# Patient Record
Sex: Female | Born: 1965 | Race: White | Hispanic: No | Marital: Single | Smoking: Never smoker
Health system: Southern US, Community
[De-identification: ages and names within clinical notes are randomized; demographics above are authoritative.]

## PROBLEM LIST (undated history)

## (undated) DIAGNOSIS — R519 Headache, unspecified: Secondary | ICD-10-CM

## (undated) DIAGNOSIS — G709 Myoneural disorder, unspecified: Secondary | ICD-10-CM

## (undated) DIAGNOSIS — G4733 Obstructive sleep apnea (adult) (pediatric): Secondary | ICD-10-CM

## (undated) DIAGNOSIS — R000 Tachycardia, unspecified: Secondary | ICD-10-CM

## (undated) DIAGNOSIS — IMO0001 Reserved for inherently not codable concepts without codable children: Secondary | ICD-10-CM

## (undated) DIAGNOSIS — F419 Anxiety disorder, unspecified: Secondary | ICD-10-CM

## (undated) DIAGNOSIS — Z8739 Personal history of other diseases of the musculoskeletal system and connective tissue: Secondary | ICD-10-CM

## (undated) DIAGNOSIS — T7840XA Allergy, unspecified, initial encounter: Secondary | ICD-10-CM

## (undated) DIAGNOSIS — K219 Gastro-esophageal reflux disease without esophagitis: Secondary | ICD-10-CM

## (undated) DIAGNOSIS — D689 Coagulation defect, unspecified: Secondary | ICD-10-CM

## (undated) DIAGNOSIS — F329 Major depressive disorder, single episode, unspecified: Secondary | ICD-10-CM

## (undated) DIAGNOSIS — M1990 Unspecified osteoarthritis, unspecified site: Secondary | ICD-10-CM

## (undated) DIAGNOSIS — E669 Obesity, unspecified: Secondary | ICD-10-CM

## (undated) DIAGNOSIS — F32A Depression, unspecified: Secondary | ICD-10-CM

## (undated) DIAGNOSIS — G4730 Sleep apnea, unspecified: Secondary | ICD-10-CM

## (undated) DIAGNOSIS — J189 Pneumonia, unspecified organism: Secondary | ICD-10-CM

## (undated) DIAGNOSIS — F319 Bipolar disorder, unspecified: Secondary | ICD-10-CM

## (undated) DIAGNOSIS — R739 Hyperglycemia, unspecified: Secondary | ICD-10-CM

## (undated) DIAGNOSIS — R7303 Prediabetes: Secondary | ICD-10-CM

## (undated) DIAGNOSIS — E119 Type 2 diabetes mellitus without complications: Secondary | ICD-10-CM

## (undated) DIAGNOSIS — R011 Cardiac murmur, unspecified: Secondary | ICD-10-CM

## (undated) DIAGNOSIS — E282 Polycystic ovarian syndrome: Secondary | ICD-10-CM

## (undated) DIAGNOSIS — D649 Anemia, unspecified: Secondary | ICD-10-CM

## (undated) DIAGNOSIS — R0789 Other chest pain: Principal | ICD-10-CM

## (undated) HISTORY — DX: Obesity, unspecified: E66.9

## (undated) HISTORY — PX: FOOT SURGERY: SHX648

## (undated) HISTORY — DX: Polycystic ovarian syndrome: E28.2

## (undated) HISTORY — DX: Reserved for inherently not codable concepts without codable children: IMO0001

## (undated) HISTORY — DX: Bipolar disorder, unspecified: F31.9

## (undated) HISTORY — DX: Personal history of other diseases of the musculoskeletal system and connective tissue: Z87.39

## (undated) HISTORY — DX: Obstructive sleep apnea (adult) (pediatric): G47.33

## (undated) HISTORY — PX: SPINAL FUSION: SHX223

## (undated) HISTORY — DX: Cardiac murmur, unspecified: R01.1

## (undated) HISTORY — DX: Sleep apnea, unspecified: G47.30

## (undated) HISTORY — DX: Depression, unspecified: F32.A

## (undated) HISTORY — DX: Coagulation defect, unspecified: D68.9

## (undated) HISTORY — DX: Essential (primary) hypertension: I10

## (undated) HISTORY — DX: Major depressive disorder, single episode, unspecified: F32.9

## (undated) HISTORY — DX: Unspecified asthma, uncomplicated: J45.909

## (undated) HISTORY — PX: COLONOSCOPY: SHX174

## (undated) HISTORY — DX: Tachycardia, unspecified: R00.0

## (undated) HISTORY — DX: Anemia, unspecified: D64.9

## (undated) HISTORY — DX: Other chest pain: R07.89

## (undated) HISTORY — DX: Hyperglycemia, unspecified: R73.9

## (undated) HISTORY — DX: Gastro-esophageal reflux disease without esophagitis: K21.9

## (undated) HISTORY — DX: Allergy, unspecified, initial encounter: T78.40XA

---

## 1994-07-07 HISTORY — PX: TONSILLECTOMY: SHX5217

## 1996-07-07 HISTORY — PX: OVARIAN CYST REMOVAL: SHX89

## 1999-11-21 ENCOUNTER — Other Ambulatory Visit: Admission: RE | Admit: 1999-11-21 | Discharge: 1999-11-21 | Payer: Self-pay | Admitting: Obstetrics & Gynecology

## 2002-07-07 HISTORY — PX: NASAL SINUS SURGERY: SHX719

## 2004-06-17 ENCOUNTER — Encounter: Admission: RE | Admit: 2004-06-17 | Discharge: 2004-06-17 | Payer: Self-pay | Admitting: Orthopaedic Surgery

## 2004-07-17 ENCOUNTER — Inpatient Hospital Stay (HOSPITAL_COMMUNITY): Admission: RE | Admit: 2004-07-17 | Discharge: 2004-07-23 | Payer: Self-pay | Admitting: Orthopaedic Surgery

## 2004-07-17 ENCOUNTER — Encounter: Payer: Self-pay | Admitting: Orthopaedic Surgery

## 2004-09-05 ENCOUNTER — Inpatient Hospital Stay (HOSPITAL_COMMUNITY): Admission: EM | Admit: 2004-09-05 | Discharge: 2004-09-11 | Payer: Self-pay | Admitting: Emergency Medicine

## 2004-09-26 ENCOUNTER — Ambulatory Visit (HOSPITAL_COMMUNITY): Admission: RE | Admit: 2004-09-26 | Discharge: 2004-09-26 | Payer: Self-pay | Admitting: Orthopaedic Surgery

## 2005-07-07 HISTORY — PX: KNEE SURGERY: SHX244

## 2006-09-17 ENCOUNTER — Ambulatory Visit (HOSPITAL_COMMUNITY): Admission: RE | Admit: 2006-09-17 | Discharge: 2006-09-18 | Payer: Self-pay | Admitting: Orthopaedic Surgery

## 2006-09-24 ENCOUNTER — Ambulatory Visit (HOSPITAL_COMMUNITY): Admission: RE | Admit: 2006-09-24 | Discharge: 2006-09-24 | Payer: Self-pay | Admitting: Orthopaedic Surgery

## 2006-11-20 ENCOUNTER — Encounter: Admission: RE | Admit: 2006-11-20 | Discharge: 2006-11-20 | Payer: Self-pay | Admitting: Family Medicine

## 2008-02-22 ENCOUNTER — Ambulatory Visit (HOSPITAL_COMMUNITY): Admission: RE | Admit: 2008-02-22 | Discharge: 2008-02-22 | Payer: Self-pay | Admitting: Anesthesiology

## 2008-03-07 ENCOUNTER — Encounter: Admission: RE | Admit: 2008-03-07 | Discharge: 2008-03-07 | Payer: Self-pay | Admitting: Family

## 2008-11-21 ENCOUNTER — Encounter: Admission: RE | Admit: 2008-11-21 | Discharge: 2008-11-21 | Payer: Self-pay | Admitting: Anesthesiology

## 2009-04-09 ENCOUNTER — Encounter: Admission: RE | Admit: 2009-04-09 | Discharge: 2009-04-09 | Payer: Self-pay | Admitting: Family Medicine

## 2010-01-09 ENCOUNTER — Encounter: Admission: RE | Admit: 2010-01-09 | Discharge: 2010-01-09 | Payer: Self-pay | Admitting: Anesthesiology

## 2010-05-28 ENCOUNTER — Encounter: Admission: RE | Admit: 2010-05-28 | Discharge: 2010-05-28 | Payer: Self-pay | Admitting: Family Medicine

## 2010-11-22 NOTE — Op Note (Signed)
NAMEHILDAGARDE, [name]              ACCOUNT NO.:  [id number]   MEDICAL RECORD NO.:  [id number]          PATIENT TYPE:  OIB   LOCATION:  5018                         FACILITY:  [facility]   PHYSICIAN:  Sharolyn Douglas, M.D.        DATE OF BIRTH:  02/16/1966   DATE OF PROCEDURE:  09/17/2006  DATE OF DISCHARGE:                               OPERATIVE REPORT   DIAGNOSES:  1. Lumbar degenerative disk disease.  2. Post laminectomy pain syndrome.  3. Back and bilateral lower extremity pain.   PROCEDURES:  1. T9-10 thoracic laminotomy for placement of permanent trial spinal      cord stimulator lead.  2. Programming of spinal cord stimulator lead.  3. Fluoroscopy used for placement of permanent trial spinal cord      stimulator lead.   SURGEON:  Sharolyn Douglas, MD.   ASSISTANTJill Side Mahar, PA   ANESTHESIA:  General endotracheal.   ESTIMATED BLOOD LOSS:  Minimal.   COMPLICATIONS:  None.   SPONGE AND NEEDLE COUNT:  Correct.   INDICATIONS:  The patient is a pleasant 45 year old female with chronic  disabling back and bilateral lower extremity pain.  She has failed all  attempts at other treatment measures and now presents for placement of  permanent trial spinal cord stimulator lead in hopes of improving her  symptoms.  This is stage one of a two-part staged procedure.   PROCEDURE:  After informed consent, she was taken to the operating room,  underwent general endotracheal anesthesia without difficulty, given  prophylactic IV antibiotics.  Carefully turned prone onto the Wilson  frame, all bony prominences padded, face and eyes protected at all  times.  Back prepped and draped, usual sterile fashion.  Fluoroscopy  brought into the field.  The T9-10 interspace was identified.  A 4 cm  incision was made over the interspace.  Dissection was carried through  the deep fascia, subperiosteal exposure carried out.  Deep retractors  placed.  Laminectomy was then completed at T9-10 by removing the  interspinous ligament.  The ligamentum flavum was removed piecemeal  using thin footed Kerrison punches.  The [employer] dual-paddle lead was  then passed into the spinal canal and slid proximally.  Fluoroscopy was  used to confirm good positioning in the midline with the tip at the  midportion of the T8 vertebral body.  We then tested impedance.  We  obtained adequate readings.  The anchor was then attached to the lead  using a 3-0 Prolene suture.  The lead was sutured to the interspinous  ligament of T9.  Extension leads were then placed and externalized out  towards the right shoulder using the tunneling tool.  The deep fascia  was closed with interrupted 0 Vicryl suture.  The remaining lead was  then placed into the incision and the skin was closed tightly using 3-0  nylon suture.  Sterile bandages were placed.  The patient was turned  supine, extubated without difficulty, and transferred to recovery in  stable condition.   We then programmed her stimulator along with the representative from  [employer] in the  recovery room, obtaining coverage in the back and  bilateral legs.  Further programming will be carried out in the hospital  before discharge.   It should be noted my assistant [facility], PA, was present  throughout the procedure including the positioning, the exposure, the  laminotomy, and also placement of the lead and wound closure.      Sharolyn Douglas, M.D.  Electronically Signed     MC/MEDQ  D:  09/17/2006  T:  09/19/2006  Job:  [id number]

## 2010-11-22 NOTE — Discharge Summary (Signed)
NAMESHAKESHA, [name]              ACCOUNT NO.:  [id number]   MEDICAL RECORD NO.:  [id number]          PATIENT TYPE:  INP   LOCATION:  5033                         FACILITY:  [facility]   PHYSICIAN:  Sharolyn Douglas, M.D.        DATE OF BIRTH:  1966/04/24   DATE OF ADMISSION:  07/17/2004  DATE OF DISCHARGE:  07/23/2004                                 DISCHARGE SUMMARY   ADMITTING DIAGNOSES:  1.  L4-L5, L5-S1 degenerative disk disease.  2.  Hypertension.  3.  Depression.  4.  Reflux.   DISCHARGE DIAGNOSES:  1.  Status post L4-S1 posterior spinal fusion, doing well.  2.  Postoperative anemia.  3.  Postoperative hyperglycemia.  4.  Hypertension.  5.  Depression.  6.  Reflux.   PROCEDURE:  On July 17, 2004 patient was taken to operating room for L4-  S1 posterior spinal fusion with pedicle screws TLIF BMP.  The surgeon was  Sharolyn Douglas, M.D.  Assistant was [employer], P.A.-C.  Anesthesia used was  general.   CONSULTS:  None.   LABORATORIES:  On July 15, 2004 CBC with differential was within normal  limits, postoperatively a hemoglobin and hematocrit were monitored daily,  she reached a low of 10.4 and 29.5 on July 19, 2004 however, she did not  require any treatment.  PT, INR and PTT on July 15, 2004 were within  normal limits.  Complete metabolic panel from July 15, 2004 was normal  with the exception of glucose slightly decreased at 69.  UA was negative  with the exception of many epithelial's.  Blood typing was type O, Rh type  negative, antibody screen negative.  Basic metabolic panel from July 18, 2004 was within normal limits with the exception of glucose at 129 and BUN  of 4.   CT of the lumbar spine on July 17, 2004 shows postoperative lumbar  fixation, does not demonstrate any complicating features with the pedicle  screw placement nor is there is any evidence of foraminal compromise or  postoperative complicating features.  Lumbar spine x-rays from July 17, 2004 shows L4-S1 posterior spinal fusion.  EKG from July 15, 2004 shows  normal sinus rhythm, minimal voltage criteria for LVH __________ variant, no  previous tracing, read by Lady Deutscher.   BRIEF HISTORY:  Patient is a 45 year old female who has had previous back  surgery at L5-S1 which was a microdiskectomy unfortunately had persistent  pain and severe low back pain and numbness into the right lower extremity.  She has been unable to work since that previous surgery.  Unfortunately she  has tried numerous types of conservative treatment since that time to  control her pain and this has not worked.  Because of her MRI findings as  well as her physical exam findings about her best course of management at  this point would be an L4-S1 posterior spinal fusion.  The risks and  benefits of this procedure were discussed with the patient by Dr. Noel Gerold as  well as myself and she indicated understanding and opts to proceed.  HOSPITAL COURSE:  On July 17, 2004 patient was taken to the operating  room for the above listed procedure.  She tolerated procedure well without  any intraoperative complications.  There was one Hemovac drained placed  intraoperatively with 600 mL of blood loss and 250 return via cell saver.  She was transferred to the recovery room in stable condition.  Postoperatively she did break with left lower extremity pain which her  preoperative right lower extremity pain was completely resolved.  She felt  she was having some weakness as well.  Tibialis anterior strength on the  left was 4/5, EHL strength was 4/5 while on the right lower extremity she  was 5/5.  She also had decreased sensation in the left lower extremity in  the L5 distribution.  Because of these findings a CT scan of the lumbar  spine was ordered to confirm screw placement.  This came back as previously  dictated without any foraminal compromise and they seem to be well placed.  By the following day  her left bilateral lower extremity pain had resolved  and her strength was slowly improving.  She was having back pain as  expected.   Postoperatively retained orthopedic spine protocol was followed including  holding her diet at n.p.o. until she passed flatus at which time she was  slowly advanced to a regular diet.  Incentive spirometer was used q.1h.  Hemovac drain was removed on postoperative day #2 without any difficulty.  Foley was kept in for 2 days as well and was removed on postoperative day  #2.  TED hose, SCD as well as early mobilization for DVT prophylaxis.  Physical therapy and occupational therapy were consulted to work with  patient on progressive ambulation program.  She was to use her LSO brace  when she is out of bed.  Forty eight hours prophylactic antibiotics,  morphine PCA as well as Vicodin as needed for pain, home medications were  restarted.   By postoperative day #2 she had progressed along and was feeling much  better.  Again, her leg pain continued to be resolved.  She was having  normal strength and function at that point.  Physical therapy and  occupational therapy worked with her on a daily basis.  By July 22, 2004  patient was doing very well and by July 23, 2004 she had met all  orthopedic goals, medically she was doing very well, she was stable and  ready for discharge home with home health physical therapy and occupational  therapy as well as her family's assistance.   DISCHARGE PLAN:  Patient a 45 year old female status post L4-S1 posterior  spinal fusion doing well.  Activity is daily ambulation, brace on when she  is up, no lifting heavier than 5 pounds, back precautions at all times,  daily dressing changes, may shower.  Follow up 2 weeks postoperatively with  Dr. Noel Gerold.  Diet resume home diet.  Medication is Vicodin p.r.n. pain, Robaxin p.r.n. muscle spasm, multivitamin daily, calcium daily, Colace twice  daily, laxative as needed and  resume home meds.  Diet regular home diet.   CONDITION ON DISCHARGE:  Stable, improved.   DISPOSITION:  Patient to be discharged to her home with the family  assistance as well as home health physical therapy and occupational therapy.      CM/MEDQ  D:  10/16/2004  T:  10/16/2004  Job:  [id number]

## 2010-11-22 NOTE — Op Note (Signed)
[name], [name]              ACCOUNT NO.:  [id number]   MEDICAL RECORD NO.:  [id number]          PATIENT TYPE:  INP   LOCATION:  2550                         FACILITY:  [facility]   PHYSICIAN:  Sharolyn Douglas, M.D.        DATE OF BIRTH:  04/30/1966   DATE OF PROCEDURE:  07/17/2004  DATE OF DISCHARGE:                                 OPERATIVE REPORT   DIAGNOSIS:  Lumbar degenerative disk disease L4-5, L5-S1 with concordant  pain and recurrent disk rupture.   PROCEDURE:  1.  Revision lumbar laminectomy, L5-S1 with decompression of the right L5      and S1 nerve roots.  2.  Transforaminal lumbar interbody fusion, L4-5 and L5-S1 with placement of      two PEAK cages packed with local autogenous bone graft and bone      morphogenic protein.  3.  Segmental pedicle screw instrumentation, L4 through S1 using the Spinal      Concepts system.  4.  Posterior spinal arthrodesis, L4 through S1.  5.  Local autogenous bone graft supplemented with bone morphogenic protein      and Grafton allograft.  6.  Neuro monitoring with testing of six pedicle screws, monitoring of free      running EMGs times 3 hours.   SURGEON:  Sharolyn Douglas, M.D.   ASSISTANT:  Verlin Fester, P.A.   ANESTHESIA:  General endotracheal.   COMPLICATIONS:  None.   INDICATIONS:  The patient is a very pleasant 45 year old female with chronic  disabling back and bilateral lower extremity pain and weakness. She had a  previous L5-S1 microdiskectomy. Postoperatively, she has had progressively  worsening back and bilateral leg pain, right greater than left. Diskography  demonstrated concordant pain response at L4-5 and L5-S1. MRI scan shows  recurrent disk rupture at L5-S1. Because of her severe persistent symptoms  unresponsive to all conservative care, she has elected to undergo lumbar  decompression and fusion L4 through S1. She knows the risks and benefits.   PROCEDURE:  The patient was properly identified in the holding area,  taken  to the operating room and under general orotracheal anesthesia without  difficulty, given prophylactic IV antibiotics. Carefully positioned prone on  the Wilson frame. All bony processes were padded. Face protected at all  times. Back prepped, draped usual sterile fashion. The previous midline  incision was utilized and extended several inches proximally. Dissection was  carried sharply to the deep fascia and previous scar. Paraspinal muscles  elevated out over the transverse processes of L4, L5 and sacral ala  bilaterally. The bone anatomy was defined. The previous laminotomy defects  were defined bilaterally at L5-S1. Care was taken not to inadvertently enter  the spinal canal. Deep retractors were placed. We then turned our attention  to performing a revision laminectomy at L5-S1. The thecal sac was  identified. The epidural fibrosis was carefully dissected. We identified the  right L5 and S1 nerve roots which were scarred and being displaced  posteriorly by a disk rupture. The thecal sac was gently mobilized and the  disk space entered. A subligamentous  disk herniation was then removed which  immediately allowed the thecal sac to become more mobile. There was  significant scar noted about the nerve roots bilaterally. We then turned our  attention to placing pedicle screws at L4, L5 and S1 bilaterally using  anatomic probing technique. Each pedicle starting point was identified  initiated with the awl. The pedicle was cannulated and then palpated to rule  out any breeches. The pedicle was tapped and then once again palpated. There  were no breeches.  We utilized 6.5 x 50 mm screws at L4, 6.5 x 45 mm screws  at L5 and 7.5 x 35 mm screws in the sacrum. We had good screw purchase after  placing each pedicle screw. The screws were stimulated using triggered EMGs  and there were no deleterious changes. We then turned our attention to  completing transforaminal lumbar interbody fusions  on the right at L4-5 and  L5-S1. Facetectomies were completed.  The exiting and traversing nerve roots  were identified and protected at all times.  Free running EMGs monitored no  deleterious changes.  Starting at L4-5, the disk space was entered. Radical  diskectomy carried out across the contralateral side, cartilaginous  endplates scraped . The disk was distracted. We packed the disk space with  local autogenous bone graft obtained from the laminectomy and facetectomy.  The BMP sponge from the medium InFuse kit was then pushed anteriorly. We  inserted a 12 mm peak cage packed with a BMP sponge in an oblique fashion  into the L4-5 interspace. We felt we had good distraction. We carried  similar procedure at L5-S1.  At this level the disk space was severely  collapsed. Wide foraminotomy completed. Again it was noted that the L5 and  S1 roots were scarred and neurolysis was completed. An 8 mm peak cage placed  at L5-S1. We then turned our attention to completing the posterior spinal  arthrodesis, L4 through S1 bilaterally.  High-speed bur used to decorticate  the transverse processes and remaining pars interarticularis. Remaining bone  graft was then packed into the lateral gutters and supplemented with 15 cc  of Grafton allograft.  Short segment titanium rods placed into the polyaxial  screw heads.  Gentle compression applied before shearing off the locking  caps. A cross connector was applied. Deep Hemovac drain left in place. The  deep fascia closed with a running #1 Vicryl suture. Subcutaneous layer  closed with 0 Vicryl, 2-0 Vicryl and then a running 3-0 subcuticular Vicryl  suture on the skin edges. Benzoin, Steri-Strips placed. Sterile dressing  applied. The patient was extubated without difficulty and transferred to  recovery in stable condition.      MC/MEDQ  D:  07/17/2004  T:  07/17/2004  Job:  [id number]

## 2010-11-22 NOTE — H&P (Signed)
[name], [name]              ACCOUNT NO.:  [id number]   MEDICAL RECORD NO.:  [id number]          PATIENT TYPE:  INP   LOCATION:  0481                         [facility]AC[facility]   PHYSICIAN:  Georges Lynch. Gioffre, M.D.DATE OF BIRTH:  08/21/1965   DATE OF ADMISSION:  09/04/2004  DATE OF DISCHARGE:                                HISTORY & PHYSICAL   HISTORY OF PRESENT ILLNESS:  I got called late last evening, midnight to see  Ms. [name] in regards to an injury she had when she fell and injured her  right knee.  Apparently, her left leg had been somewhat weak she says and  she recently has had spinal fusion done seven weeks ago and she fell and  injured her right knee.  She has also complained of some pain about the  right ankle.   PAST MEDICAL HISTORY:  All well documented.   ALLERGIES:  She is allergic to tape.   CURRENT MEDICATIONS:  1.  Nexium 40 mg daily.  2.  Trazodone 100 mg hours of sleep.  3.  Wellbutrin XL 300 mg daily.  4.  Ortho-Tri-Cyclen.   PAST SURGICAL HISTORY:  As mentioned above, spinal fusion some weeks ago.   PHYSICAL EXAMINATION:  GENERAL: Alert and oriented, temperature is 97.8,  pulse rate 84, respirations 20, blood pressure is 144/77.   HEENT: The exam of the head and neck were negative. Mouth was clear, no  lesions noted.   EXTREMITIES: Back as I mentioned she had a previous spinal fusion.  Her  upper extremities were negative.  Left lower extremity, she had no evidence  of any fractures or dislocation, circulation was intact.  Right lower  extremity had pain and swelling of the right knee.  Calf was fine, no  phlebitis.  She had a good dorsalis pedis pulse.  She had dorsiflexion and  plantar flexion and flexed her foot well on the right.  Sensation was  intact.   IMAGING STUDIES:  X-rays of her right knee showed a tibial plateau fracture,  nondisplaced, we did a CT scan of her knee that shows multiple small  fragments in regard to the tibial  plateau of the right knee.   LABORATORY DATA:  Her hemoglobin was 12.5, hematocrit was 35.7, white count  12,900, platelets were 349,000 which were normal.  Her sodium was 135,  potassium 3.8, chloride 104, BUN 0.07, creatinine 7, glucose 104.  Alk phos  was 68, total bilirubin was 0.06.   ASSESSMENT AND PLAN:  But I see no reason at this time to do any open  reductions at this time on her. I think we will give her a chance to heal  this conservatively. She is 38.  We are going to treat her in a knee  immobilizer and I am going to put her on Lovenox and she had to be admitted  for pain control.      RAG/MEDQ  D:  09/05/2004  T:  09/05/2004  Job:  [id number]   cc:   Sharolyn Douglas, M.D.  [address]  [place]  [place] [address]  Fax:  [phone] 

## 2010-11-22 NOTE — Op Note (Signed)
NAMEELANORE, [name]              ACCOUNT NO.:  [id number]   MEDICAL RECORD NO.:  [id number]          PATIENT TYPE:  AMB   LOCATION:  SDS                          FACILITY:  [facility]   PHYSICIAN:  Sharolyn Douglas, M.D.        DATE OF BIRTH:  [birth year]   DATE OF PROCEDURE:  09/24/2006  DATE OF DISCHARGE:                               OPERATIVE REPORT   Please note this is stage 2 a staged procedure.   DIAGNOSIS:  Post laminectomy pain syndrome.   PROCEDURE:  1. Placement of internal pulse generator, right hip, for permanent      spinal cord stimulator.  2. Complex programming of spinal cord stimulator.  3. Trigger point injection left shoulder.   SURGEON:  Sharolyn Douglas, M.D.   ASSISTANT:  Colleen Mahar, P.A.-C.   ANESTHESIA:  General endotracheal.   ESTIMATED BLOOD LOSS:  Minimal.   COMPLICATIONS:  None.   INDICATIONS:  The patient is a pleasant 45 year old female with chronic  pain related to post laminectomy syndrome.  She had a permanent trial  spinal cord stimulator lead placed last week with good results.  She has  noted excellent coverage in the back and bilateral lower extremities.  She wishes to move forward with placement of the IPG.  She has had  severe pain in her left posterior shoulder, this is tender to palpation.  It is very muscular in origin.  She has requested a trigger point  injection be done while she is under anesthesia.  The risks, benefits,  and alternatives were reviewed.  She has elected to proceed with the  above procedures.   DESCRIPTION OF PROCEDURE:  The patient was identified in the holding  area, after informed consent, taken to the operating room.  She  underwent general endotracheal anesthesia without difficulty and given  prophylactic IV antibiotics.  She was carefully turned prone onto the  Wilson frame.  All bony prominences were padded.  The face and eyes were  protected at all times.  The back was prepped and draped in the usual  sterile  fashion.  The previous midline thoracic incision was opened.  Care was taken not to damage the underlying stimulator lead.  The  extension leads were disconnected.  The leads were cut and the extension  lead was pulled from under the drapes out of the patient's body.  We  then made a separate incision located over the left hip in a transverse  fashion.  A subcutaneous pocket was developed.  A tunneling tool was  used to connect the two incisions. The stimulator leads were then passed  distally.  The distal end of the stimulator leads were then attached to  the IPG.  The IPG was tested and there was acceptable impedance.  The  leads were tightened using the screwdriver.  The IPG was then placed  into the subcutaneous pocket.  The two incisions were closed using 2-0  Vicryl suture as well as a running 3-0 subcuticular Vicryl suture on the  skin edges.  Dermabond was applied to the wounds.   We then turned our  attention to performing a trigger point injection for  the left shoulder.  In the holding area, we had marked the point of  maximum tenderness.  4 mL of 1% lidocaine and 40 mg of Depo-Medrol were  injected intramuscularly.  The patient tolerated the procedure well.  She was extubated without difficulty and transferred to recovery in  stable condition neurologically intact.  She noted in the recovery room  that her shoulder was feeling better.  We then proceeded to program her  spinal cord stimulator along with the representative from [employer].   It should be noted my assistant, [employer], P.A.-C., was present  throughout the procedure including the positioning, the exposure,  placement of the IPG, and also with wound closure.      Sharolyn Douglas, M.D.  Electronically Signed     MC/MEDQ  D:  09/24/2006  T:  09/24/2006  Job:  [id number]

## 2010-11-22 NOTE — Discharge Summary (Signed)
[name], [name]              ACCOUNT NO.:  [id number]   MEDICAL RECORD NO.:  [id number]          PATIENT TYPE:  INP   LOCATION:  0481                         FACILITY:  [facility]   PHYSICIAN:  Georges Lynch. Gioffre, M.D.DATE OF BIRTH:  [birth year]   DATE OF ADMISSION:  09/04/2004  DATE OF DISCHARGE:  09/11/2004                                 DISCHARGE SUMMARY   ADMISSION DIAGNOSES:  1.  Nondisplaced tibial plateau fracture status post spinal fusion seven      weeks ago.  2.  Gastroesophageal reflux disease.  3.  Chronic back pain.   DISCHARGE DIAGNOSES:  1.  Nondisplaced fracture tibial plateau right knee.  2.  Gastroesophageal reflux disease.  3.  Recent spinal fusion.   PROCEDURE:  None.   BRIEF HISTORY:  This is a 45 year old female who presented to the emergency  department after she sustained a fall injuring her right knee.  Apparently,  her left leg has been somewhat weak since her recent spinal fusion in  January 2006.  The weakness in her leg caused her to fall injuring her right  knee.  She was seen by the emergency room physician and was found to have a  nondisplaced tibial plateau fracture and Dr. Darrelyn [name] was called for  admission.  The patient was in such severe pain it was felt that she needed  to be hospitalized for pain control.  Her nondisplaced fracture did not  require surgery.   LABORATORY DATA:  Admission CBC:  WBC 12.9, RBC 4.33, hemoglobin 12.5,  hematocrit 35.7, platelet count 349.  Admission chemistry:  Sodium 135,  potassium 3.8, chloride 104, CO2 26, glucose 104, BUN 7, creatinine 0.7,  calcium 9.5, albumin 3.1.  Admission x-ray of right knee shows nondisplaced  tibial plateau fracture extending to the intra-articular surface.  X-ray of  her right hip, no acute findings.  X-ray of her back showed L4 to S1 spinal  fusion.  No acute findings.  X-ray of her left ankle negative.  CT scan of  her right knee revealed several fracture planes tibial plateau.   The patient  developed urinary tract symptoms while in the hospital.  Urinalysis was  obtained and culture which grew out greater than 100 colonies of  Enterococcus and E. coli.  She was placed on antibiotics.  The patient was  placed on PC analgesia for pain control.  The patient's pain was controlled  and she was gradually weaned to oral analgesics.  PT was consulted for gait  training ambulation.  [facility].  The  patient stabilized.  The pain was well controlled and it was felt that she  could be discharged home for outpatient followup.   Discharge date September 11, 2004.   DISCHARGE MEDICATIONS:  1.  Cipro 500 mg twice daily for one week.  2.  Lovenox 50 mg subcu daily.  3.  OxyContin 20 mg twice daily as needed for pain.  4.  Valium 5 mg one every eight hours as needed for spasms.  5.  Dilaudid 2 mg one every three hours as needed  for breakthrough pain.   WEIGHTBEARING:  The patient is to nonweightbear of her right lower  extremity.  She should wear the knee immobilizer as well as her back brace.  [facility] will administer her Lovenox.   FOLLOW UP:  The patient will follow up with Dr. Darrelyn [name] in one week in the  office.  She will call to schedule the appointment.      LKP/MEDQ  D:  10/08/2004  T:  10/08/2004  Job:  [id number]

## 2010-11-22 NOTE — H&P (Signed)
[name], [name]              ACCOUNT NO.:  [id number]   MEDICAL RECORD NO.:  [id number]          PATIENT TYPE:  INP   LOCATION:  NA                           FACILITY:  [facility]   PHYSICIAN:  Sharolyn Douglas, M.D.        DATE OF BIRTH:  Dec 11, 1965   DATE OF ADMISSION:  07/17/2004  DATE OF DISCHARGE:                                HISTORY & PHYSICAL   CHIEF COMPLAINT:  Severe back pain.   HISTORY OF PRESENT ILLNESS:  A 45 year old white female had previous surgery  at L5-S1 by another surgeon with a microdiskectomy.  Since then, she has had  persistent and severe low back pain with numbness in the right lower  extremity.  She has been unable to work since that surgery.  She is now  trying to get about by taking Vicodin, Robaxin, and trazodone.  Unfortunately, this has not helped her at all.  She is most uncomfortable  nearly all of the time, and certain positions markedly increase her  discomfort.   Patient moves very carefully during this examination.  She was tender to  palpation over the lumbar spine.  She has giveaway weakness in the right  lower extremity.  Reflexes of the knees are equal, but she has 2+ left ankle  reflex and absent in the right ankle.  Babinski's, clonus, and Hoffman are  negative.   MRI showed disk desiccation, degenerative disk disease at L4-5 and L5-S1.   After much discussion, including the pro's and con's of surgery as well as  the risks and benefits, it was decided to go ahead with surgical  intervention with an L4-S1 posterior spinal fusion with pedicle screws,  transforaminal lumbar interbody fusion, allograft, bone morphogenic protein.   PAST MEDICAL HISTORY:  This patient does have depression.  She also has  asthma with the last problem being about a year and a half ago.  She  currently has hypertension.  With extreme exertion, she has a little  shortness of breath.  She also has polycystic ovarian syndrome.   PAST SURGICAL HISTORY:  1.   Tonsillectomy in 1996.  2.  A cyst on the fallopian tube removed in 1998.  3.  Right knee had bilateral release with scraping in 2002 and the same done      with bilateral release in 2003 of the left knee.  4.  Nasal surgery for sinus in 2004.  5.  L5-S1 microdiskectomy in 2005 by Dr. Dareen Piano.   CURRENT MEDICATIONS:  1.  Wellbutrin XR 300 mg 1 daily.  2.  Hydrochlorothiazide 25 mg 1 daily.  3.  Trazodone 100 mg at bedtime.  4.  Nexium 40 mg 1 daily.  5.  Birth control pill.   She has no medical allergies.   FAMILY PHYSICIAN:  Dr. Arlyce Dice of [facility].   FAMILY HISTORY:  The patient's grandmother and grandfather have heart  disease.  Cancer in the family with father, grandmother, aunts and uncles on  her father's side, grandmother and great-grandmother on her mother's side.   SOCIAL HISTORY:  Patient is divorced.  She  is a Research officer, political party.  There is no intake of alcohol or tobacco products.  She has no children.  Her caregivers after surgery will be Jamesetta So and [employer].   REVIEW OF SYSTEMS:  CNS:  No seizures, paralysis, or double vision.  Patient  does have radiculitis, as mentioned above.  RESPIRATORY:  No productive  cough.  No hemoptysis.  CARDIOVASCULAR:  No chest pain.  No angina.  No  orthopnea.  GASTROINTESTINAL:  No nausea, vomiting, melena, or bloody  stools.  GENITOURINARY:  No discharge, dysuria, or hematuria.  MUSCULOSKELETAL:  Primarily in present illness.   PHYSICAL EXAMINATION:  VITAL SIGNS:  Blood pressure 104/72, pulse 74,  respirations 12.  GENERAL:  An alert, cooperative, friendly 45 year old white female.  HEENT:  Normocephalic.  PERRLA.  EOMs intact.  Oropharynx is clear.  NECK:  LUNGS:  Clear to auscultation with no rales or rhonchi.  HEART:  Regular rate and rhythm.  No murmurs are heard.  ABDOMEN:  Soft and nontender with the spleen not felt.  GENITALIA/RECTAL/PELVIC/BREASTS:  Not done, not pertinent for the present   illness.  EXTREMITIES:  As in present illness above.   ADMISSION DIAGNOSES:  1.  L4-L5, L5-S1 degenerative disk disease.  2.  Hypertension.  3.  Depression.  4.  Reflux.   PLAN:  The patient will undergone L4-S1 posterior spinal fusion with pedicle  screws, transforaminal lumbar interbody fusion, allograft, and bone  morphogenic protein.  Today she is fitted with an EVI lumbosacral orthosis,  which will be used postoperatively to protect her fusion.  This history and  physical was performed in our office on July 10, 2004.      DLU/MEDQ  D:  07/10/2004  T:  07/10/2004  Job:  [id number]   cc:   Teena Irani. Arlyce Dice, M.D.  [id number]  [place]  [place] [address]  Fax: [phone]

## 2010-11-22 NOTE — Op Note (Signed)
NAME:  [name], [name]              ACCOUNT NO.:  [id number]   MEDICAL RECORD NO.:  [id number]          PATIENT TYPE:  INP   LOCATION:  2550                         FACILITY:  [facility]   PHYSICIAN:  Max Cohen, M.D.        DATE OF BIRTH:  12/03/1965   DATE OF PROCEDURE:  07/17/2004  DATE OF DISCHARGE:                                 OPERATIVE REPORT   DIAGNOSIS:  Lumbar degenerative disk disease L4-5, L5-S1 with concordant  pain and recurrent disk rupture.   PROCEDURE:  1.  Revision lumbar laminectomy, L5-S1 with decompression of the right L5      and S1 nerve roots.  2.  Transforaminal lumbar interbody fusion, L4-5 and L5-S1 with placement of      two PEAK cages packed with local autogenous bone graft and bone      morphogenic protein.  3.  Segmental pedicle screw instrumentation, L4 through S1 using the Spinal      Concepts system.  4.  Posterior spinal arthrodesis, L4 through S1.  5.  Local autogenous bone graft supplemented with bone morphogenic protein      and Grafton allograft.  6.  Neuro monitoring with testing of six pedicle screws, monitoring of free      running EMGs times 3 hours.   SURGEON:  Max Cohen, M.D.   ASSISTANT:  Colleen Mahar, P.A.   ANESTHESIA:  General endotracheal.   COMPLICATIONS:  None.   INDICATIONS:  The patient is a very pleasant 45-year-old female with chronic  disabling back and bilateral lower extremity pain and weakness. She had a  previous L5-S1 microdiskectomy. Postoperatively, she has had progressively  worsening back and bilateral leg pain, right greater than left. Diskography  demonstrated concordant pain response at L4-5 and L5-S1. MRI scan shows  recurrent disk rupture at L5-S1. Because of her severe persistent symptoms  unresponsive to all conservative care, she has elected to undergo lumbar  decompression and fusion L4 through S1. She knows the risks and benefits.   PROCEDURE:  The patient was properly identified in the holding area,  taken  to the operating room and under general orotracheal anesthesia without  difficulty, given prophylactic IV antibiotics. Carefully positioned prone on  the Wilson frame. All bony processes were padded. Face protected at all  times. Back prepped, draped usual sterile fashion. The previous midline  incision was utilized and extended several inches proximally. Dissection was  carried sharply to the deep fascia and previous scar. Paraspinal muscles  elevated out over the transverse processes of L4, L5 and sacral ala  bilaterally. The bone anatomy was defined. The previous laminotomy defects  were defined bilaterally at L5-S1. Care was taken not to inadvertently enter  the spinal canal. Deep retractors were placed. We then turned our attention  to performing a revision laminectomy at L5-S1. The thecal sac was  identified. The epidural fibrosis was carefully dissected. We identified the  right L5 and S1 nerve roots which were scarred and being displaced  posteriorly by a disk rupture. The thecal sac was gently mobilized and the  disk space entered. A subligamentous   disk herniation was then removed which  immediately allowed the thecal sac to become more mobile. There was  significant scar noted about the nerve roots bilaterally. We then turned our  attention to placing pedicle screws at L4, L5 and S1 bilaterally using  anatomic probing technique. Each pedicle starting point was identified  initiated with the awl. The pedicle was cannulated and then palpated to rule  out any breeches. The pedicle was tapped and then once again palpated. There  were no breeches.  We utilized 6.5 x 50 mm screws at L4, 6.5 x 45 mm screws  at L5 and 7.5 x 35 mm screws in the sacrum. We had good screw purchase after  placing each pedicle screw. The screws were stimulated using triggered EMGs  and there were no deleterious changes. We then turned our attention to  completing transforaminal lumbar interbody fusions  on the right at L4-5 and  L5-S1. Facetectomies were completed.  The exiting and traversing nerve roots  were identified and protected at all times.  Free running EMGs monitored no  deleterious changes.  Starting at L4-5, the disk space was entered. Radical  diskectomy carried out across the contralateral side, cartilaginous  endplates scraped . The disk was distracted. We packed the disk space with  local autogenous bone graft obtained from the laminectomy and facetectomy.  The BMP sponge from the medium InFuse kit was then pushed anteriorly. We  inserted a 12 mm peak cage packed with a BMP sponge in an oblique fashion  into the L4-5 interspace. We felt we had good distraction. We carried  similar procedure at L5-S1.  At this level the disk space was severely  collapsed. Wide foraminotomy completed. Again it was noted that the L5 and  S1 roots were scarred and neurolysis was completed. An 8 mm peak cage placed  at L5-S1. We then turned our attention to completing the posterior spinal  arthrodesis, L4 through S1 bilaterally.  High-speed bur used to decorticate  the transverse processes and remaining pars interarticularis. Remaining bone  graft was then packed into the lateral gutters and supplemented with 15 cc  of Grafton allograft.  Short segment titanium rods placed into the polyaxial  screw heads.  Gentle compression applied before shearing off the locking  caps. A cross connector was applied. Deep Hemovac drain left in place. The  deep fascia closed with a running #1 Vicryl suture. Subcutaneous layer  closed with 0 Vicryl, 2-0 Vicryl and then a running 3-0 subcuticular Vicryl  suture on the skin edges. Benzoin, Steri-Strips placed. Sterile dressing  applied. The patient was extubated without difficulty and transferred to  recovery in stable condition.      MC/MEDQ  D:  07/17/2004  T:  07/17/2004  Job:  [id number] 

## 2011-04-16 ENCOUNTER — Other Ambulatory Visit: Payer: Self-pay | Admitting: Anesthesiology

## 2011-04-16 ENCOUNTER — Ambulatory Visit
Admission: RE | Admit: 2011-04-16 | Discharge: 2011-04-16 | Disposition: A | Discharge status: Home / Self Care | Payer: BC Managed Care – PPO | Source: Ambulatory Visit | Attending: Anesthesiology | Admitting: Anesthesiology

## 2011-04-16 DIAGNOSIS — M4320 Fusion of spine, site unspecified: Secondary | ICD-10-CM

## 2011-04-17 ENCOUNTER — Other Ambulatory Visit: Payer: Self-pay | Admitting: Physician Assistant

## 2011-04-17 DIAGNOSIS — M549 Dorsalgia, unspecified: Secondary | ICD-10-CM

## 2011-04-22 ENCOUNTER — Other Ambulatory Visit: Payer: Self-pay | Admitting: Family Medicine

## 2011-04-22 ENCOUNTER — Ambulatory Visit
Admission: RE | Admit: 2011-04-22 | Discharge: 2011-04-22 | Disposition: A | Discharge status: Home / Self Care | Payer: Medicare Other | Source: Ambulatory Visit | Attending: Physician Assistant | Admitting: Physician Assistant

## 2011-04-22 ENCOUNTER — Ambulatory Visit
Admission: RE | Admit: 2011-04-22 | Discharge: 2011-04-22 | Disposition: A | Discharge status: Home / Self Care | Payer: BC Managed Care – PPO | Source: Ambulatory Visit | Attending: Physician Assistant | Admitting: Physician Assistant

## 2011-04-22 DIAGNOSIS — M549 Dorsalgia, unspecified: Secondary | ICD-10-CM

## 2011-04-22 DIAGNOSIS — Z1231 Encounter for screening mammogram for malignant neoplasm of breast: Secondary | ICD-10-CM

## 2011-04-22 MED ORDER — IOHEXOL 180 MG/ML  SOLN
15.0000 mL | Freq: Once | INTRAMUSCULAR | Status: AC | PRN
  Administered 2011-04-22: 15 mL via INTRATHECAL

## 2011-04-22 MED ORDER — DIAZEPAM 5 MG PO TABS
10.0000 mg | ORAL_TABLET | Freq: Once | ORAL | Status: AC
  Administered 2011-04-22: 10 mg via ORAL

## 2011-04-22 NOTE — Progress Notes (Signed)
Resting quietly in nursing station after CT.  Denies pain at present.  jkl

## 2011-05-23 DIAGNOSIS — IMO0002 Reserved for concepts with insufficient information to code with codable children: Secondary | ICD-10-CM | POA: Insufficient documentation

## 2011-05-23 DIAGNOSIS — R1310 Dysphagia, unspecified: Secondary | ICD-10-CM | POA: Insufficient documentation

## 2011-05-23 DIAGNOSIS — M549 Dorsalgia, unspecified: Secondary | ICD-10-CM | POA: Insufficient documentation

## 2011-05-23 DIAGNOSIS — F3170 Bipolar disorder, currently in remission, most recent episode unspecified: Secondary | ICD-10-CM | POA: Insufficient documentation

## 2011-05-23 DIAGNOSIS — M1710 Unilateral primary osteoarthritis, unspecified knee: Secondary | ICD-10-CM | POA: Insufficient documentation

## 2011-05-23 DIAGNOSIS — K219 Gastro-esophageal reflux disease without esophagitis: Secondary | ICD-10-CM | POA: Insufficient documentation

## 2011-05-23 DIAGNOSIS — J45909 Unspecified asthma, uncomplicated: Secondary | ICD-10-CM | POA: Insufficient documentation

## 2011-05-23 DIAGNOSIS — J309 Allergic rhinitis, unspecified: Secondary | ICD-10-CM | POA: Insufficient documentation

## 2011-06-04 ENCOUNTER — Ambulatory Visit
Admission: RE | Admit: 2011-06-04 | Discharge: 2011-06-04 | Disposition: A | Discharge status: Home / Self Care | Payer: BC Managed Care – PPO | Source: Ambulatory Visit | Attending: Family Medicine | Admitting: Family Medicine

## 2011-06-04 ENCOUNTER — Other Ambulatory Visit: Payer: Self-pay | Admitting: Family Medicine

## 2011-06-04 DIAGNOSIS — R234 Changes in skin texture: Secondary | ICD-10-CM

## 2011-06-04 DIAGNOSIS — Z1231 Encounter for screening mammogram for malignant neoplasm of breast: Secondary | ICD-10-CM

## 2011-09-16 DIAGNOSIS — G479 Sleep disorder, unspecified: Secondary | ICD-10-CM | POA: Insufficient documentation

## 2011-10-23 ENCOUNTER — Ambulatory Visit
Admission: RE | Admit: 2011-10-23 | Discharge: 2011-10-23 | Disposition: A | Discharge status: Home / Self Care | Payer: BC Managed Care – PPO | Source: Ambulatory Visit | Attending: Physical Medicine and Rehabilitation | Admitting: Physical Medicine and Rehabilitation

## 2011-10-23 ENCOUNTER — Other Ambulatory Visit: Payer: Self-pay | Admitting: Physical Medicine and Rehabilitation

## 2011-10-23 DIAGNOSIS — R609 Edema, unspecified: Secondary | ICD-10-CM

## 2011-10-23 DIAGNOSIS — R52 Pain, unspecified: Secondary | ICD-10-CM

## 2011-11-10 DIAGNOSIS — R232 Flushing: Secondary | ICD-10-CM | POA: Insufficient documentation

## 2011-11-10 DIAGNOSIS — N951 Menopausal and female climacteric states: Secondary | ICD-10-CM | POA: Insufficient documentation

## 2012-02-04 DIAGNOSIS — G4733 Obstructive sleep apnea (adult) (pediatric): Secondary | ICD-10-CM | POA: Insufficient documentation

## 2012-06-15 ENCOUNTER — Institutional Professional Consult (permissible substitution): Payer: BC Managed Care – PPO | Admitting: Cardiology

## 2012-06-22 ENCOUNTER — Institutional Professional Consult (permissible substitution): Payer: BC Managed Care – PPO | Admitting: Cardiology

## 2012-07-14 ENCOUNTER — Ambulatory Visit (INDEPENDENT_AMBULATORY_CARE_PROVIDER_SITE_OTHER): Payer: Medicare Other | Admitting: Cardiology

## 2012-07-14 ENCOUNTER — Encounter: Payer: Self-pay | Admitting: Cardiology

## 2012-07-14 VITALS — BP 124/88 | HR 120 | Ht 67.0 in | Wt 289.0 lb

## 2012-07-14 DIAGNOSIS — I498 Other specified cardiac arrhythmias: Secondary | ICD-10-CM

## 2012-07-14 DIAGNOSIS — E282 Polycystic ovarian syndrome: Secondary | ICD-10-CM

## 2012-07-14 DIAGNOSIS — I10 Essential (primary) hypertension: Secondary | ICD-10-CM | POA: Insufficient documentation

## 2012-07-14 DIAGNOSIS — E669 Obesity, unspecified: Secondary | ICD-10-CM

## 2012-07-14 DIAGNOSIS — R000 Tachycardia, unspecified: Secondary | ICD-10-CM | POA: Insufficient documentation

## 2012-07-14 HISTORY — DX: Tachycardia, unspecified: R00.0

## 2012-07-14 HISTORY — DX: Obesity, unspecified: E66.9

## 2012-07-14 NOTE — Patient Instructions (Signed)
Continue your current medication for now.  We will schedule you for an Echocardiogram.  I will see you in 4 weeks

## 2012-07-15 NOTE — Progress Notes (Signed)
[name] Date of Birth: 21-Jul-1965 Medical Record #[id number]  History of Present Illness: [name] is seen today at the request of Dr. Dorothey Baseman for evaluation of tachycardia. She is a pleasant 47 year old white female who has a history of morbid obesity, bipolar disorder, and persistent tachycardia. She also has polycystic ovarian disease. She states that her heart rate always remained high it has been this way for many years. It ranges anywhere from 112-132 beats per minute. She denies any significant palpitations, dizziness, chest pain, or shortness of breath. She did have an unusual discomfort recently where she states it felt like someone was stabbing her in her ribs and this radiated to her left arm and jaw. She avoids caffeine and decongestants. Her tachycardia has been an issue that has limited her psychiatric medications. She has no history of QT prolongation. There is no family history of sudden death. She reports no formal cardiac evaluation the past. About 17-18 years ago she was on Fen/Phen therapy for one year. She apparently has never had an echocardiogram done.  Current Outpatient Prescriptions on File Prior to Visit  Medication Sig Dispense Refill  . albuterol (PROVENTIL HFA;VENTOLIN HFA) 108 (90 BASE) MCG/ACT inhaler Inhale 2 puffs into the lungs every 6 (six) hours as needed.      . ARIPiprazole (ABILIFY) 5 MG tablet Take 5 mg by mouth 2 (two) times daily.      . beclomethasone (QVAR) 80 MCG/ACT inhaler Inhale 1 puff into the lungs 2 (two) times daily.      . drospirenone-ethinyl estradiol (YAZ,GIANVI,LORYNA) 3-0.02 MG tablet Take 1 tablet by mouth daily.      . fluticasone (FLONASE) 50 MCG/ACT nasal spray Place 1 spray into the nose daily.      Marland Kitchen lisinopril-hydrochlorothiazide (PRINZIDE,ZESTORETIC) 10-12.5 MG per tablet Take 1 tablet by mouth daily.      . metFORMIN (GLUCOPHAGE) 1000 MG tablet Take 1,000 mg by mouth 2 (two) times daily with a meal.      . nortriptyline  (PAMELOR) 75 MG capsule Take 75 mg by mouth at bedtime.      Marland Kitchen omeprazole (PRILOSEC) 20 MG capsule Take 20 mg by mouth daily.      . temazepam (RESTORIL) 15 MG capsule Take 15 mg by mouth at bedtime as needed.      . traZODone (DESYREL) 150 MG tablet Take 150 mg by mouth at bedtime.        Allergies  Allergen Reactions  . Erythromycin Swelling  . Gabapentin Nausea And Vomiting  . Latex Rash  . Pregabalin Swelling  . Neuromuscular Blocking Agents     Past Medical History  Diagnosis Date  . H/O degenerative disc disease     L4-L5, L5-S1  . Hypertension   . Depression   . Reflux   . Postoperative anemia   . Hyperglycemia     Postoperative hyperglycemia  . Asthma     History of Asthma  . Polycystic disease, ovaries   . Bipolar disorder   . OSA (obstructive sleep apnea)     mild  . Sinus tachycardia 07/14/2012  . Obesity 07/14/2012    Past Surgical History  Procedure Date  . Spinal fusion   . Tonsillectomy 1996  . Ovarian cyst removal 1998    A cyst on the fallopian tube removed  . Nasal sinus surgery 2004    History  Smoking status  . Never Smoker   Smokeless tobacco  . Not on file    History  Alcohol  Use No    Family History  Problem Relation Age of Onset  . Cancer Father   . Cancer Maternal Grandmother   . Cancer Paternal Grandmother     Review of Systems: The review of systems is positive for weight gain this past year. She reports she is scheduled for a sleep study in the near future.  All other systems were reviewed and are negative.  Physical Exam: BP 124/88  Pulse 120  Ht 5\' 7"  (1.702 m)  Wt 289 lb (131.09 kg)  BMI 45.26 kg/m2 She is a pleasant young white female in no acute distress. HEENT: Normocephalic, atraumatic. Pupils are equal round and reactive to light accommodation. Extraocular movements are full. Oropharynx is clear with good dentition. Neck is supple without adenopathy, JVD, or thyromegaly. There no bruits. Breasts:  Pendulous. Lungs: Clear Cardiovascular: Regular, tachycardic rhythm without gallop, murmur, or click. Abdomen: Obese, soft, nontender. No masses or bruits. No hepatosplenomegaly. Extremities: No cyanosis, clubbing, or edema. Pedal pulses are 2+ and symmetric. Skin: Warm and dry. Neuro: Cranial nerves II through XII are intact. She is alert. Gait is normal. No focal motor or sensory deficits. LABORATORY DATA: ECG today demonstrates sinus tachycardia with a rate of 120 beats per minute. There is moderate voltage criteria for LVH. There is poor R wave progression across the anterior precordium consistent with her body habitus.  Assessment / Plan: 1. Sinus tachycardia. Patient is asymptomatic. This has been of long-standing duration. Metabolic workup has been negative including chemistries, CBC, thyroid studies, and catecholamine levels. Patient does not use stimulants. While some of her psychotropic medications may be implicated most of these are more concerning for QT prolongation and in fact her QT interval is normal. I have recommended an echocardiogram to rule out structural heart disease. If this is negative then we may want to consider beta blocker therapy to lower her heart rate perhaps in lieu of her current antihypertensive therapy. I think that also be beneficial for her to participate are regular aerobic exercise program to try and improve her conditioning and encouraged her efforts at weight loss.  2. Morbid obesity.  3. Hypertension.   4. Polycystic ovary disease  5. Bipolar disorder.

## 2012-07-22 ENCOUNTER — Ambulatory Visit (HOSPITAL_COMMUNITY): Payer: Medicare Other | Attending: Cardiology

## 2012-07-22 DIAGNOSIS — I10 Essential (primary) hypertension: Secondary | ICD-10-CM

## 2012-07-22 DIAGNOSIS — R000 Tachycardia, unspecified: Secondary | ICD-10-CM

## 2012-07-22 DIAGNOSIS — E282 Polycystic ovarian syndrome: Secondary | ICD-10-CM

## 2012-07-22 DIAGNOSIS — E669 Obesity, unspecified: Secondary | ICD-10-CM

## 2012-07-22 DIAGNOSIS — I517 Cardiomegaly: Secondary | ICD-10-CM

## 2012-07-22 DIAGNOSIS — I369 Nonrheumatic tricuspid valve disorder, unspecified: Secondary | ICD-10-CM | POA: Insufficient documentation

## 2012-07-22 NOTE — Progress Notes (Signed)
Echocardiogram performed.  

## 2012-07-26 ENCOUNTER — Other Ambulatory Visit: Payer: Self-pay

## 2012-07-26 ENCOUNTER — Telehealth: Payer: Self-pay | Admitting: Cardiology

## 2012-07-26 MED ORDER — BISOPROLOL FUMARATE 5 MG PO TABS: ORAL_TABLET | ORAL | Status: DC

## 2012-07-26 NOTE — Telephone Encounter (Signed)
Pt rtn call from friday re echo results

## 2012-07-26 NOTE — Telephone Encounter (Signed)
Patient called echo results given. 

## 2012-07-30 ENCOUNTER — Telehealth: Payer: Self-pay | Admitting: Cardiology

## 2012-07-30 MED ORDER — BISOPROLOL FUMARATE 5 MG PO TABS: 5.0000 mg | ORAL_TABLET | Freq: Every day | ORAL | Status: DC

## 2012-07-30 NOTE — Telephone Encounter (Signed)
New problem:   C/O taken beta blocker x 4 days .164 /97 . Day before 155/89.

## 2012-07-30 NOTE — Telephone Encounter (Signed)
Patient called stated her B/P has been elevated.States B/P today 164/97.B/P this week 149/76,155/89.States she is taking zebeta 5 mg 1/2 daily,lisinopril/hctz 10/25 mg daily.Spoke to Dr.Jordan he advised to increase zebeta 5 mg daily.Advised to monitor B/P and keep appointment with Dr.Jordan 08/18/12.

## 2012-08-06 ENCOUNTER — Telehealth: Payer: Self-pay | Admitting: Cardiology

## 2012-08-06 MED ORDER — BISOPROLOL FUMARATE 5 MG PO TABS: 5.0000 mg | ORAL_TABLET | Freq: Every day | ORAL | Status: DC

## 2012-08-06 NOTE — Telephone Encounter (Signed)
New problem:   [facility] [address] [address] in [place]   zebeta  5 mg.

## 2012-08-18 ENCOUNTER — Ambulatory Visit: Payer: 59 | Admitting: Cardiology

## 2012-08-18 ENCOUNTER — Telehealth: Payer: Self-pay | Admitting: Cardiology

## 2012-08-18 NOTE — Telephone Encounter (Signed)
Per pt call she has to cxancel due to weather but she needs to be seen because of new medication and she needs a sooner appt than next avail per b/p was 137/101 pulse 94

## 2012-08-18 NOTE — Telephone Encounter (Signed)
Unable to schedule pt this week due to the weather.  She is unable to come in next week.  She is scheduled to see Norma Fredrickson on 08/30/12.  She agrees with this appt.

## 2012-08-30 ENCOUNTER — Encounter: Payer: Self-pay | Admitting: Nurse Practitioner

## 2012-08-30 ENCOUNTER — Ambulatory Visit (INDEPENDENT_AMBULATORY_CARE_PROVIDER_SITE_OTHER): Payer: Medicare Other | Admitting: Nurse Practitioner

## 2012-08-30 VITALS — BP 106/64 | HR 92 | Resp 12 | Ht 67.0 in | Wt 285.8 lb

## 2012-08-30 DIAGNOSIS — I498 Other specified cardiac arrhythmias: Secondary | ICD-10-CM

## 2012-08-30 DIAGNOSIS — R000 Tachycardia, unspecified: Secondary | ICD-10-CM

## 2012-08-30 DIAGNOSIS — I10 Essential (primary) hypertension: Secondary | ICD-10-CM

## 2012-08-30 NOTE — Patient Instructions (Addendum)
Stay on your current medicines - but try the Bisoprolol at night time  Get your BP cuff checked or replaced (Omron)  See Dr. [place] in 3 months  Call the [facility] office at [phone] if you have any questions, problems or concerns.

## 2012-08-30 NOTE — Progress Notes (Signed)
[name] Date of Birth: [birth year] Medical Record #[id number]  History of Present Illness: [name] is seen back today for a follow up visit. She is seen for Dr. [place]. She has multiple medical issues which include morbid obesity, bipolar disorder and persistent tachycardia. She also has PCOS. Her tachycardia has been an issue to the point where it has limited her psychiatric medications. No history of prolonged QT syndrome. No sudden death in her family history. Has been on Fen/Phen in the remote past. She does not use stimulants.   She was here in January. Echo was ordered with plans to try and start beta blocker therapy for her tachycardia and BP. Her EF was normal. Low dose Bisoprolol was started. She called at the latter part of January with increased BP readings. Bisoprolol was increased to 5 mg at that time.   She comes back today. She is here alone. She is doing ok. Still notes some heart pounding in the mornings after she gets up. Taking the Zebeta 1/2 in the am and 1/2 in the pm. BP at home is high. Her cuff is old. Cuff size is regular. Never checked. No chest pain. Heart rate has improved. She has had her sleep medicines changed around.    Current Outpatient Prescriptions on File Prior to Visit  Medication Sig Dispense Refill  . albuterol (PROVENTIL HFA;VENTOLIN HFA) 108 (90 BASE) MCG/ACT inhaler Inhale 2 puffs into the lungs every 6 (six) hours as needed.      . ALPRAZolam (XANAX) 1 MG tablet Take 1 mg by mouth 3 (three) times daily.      Marland Kitchen BACLOFEN PO Take by mouth 3 (three) times daily.      . beclomethasone (QVAR) 80 MCG/ACT inhaler Inhale 1 puff into the lungs 2 (two) times daily.      . bisoprolol (ZEBETA) 5 MG tablet Take 1 tablet (5 mg total) by mouth daily.  30 tablet  6  . camphor-menthol (SARNA) lotion Apply topically as needed.      . drospirenone-ethinyl estradiol (YAZ,GIANVI,LORYNA) 3-0.02 MG tablet Take 1 tablet by mouth daily.      . fluticasone (FLONASE) 50  MCG/ACT nasal spray Place 1 spray into the nose daily.      Marland Kitchen lisinopril-hydrochlorothiazide (PRINZIDE,ZESTORETIC) 10-12.5 MG per tablet Take 1 tablet by mouth daily.      . Menthol, Topical Analgesic, (BIOFREEZE EX) Apply topically as needed.      . metFORMIN (GLUCOPHAGE) 1000 MG tablet Take 1,000 mg by mouth 2 (two) times daily with a meal.      . methadone (DOLOPHINE) 10 MG tablet Take 10 mg by mouth every 6 (six) hours as needed.      . nortriptyline (PAMELOR) 75 MG capsule Take 75 mg by mouth at bedtime.      Marland Kitchen omeprazole (PRILOSEC) 20 MG capsule Take 20 mg by mouth daily.      . traZODone (DESYREL) 150 MG tablet Take 300 mg by mouth at bedtime.       . ARIPiprazole (ABILIFY) 5 MG tablet Take 5 mg by mouth 2 (two) times daily.      . temazepam (RESTORIL) 15 MG capsule Take 15 mg by mouth at bedtime as needed.       No current facility-administered medications on file prior to visit.    Allergies  Allergen Reactions  . Erythromycin Swelling  . Gabapentin Nausea And Vomiting  . Latex Rash  . Pregabalin Swelling  . Neuromuscular Blocking Agents  Past Medical History  Diagnosis Date  . H/O degenerative disc disease     L4-L5, L5-S1  . Hypertension   . Depression   . Reflux   . Postoperative anemia   . Hyperglycemia     Postoperative hyperglycemia  . Asthma     History of Asthma  . Polycystic disease, ovaries   . Bipolar disorder   . OSA (obstructive sleep apnea)     mild  . Sinus tachycardia 07/14/2012  . Obesity 07/14/2012    Past Surgical History  Procedure Laterality Date  . Spinal fusion    . Tonsillectomy  1996  . Ovarian cyst removal  1998    A cyst on the fallopian tube removed  . Nasal sinus surgery  2004    History  Smoking status  . Never Smoker   Smokeless tobacco  . Not on file    History  Alcohol Use No    Family History  Problem Relation Age of Onset  . Cancer Father   . Cancer Maternal Grandmother   . Cancer Paternal Grandmother      Review of Systems: The review of systems is per the HPI.  All other systems were reviewed and are negative.  Physical Exam: BP 106/64  Pulse 92  Resp 12  Ht 5\' 7"  (1.702 m)  Wt 285 lb 12 oz (129.615 kg)  BMI 44.74 kg/m2  SpO2 97% BP by me with the large cuff is 110/70.  Patient is very pleasant and in no acute distress. She is morbidly obese. Skin is warm and dry. Color is normal.  HEENT is unremarkable. Normocephalic/atraumatic. PERRL. Sclera are nonicteric. Neck is supple. No masses. No JVD. Lungs are clear. Cardiac exam shows a regular rate and rhythm. Abdomen is soft. Extremities are without edema. Gait and ROM are intact. No gross neurologic deficits noted.  LABORATORY DATA: No results found for this basename: WBC,  HGB,  HCT,  PLT,  GLUCOSE,  CHOL,  TRIG,  HDL,  LDLDIRECT,  LDLCALC,  ALT,  AST,  NA,  K,  CL,  CREATININE,  BUN,  CO2,  TSH,  PSA,  INR,  GLUF,  HGBA1C,  MICROALBUR   Echo Study Conclusions from January 2014  - Left ventricle: The cavity size was normal. Wall thickness was normal. Systolic function was normal. The estimated ejection fraction was in the range of 50% to 55%. Wall motion was normal; there were no regional wall motion abnormalities. - Left atrium: The atrium was mildly dilated.  Assessment / Plan: 1. HTN - BP looks fine by [place] here today. I do not think she needs any more medicine at this time. Does need her BP cuff checked and needs the correct cuff size.   2. Tachycardia - HR is down to the 90's. She still has some "pounding" in the early morning. She is going to take whole Zebeta at bedtime.   3. Morbid obesity   We will see her back in 3 months. For now, no change in her medicines.   Patient is agreeable to this plan and will call if any problems develop in the interim.

## 2012-08-31 ENCOUNTER — Ambulatory Visit: Payer: 59 | Admitting: Cardiology

## 2012-11-30 ENCOUNTER — Encounter: Payer: Self-pay | Admitting: Cardiology

## 2012-11-30 ENCOUNTER — Ambulatory Visit (INDEPENDENT_AMBULATORY_CARE_PROVIDER_SITE_OTHER): Payer: Medicare Other | Admitting: Cardiology

## 2012-11-30 VITALS — BP 112/70 | HR 85 | Ht 67.0 in | Wt 279.6 lb

## 2012-11-30 DIAGNOSIS — R000 Tachycardia, unspecified: Secondary | ICD-10-CM

## 2012-11-30 DIAGNOSIS — R0789 Other chest pain: Secondary | ICD-10-CM

## 2012-11-30 DIAGNOSIS — I10 Essential (primary) hypertension: Secondary | ICD-10-CM

## 2012-11-30 DIAGNOSIS — E282 Polycystic ovarian syndrome: Secondary | ICD-10-CM

## 2012-11-30 DIAGNOSIS — I498 Other specified cardiac arrhythmias: Secondary | ICD-10-CM

## 2012-11-30 HISTORY — DX: Other chest pain: R07.89

## 2012-11-30 MED ORDER — BISOPROLOL FUMARATE 5 MG PO TABS: 5.0000 mg | ORAL_TABLET | Freq: Every day | ORAL | Status: DC

## 2012-11-30 NOTE — Patient Instructions (Addendum)
We will schedule you for a nuclear stress test.  Continue your current medication   

## 2012-11-30 NOTE — Progress Notes (Signed)
[name] Date of Birth: 11/17/1965 Medical Record #[id number]  History of Present Illness: [name] is seen back today for a follow up visit.  She has multiple medical issues which include morbid obesity, bipolar disorder and persistent tachycardia. She also has PCOS and hypertension. Her tachycardia has been an issue to the point where it has limited her psychiatric medications.  She does not use stimulants.  Her tachycardia has been managed with beta blocker therapy and she is currently taking bisoprolol 5 mg daily. Over the past month she has complained of left upper sternal chest pain sometimes radiating beneath the right breast. She also complains of left jaw pain. She has had for 5 episodes of this typically lasted about 45 minutes. She has to live her left side for to stop. It typically occurs in the late afternoon and is not exertional. Recently she has had problems with plantar fasciitis in her left foot has had some type of procedure by her podiatrist. This has limited her walking ability. She does have active asthma symptoms and uses inhalers daily.   Current Outpatient Prescriptions on File Prior to Visit  Medication Sig Dispense Refill  . albuterol (PROVENTIL HFA;VENTOLIN HFA) 108 (90 BASE) MCG/ACT inhaler Inhale 2 puffs into the lungs every 6 (six) hours as needed.      . ALPRAZolam (XANAX) 1 MG tablet Take 1 mg by mouth 3 (three) times daily.      Marland Kitchen BACLOFEN PO Take by mouth 3 (three) times daily.      . beclomethasone (QVAR) 80 MCG/ACT inhaler Inhale 3 puffs into the lungs 2 (two) times daily.       . camphor-menthol (SARNA) lotion Apply topically as needed.      . drospirenone-ethinyl estradiol (YAZ,GIANVI,LORYNA) 3-0.02 MG tablet Take 1 tablet by mouth daily.      . fluticasone (FLONASE) 50 MCG/ACT nasal spray Place 1 spray into the nose daily.      Marland Kitchen lisinopril-hydrochlorothiazide (PRINZIDE,ZESTORETIC) 10-12.5 MG per tablet Take 1 tablet by mouth daily.      . Menthol,  Topical Analgesic, (BIOFREEZE EX) Apply topically as needed.      . metFORMIN (GLUCOPHAGE) 1000 MG tablet Take 1,000 mg by mouth 2 (two) times daily with a meal.      . methadone (DOLOPHINE) 10 MG tablet Take 10 mg by mouth every 6 (six) hours as needed.      . nortriptyline (PAMELOR) 75 MG capsule Take 50 mg by mouth at bedtime.       Marland Kitchen omeprazole (PRILOSEC) 20 MG capsule Take 20 mg by mouth daily.      . QUEtiapine (SEROQUEL) 50 MG tablet Take 50 mg by mouth at bedtime.      . temazepam (RESTORIL) 15 MG capsule Take 15 mg by mouth at bedtime as needed.      . traZODone (DESYREL) 150 MG tablet Take 100 mg by mouth at bedtime.        No current facility-administered medications on file prior to visit.    Allergies  Allergen Reactions  . Erythromycin Swelling  . Gabapentin Nausea And Vomiting  . Latex Rash  . Pregabalin Swelling  . Neuromuscular Blocking Agents     Past Medical History  Diagnosis Date  . H/O degenerative disc disease     L4-L5, L5-S1  . Hypertension   . Depression   . Reflux   . Postoperative anemia   . Hyperglycemia     Postoperative hyperglycemia  . Asthma  History of Asthma  . Polycystic disease, ovaries   . Bipolar disorder   . OSA (obstructive sleep apnea)     mild  . Sinus tachycardia 07/14/2012  . Obesity 07/14/2012  . Chest pain, atypical 11/30/2012    Past Surgical History  Procedure Laterality Date  . Spinal fusion    . Tonsillectomy  1996  . Ovarian cyst removal  1998    A cyst on the fallopian tube removed  . Nasal sinus surgery  2004    History  Smoking status  . Never Smoker   Smokeless tobacco  . Not on file    History  Alcohol Use No    Family History  Problem Relation Age of Onset  . Cancer Father   . Cancer Maternal Grandmother   . Cancer Paternal Grandmother     Review of Systems: The review of systems is per the HPI.  All other systems were reviewed and are negative.  Physical Exam: BP 112/70  Pulse 85  Ht 5'  7" (1.702 m)  Wt 279 lb 9.6 oz (126.826 kg)  BMI 43.78 kg/m2  SpO2 97% Patient is very pleasant and in no acute distress. She is morbidly obese. Skin is warm and dry. Color is normal.  HEENT is unremarkable. Normocephalic/atraumatic. PERRL. Sclera are nonicteric. Neck is supple. No masses. No JVD. Lungs are clear. Cardiac exam shows a regular rate and rhythm. Abdomen is soft. Extremities are without edema. Gait and ROM are intact. No gross neurologic deficits noted.  LABORATORY DATA: ECG today demonstrates normal sinus rhythm with a normal ECG. Rate is 79 beats per minute.   Assessment / Plan: 1. HTN - BP is well controlled today.  2. Tachycardia - HR is well controlled on bisoprolol.  3. Morbid obesity   4. Chest pain with atypical features. She does have cardiac risk factors of hypertension, obesity, polycystic ovarian disease, and family history of coronary disease. I recommended stress testing and given her physical limitations will need to do a pharmacologic nuclear study.

## 2012-12-08 ENCOUNTER — Ambulatory Visit (HOSPITAL_COMMUNITY): Payer: Medicare Other | Attending: Cardiology | Admitting: Radiology

## 2012-12-08 VITALS — BP 112/69 | Ht 67.0 in | Wt 272.0 lb

## 2012-12-08 DIAGNOSIS — R0989 Other specified symptoms and signs involving the circulatory and respiratory systems: Secondary | ICD-10-CM | POA: Insufficient documentation

## 2012-12-08 DIAGNOSIS — R5383 Other fatigue: Secondary | ICD-10-CM | POA: Insufficient documentation

## 2012-12-08 DIAGNOSIS — R079 Chest pain, unspecified: Secondary | ICD-10-CM

## 2012-12-08 DIAGNOSIS — R0609 Other forms of dyspnea: Secondary | ICD-10-CM | POA: Insufficient documentation

## 2012-12-08 DIAGNOSIS — R61 Generalized hyperhidrosis: Secondary | ICD-10-CM | POA: Insufficient documentation

## 2012-12-08 DIAGNOSIS — E282 Polycystic ovarian syndrome: Secondary | ICD-10-CM

## 2012-12-08 DIAGNOSIS — R002 Palpitations: Secondary | ICD-10-CM | POA: Insufficient documentation

## 2012-12-08 DIAGNOSIS — R55 Syncope and collapse: Secondary | ICD-10-CM | POA: Insufficient documentation

## 2012-12-08 DIAGNOSIS — R0602 Shortness of breath: Secondary | ICD-10-CM

## 2012-12-08 DIAGNOSIS — R42 Dizziness and giddiness: Secondary | ICD-10-CM | POA: Insufficient documentation

## 2012-12-08 DIAGNOSIS — Z8249 Family history of ischemic heart disease and other diseases of the circulatory system: Secondary | ICD-10-CM | POA: Insufficient documentation

## 2012-12-08 DIAGNOSIS — R5381 Other malaise: Secondary | ICD-10-CM | POA: Insufficient documentation

## 2012-12-08 DIAGNOSIS — R0789 Other chest pain: Secondary | ICD-10-CM

## 2012-12-08 DIAGNOSIS — R000 Tachycardia, unspecified: Secondary | ICD-10-CM | POA: Insufficient documentation

## 2012-12-08 DIAGNOSIS — I10 Essential (primary) hypertension: Secondary | ICD-10-CM | POA: Insufficient documentation

## 2012-12-08 DIAGNOSIS — E669 Obesity, unspecified: Secondary | ICD-10-CM | POA: Insufficient documentation

## 2012-12-08 MED ORDER — TECHNETIUM TC 99M SESTAMIBI GENERIC - CARDIOLITE
33.0000 | Freq: Once | INTRAVENOUS | Status: AC | PRN
  Administered 2012-12-08: 33 via INTRAVENOUS

## 2012-12-08 NOTE — Progress Notes (Signed)
[facility] SITE 3 NUCLEAR MED [address] [place], [place] [address] [phone]    Cardiology Nuclear Med Study  [name] is a 47 y.o. female     MRN : [id number]     DOB: 18-Jun-1966  Procedure Date: 12/09/2012  Nuclear Med Background Indication for Stress Test:  Evaluation for Ischemia History:  No prior known history of CAD, Asthma, 07-22-12 Echo: EF=50-55% Cardiac Risk Factors: Family History - CAD, Hypertension and Obesity  Symptoms: Chest pain with/without exertion (last occurrence 3 weeks ago),  Diaphoresis, Dizziness, DOE, Fatigue, Fatigue with Exertion, Near Syncope, Palpitations, Rapid HR and SOB   Nuclear Pre-Procedure Caffeine/Decaff Intake:  None > 12 hrs NPO After: 5:30pm  Lungs:  clear O2 Sat: 97-98% on room air. IV 0.9% NS with Angio Cath:  22g  IV Site: R Antecubital x 1, tolerated well IV Started by:  Irean Hong, RN  Chest Size (in):  46 Cup Size: C  Height: 5\' 7"  (1.702 m)  Weight:  272 lb (123.378 kg)  BMI:  Body mass index is 42.59 kg/(m^2). Tech Comments:  Bisoprolol taken last night. Patient's was hypotensive on day of stress myoview and after 400 cc normal saline continued to be hypotensive, cancel test per Dr. Shirlee Latch and talk with Dr. [place].  Dr. Sw[place]and on vacation this week, so discussed with Norma Fredrickson, NP and she said to do Resting images today and have patient hold all BP med's and methadone in am and do Stress image tomorrow.  12-09-12 IV 22G (R) AC by Stanton Kidney, EMT-P.Last dose Bisoprolol, Methadone 11-07-12 Patsy edwards, RN.     Nuclear Med Study 1 or 2 day study: 2 day  Stress Test Type:  Lexiscan  Reading MD: Dietrich Pates, MD  Order Authorizing Provider:  Peter [place], MD  Resting Radionuclide: Technetium 40m Sestamibi  Resting Radionuclide Dose: 33.0 mCi on 12/08/12   Stress Radionuclide:  Technetium 20m Sestamibi  Stress Radionuclide Dose: 33.0 mCi on 12/09/12           Stress Protocol Rest HR: 97 Stress HR: 122    Rest BP: 112/69 Stress BP: 111/60  Exercise Time (min): n/a METS: n/a   Predicted Max HR: 175 bpm % Max HR: 69.71 bpm Rate Pressure Product: 21308   Dose of Adenosine (mg):  n/a Dose of Lexiscan: 0.4 mg  Dose of Atropine (mg): n/a Dose of Dobutamine: n/a mcg/kg/min (at max HR)  Stress Test Technologist: Irean Hong, RN  Nuclear Technologist:  Domenic Polite, CNMT     Rest Procedure:  Myocardial perfusion imaging was performed at rest 45 minutes following the intravenous administration of Technetium 61m Sestamibi. Rest ECG: NSR - Normal EKG  Stress Procedure: Lexiscan cancelled yesterday due to hypotension. Patient held Bisoprolol and methadone last night and this am. The patient received IV Lexiscan 0.4 mg over 15-seconds.  Technetium 84m Sestamibi injected at 30-seconds.  Quantitative spect images were obtained after a 45 minute delay. Stress ECG: No significant change from baseline ECG  QPS Raw Data Images:  Images were motion corrected.  Soft tissue (diaphragm, bowel activity, breast) surround heart. Stress Images:  Small apical defect  Otherwise normal perfusion. Rest Images:  Comparison with the stress images reveals no significant change. Subtraction (SDS):  No evidence of ischemia. Transient Ischemic Dilatation (Normal <1.22):  0.92 Lung/Heart Ratio (Normal <0.45):  0.26  Quantitative Gated Spect Images QGS EDV:  97 ml QGS ESV:  26 ml  Impression Exercise Capacity:  Lexiscan with no  exercise. BP Response:  Normal blood pressure response. Clinical Symptoms:  No chest pain. ECG Impression:  No significant ST segment change suggestive of ischemia. Comparison with Prior Nuclear Study: No previous nuclear study performed  Overall Impression:  Probable normal perfusion and soft tissue attenuation (breast)    LV Ejection Fraction: 74%.  LV Wall Motion:  NL LV Function; NL Wall Motion  Dietrich Pates

## 2012-12-09 ENCOUNTER — Ambulatory Visit (HOSPITAL_COMMUNITY): Payer: Medicare Other | Attending: Internal Medicine

## 2012-12-09 DIAGNOSIS — R0989 Other specified symptoms and signs involving the circulatory and respiratory systems: Secondary | ICD-10-CM

## 2012-12-09 DIAGNOSIS — E282 Polycystic ovarian syndrome: Secondary | ICD-10-CM | POA: Insufficient documentation

## 2012-12-09 DIAGNOSIS — R079 Chest pain, unspecified: Secondary | ICD-10-CM | POA: Insufficient documentation

## 2012-12-09 DIAGNOSIS — I498 Other specified cardiac arrhythmias: Secondary | ICD-10-CM | POA: Insufficient documentation

## 2012-12-09 DIAGNOSIS — I10 Essential (primary) hypertension: Secondary | ICD-10-CM | POA: Insufficient documentation

## 2012-12-09 MED ORDER — TECHNETIUM TC 99M SESTAMIBI GENERIC - CARDIOLITE
33.0000 | Freq: Once | INTRAVENOUS | Status: AC | PRN
  Administered 2012-12-09: 33 via INTRAVENOUS

## 2012-12-09 MED ORDER — REGADENOSON 0.4 MG/5ML IV SOLN
0.4000 mg | Freq: Once | INTRAVENOUS | Status: AC
  Administered 2012-12-09: 0.4 mg via INTRAVENOUS

## 2013-05-04 ENCOUNTER — Encounter: Payer: Self-pay | Admitting: Cardiology

## 2013-05-18 ENCOUNTER — Other Ambulatory Visit: Payer: Self-pay | Admitting: *Deleted

## 2013-05-18 ENCOUNTER — Ambulatory Visit (INDEPENDENT_AMBULATORY_CARE_PROVIDER_SITE_OTHER): Payer: Medicare Other | Admitting: Cardiology

## 2013-05-18 ENCOUNTER — Encounter: Payer: Self-pay | Admitting: Cardiology

## 2013-05-18 ENCOUNTER — Encounter (INDEPENDENT_AMBULATORY_CARE_PROVIDER_SITE_OTHER): Payer: Self-pay

## 2013-05-18 VITALS — BP 120/84 | HR 104 | Ht 67.0 in | Wt 278.0 lb

## 2013-05-18 DIAGNOSIS — I498 Other specified cardiac arrhythmias: Secondary | ICD-10-CM

## 2013-05-18 DIAGNOSIS — R0789 Other chest pain: Secondary | ICD-10-CM

## 2013-05-18 DIAGNOSIS — R000 Tachycardia, unspecified: Secondary | ICD-10-CM

## 2013-05-18 DIAGNOSIS — E282 Polycystic ovarian syndrome: Secondary | ICD-10-CM

## 2013-05-18 NOTE — Patient Instructions (Signed)
Continue your current therapy  I will see you in one year   

## 2013-05-18 NOTE — Progress Notes (Signed)
Fanny Skates Date of Birth: 15-Aug-1965 Medical Record #[id number]  History of Present Illness: [name] is seen back today for a follow up visit.  She has multiple medical issues which include morbid obesity, bipolar disorder and persistent tachycardia. She also has PCOS and hypertension. Her tachycardia has been an issue to the point where it has limited her psychiatric medications.  She does not use stimulants.  Her tachycardia has been managed with beta blocker therapy and she is currently taking bisoprolol 5 mg daily. Earlier this year she complained of symptoms of chest pain. She had a Myoview study which was normal. Blood pressure has been well-controlled. She has had a change in her pain management and her methadone has been discontinued. She states that her current regimen is working very well for her.   Current Outpatient Prescriptions on File Prior to Visit  Medication Sig Dispense Refill  . albuterol (PROVENTIL HFA;VENTOLIN HFA) 108 (90 BASE) MCG/ACT inhaler Inhale 2 puffs into the lungs every 6 (six) hours as needed.      . ALPRAZolam (XANAX) 1 MG tablet Take 1 mg by mouth 3 (three) times daily.      Marland Kitchen BACLOFEN PO Take by mouth 3 (three) times daily.      . beclomethasone (QVAR) 80 MCG/ACT inhaler Inhale 3 puffs into the lungs 2 (two) times daily.       . bisoprolol (ZEBETA) 5 MG tablet Take 1 tablet (5 mg total) by mouth daily.  90 tablet  3  . buPROPion (WELLBUTRIN XL) 150 MG 24 hr tablet       . camphor-menthol (SARNA) lotion Apply topically as needed.      . fluticasone (FLONASE) 50 MCG/ACT nasal spray Place 1 spray into the nose daily.      Marland Kitchen lisinopril-hydrochlorothiazide (PRINZIDE,ZESTORETIC) 10-12.5 MG per tablet Take 1 tablet by mouth daily.      . metFORMIN (GLUCOPHAGE) 1000 MG tablet Take 1,000 mg by mouth 2 (two) times daily with a meal.      . nortriptyline (PAMELOR) 75 MG capsule Take 50 mg by mouth at bedtime.       Marland Kitchen omeprazole (PRILOSEC) 20 MG capsule Take 20 mg  by mouth daily.      . temazepam (RESTORIL) 15 MG capsule Take 30 mg by mouth at bedtime as needed.       . traZODone (DESYREL) 150 MG tablet Take 300 mg by mouth at bedtime.        No current facility-administered medications on file prior to visit.    Allergies  Allergen Reactions  . Shellfish Allergy Anaphylaxis  . Erythromycin Swelling  . Gabapentin Nausea And Vomiting  . Latex Rash  . Pregabalin Swelling  . Neuromuscular Blocking Agents     Past Medical History  Diagnosis Date  . H/O degenerative disc disease     L4-L5, L5-S1  . Hypertension   . Depression   . Reflux   . Postoperative anemia   . Hyperglycemia     Postoperative hyperglycemia  . Asthma     History of Asthma  . Polycystic disease, ovaries   . Bipolar disorder   . OSA (obstructive sleep apnea)     mild  . Sinus tachycardia 07/14/2012  . Obesity 07/14/2012  . Chest pain, atypical 11/30/2012    Past Surgical History  Procedure Laterality Date  . Spinal fusion    . Tonsillectomy  1996  . Ovarian cyst removal  1998    A cyst on the fallopian  tube removed  . Nasal sinus surgery  2004    History  Smoking status  . Never Smoker   Smokeless tobacco  . Not on file    History  Alcohol Use No    Family History  Problem Relation Age of Onset  . Cancer Father   . Cancer Maternal Grandmother   . Cancer Paternal Grandmother     Review of Systems: The review of systems is per the HPI.  All other systems were reviewed and are negative.  Physical Exam: BP 120/84  Pulse 104  Ht 5\' 7"  (1.702 m)  Wt 278 lb (126.1 kg)  BMI 43.53 kg/m2 Patient is very pleasant and in no acute distress. She is morbidly obese. Skin is warm and dry. Color is normal.  HEENT is unremarkable. Normocephalic/atraumatic. PERRL. Sclera are nonicteric. Neck is supple. No masses. No JVD. Lungs are clear. Cardiac exam shows a regular rate and rhythm. Abdomen is soft. Extremities are without edema. Gait and ROM are intact. No gross  neurologic deficits noted.  LABORATORY DATA:   Assessment / Plan: 1. HTN - BP is well controlled today.  2. Tachycardia - HR is well controlled on bisoprolol. I'll followup again in one year.  3. Morbid obesity   4. Chest pain with atypical features. Normal Myoview study.

## 2013-05-23 DIAGNOSIS — K529 Noninfective gastroenteritis and colitis, unspecified: Secondary | ICD-10-CM | POA: Insufficient documentation

## 2013-05-24 DIAGNOSIS — Z6833 Body mass index (BMI) 33.0-33.9, adult: Secondary | ICD-10-CM | POA: Insufficient documentation

## 2013-12-13 ENCOUNTER — Other Ambulatory Visit: Payer: Self-pay | Admitting: Cardiology

## 2014-03-28 ENCOUNTER — Other Ambulatory Visit: Payer: Self-pay | Admitting: Orthopaedic Surgery

## 2014-03-28 DIAGNOSIS — M542 Cervicalgia: Secondary | ICD-10-CM

## 2014-03-28 DIAGNOSIS — M545 Low back pain, unspecified: Secondary | ICD-10-CM

## 2014-04-11 ENCOUNTER — Ambulatory Visit
Admission: RE | Admit: 2014-04-11 | Discharge: 2014-04-11 | Disposition: A | Discharge status: Home / Self Care | Payer: Medicare Other | Source: Ambulatory Visit | Attending: Orthopaedic Surgery | Admitting: Orthopaedic Surgery

## 2014-04-11 DIAGNOSIS — M545 Low back pain, unspecified: Secondary | ICD-10-CM

## 2014-04-11 DIAGNOSIS — M542 Cervicalgia: Secondary | ICD-10-CM

## 2014-06-09 ENCOUNTER — Other Ambulatory Visit: Payer: Self-pay | Admitting: *Deleted

## 2014-06-09 MED ORDER — BISOPROLOL FUMARATE 5 MG PO TABS: ORAL_TABLET | ORAL | Status: DC

## 2014-07-05 DIAGNOSIS — F5102 Adjustment insomnia: Secondary | ICD-10-CM | POA: Insufficient documentation

## 2014-09-14 ENCOUNTER — Other Ambulatory Visit: Payer: Self-pay | Admitting: Podiatry

## 2014-09-14 DIAGNOSIS — M7672 Peroneal tendinitis, left leg: Secondary | ICD-10-CM

## 2014-09-24 ENCOUNTER — Ambulatory Visit
Admission: RE | Admit: 2014-09-24 | Discharge: 2014-09-24 | Disposition: A | Discharge status: Home / Self Care | Payer: Medicare Other | Source: Ambulatory Visit | Attending: Podiatry | Admitting: Podiatry

## 2014-09-24 DIAGNOSIS — M7672 Peroneal tendinitis, left leg: Secondary | ICD-10-CM

## 2014-12-07 ENCOUNTER — Other Ambulatory Visit: Payer: Self-pay | Admitting: Physical Medicine and Rehabilitation

## 2014-12-07 ENCOUNTER — Ambulatory Visit
Admission: RE | Admit: 2014-12-07 | Discharge: 2014-12-07 | Disposition: A | Discharge status: Home / Self Care | Payer: Medicare Other | Source: Ambulatory Visit | Attending: Physical Medicine and Rehabilitation | Admitting: Physical Medicine and Rehabilitation

## 2014-12-07 DIAGNOSIS — M25561 Pain in right knee: Secondary | ICD-10-CM

## 2015-05-15 ENCOUNTER — Telehealth: Payer: Self-pay | Admitting: Allergy

## 2015-05-15 ENCOUNTER — Other Ambulatory Visit: Payer: Self-pay | Admitting: Allergy

## 2015-05-15 MED ORDER — BUDESONIDE-FORMOTEROL FUMARATE 160-4.5 MCG/ACT IN AERO: 2.0000 | INHALATION_SPRAY | Freq: Two times a day (BID) | RESPIRATORY_TRACT | Status: DC

## 2015-05-15 NOTE — Telephone Encounter (Signed)
Faxed in rx for symbicort

## 2015-05-28 ENCOUNTER — Encounter: Payer: Self-pay | Admitting: Allergy and Immunology

## 2015-05-28 ENCOUNTER — Ambulatory Visit (INDEPENDENT_AMBULATORY_CARE_PROVIDER_SITE_OTHER): Payer: Medicare Other | Admitting: Allergy and Immunology

## 2015-05-28 VITALS — BP 116/80 | HR 100 | Temp 98.1°F | Resp 20 | Ht 66.54 in | Wt 250.4 lb

## 2015-05-28 DIAGNOSIS — J3089 Other allergic rhinitis: Secondary | ICD-10-CM | POA: Diagnosis not present

## 2015-05-28 DIAGNOSIS — J019 Acute sinusitis, unspecified: Secondary | ICD-10-CM | POA: Insufficient documentation

## 2015-05-28 DIAGNOSIS — J0100 Acute maxillary sinusitis, unspecified: Secondary | ICD-10-CM | POA: Diagnosis not present

## 2015-05-28 DIAGNOSIS — J45901 Unspecified asthma with (acute) exacerbation: Secondary | ICD-10-CM

## 2015-05-28 MED ORDER — METHYLPREDNISOLONE ACETATE 80 MG/ML IJ SUSP: 80.0000 mg | Freq: Once | INTRAMUSCULAR | Status: DC

## 2015-05-28 MED ORDER — ALBUTEROL SULFATE (2.5 MG/3ML) 0.083% IN NEBU: 2.5000 mg | INHALATION_SOLUTION | RESPIRATORY_TRACT | Status: DC | PRN

## 2015-05-28 MED ORDER — METHYLPREDNISOLONE ACETATE 80 MG/ML IJ SUSP
80.0000 mg | Freq: Once | INTRAMUSCULAR | Status: AC
  Administered 2015-05-28: 80 mg via INTRAMUSCULAR

## 2015-05-28 MED ORDER — PREDNISONE 1 MG PO TABS: 10.0000 mg | ORAL_TABLET | ORAL | Status: DC

## 2015-05-28 MED ORDER — AZELASTINE HCL 0.15 % NA SOLN: 2.0000 | Freq: Two times a day (BID) | NASAL | Status: DC

## 2015-05-28 MED ORDER — MOMETASONE FURO-FORMOTEROL FUM 200-5 MCG/ACT IN AERO: 2.0000 | INHALATION_SPRAY | Freq: Two times a day (BID) | RESPIRATORY_TRACT | Status: DC

## 2015-05-28 NOTE — Assessment & Plan Note (Signed)
   Depo-Medrol 80 mg was administered in the office.  Prednisone has been provided and is to be started tomorrow as follows: 20 mg daily x 4 days, 10 mg x1 day, then stop.  A prescription has been provided for [facility] (mometasone/formoterol) 200/5 g, 2 inhalations via spacer device twice a day.  Subjective and objective measures of pulmonary function will be followed and the treatment plan will be adjusted accordingly.  The patient has been asked to contact me if her symptoms persist or progress. Otherwise, she may return for follow up in 6 weeks.

## 2015-05-28 NOTE — Progress Notes (Signed)
History of present illness: HPI Comments: [name] is a 49 y.o. female with asthma and chronic rhinitis who presents today for a sick visit.  She was last seen in this office in June 2015.  She reports that she has been experiencing coughing, dyspnea, and wheezing since approximately mid-August despite compliance with Symbicort 160/4.5 g, 2 inhalations via spacer device twice a day.  She believes that these symptoms are related to ragweed exposure.  Over the past 2 weeks she has been experiencing nasal congestion, sinus pressure, thick postnasal drainage, and sore throat.  She denies fevers or chills.   Assessment and plan: Asthma with acute exacerbation  Depo-Medrol 80 mg was administered in the office.  Prednisone has been provided and is to be started tomorrow as follows: 20 mg daily x 4 days, 10 mg x1 day, then stop.  A prescription has been provided for [facility] (mometasone/formoterol) 200/5 g, 2 inhalations via spacer device twice a day.  Subjective and objective measures of pulmonary function will be followed and the treatment plan will be adjusted accordingly.  The patient has been asked to contact me if her symptoms persist or progress. Otherwise, she may return for follow up in 6 weeks.  Acute sinusitis  Depo medrol injection and prednisone taper (as above).  Nasal saline lavage as needed has been recommended along with instructions for proper administration.  A prescription has been provided for azelastine nasal spray, 1-2 sprays per nostril 2 times daily as needed. Proper nasal spray technique has been discussed and demonstrated.   May add fluticasone nasal spray as needed.  Allergic rhinitis  Aeroallergen avoidance measures have been discussed and provided in written form.  Nasal saline lavage, azelastine nasal spray, plus/minus fluticasone nasal spray as needed.  Medications ordered this encounter: Meds ordered this encounter  Medications  .  methylPREDNISolone acetate (DEPO-MEDROL) injection 80 mg    Sig:   . predniSONE (DELTASONE) tablet 10 mg    Sig:   . methylPREDNISolone acetate (DEPO-MEDROL) injection 80 mg    Sig:   . albuterol (PROVENTIL) (2.5 MG/3ML) 0.083% nebulizer solution    Sig: Take 3 mLs (2.5 mg total) by nebulization every 4 (four) hours as needed for wheezing or shortness of breath.    Dispense:  75 mL    Refill:  1  . Azelastine HCl 0.15 % SOLN    Sig: Place 2 sprays into the nose 2 (two) times daily. 1 to 2 sprays each nostril twice daily for runny nose    Dispense:  30 mL    Refill:  5  . mometasone-formoterol (DULERA) 200-5 MCG/ACT AERO    Sig: Inhale 2 puffs into the lungs 2 (two) times daily.    Dispense:  1 Inhaler    Refill:  5    Diagnositics: Spirometry: FVC was 3.79 L and FEV1 was 2.86 L with significant post bronchodilator improvement.     Physical examination: Blood pressure 116/80, pulse 100, temperature 98.1 F (36.7 C), temperature source Oral, resp. rate 20, height 5' 6.54" (1.69 m), weight 250 lb 7.1 oz (113.6 kg).  General: Alert, interactive, in no acute distress. HEENT: TMs pearly gray, turbinates moderately edematous without discharge, post-pharynx moderately erythematous. Neck: Supple without lymphadenopathy. Lungs: Clear to auscultation without wheezing, rhonchi or rales. CV: Normal S1, S2 without murmurs. Skin: Warm and dry, without lesions or rashes.  The following portions of the patient's history were reviewed and updated as appropriate: allergies, current medications, past family history, past medical history, past social  history, past surgical history and problem list.  Outpatient medications:   Medication List       This list is accurate as of: 05/28/15  7:13 PM.  Always use your most recent med list.               ALPRAZolam 1 MG tablet  Commonly known as:  XANAX  Take 1 mg by mouth.     Azelastine HCl 0.15 % Soln  Place 2 sprays into the nose 2 (two)  times daily. 1 to 2 sprays each nostril twice daily for runny nose     BACLOFEN PO  Take by mouth 3 (three) times daily.     beclomethasone 80 MCG/ACT inhaler  Commonly known as:  QVAR  Inhale 3 puffs into the lungs 2 (two) times daily.     bisoprolol 5 MG tablet  Commonly known as:  ZEBETA  TAKE 1 TABLET (5 MG TOTAL) BY MOUTH DAILY.     budesonide-formoterol 160-4.5 MCG/ACT inhaler  Commonly known as:  SYMBICORT  Inhale 2 puffs into the lungs 2 (two) times daily.     buPROPion 150 MG 24 hr tablet  Commonly known as:  WELLBUTRIN XL     camphor-menthol lotion  Commonly known as:  SARNA  Apply topically as needed.     clonazePAM 1 MG tablet  Commonly known as:  KLONOPIN  Take 2 mg by mouth at bedtime.     fentaNYL 50 MCG/HR  Commonly known as:  DURAGESIC - dosed mcg/hr     fluticasone 50 MCG/ACT nasal spray  Commonly known as:  FLONASE  Place 1 spray into the nose daily.     hydrochlorothiazide 12.5 MG capsule  Commonly known as:  MICROZIDE  TAKE 1 TABLET (12.5 MG TOTAL) BY MOUTH DAILY.     levonorgestrel-ethinyl estradiol 0.15-0.03 MG tablet  Commonly known as:  SEASONALE,INTROVALE,JOLESSA  TAKE 1 TABLET BY MOUTH DAILY.     metFORMIN 1000 MG tablet  Commonly known as:  GLUCOPHAGE  Take 1,000 mg by mouth 2 (two) times daily with a meal.     mometasone-formoterol 200-5 MCG/ACT Aero  Commonly known as:  DULERA  Inhale 2 puffs into the lungs 2 (two) times daily.     nortriptyline 75 MG capsule  Commonly known as:  PAMELOR  Take 75 mg by mouth.     omeprazole 20 MG capsule  Commonly known as:  PRILOSEC  Take 20 mg by mouth.     oxymorphone 5 MG tablet  Commonly known as:  OPANA  Take 5 mg by mouth.     perphenazine 4 MG tablet  Commonly known as:  TRILAFON     PROAIR HFA 108 (90 BASE) MCG/ACT inhaler  Generic drug:  albuterol  Inhale 2 puffs into the lungs every 4 (four) hours as needed for wheezing or shortness of breath.     albuterol (2.5 MG/3ML)  0.083% nebulizer solution  Commonly known as:  PROVENTIL  Take 3 mLs (2.5 mg total) by nebulization every 4 (four) hours as needed for wheezing or shortness of breath.     traZODone 100 MG tablet  Commonly known as:  DESYREL  Take 300 mg by mouth.        Known medication allergies: Allergies  Allergen Reactions  . Shellfish Allergy Anaphylaxis  . Erythromycin Swelling  . Gabapentin Nausea And Vomiting  . Latex Rash  . Pregabalin Swelling  . Duloxetine Itching    Unknown reaction  . Neuromuscular Blocking Agents  Review of systems: Constitutional: Negative for fever, chills and weight loss.  HENT: Negative for nosebleeds.  Positive for sinus pain and sore throat. Eyes: Negative for blurred vision.  Respiratory: Negative for hemoptysis.   Positive for wheezing and dyspnea. Cardiovascular: Negative for chest pain.  Gastrointestinal: Negative for diarrhea and constipation.  Genitourinary: Negative for dysuria.  Musculoskeletal: Negative for myalgias and joint pain.  Neurological: Negative for dizziness.  Endo/Heme/Allergies: Does not bruise/bleed easily.   Past Medical History  Diagnosis Date  . H/O degenerative disc disease     L4-L5, L5-S1  . Hypertension   . Depression   . Reflux   . Postoperative anemia   . Hyperglycemia     Postoperative hyperglycemia  . Asthma     History of Asthma  . Polycystic disease, ovaries   . Bipolar disorder ([place])   . OSA (obstructive sleep apnea)     mild  . Sinus tachycardia ([place]) 07/14/2012  . Obesity 07/14/2012  . Chest pain, atypical 11/30/2012    Family History  Problem Relation Age of Onset  . Cancer Father   . Cancer Maternal Grandmother   . Cancer Paternal Grandmother     Social History   Social History  . Marital Status: Divorced    Spouse Name: N/A  . Number of Children: 0  . Years of Education: N/A   Occupational History  . disabled    Social History Main Topics  . Smoking status: Never Smoker   .  Smokeless tobacco: Not on file  . Alcohol Use: No  . Drug Use: No  . Sexual Activity: No   Other Topics Concern  . Not on file   Social History Narrative    I appreciate the opportunity to take part in this [facility] care. Please do not hesitate to contact me with questions.  Sincerely,   R. Edgar Frisk, MD

## 2015-05-28 NOTE — Assessment & Plan Note (Signed)
   Aeroallergen avoidance measures have been discussed and provided in written form.  Nasal saline lavage, azelastine nasal spray, plus/minus fluticasone nasal spray as needed.

## 2015-05-28 NOTE — Patient Instructions (Signed)
Asthma with acute exacerbation  Depo-Medrol 80 mg was administered in the office.  Prednisone has been provided and is to be started tomorrow as follows: 20 mg daily x 4 days, 10 mg x1 day, then stop.  A prescription has been provided for [facility] (mometasone/formoterol) 200/5 g, 2 inhalations via spacer device twice a day.  Subjective and objective measures of pulmonary function will be followed and the treatment plan will be adjusted accordingly.  The patient has been asked to contact me if her symptoms persist or progress. Otherwise, she may return for follow up in 6 weeks.  Acute sinusitis  Depo medrol injection and prednisone taper (as above).  Nasal saline lavage as needed has been recommended along with instructions for proper administration.  A prescription has been provided for azelastine nasal spray, 1-2 sprays per nostril 2 times daily as needed. Proper nasal spray technique has been discussed and demonstrated.   May add fluticasone nasal spray as needed.  Allergic rhinitis  Aeroallergen avoidance measures have been discussed and provided in written form.  Nasal saline lavage, azelastine nasal spray, plus/minus fluticasone nasal spray as needed.  Reducing Pollen Exposure  The American Academy of Allergy, Asthma and Immunology suggests the following steps to reduce your exposure to pollen during allergy seasons.    1. Do not hang sheets or clothing out to dry; pollen may collect on these items. 2. Do not mow lawns or spend time around freshly cut grass; mowing stirs up pollen. 3. Keep windows closed at night.  Keep car windows closed while driving. 4. Minimize morning activities outdoors, a time when pollen counts are usually at their highest. 5. Stay indoors as much as possible when pollen counts or humidity is high and on windy days when pollen tends to remain in the air longer. 6. Use air conditioning when possible.  Many air conditioners have filters that trap the  pollen spores. 7. Use a HEPA room air filter to remove pollen form the indoor air you breathe.

## 2015-05-28 NOTE — Assessment & Plan Note (Signed)
   Depo medrol injection and prednisone taper (as above).  Nasal saline lavage as needed has been recommended along with instructions for proper administration.  A prescription has been provided for azelastine nasal spray, 1-2 sprays per nostril 2 times daily as needed. Proper nasal spray technique has been discussed and demonstrated.   May add fluticasone nasal spray as needed.

## 2015-05-29 ENCOUNTER — Encounter: Payer: Self-pay | Admitting: *Deleted

## 2015-08-21 ENCOUNTER — Ambulatory Visit: Payer: Medicare Other | Admitting: Cardiology

## 2015-09-13 ENCOUNTER — Ambulatory Visit: Payer: Medicare Other | Admitting: Cardiology

## 2015-11-01 ENCOUNTER — Encounter: Payer: Self-pay | Admitting: Cardiology

## 2015-11-01 ENCOUNTER — Ambulatory Visit (INDEPENDENT_AMBULATORY_CARE_PROVIDER_SITE_OTHER): Payer: Medicare Other | Admitting: Cardiology

## 2015-11-01 VITALS — BP 163/90 | HR 123 | Ht 66.0 in | Wt 262.6 lb

## 2015-11-01 DIAGNOSIS — I10 Essential (primary) hypertension: Secondary | ICD-10-CM | POA: Diagnosis not present

## 2015-11-01 DIAGNOSIS — R000 Tachycardia, unspecified: Secondary | ICD-10-CM | POA: Diagnosis not present

## 2015-11-01 DIAGNOSIS — R7303 Prediabetes: Secondary | ICD-10-CM

## 2015-11-01 DIAGNOSIS — R0789 Other chest pain: Secondary | ICD-10-CM

## 2015-11-01 NOTE — Patient Instructions (Signed)
Schedule Lexiscan Myoview. 

## 2015-11-02 ENCOUNTER — Telehealth: Payer: Self-pay | Admitting: Cardiology

## 2015-11-02 ENCOUNTER — Telehealth (HOSPITAL_COMMUNITY): Payer: Self-pay | Admitting: *Deleted

## 2015-11-02 NOTE — Telephone Encounter (Signed)
Called the patient and left a voicemail with my name and phone number to call me back and schedule the stress test.

## 2015-11-02 NOTE — Telephone Encounter (Signed)
Left message for patient to call and schedule myoview ordered by Dr. Martin[place]

## 2015-11-02 NOTE — Progress Notes (Signed)
[name] Date of Birth: 09-08-1965 Medical Record [id number]  History of Present Illness: [name] is seen back today for a evaluation of chest pain. She was last seen 3 years ago.  She has multiple medical issues which include morbid obesity, bipolar disorder and persistent tachycardia. She also has PCOS and hypertension. Her tachycardia has been an issue to the point where it has limited her psychiatric medications.  She does not use stimulants.  Her tachycardia was managed with beta blocker therapy but she ran out of it and it was not resumed.  Recently she has complained of hard chest pain associated with SOB and tingling in her arms. These episodes occur at rest while watching TV. She rates it at 7/10. It lasts up to 5 minutes.  She is fairly sedentary. BP has been high recently. She notes occasional sensation of fluttering in chest when she is lying down. She also notes hot flashes and night sweats.    Current Outpatient Prescriptions on File Prior to Visit  Medication Sig Dispense Refill  . albuterol (PROAIR HFA) 108 (90 BASE) MCG/ACT inhaler Inhale 2 puffs into the lungs every 4 (four) hours as needed for wheezing or shortness of breath.    Marland Kitchen albuterol (PROVENTIL) (2.5 MG/3ML) 0.083% nebulizer solution Take 3 mLs (2.5 mg total) by nebulization every 4 (four) hours as needed for wheezing or shortness of breath. 75 mL 1  . ALPRAZolam (XANAX) 1 MG tablet Take 1 mg by mouth.    . Azelastine HCl 0.15 % SOLN Place 2 sprays into the nose 2 (two) times daily. 1 to 2 sprays each nostril twice daily for runny nose 30 mL 5  . bisoprolol (ZEBETA) 5 MG tablet TAKE 1 TABLET (5 MG TOTAL) BY MOUTH DAILY. 90 tablet 0  . buPROPion (WELLBUTRIN XL) 150 MG 24 hr tablet     . clonazePAM (KLONOPIN) 1 MG tablet Take 2 mg by mouth at bedtime.  5  . fentaNYL (DURAGESIC - DOSED MCG/HR) 50 MCG/HR     . fluticasone (FLONASE) 50 MCG/ACT nasal spray Place 1 spray into the nose daily.    . hydrochlorothiazide  (MICROZIDE) 12.5 MG capsule TAKE 1 TABLET (12.5 MG TOTAL) BY MOUTH DAILY.  3  . levonorgestrel-ethinyl estradiol (SEASONALE,INTROVALE,JOLESSA) 0.15-0.03 MG tablet TAKE 1 TABLET BY MOUTH DAILY.    . metFORMIN (GLUCOPHAGE) 1000 MG tablet Take 1,000 mg by mouth 2 (two) times daily with a meal.    . mometasone-formoterol (DULERA) 200-5 MCG/ACT AERO Inhale 2 puffs into the lungs 2 (two) times daily. 1 Inhaler 5  . nortriptyline (PAMELOR) 75 MG capsule Take 75 mg by mouth.    Marland Kitchen omeprazole (PRILOSEC) 20 MG capsule Take 20 mg by mouth.    Marland Kitchen oxymorphone (OPANA) 5 MG tablet Take 5 mg by mouth.    . perphenazine (TRILAFON) 4 MG tablet     . traZODone (DESYREL) 100 MG tablet Take 300 mg by mouth.     Current Facility-Administered Medications on File Prior to Visit  Medication Dose Route Frequency Provider Last Rate Last Dose  . methylPREDNISolone acetate (DEPO-MEDROL) injection 80 mg  80 mg Intramuscular Once Adelina Mings, MD      . predniSONE (DELTASONE) tablet 10 mg  10 mg Oral UD Adelina Mings, MD        Allergies  Allergen Reactions  . Shellfish Allergy Anaphylaxis  . Erythromycin Swelling  . Gabapentin Nausea And Vomiting  . Latex Rash  . Pregabalin Swelling  . Duloxetine  Itching    Unknown reaction  . Neuromuscular Blocking Agents     Past Medical History  Diagnosis Date  . H/O degenerative disc disease     L4-L5, L5-S1  . Hypertension   . Depression   . Reflux   . Postoperative anemia   . Hyperglycemia     Postoperative hyperglycemia  . Asthma     History of Asthma  . Polycystic disease, ovaries   . Bipolar disorder ([place])   . OSA (obstructive sleep apnea)     mild  . Sinus tachycardia ([place]) 07/14/2012  . Obesity 07/14/2012  . Chest pain, atypical 11/30/2012    Past Surgical History  Procedure Laterality Date  . Spinal fusion    . Tonsillectomy  1996  . Ovarian cyst removal  1998    A cyst on the fallopian tube removed  . Nasal sinus surgery  2004     History  Smoking status  . Never Smoker   Smokeless tobacco  . Not on file    History  Alcohol Use No    Family History  Problem Relation Age of Onset  . Cancer Father   . Cancer Maternal Grandmother   . Cancer Paternal Grandmother     Review of Systems: The review of systems is per the HPI.  All other systems were reviewed and are negative.  Physical Exam: BP 163/90 mmHg  Pulse 123  Ht 5\' 6"  (1.676 m)  Wt 119.115 kg (262 lb 9.6 oz)  BMI 42.41 kg/m2 Patient is very pleasant and in no acute distress. She is morbidly obese. Skin is warm and dry. Color is normal.  HEENT is unremarkable. Normocephalic/atraumatic. PERRL. Sclera are nonicteric. Neck is supple. No masses. No JVD. Lungs are clear. Cardiac exam shows a regular rate and rhythm- tachycardic. No gallop, murmur or click. Abdomen is soft. Extremities are without edema. Gait and ROM are intact. No gross neurologic deficits noted.  LABORATORY DATA: ECG today demonstrates sinus tachycardia with a rate 123. RAE, LAD. Poor R wave progression. I have personally reviewed and interpreted this study.   Assessment / Plan: 1. HTN - BP is elevated today. Recommend monitoring BP closely. If it remains elevated would resume beta blocker given her resting tachycardia. She was previously on Zbeta 5 mg daily.  2. Tachycardia - HR is elevated. Extensive work up in the past was negative. If symptomatic would resume beta blocker.  3. Morbid obesity   4. Chest pain with atypical features. She does have cardiac risk factors of hypertension, obesity, polycystic ovarian disease, and family history of coronary disease. I recommended a Lexiscan myoview to assess cardiac risk.

## 2015-11-15 ENCOUNTER — Telehealth (HOSPITAL_COMMUNITY): Payer: Self-pay | Admitting: *Deleted

## 2015-11-15 NOTE — Telephone Encounter (Signed)
Left message on voicemail per DPR in reference to upcoming appointment scheduled on 11/21/15 at 1230 with detailed instructions given per Myocardial Perfusion Study Information Sheet for the test. LM to arrive 15 minutes early, and that it is imperative to arrive on time for appointment to keep from having the test rescheduled. If you need to cancel or reschedule your appointment, please call the office within 24 hours of your appointment. Failure to do so may result in a cancellation of your appointment, and a $50 no show fee. Phone number given for call back for any questions. Theta Leaf, Ranae Palms

## 2015-11-21 ENCOUNTER — Ambulatory Visit (HOSPITAL_COMMUNITY): Payer: Medicare Other | Attending: Cardiology

## 2015-11-21 DIAGNOSIS — R7303 Prediabetes: Secondary | ICD-10-CM

## 2015-11-21 DIAGNOSIS — E119 Type 2 diabetes mellitus without complications: Secondary | ICD-10-CM | POA: Diagnosis not present

## 2015-11-21 DIAGNOSIS — R000 Tachycardia, unspecified: Secondary | ICD-10-CM | POA: Diagnosis not present

## 2015-11-21 DIAGNOSIS — R0789 Other chest pain: Secondary | ICD-10-CM

## 2015-11-21 DIAGNOSIS — R0609 Other forms of dyspnea: Secondary | ICD-10-CM | POA: Diagnosis not present

## 2015-11-21 DIAGNOSIS — I10 Essential (primary) hypertension: Secondary | ICD-10-CM | POA: Diagnosis not present

## 2015-11-21 DIAGNOSIS — J45909 Unspecified asthma, uncomplicated: Secondary | ICD-10-CM | POA: Diagnosis not present

## 2015-11-21 MED ORDER — REGADENOSON 0.4 MG/5ML IV SOLN
0.4000 mg | Freq: Once | INTRAVENOUS | Status: AC
  Administered 2015-11-21: 0.4 mg via INTRAVENOUS

## 2015-11-21 MED ORDER — TECHNETIUM TC 99M TETROFOSMIN IV KIT
32.4000 | PACK | Freq: Once | INTRAVENOUS | Status: AC | PRN
  Administered 2015-11-21: 32 via INTRAVENOUS
  Filled 2015-11-21: qty 32

## 2015-11-22 ENCOUNTER — Ambulatory Visit (HOSPITAL_COMMUNITY): Payer: Medicare Other | Attending: Cardiology

## 2015-11-22 LAB — MYOCARDIAL PERFUSION IMAGING
CHL CUP NUCLEAR SRS: 9
LV sys vol: 35 mL
LVDIAVOL: 104 mL (ref 46–106)
NUC STRESS TID: 0.81
Peak HR: 114 {beats}/min
RATE: 0.28
Rest HR: 101 {beats}/min
SDS: 5
SSS: 14

## 2015-11-22 MED ORDER — TECHNETIUM TC 99M TETROFOSMIN IV KIT
32.5000 | PACK | Freq: Once | INTRAVENOUS | Status: AC | PRN
  Administered 2015-11-22: 33 via INTRAVENOUS
  Filled 2015-11-22: qty 33

## 2016-02-19 ENCOUNTER — Institutional Professional Consult (permissible substitution): Payer: Medicare Other | Admitting: Pulmonary Disease

## 2016-02-26 LAB — TSH: TSH: 2.88 u[IU]/mL (ref 0.41–5.90)

## 2016-02-26 LAB — HEMOGLOBIN A1C: Hemoglobin A1C: 5.8

## 2016-02-26 LAB — BASIC METABOLIC PANEL
CREATININE: 0.9 mg/dL (ref 0.5–1.1)
GLUCOSE: 91 mg/dL
POTASSIUM: 4.2 mmol/L (ref 3.4–5.3)
Sodium: 138 mmol/L (ref 137–147)

## 2016-02-26 LAB — T3: T3 TOTAL: 208

## 2016-03-03 ENCOUNTER — Ambulatory Visit (INDEPENDENT_AMBULATORY_CARE_PROVIDER_SITE_OTHER): Payer: Medicare Other

## 2016-03-03 ENCOUNTER — Ambulatory Visit (INDEPENDENT_AMBULATORY_CARE_PROVIDER_SITE_OTHER): Payer: Medicare Other | Admitting: Family Medicine

## 2016-03-03 ENCOUNTER — Encounter: Payer: Self-pay | Admitting: Family Medicine

## 2016-03-03 VITALS — BP 127/83 | HR 108 | Temp 98.1°F | Resp 18 | Wt 259.0 lb

## 2016-03-03 DIAGNOSIS — M25562 Pain in left knee: Secondary | ICD-10-CM

## 2016-03-03 DIAGNOSIS — M7502 Adhesive capsulitis of left shoulder: Secondary | ICD-10-CM

## 2016-03-03 DIAGNOSIS — E282 Polycystic ovarian syndrome: Secondary | ICD-10-CM

## 2016-03-03 DIAGNOSIS — M25561 Pain in right knee: Secondary | ICD-10-CM

## 2016-03-03 DIAGNOSIS — M25512 Pain in left shoulder: Secondary | ICD-10-CM

## 2016-03-03 DIAGNOSIS — Z6841 Body Mass Index (BMI) 40.0 and over, adult: Secondary | ICD-10-CM

## 2016-03-03 DIAGNOSIS — R7303 Prediabetes: Secondary | ICD-10-CM

## 2016-03-03 DIAGNOSIS — R718 Other abnormality of red blood cells: Secondary | ICD-10-CM

## 2016-03-03 DIAGNOSIS — M7500 Adhesive capsulitis of unspecified shoulder: Secondary | ICD-10-CM | POA: Insufficient documentation

## 2016-03-03 NOTE — Progress Notes (Signed)
Pt here to establish care.

## 2016-03-03 NOTE — Progress Notes (Signed)
[name] is a 50 y.o. female who presents to [facility] today for establish care and discuss right knee pain, and left shoulder pain. Patient has multiple medical problems including bipolar disorder obesity prediabetes as well as several chronic plan complaints. She has both a psychiatrist and a pain management doctor for prescribing and managing chronic pain as well as bipolar and anxiety.  She would like to focus today on her right knee and left shoulder.  Right knee pain: Ongoing for several years. Patient suffered a tibial plateau fracture about 7 years ago that was managed conservatively. Since then she's had continued knee pain. This is been managed with steroids and Hyaluronic acid injections as well as chronic opiates. The pain is worse with prolonged sitting in a flexed knee position as well as walking and with stairs. Symptoms are moderate and do interfere with her ability to exercise.  Left shoulder pain: Patient notes a one-month history of pain in the left lateral upper arm and shoulder. She notes the pain is worsened recently without injury. She describes difficulty abducting and externally rotating her arm. She denies any radiating pain weakness or numbness. Symptoms again are moderate and do interfere with activity.   Additionally patient notes vaginal bleeding. Her previous primary care doctor was treating this with birth control pills. The birth control pills recently stopped to see if patient was in menopause or perimenopausal. She currently is menstruating.  Past Medical History:  Diagnosis Date  . Allergy   . Asthma    History of Asthma  . Bipolar disorder ([place])   . Chest pain, atypical 11/30/2012  . Depression   . H/O degenerative disc disease    L4-L5, L5-S1  . Hyperglycemia    Postoperative hyperglycemia  . Hypertension   . Obesity  07/14/2012  . OSA (obstructive sleep apnea)    mild  . Polycystic disease, ovaries   . Postoperative anemia   . Reflux   . Sinus tachycardia ([place]) 07/14/2012   Past Surgical History:  Procedure Laterality Date  . NASAL SINUS SURGERY  2004  . OVARIAN CYST REMOVAL  1998   A cyst on the fallopian tube removed  . SPINAL FUSION    . TONSILLECTOMY  1996   Social History  Substance Use Topics  . Smoking status: Never Smoker  . Smokeless tobacco: Not on file  . Alcohol use No   family history includes Cancer in her father, maternal grandmother, and paternal grandmother.  ROS as above: No new headache, visual changes, nausea, vomiting, diarrhea, constipation, dizziness, abdominal pain, skin rash, fevers, chills, night sweats, weight loss, swollen lymph nodes, body aches, joint swelling, muscle aches, chest pain, shortness of breath, mood changes, visual or auditory hallucinations.    Medications: Current Outpatient Prescriptions  Medication Sig Dispense Refill  . HYDROmorphone (DILAUDID) 2 MG tablet Take 2 mg by mouth every 6 (six) hours as needed for severe pain.    Marland Kitchen temazepam (RESTORIL) 30 MG capsule Take 30 mg by mouth at bedtime as needed for sleep.    Marland Kitchen albuterol (PROAIR HFA) 108 (90 BASE) MCG/ACT inhaler Inhale 2 puffs into the lungs every 4 (four) hours as needed for wheezing or shortness of breath.    Marland Kitchen albuterol (PROVENTIL) (2.5 MG/3ML) 0.083% nebulizer solution Take 3 mLs (2.5 mg total) by nebulization every 4 (four) hours as needed for wheezing or shortness of breath. 75 mL 1  . ALPRAZolam Duanne Moron) 1  MG tablet Take 1 mg by mouth.    . Azelastine HCl 0.15 % SOLN Place 2 sprays into the nose 2 (two) times daily. 1 to 2 sprays each nostril twice daily for runny nose (Patient not taking: Reported on 03/03/2016) 30 mL 5  . bisoprolol (ZEBETA) 5 MG tablet TAKE 1 TABLET (5 MG TOTAL) BY MOUTH DAILY. (Patient not taking: Reported on 03/03/2016) 90 tablet 0  . buPROPion (WELLBUTRIN XL) 150 MG  24 hr tablet     . clonazePAM (KLONOPIN) 1 MG tablet Take 2 mg by mouth at bedtime.  5  . fentaNYL (DURAGESIC - DOSED MCG/HR) 50 MCG/HR     . fluticasone (FLONASE) 50 MCG/ACT nasal spray Place 1 spray into the nose daily.    . hydrochlorothiazide (MICROZIDE) 12.5 MG capsule TAKE 1 TABLET (12.5 MG TOTAL) BY MOUTH DAILY.  3  . levonorgestrel-ethinyl estradiol (SEASONALE,INTROVALE,JOLESSA) 0.15-0.03 MG tablet TAKE 1 TABLET BY MOUTH DAILY.    . metFORMIN (GLUCOPHAGE) 1000 MG tablet Take 1,000 mg by mouth 2 (two) times daily with a meal.    . mometasone-formoterol (DULERA) 200-5 MCG/ACT AERO Inhale 2 puffs into the lungs 2 (two) times daily. 1 Inhaler 5  . nortriptyline (PAMELOR) 75 MG capsule Take 75 mg by mouth.    Marland Kitchen omeprazole (PRILOSEC) 20 MG capsule Take 20 mg by mouth.    Marland Kitchen oxymorphone (OPANA) 5 MG tablet Take 5 mg by mouth.    . perphenazine (TRILAFON) 4 MG tablet     . traZODone (DESYREL) 100 MG tablet Take 300 mg by mouth.     Current Facility-Administered Medications  Medication Dose Route Frequency Provider Last Rate Last Dose  . methylPREDNISolone acetate (DEPO-MEDROL) injection 80 mg  80 mg Intramuscular Once Adelina Mings, MD      . predniSONE (DELTASONE) tablet 10 mg  10 mg Oral UD Adelina Mings, MD       Allergies  Allergen Reactions  . Shellfish Allergy Anaphylaxis  . Erythromycin Swelling  . Gabapentin Nausea And Vomiting  . Latex Rash  . Pregabalin Swelling  . Duloxetine Itching    Unknown reaction  . Neuromuscular Blocking Agents      Exam:  BP 127/83 (BP Location: Right Arm, Patient Position: Sitting, Cuff Size: Large)   Pulse (!) 108   Temp 98.1 F (36.7 C) (Oral)   Resp 18   Wt 259 lb (117.5 kg)   SpO2 95%   BMI 41.80 kg/m  Gen: Well NAD HEENT: EOMI,  MMM Lungs: Normal work of breathing. CTABL Heart: RRR no MRG Abd: NABS, Soft. Nondistended, Nontender Exts: Brisk capillary refill, warm and well perfused.  Left shoulder: Normal-appearing  nontender. Range of motion: Internal rotation to the lumbar spine External rotation 30 beyond neutral position but active and passive. Abduction limited to 90 degrees active and passive with pain. Hawkins and Neer's test difficult to perform due to guarding and pain and difficulty with shoulder motion  Right knee: No significant effusion Diffusely tender. Normal motion 1+ patellar crepitations on extension Stable ligamentous exam.   Procedure: Real-time Ultrasound Guided Injection of left GH joint  Device: GE Logiq E  Images permanently stored and available for review in the ultrasound unit. Verbal informed consent obtained. Discussed risks and benefits of procedure. Warned about infection bleeding damage to structures skin hypopigmentation and fat atrophy among others. Patient expresses understanding and agreement Time-out conducted.  Noted no overlying erythema, induration, or other signs of local infection.  Skin prepped in a sterile  fashion.  Local anesthesia: Topical Ethyl chloride.  With sterile technique and under real time ultrasound guidance: 80mg  Kenalog and 8 mL of Marcaine injected easily.  Completed without difficulty  Pain immediately resolved suggesting accurate placement of the medication.  Advised to call if fevers/chills, erythema, induration, drainage, or persistent bleeding.  Images permanently stored and available for review in the ultrasound unit.  Impression: Technically successful ultrasound guided injection. Ultrasound guidance needed due to body habitus  Lot number Kenalog  [id number] Lot number Marcaine [id number]   Following injection patient had resolution of pain but continued to have fixed lack of motion both active and passive in abduction and external rotation planes   X-ray left shoulder is unremarkable. X-ray right knee shows DJD and evidence of old tibial plateau fracture with no acute changes. Awaiting formal radiology review   No  results found for this or any previous visit (from the past 24 hour(s)). No results found.    Assessment and Plan: 50 y.o. female with   1) left shoulder pain: Concerning for adhesive capsulitis. Injection today. Plan for physical therapy. Recheck in a month.  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 I think at this point interventions are probably exhausted. Trial of physical therapy. Continue pain management.  3) vaginal bleeding: Check FSH LH estrogen progesterone as well as testosterone levels to evaluate for PCOS or vaginal bleeding issues or perimenopausal status. Consider restarting birth control pills  4) patient has microcytosis noted on labs from previous primary care provider. Will check iron stores.  Orders Placed This Encounter  Procedures  . DG Knee 1-2 Views Left    Standing Status:   Future    Number of Occurrences:   1    Standing Expiration Date:   05/04/2017    Order Specific Question:   Reason for Exam (SYMPTOM  OR DIAGNOSIS REQUIRED)    Answer:   For use with right knee x-ray, bilateral AP and Rosenberg standing.    Order Specific Question:   Is the patient pregnant?    Answer:   No    Order Specific Question:   Preferred imaging location?    Answer:   [facility]  . DG Knee Complete 4 Views Right    Please include patellar sunrise, lateral, and weightbearing bilateral AP and bilateral rosenberg views    Standing Status:   Future    Number of Occurrences:   1    Standing Expiration Date:   05/03/2017    Order Specific Question:   Reason for exam:    Answer:   Please include patellar sunrise, lateral, and weightbearing bilateral AP and bilateral rosenberg views    Comments:   Please include patellar sunrise, lateral, and weightbearing bilateral AP and bilateral rosenberg views    Order Specific Question:   Preferred imaging location?    Answer:   [facility]  . DG Shoulder Left    Standing Status:   Future    Number of  Occurrences:   1    Standing Expiration Date:   05/03/2017    Order Specific Question:   Reason for Exam (SYMPTOM  OR DIAGNOSIS REQUIRED)    Answer:   eval shoulder pain    Order Specific Question:   Is patient pregnant?    Answer:   No    Order Specific Question:   Preferred imaging location?    Answer:   [facility]    Discussed warning signs or symptoms. Please see discharge instructions.  Patient expresses understanding.

## 2016-03-03 NOTE — Patient Instructions (Addendum)
Thank you for coming in today. Attend PT.  Return in 1 month for recheck.  We will get reccords.  Get labs soon.     Adhesive Capsulitis Adhesive capsulitis is inflammation of the tendons and ligaments that surround the shoulder joint (shoulder capsule). This condition causes the shoulder to become stiff and painful to move. Adhesive capsulitis is also called frozen shoulder. CAUSES This condition may be caused by:  An injury to the shoulder joint.  Straining the shoulder.  Not moving the shoulder for a period of time. This can happen if your arm was injured or in a sling.  Long-standing health problems, such as:  Diabetes.  Thyroid problems.  Heart disease.  Stroke.  Rheumatoid arthritis.  Lung disease. In some cases, the cause may not be known. RISK FACTORS This condition is more likely to develop in:  Women.  People who are older than 49 years of age. SYMPTOMS Symptoms of this condition include:  Pain in the shoulder when moving the arm. There may also be pain when parts of the shoulder are touched. The pain is worse at night or when at rest.  Soreness or aching in the shoulder.  Inability to move the shoulder normally.  Muscle spasms. DIAGNOSIS This condition is diagnosed with a physical exam and imaging tests, such as an X-ray or MRI. TREATMENT This condition may be treated with:  Treatment of the underlying cause or condition.  Physical therapy. This involves performing exercises to get the shoulder moving again.  Medicine. Medicine may be given to relieve pain, inflammation, or muscle spasms.  Steroid injections into the shoulder joint.  Shoulder manipulation. This is a procedure to move the shoulder into another position. It is done after you are given a medicine to make you fall asleep (general anesthetic). The joint may also be injected with salt water at high pressure to break down scarring.  Surgery. This may be done in severe cases when  other treatments have failed. Although most people recover completely from adhesive capsulitis, some may not regain the full movement of the shoulder. HOME CARE INSTRUCTIONS  Take over-the-counter and prescription medicines only as told by your health care provider.  If you are being treated with physical therapy, follow instructions from your physical therapist.  Avoid exercises that put a lot of demand on your shoulder, such as throwing. These exercises can make pain worse.  If directed, apply ice to the injured area:  Put ice in a plastic bag.  Place a towel between your skin and the bag.  Leave the ice on for 20 minutes, 2-3 times per day. SEEK MEDICAL CARE IF:  You develop new symptoms.  Your symptoms get worse.   This information is not intended to replace advice given to you by your health care provider. Make sure you discuss any questions you have with your health care provider.   Document Released: 04/20/2009 Document Revised: 03/14/2015 Document Reviewed: 10/16/2014 Elsevier Interactive Patient Education [employer].

## 2016-03-04 ENCOUNTER — Encounter: Payer: Self-pay | Admitting: Family Medicine

## 2016-03-04 DIAGNOSIS — D509 Iron deficiency anemia, unspecified: Secondary | ICD-10-CM | POA: Insufficient documentation

## 2016-03-04 DIAGNOSIS — E785 Hyperlipidemia, unspecified: Secondary | ICD-10-CM | POA: Insufficient documentation

## 2016-03-04 LAB — LIPID PANEL
CHOL/HDL RATIO: 4.2 ratio (ref <=5.0)
CHOLESTEROL: 208 mg/dL — AB (ref 125–200)
HDL: 50 mg/dL (ref >=46)
LDL Cholesterol: 137 mg/dL — ABNORMAL HIGH (ref <130)
Triglycerides: 103 mg/dL (ref <150)
VLDL: 21 mg/dL (ref <30)

## 2016-03-04 LAB — TESTOSTERONE: Testosterone: 33 ng/dL

## 2016-03-04 LAB — IRON AND TIBC
%SAT: 11 % (ref 11–50)
Iron: 54 ug/dL (ref 40–190)
TIBC: 470 ug/dL — ABNORMAL HIGH (ref 250–450)
UIBC: 416 ug/dL — ABNORMAL HIGH (ref 125–400)

## 2016-03-04 LAB — FOLLICLE STIMULATING HORMONE: FSH: 2.9 m[IU]/mL

## 2016-03-04 LAB — FERRITIN: FERRITIN: 25 ng/mL (ref 10–232)

## 2016-03-04 LAB — LUTEINIZING HORMONE: LH: <0.2 m[IU]/mL

## 2016-03-04 LAB — PROGESTERONE

## 2016-03-05 ENCOUNTER — Institutional Professional Consult (permissible substitution): Payer: Medicare Other | Admitting: Internal Medicine

## 2016-03-05 LAB — ESTROGENS, TOTAL: ESTROGEN: 148.1 pg/mL

## 2016-03-06 ENCOUNTER — Encounter: Payer: Self-pay | Admitting: Family Medicine

## 2016-03-06 ENCOUNTER — Ambulatory Visit: Payer: Medicare Other | Admitting: Rehabilitative and Restorative Service Providers"

## 2016-03-11 ENCOUNTER — Encounter: Payer: Self-pay | Admitting: Rehabilitative and Restorative Service Providers"

## 2016-03-11 ENCOUNTER — Ambulatory Visit (INDEPENDENT_AMBULATORY_CARE_PROVIDER_SITE_OTHER): Payer: Medicare Other | Admitting: Rehabilitative and Restorative Service Providers"

## 2016-03-11 DIAGNOSIS — R29898 Other symptoms and signs involving the musculoskeletal system: Secondary | ICD-10-CM

## 2016-03-11 DIAGNOSIS — M25561 Pain in right knee: Secondary | ICD-10-CM | POA: Diagnosis not present

## 2016-03-11 DIAGNOSIS — M25512 Pain in left shoulder: Secondary | ICD-10-CM

## 2016-03-11 NOTE — Patient Instructions (Addendum)
HIP: Hamstrings - Supine   Place strap around foot. Raise leg up, keeping knee straight.  Bend opposite knee to protect back if indicated. Hold 30 seconds. 3 reps per set, 2-3 sets per day     Outer Hip Stretch: Reclined IT Band Stretch (Strap)   Strap around one foot, pull leg across body until you feel a pull or stretch, with shoulders on mat. Hold for 30 seconds. Repeat 3 times each leg. 2-3 times/day.  Piriformis Stretch   Lying on back, pull right knee toward opposite shoulder. Hold 30 seconds. Repeat 3 times. Do 2-3 sessions per day.   Quads / HF, Prone   Lie face down. Grasp one ankle with same-side hand. Use towel if needed to reach. Gently pull foot toward buttock.  Hold 30 seconds. Repeat 3 times per session. Do 2-3 sessions per day.   Shoulder Blade Squeeze    Rotate shoulders back, then squeeze shoulder blades down and back Hold 10 sec Repeat _10___ times. Do __several__ sessions per day.   Axial Extension (Chin Tuck)    Pull chin in and lengthen back of neck. Hold __10__ seconds while counting out loud. Repeat __10__ times. Do _several ___ sessions per day.      ROM: Flexion (Alternate)    Slide right arm up wall, with palm out, by leaning toward wall. Hold __20-30__ seconds. Repeat _3___ times per set.  Do _2-3___ sessions per day.

## 2016-03-11 NOTE — Therapy (Signed)
[facility], [place], [address] Phone: [phone]   Fax:  [phone]  Physical Therapy Evaluation  Patient Details  Name: [name] MRN: [id number] Date of Birth: 1966-06-30 Referring Provider: Dr. [name]   Encounter Date: 03/11/2016      PT End of Session - 03/11/16 0951    Visit Number 1   Number of Visits 12   Date for PT Re-Evaluation 04/22/16   PT Start Time 1009   PT Stop Time 1105   PT Time Calculation (min) 56 min   Activity Tolerance Patient tolerated treatment well      Past Medical History:  Diagnosis Date  . Allergy   . Asthma    History of Asthma  . Bipolar disorder ([place])   . Chest pain, atypical 11/30/2012  . Depression   . H/O degenerative disc disease    L4-L5, L5-S1  . Hyperglycemia    Postoperative hyperglycemia  . Hypertension   . Obesity 07/14/2012  . OSA (obstructive sleep apnea)    mild  . Polycystic disease, ovaries   . Postoperative anemia   . Reflux   . Sinus tachycardia ([place]) 07/14/2012    Past Surgical History:  Procedure Laterality Date  . NASAL SINUS SURGERY  2004  . OVARIAN CYST REMOVAL  1998   A cyst on the fallopian tube removed  . SPINAL FUSION    . TONSILLECTOMY  1996    There were no vitals filed for this visit.       Subjective Assessment - 03/11/16 1009    Subjective [name] reports that she noticed she had difficulty lifting her Lt arm to rest arm on the window of her truck. Symptoms have been present for the past 3 months. She received a cortisone injection which has helped. Rt knee pain has been flared up from walking over the past 4-5 months. She has difficulty climbing stairs. She has a history of Rt knee pain for the past 10 years. She also has pain in the LB and is scheduled for SI injection 03/24/16    Pertinent History 2006 fell sustaining fx of Rt tibial plateu treated with immobilization and NWB for 8 weeks. She has had  multiple injections in the Rt knee and has had 2 symvisc injections; spinal fusion L4-S1 2005; bilat releases knees 2001; Rt knee I & D patella; 2 foot surgeries plantar fasciitis 2015 Rt x 2    How long can you sit comfortably? 30 min    How long can you stand comfortably? 15-20 min    How long can you walk comfortably? level surface 15 min uneven ground 5 min    Diagnostic tests xrays; MRI's   Patient Stated Goals get dome relief in the Lt shoulder pain and get it loosened up; strengthening muscles in the knee and get rid of some of the pain    Currently in Pain? Yes   Pain Score 2    Pain Location Shoulder   Pain Orientation Left   Pain Descriptors / Indicators Sharp;Aching   Pain Type Acute pain   Pain Onset More than a month ago   Pain Frequency Intermittent   Aggravating Factors  lifting arm; carrying; lying on Lt side   Pain Relieving Factors injection; ice; lying flat   Multiple Pain Sites Yes   Pain Score 5   Pain Location Knee   Pain Orientation Right   Pain Descriptors / Indicators Aching;Burning;Sharp   Pain Type  Chronic pain   Pain Radiating Towards up toward lateral hip/thigh    Pain Onset More than a month ago   Pain Frequency Intermittent   Aggravating Factors  sitting; standing; waling; bending; straightening; stairs   Pain Relieving Factors prop LE and use ice             [facility] PT Assessment - 03/11/16 0001      Assessment   Medical Diagnosis Lt shoulder pain; frozen shoulder; Rt knee pain    Referring Provider Dr. [name]    Onset Date/Surgical Date 12/06/15   Hand Dominance Right   Next MD Visit 03/31/16   Prior Therapy none for shoudler - has had PT for knee and back      Precautions   Precautions None     Balance Screen   Has the patient fallen in the past 6 months No   Has the patient had a decrease in activity level because of a fear of falling?  No   Is the patient reluctant to leave their home because of a fear of falling?  No     Home  Environment   Additional Comments single level mobil home 8 steps to enter home - rails both side      Prior Function   Level of Independence Independent   Vocation On disability  2005 - due to back problems    Leisure light household chores; cooking; walking ~5 min 2x/day      Observation/Other Assessments   Focus on Therapeutic Outcomes (FOTO)  68% limitaion      Sensation   Additional Comments WFL's per pt report Lt shd/Rt LE - does have nerve damage Lt foot from lumbar surgery 2005      Posture/Postural Control   Posture Comments Head forward; shoulders rounded and elevated; increaed thoracic kyphosis; scapulae abducted and rotated along the thoracic wall weight shifted to Lt in standing      AROM   Right Shoulder Extension 58 Degrees   Right Shoulder Flexion 148 Degrees   Right Shoulder ABduction 155 Degrees   Right Shoulder Internal Rotation 34 Degrees   Right Shoulder External Rotation 92 Degrees   Left Shoulder Extension 60 Degrees   Left Shoulder Flexion 142 Degrees   Left Shoulder ABduction 144 Degrees   Left Shoulder Internal Rotation 32 Degrees   Left Shoulder External Rotation 97 Degrees   Right/Left Hip --  limited end ranges bilat hips    Right Knee Extension -4   Right Knee Flexion 113   Left Knee Extension 3   Left Knee Flexion 117   Cervical Flexion 58   Cervical Extension 42   Cervical - Right Side Bend 38   Cervical - Left Side Bend 34  pain Lt cervical area   Cervical - Right Rotation 68   Cervical - Left Rotation 75  pain Lt cervical area      Strength   Right/Left Shoulder --  WFL's bilat UE's    Right/Left Hip --  Rt hip flex/abd 4+/5; Lt 5-/5 prone hip ext Rt 4/5; Lt 4+/5    Right/Left Knee --  5/5 bilat knees     Flexibility   Hamstrings tight Rt 55 deg; Lt 60 deg    Quadriceps Rt 90 deg; Lt 95 deg    ITB tight bilat      Palpation   Patella mobility hypomobil Rt > Lt Rt patella superior and lateral in orientation    Palpation  comment tender and  tight upper trap; pecs; leveator; deltoid Lt > Rt shoulder; lateral quads/lateral patella Rt > Lt                    [facility] Adult PT Treatment/Exercise - 03/11/16 0001      Knee/Hip Exercises: Stretches   Passive Hamstring Stretch 30 seconds;3 reps   Quad Stretch 3 reps;30 seconds   ITB Stretch 2 reps;30 seconds   Piriformis Stretch 2 reps;30 seconds     Shoulder Exercises: Standing   Flexion AAROM;Left;5 reps  doorway slide 20 sec hold    Other Standing Exercises chin tuck 10 sec x 10; scap squeeze with noodle 10 sec x 10     Cryotherapy   Number Minutes Cryotherapy 15 Minutes   Cryotherapy Location Shoulder;Knee  Lt shoulder; Rt knee    Type of Cryotherapy Ice pack     Electrical Stimulation   Electrical Stimulation Location Lt shoulder; Rt knee    Electrical Stimulation Action premod   Electrical Stimulation Parameters to tolerance    Electrical Stimulation Goals Pain;Tone     Manual Therapy   Kinesiotex --  taping for lateral patellar shift Rt                 PT Education - 03/11/16 1102    Education provided Yes   Education Details HEP   Person(s) Educated Patient   Methods Explanation;Demonstration;Tactile cues;Verbal cues;Handout   Comprehension Verbalized understanding;Returned demonstration;Verbal cues required;Tactile cues required             PT Long Term Goals - 03/11/16 0953      PT LONG TERM GOAL #1   Title Improve posture and alignment with patient to demonstrate more upright posture and alignment 04/22/16   Time 6   Period Weeks   Status New     PT LONG TERM GOAL #2   Title Increased AROM Lt shoudler to equal or greater than Rt 04/22/16   Time 6   Period Weeks   Status New     PT LONG TERM GOAL #3   Title Improve functional level Lt shoudler allowing patient to use Lt UE for ADL's without pain or limitation 04/22/16   Time 6   Period Weeks   Status New     PT LONG TERM GOAL #4   Title Independent  in HEP 04/22/16   Time 6   Period Weeks   Status New     PT LONG TERM GOAL #5   Title Improve FOTO to </= 52% limitaion 04/22/16   Time 6   Period Weeks   Status New     Additional Long Term Goals   Additional Long Term Goals Yes     PT LONG TERM GOAL #6   Title Improve patellar alignment on Rt allowing patient to progress with strengthening exercises for Rt LE 04/22/16   Time 6   Period Weeks   Status New             Patient will benefit from skilled therapeutic intervention in order to improve the following deficits and impairments:     Visit Diagnosis: Pain in left shoulder - Plan: PT plan of care cert/re-cert  Pain in right knee - Plan: PT plan of care cert/re-cert  Other symptoms and signs involving the musculoskeletal system - Plan: PT plan of care cert/re-cert     Problem List Patient Active Problem List   Diagnosis Date Noted  . Iron deficiency anemia 03/04/2016  . Dyslipidemia  03/04/2016  . Left shoulder pain 03/03/2016  . Right knee pain 03/03/2016  . Frozen shoulder 03/03/2016  . Pre-diabetes 11/01/2015  . Asthma with acute exacerbation 05/28/2015  . Acute sinusitis 05/28/2015  . Allergic rhinitis 05/28/2015  . Morbid obesity with BMI of 40.0-44.9, adult ([place]) 05/24/2013  . Ileitis 05/23/2013  . Chest pain, atypical 11/30/2012  . Sinus tachycardia ([place]) 07/14/2012  . HTN (hypertension) 07/14/2012  . Polycystic ovary disease 07/14/2012  . Obstructive sleep apnea (adult) (pediatric) 02/04/2012  . Hot flashes 11/10/2011  . Disturbance in sleep behavior 09/16/2011  . Asthma 05/23/2011  . Backache 05/23/2011  . Bipolar disorder in partial remission ([place]) 05/23/2011  . Dysphagia 05/23/2011  . Esophageal reflux 05/23/2011  . Osteoarthrosis, unspecified whether generalized or localized, lower leg 05/23/2011    Celyn Nilda Simmer PT, MPH  03/11/2016, 1:20 PM  [facility] [place], [place], [address] Phone: [phone]   Fax:  [phone]  Name: [name] MRN: [id number] Date of Birth: 12/08/1965

## 2016-03-13 ENCOUNTER — Ambulatory Visit (INDEPENDENT_AMBULATORY_CARE_PROVIDER_SITE_OTHER): Payer: Medicare Other | Admitting: Family Medicine

## 2016-03-13 VITALS — BP 159/79 | HR 121 | Wt 259.0 lb

## 2016-03-13 DIAGNOSIS — R7989 Other specified abnormal findings of blood chemistry: Secondary | ICD-10-CM

## 2016-03-13 DIAGNOSIS — M25512 Pain in left shoulder: Secondary | ICD-10-CM

## 2016-03-13 DIAGNOSIS — M7502 Adhesive capsulitis of left shoulder: Secondary | ICD-10-CM

## 2016-03-13 DIAGNOSIS — R232 Flushing: Secondary | ICD-10-CM

## 2016-03-13 DIAGNOSIS — N951 Menopausal and female climacteric states: Secondary | ICD-10-CM | POA: Diagnosis not present

## 2016-03-13 DIAGNOSIS — Z23 Encounter for immunization: Secondary | ICD-10-CM

## 2016-03-13 DIAGNOSIS — R946 Abnormal results of thyroid function studies: Secondary | ICD-10-CM | POA: Diagnosis not present

## 2016-03-13 NOTE — Patient Instructions (Signed)
Thank you for coming in today. Get labs today.  You should hear from OBGYN.  Return in a few months.

## 2016-03-13 NOTE — Progress Notes (Signed)
[name] is a 50 y.o. female who presents to [facility] [place] today for hot flashes. Patient notes hot flashes fatigue and brittle hair. This is been ongoing for several months. Prior to her first visit a few weeks ago here she had a partial workup with PCP. She had an appropriate withdrawal bleed after stopping birth control pills as per her previous PCP.  She had labs additionally that showed anemia, microcytosis, normal TSH but elevated T3.  His part of her initial workup with her first visit with me on August 28 she had labs that showed low to normal LH and FSH low progesterone and low to normal estrogens. She is concerned that she has perimenopausal symptoms. She denies significant fevers or chills.  Additionally she had a workup that showed decreased TIBC saturation  Additionally at the visit on August 28 she was diagnosed with adhesive capsulitis with her left shoulder and had a glenohumeral steroid injection as well as physical therapy. She's feeling much better from that standpoint.   Past Medical History:  Diagnosis Date  . Allergy   . Asthma    History of Asthma  . Bipolar disorder ([place])   . Chest pain, atypical 11/30/2012  . Depression   . H/O degenerative disc disease    L4-L5, L5-S1  . Hyperglycemia    Postoperative hyperglycemia  . Hypertension   . Obesity 07/14/2012  . OSA (obstructive sleep apnea)    mild  . Polycystic disease, ovaries   . Postoperative anemia   . Reflux   . Sinus tachycardia ([place]) 07/14/2012   Past Surgical History:  Procedure Laterality Date  . NASAL SINUS SURGERY  2004  . OVARIAN CYST REMOVAL  1998   A cyst on the fallopian tube removed  . SPINAL FUSION    . TONSILLECTOMY  1996   Social History  Substance Use Topics  . Smoking status: Never Smoker  . Smokeless tobacco: Not on file  . Alcohol use No   family history  includes Cancer in her father, maternal grandmother, and paternal grandmother.  ROS as above:  Medications: Current Outpatient Prescriptions  Medication Sig Dispense Refill  . albuterol (PROAIR HFA) 108 (90 BASE) MCG/ACT inhaler Inhale 2 puffs into the lungs every 4 (four) hours as needed for wheezing or shortness of breath.    Marland Kitchen albuterol (PROVENTIL) (2.5 MG/3ML) 0.083% nebulizer solution Take 3 mLs (2.5 mg total) by nebulization every 4 (four) hours as needed for wheezing or shortness of breath. 75 mL 1  . ALPRAZolam (XANAX) 1 MG tablet Take 1 mg by mouth.    . bisoprolol (ZEBETA) 5 MG tablet TAKE 1 TABLET (5 MG TOTAL) BY MOUTH DAILY. 90 tablet 0  . buPROPion (WELLBUTRIN XL) 150 MG 24 hr tablet     . clonazePAM (KLONOPIN) 1 MG tablet Take 2 mg by mouth at bedtime.  5  . fentaNYL (DURAGESIC - DOSED MCG/HR) 50 MCG/HR     . fluticasone (FLONASE) 50 MCG/ACT nasal spray Place 1 spray into the nose daily.    . hydrochlorothiazide (MICROZIDE) 12.5 MG capsule TAKE 1 TABLET (12.5 MG TOTAL) BY MOUTH DAILY.  3  . HYDROmorphone (DILAUDID) 2 MG tablet Take 2 mg by mouth every 6 (six) hours as needed for severe pain.    Marland Kitchen levonorgestrel-ethinyl estradiol (SEASONALE,INTROVALE,JOLESSA) 0.15-0.03 MG tablet TAKE 1 TABLET BY MOUTH DAILY.    . metFORMIN (GLUCOPHAGE) 1000 MG tablet Take 1,000 mg by mouth  2 (two) times daily with a meal.    . mometasone-formoterol (DULERA) 200-5 MCG/ACT AERO Inhale 2 puffs into the lungs 2 (two) times daily. 1 Inhaler 5  . nortriptyline (PAMELOR) 75 MG capsule Take 75 mg by mouth.    Marland Kitchen omeprazole (PRILOSEC) 20 MG capsule Take 20 mg by mouth.    Marland Kitchen oxymorphone (OPANA) 5 MG tablet Take 5 mg by mouth.    . perphenazine (TRILAFON) 4 MG tablet     . temazepam (RESTORIL) 30 MG capsule Take 30 mg by mouth at bedtime as needed for sleep.    . traZODone (DESYREL) 100 MG tablet Take 300 mg by mouth.     Current Facility-Administered Medications  Medication Dose Route Frequency Provider  Last Rate Last Dose  . methylPREDNISolone acetate (DEPO-MEDROL) injection 80 mg  80 mg Intramuscular Once Adelina Mings, MD      . predniSONE (DELTASONE) tablet 10 mg  10 mg Oral UD Adelina Mings, MD       Allergies  Allergen Reactions  . Shellfish Allergy Anaphylaxis  . Erythromycin Swelling  . Gabapentin Nausea And Vomiting  . Latex Rash  . Pregabalin Swelling  . Duloxetine Itching    Unknown reaction  . Neuromuscular Blocking Agents      Exam:  BP (!) 159/79   Pulse (!) 121   Wt 259 lb (117.5 kg)   BMI 41.80 kg/m  Gen: Well NAD HEENT: EOMI,  MMM No goiter Lungs: Normal work of breathing. CTABL Heart: RRR no MRG Abd: NABS, Soft. Nondistended, Nontender Exts: Brisk capillary refill, warm and well perfused. Left shoulder: Normal-appearing abduction to 120 external rotation to 60 beyond the neutral Position.     No results found for this or any previous visit (from the past 24 hour(s)). No results found.    Assessment and Plan: 50 y.o. female with  Hot flash brittle hair and fatigue. Concerning for a variety of etiologies including having menopausal symptoms, hyperthyroidism, anemia.  Plan to refer to OB/GYN for possible perimenopausal symptoms and possible hormone replacement therapy.  Will extend thyroid workup by repeating TSH free T3 and free T4 for concern for hyperthyroidism. Additionally recommend continuing oral iron replacement.  Call patient with results. Continue physical therapy for shoulder.   Flu vaccine given today prior to discharge.  Orders Placed This Encounter  Procedures  . T3, free  . T4, free  . TSH  . Ambulatory referral to Obstetrics / Gynecology    Referral Priority:   Routine    Referral Type:   Consultation    Referral Reason:   Specialty Services Required    Requested Specialty:   Obstetrics and Gynecology    Number of Visits Requested:   1    Discussed warning signs or symptoms. Please see discharge  instructions. Patient expresses understanding.

## 2016-03-14 LAB — T4, FREE: Free T4: 1.3 ng/dL (ref 0.8–1.8)

## 2016-03-14 LAB — T3, FREE: T3, Free: 2.7 pg/mL (ref 2.3–4.2)

## 2016-03-14 LAB — TSH: TSH: 1.32 mIU/L

## 2016-03-18 ENCOUNTER — Ambulatory Visit (INDEPENDENT_AMBULATORY_CARE_PROVIDER_SITE_OTHER): Payer: Medicare Other | Admitting: Rehabilitative and Restorative Service Providers"

## 2016-03-18 ENCOUNTER — Encounter: Payer: Self-pay | Admitting: Rehabilitative and Restorative Service Providers"

## 2016-03-18 DIAGNOSIS — M25561 Pain in right knee: Secondary | ICD-10-CM | POA: Diagnosis not present

## 2016-03-18 DIAGNOSIS — R29898 Other symptoms and signs involving the musculoskeletal system: Secondary | ICD-10-CM

## 2016-03-18 DIAGNOSIS — M25512 Pain in left shoulder: Secondary | ICD-10-CM

## 2016-03-18 NOTE — Patient Instructions (Addendum)
Side Bend, Sitting    Sit, head in comfortable, centered position, chin slightly tucked. Gently tilt head, bringing ear toward same-side shoulder. Hold _10__ seconds.  Repeat 3-5___ times per session. Do _4-5__ sessions per day.   Scapula Adduction With Pectoralis Stretch: Low - Standing   Shoulders at 45 hands even with shoulders, keeping weight through legs, shift weight forward until you feel pull or stretch through the front of your chest. Hold _30__ seconds. Do _3__ times, _2-4__ times per day.   Scapula Adduction With Pectoralis Stretch: Mid-Range - Standing   Shoulders at 90 elbows even with shoulders, keeping weight through legs, shift weight forward until you feel pull or strength through the front of your chest. Hold __30_ seconds. Do _3__ times, __2-4_ times per day.   Scapula Adduction With Pectoralis Stretch: High - Standing   Shoulders at 120 hands up high on the doorway, keeping weight on feet, shift weight forward until you feel pull or stretch through the front of your chest. Hold _30__ seconds. Do _3__ times, _2-3__ times per day.   Resisted External Rotation: in Neutral - Bilateral   PALMS UP Sit or stand, tubing in both hands, elbows at sides, bent to 90, forearms forward. Pinch shoulder blades together and rotate forearms out. Keep elbows at sides. Repeat __10__ times per set. Do _2-3___ sets per session. Do _2-3___ sessions per day.    Straight Leg Raise: With External Leg Rotation    Lie on back with right leg straight, opposite leg bent. Rotate straight leg out and lift __8-10_ inches. Repeat __10__ times per set. Do __1-3__ sets per session. Do _1___ sessions per day.    Strengthening: Hip Adduction - Isometric   Lying on back With ball or folded pillow between knees, squeeze knees together. Lift hips up. Hold __10__ seconds. Repeat _10___ times per set. Do __1-3__ sets per session. Do __1__ sessions per day.

## 2016-03-18 NOTE — Therapy (Signed)
[facility] [place] [address] [place], [place], [address] Phone: [phone]   Fax:  [phone]  Physical Therapy Treatment  Patient Details  Name: [name] MRN: [id number] Date of Birth: 01-Jun-1966 Referring Provider: Dr. Clementeen Graham   Encounter Date: 03/18/2016      PT End of Session - 03/18/16 1019    Visit Number 2   Number of Visits 12   Date for PT Re-Evaluation 04/22/16   PT Start Time 1018   PT Stop Time 1115   PT Time Calculation (min) 57 min   Activity Tolerance Patient tolerated treatment well      Past Medical History:  Diagnosis Date  . Allergy   . Asthma    History of Asthma  . Bipolar disorder ([facility])   . Chest pain, atypical 11/30/2012  . Depression   . H/O degenerative disc disease    L4-L5, L5-S1  . Hyperglycemia    Postoperative hyperglycemia  . Hypertension   . Obesity 07/14/2012  . OSA (obstructive sleep apnea)    mild  . Polycystic disease, ovaries   . Postoperative anemia   . Reflux   . Sinus tachycardia ([facility]) 07/14/2012    Past Surgical History:  Procedure Laterality Date  . NASAL SINUS SURGERY  2004  . OVARIAN CYST REMOVAL  1998   A cyst on the fallopian tube removed  . SPINAL FUSION    . TONSILLECTOMY  1996    There were no vitals filed for this visit.      Subjective Assessment - 03/18/16 1024    Subjective Shoulder is feelin g some better with less pain and more ROM. Notices tightness and discomfort in the Lt neck area. She has been doing her exercises. Knee is still hurting. Tape helps - lasted about 4 days. Can tell a difference when the tape is not in place.    Currently in Pain? Yes   Pain Score 3    Pain Location Shoulder   Pain Orientation Left   Pain Descriptors / Indicators Nagging   Pain Type Acute pain   Pain Onset More than a month ago   Pain Frequency Intermittent   Multiple Pain Sites Yes   Pain Score 7   Pain Location Knee   Pain Orientation Right    Pain Descriptors / Indicators Throbbing   Pain Type Chronic pain   Pain Onset More than a month ago   Pain Frequency Intermittent                         [facility] Adult PT Treatment/Exercise - 03/18/16 0001      Knee/Hip Exercises: Supine   Bridges with Newman Pies Squeeze Strengthening;Both;10 reps   Straight Leg Raise with External Rotation Strengthening;Right;10 reps     Shoulder Exercises: Standing   Flexion AAROM;Left;5 reps  doorway slide 20 sec hold    Retraction Strengthening;Both;20 reps;Theraband   Theraband Level (Shoulder Retraction) Level 1 (Yellow)     Shoulder Exercises: Pulleys   Flexion --  10 x 10 sec hold    ABduction --  10 x 10 sec hold      Shoulder Exercises: Stretch   Other Shoulder Stretches chin tuck with lateral cervical flexion 10 sec x 3    Other Shoulder Stretches 3 way doorway stretch 30 sec x 2 each      Moist Heat Therapy   Number Minutes Moist Heat 15 Minutes   Moist Heat Location  Shoulder  Lt     Cryotherapy   Number Minutes Cryotherapy 15 Minutes   Cryotherapy Location Lumbar Spine;Hip;Knee  Rt   Type of Cryotherapy Ice pack     Manual Therapy   Kinesiotex --  taping for lateral patellar shift Rt                 PT Education - 03/18/16 1111    Education provided Yes   Education Details HEP   Person(s) Educated Patient   Methods Explanation;Tactile cues;Verbal cues;Demonstration;Handout   Comprehension Verbalized understanding;Returned demonstration;Verbal cues required;Tactile cues required             PT Long Term Goals - 03/18/16 1159      PT LONG TERM GOAL #1   Title Improve posture and alignment with patient to demonstrate more upright posture and alignment 04/22/16   Period Weeks   Status On-going     PT LONG TERM GOAL #2   Title Increased AROM Lt shoudler to equal or greater than Rt 04/22/16   Time 6   Period Weeks   Status On-going     PT LONG TERM GOAL #3   Title Improve functional  level Lt shoudler allowing patient to use Lt UE for ADL's without pain or limitation 04/22/16   Time 6   Period Weeks   Status On-going     PT LONG TERM GOAL #4   Title Independent in HEP 04/22/16   Time 6   Period Weeks   Status On-going     PT LONG TERM GOAL #5   Title Improve FOTO to </= 52% limitaion 04/22/16   Time 6   Period Weeks   Status On-going     PT LONG TERM GOAL #6   Title Improve patellar alignment on Rt allowing patient to progress with strengthening exercises for Rt LE 04/22/16   Time 6   Period Weeks   Status On-going               Plan - 03/18/16 1157    Clinical Impression Statement Good improvement in Lt shoulder pain and increase in mobility reported and observed. Rt knee pain continues to be increased. Tape was helpful with patient reporting that the tape stayed in place for ~4 days. She is consistent with HEP per report. Has a TENS unit for home use so deferred today. Added exercises for shoulder and knee without difficulty.    Rehab Potential Good   PT Frequency 1x / week   PT Duration 6 weeks   PT Treatment/Interventions Patient/family education;ADLs/Self Care Home Management;Neuromuscular re-education;Manual techniques;Dry needling;Cryotherapy;Electrical Stimulation;Iontophoresis 4mg /ml Dexamethasone;Moist Heat;Ultrasound;Therapeutic activities;Therapeutic exercise   PT Next Visit Plan progress with exercises for Lt shoulder mobilty and strength; patellar alignment and LE stregthening. Continue taping for patellar alignment.    Consulted and Agree with Plan of Care Patient      Patient will benefit from skilled therapeutic intervention in order to improve the following deficits and impairments:  Postural dysfunction, Improper body mechanics, Pain, Decreased range of motion, Decreased mobility, Decreased strength, Increased fascial restricitons, Increased muscle spasms, Decreased activity tolerance  Visit Diagnosis: Pain in left shoulder  Pain  in right knee  Other symptoms and signs involving the musculoskeletal system     Problem List Patient Active Problem List   Diagnosis Date Noted  . Iron deficiency anemia 03/04/2016  . Dyslipidemia 03/04/2016  . Left shoulder pain 03/03/2016  . Right knee pain 03/03/2016  . Frozen shoulder 03/03/2016  . Pre-diabetes  11/01/2015  . Asthma with acute exacerbation 05/28/2015  . Acute sinusitis 05/28/2015  . Allergic rhinitis 05/28/2015  . Morbid obesity with BMI of 40.0-44.9, adult ([place]) 05/24/2013  . Ileitis 05/23/2013  . Chest pain, atypical 11/30/2012  . Sinus tachycardia ([employer]) 07/14/2012  . HTN (hypertension) 07/14/2012  . Polycystic ovary disease 07/14/2012  . Obstructive sleep apnea (adult) (pediatric) 02/04/2012  . Hot flashes 11/10/2011  . Disturbance in sleep behavior 09/16/2011  . Asthma 05/23/2011  . Backache 05/23/2011  . Bipolar disorder in partial remission ([place]) 05/23/2011  . Dysphagia 05/23/2011  . Esophageal reflux 05/23/2011  . Osteoarthrosis, unspecified whether generalized or localized, lower leg 05/23/2011    Celyn Nilda Simmer PT, MPH  03/18/2016, 12:07 PM  [facility] [place] [address] [place], [place], [address] Phone: [phone]   Fax:  [phone]  Name: CARY VANDERVORT MRN: [id number] Date of Birth: 1966/05/17

## 2016-03-20 ENCOUNTER — Ambulatory Visit (INDEPENDENT_AMBULATORY_CARE_PROVIDER_SITE_OTHER): Payer: Medicare Other | Admitting: Physical Therapy

## 2016-03-20 DIAGNOSIS — R29898 Other symptoms and signs involving the musculoskeletal system: Secondary | ICD-10-CM

## 2016-03-20 DIAGNOSIS — M25561 Pain in right knee: Secondary | ICD-10-CM | POA: Diagnosis not present

## 2016-03-20 DIAGNOSIS — M25512 Pain in left shoulder: Secondary | ICD-10-CM | POA: Diagnosis not present

## 2016-03-20 NOTE — Therapy (Signed)
[place], [place], [address] Phone: [phone]   Fax:  [phone]  Physical Therapy Treatment  Patient Details  Name: [name] MRN: [id number] Date of Birth: Oct 18, 1965 Referring Provider: Dr. [name]   Encounter Date: 03/20/2016      PT End of Session - 03/20/16 1017    Visit Number 3   Number of Visits 12   Date for PT Re-Evaluation 04/22/16   PT Start Time 1018   PT Stop Time 1120   PT Time Calculation (min) 62 min      Past Medical History:  Diagnosis Date  . Allergy   . Asthma    History of Asthma  . Bipolar disorder ([place])   . Chest pain, atypical 11/30/2012  . Depression   . H/O degenerative disc disease    L4-L5, L5-S1  . Hyperglycemia    Postoperative hyperglycemia  . Hypertension   . Obesity 07/14/2012  . OSA (obstructive sleep apnea)    mild  . Polycystic disease, ovaries   . Postoperative anemia   . Reflux   . Sinus tachycardia ([place]) 07/14/2012    Past Surgical History:  Procedure Laterality Date  . NASAL SINUS SURGERY  2004  . OVARIAN CYST REMOVAL  1998   A cyst on the fallopian tube removed  . SPINAL FUSION    . TONSILLECTOMY  1996    There were no vitals filed for this visit.      Subjective Assessment - 03/20/16 1020    Subjective Pt reports that her low back and Rt knee are very sore today. the Lt side of her neck is sore too.    Patient Stated Goals get dome relief in the Lt shoulder pain and get it loosened up; strengthening muscles in the knee and get rid of some of the pain    Currently in Pain? Yes   Pain Score 8    Pain Location Back   Pain Orientation Lower   Pain Descriptors / Indicators Aching   Pain Type Acute pain   Pain Frequency Constant   Multiple Pain Sites Yes   Pain Score 7   Pain Location Knee   Pain Orientation Right   Pain Descriptors / Indicators Throbbing   Pain Type Chronic pain   Pain Frequency Intermittent   Pain Score 6   Pain Location Neck   Pain Orientation Left   Pain Descriptors / Indicators Nagging   Pain Type Acute pain   Pain Onset In the past 7 days   Pain Frequency Constant                         [facility] Adult PT Treatment/Exercise - 03/20/16 0001      Knee/Hip Exercises: Aerobic   Nustep L3 x5' arms and legs     Knee/Hip Exercises: Supine   Straight Leg Raise with External Rotation Strengthening;Both  2x8 with TA bracing     Shoulder Exercises: Supine   Horizontal ABduction Strengthening;Both;Theraband  2x8   Theraband Level (Shoulder Horizontal ABduction) Level 1 (Yellow)   External Rotation Strengthening;Both;Theraband  2x8     Modalities   Modalities Electrical Stimulation;Moist Heat     Moist Heat Therapy   Number Minutes Moist Heat 15 Minutes   Moist Heat Location Shoulder;Lumbar Spine;Knee  Lt shoulder/neck, Rt knee     Electrical Stimulation   Electrical Stimulation Location Lt shoulder, Rt knee, low back   Electrical  Stimulation Action premod   Electrical Stimulation Parameters to tolerance   Electrical Stimulation Goals Pain;Tone                     PT Long Term Goals - 03/18/16 1159      PT LONG TERM GOAL #1   Title Improve posture and alignment with patient to demonstrate more upright posture and alignment 04/22/16   Period Weeks   Status On-going     PT LONG TERM GOAL #2   Title Increased AROM Lt shoudler to equal or greater than Rt 04/22/16   Time 6   Period Weeks   Status On-going     PT LONG TERM GOAL #3   Title Improve functional level Lt shoudler allowing patient to use Lt UE for ADL's without pain or limitation 04/22/16   Time 6   Period Weeks   Status On-going     PT LONG TERM GOAL #4   Title Independent in HEP 04/22/16   Time 6   Period Weeks   Status On-going     PT LONG TERM GOAL #5   Title Improve FOTO to </= 52% limitaion 04/22/16   Time 6   Period Weeks   Status On-going     PT LONG  TERM GOAL #6   Title Improve patellar alignment on Rt allowing patient to progress with strengthening exercises for Rt LE 04/22/16   Time 6   Period Weeks   Status On-going               Plan - 03/20/16 1128    Clinical Impression Statement [name] came in more sore today in her low back from the bridging exercises and in her neck, Lt side more so than her shoulder. Tolerated todays exercise well and felt better after heat/stim.  Rt knee does well with taping.    Rehab Potential Good   PT Treatment/Interventions Patient/family education;ADLs/Self Care Home Management;Neuromuscular re-education;Manual techniques;Dry needling;Cryotherapy;Electrical Stimulation;Iontophoresis 4mg /ml Dexamethasone;Moist Heat;Ultrasound;Therapeutic activities;Therapeutic exercise   PT Next Visit Plan progress with exercises for Lt shoulder mobilty and strength; patellar alignment and LE stregthening. Continue taping for patellar alignment.    Consulted and Agree with Plan of Care Patient      Patient will benefit from skilled therapeutic intervention in order to improve the following deficits and impairments:  Postural dysfunction, Improper body mechanics, Pain, Decreased range of motion, Decreased mobility, Decreased strength, Increased fascial restricitons, Increased muscle spasms, Decreased activity tolerance  Visit Diagnosis: Pain in left shoulder  Pain in right knee  Other symptoms and signs involving the musculoskeletal system     Problem List Patient Active Problem List   Diagnosis Date Noted  . Iron deficiency anemia 03/04/2016  . Dyslipidemia 03/04/2016  . Left shoulder pain 03/03/2016  . Right knee pain 03/03/2016  . Frozen shoulder 03/03/2016  . Pre-diabetes 11/01/2015  . Asthma with acute exacerbation 05/28/2015  . Acute sinusitis 05/28/2015  . Allergic rhinitis 05/28/2015  . Morbid obesity with BMI of 40.0-44.9, adult ([place]) 05/24/2013  . Ileitis 05/23/2013  . Chest pain,  atypical 11/30/2012  . Sinus tachycardia ([place]) 07/14/2012  . HTN (hypertension) 07/14/2012  . Polycystic ovary disease 07/14/2012  . Obstructive sleep apnea (adult) (pediatric) 02/04/2012  . Hot flashes 11/10/2011  . Disturbance in sleep behavior 09/16/2011  . Asthma 05/23/2011  . Backache 05/23/2011  . Bipolar disorder in partial remission ([place]) 05/23/2011  . Dysphagia 05/23/2011  . Esophageal reflux 05/23/2011  . Osteoarthrosis, unspecified whether generalized  or localized, lower leg 05/23/2011    Jeral Pinch PT 03/20/2016, 11:31 AM  [facility] [place], [place], [address] Phone: [phone]   Fax:  [phone]  Name: [name] MRN: [id number] Date of Birth: 03-15-1966

## 2016-03-25 ENCOUNTER — Ambulatory Visit (INDEPENDENT_AMBULATORY_CARE_PROVIDER_SITE_OTHER): Payer: Medicare Other | Admitting: Physical Therapy

## 2016-03-25 DIAGNOSIS — R29898 Other symptoms and signs involving the musculoskeletal system: Secondary | ICD-10-CM | POA: Diagnosis not present

## 2016-03-25 DIAGNOSIS — M25561 Pain in right knee: Secondary | ICD-10-CM

## 2016-03-25 DIAGNOSIS — M25512 Pain in left shoulder: Secondary | ICD-10-CM | POA: Diagnosis not present

## 2016-03-25 NOTE — Therapy (Signed)
[name] [name], [place], [address] Phone: [phone]   Fax:  [phone]  Physical Therapy Treatment  Patient Details  Name: [name] MRN: [id number] Date of Birth: 1966/05/28 Referring Provider: Dr Steva Colder  Encounter Date: 03/25/2016      PT End of Session - 03/25/16 1026    Visit Number 4   Number of Visits 12   Date for PT Re-Evaluation 04/22/16   PT Start Time 1024   PT Stop Time 1119   PT Time Calculation (min) 55 min   Activity Tolerance Patient tolerated treatment well      Past Medical History:  Diagnosis Date  . Allergy   . Asthma    History of Asthma  . Bipolar disorder ([place])   . Chest pain, atypical 11/30/2012  . Depression   . H/O degenerative disc disease    L4-L5, L5-S1  . Hyperglycemia    Postoperative hyperglycemia  . Hypertension   . Obesity 07/14/2012  . OSA (obstructive sleep apnea)    mild  . Polycystic disease, ovaries   . Postoperative anemia   . Reflux   . Sinus tachycardia ([place]) 07/14/2012    Past Surgical History:  Procedure Laterality Date  . NASAL SINUS SURGERY  2004  . OVARIAN CYST REMOVAL  1998   A cyst on the fallopian tube removed  . SPINAL FUSION    . TONSILLECTOMY  1996    There were no vitals filed for this visit.      Subjective Assessment - 03/25/16 1056    Subjective SI joints hurt having injections end of Oct , feels a little better, the tape on her knee really helps.  She has been very sore in the Lt upper trap   Currently in Pain? Yes   Pain Score 7    Pain Location Neck   Pain Orientation Left   Pain Descriptors / Indicators Dull;Throbbing   Pain Type Acute pain   Pain Onset In the past 7 days   Pain Frequency Constant   Aggravating Factors  lifting, sleep   Pain Relieving Factors heat some            [facility] PT Assessment - 03/25/16 0001      Assessment   Medical Diagnosis Lt shoulder pain; frozen shoulder; Rt knee  pain    Referring Provider Dr Steva Colder   Onset Date/Surgical Date 12/06/15   Hand Dominance Right   Next MD Visit 03/31/16     AROM   Left Shoulder Flexion 161 Degrees   Left Shoulder ABduction 150 Degrees   Left Shoulder Internal Rotation 72 Degrees  seated arm abducted to 90   Left Shoulder External Rotation 75 Degrees  seated arm abducted to 90                     [facility] Adult PT Treatment/Exercise - 03/25/16 0001      Knee/Hip Exercises: Stretches   Passive Hamstring Stretch Both;2 reps;30 seconds  with straps     Knee/Hip Exercises: Standing   Step Down Right;2 sets;10 reps;Step Height: 8"  Rt foot on step   Other Standing Knee Exercises sti to stand without UE assist x 10     Shoulder Exercises: Stretch   Other Shoulder Stretches cervical side bend  stretch, door way low and mid     Modalities   Modalities Electrical Stimulation;Moist Heat     Moist Heat Therapy   Number  Minutes Moist Heat 15 Minutes   Moist Heat Location Cervical;Lumbar Spine;Knee     Electrical Stimulation   Electrical Stimulation Location cervical   Electrical Stimulation Action IFC   Electrical Stimulation Parameters to tolerance   Electrical Stimulation Goals Pain;Tone     Manual Therapy   Manual Therapy Joint mobilization;Soft tissue mobilization   Manual therapy comments grade III CPA 6-7,then Rt UPA mobs UPA C4-7   Soft tissue mobilization Lt upper trap                     PT Long Term Goals - 03/25/16 1049      PT LONG TERM GOAL #1   Title Improve posture and alignment with patient to demonstrate more upright posture and alignment 04/22/16   Status On-going     PT LONG TERM GOAL #2   Title Increased AROM Lt shoudler to equal or greater than Rt 04/22/16     PT LONG TERM GOAL #3   Title Improve functional level Lt shoudler allowing patient to use Lt UE for ADL's without pain or limitation 04/22/16   Time 6   Period Weeks   Status On-going     PT LONG  TERM GOAL #4   Title Independent in HEP 04/22/16   Time 6   Period Weeks   Status On-going     PT LONG TERM GOAL #5   Title Improve FOTO to </= 52% limitaion 04/22/16   Time 6   Period Weeks   Status On-going     PT LONG TERM GOAL #6   Title Improve patellar alignment on Rt allowing patient to progress with strengthening exercises for Rt LE 04/22/16   Time 6   Period Weeks   Status On-going               Plan - 03/25/16 1247    Clinical Impression Statement [name] reports that her low back is better than last week, her Lt upper trap is her biggest concern today.  She did have some trigger points in the trap and stiffness in her cervical spine. Her knee continues to do well with tape on     Rehab Potential Good   PT Frequency 1x / week   PT Duration 6 weeks   PT Treatment/Interventions Patient/family education;ADLs/Self Care Home Management;Neuromuscular re-education;Manual techniques;Dry needling;Cryotherapy;Electrical Stimulation;Iontophoresis 4mg /ml Dexamethasone;Moist Heat;Ultrasound;Therapeutic activities;Therapeutic exercise   PT Next Visit Plan progress with exercises for Lt shoulder mobilty and strength; patellar alignment and LE stregthening. Continue taping for patellar alignment.    Consulted and Agree with Plan of Care Patient      Patient will benefit from skilled therapeutic intervention in order to improve the following deficits and impairments:  Postural dysfunction, Improper body mechanics, Pain, Decreased range of motion, Decreased mobility, Decreased strength, Increased fascial restricitons, Increased muscle spasms, Decreased activity tolerance  Visit Diagnosis: Pain in left shoulder  Pain in right knee  Other symptoms and signs involving the musculoskeletal system     Problem List Patient Active Problem List   Diagnosis Date Noted  . Iron deficiency anemia 03/04/2016  . Dyslipidemia 03/04/2016  . Left shoulder pain 03/03/2016  . Right knee  pain 03/03/2016  . Frozen shoulder 03/03/2016  . Pre-diabetes 11/01/2015  . Asthma with acute exacerbation 05/28/2015  . Acute sinusitis 05/28/2015  . Allergic rhinitis 05/28/2015  . Morbid obesity with BMI of 40.0-44.9, adult ([place]) 05/24/2013  . Ileitis 05/23/2013  . Chest pain, atypical 11/30/2012  . Sinus tachycardia (  [place]) 07/14/2012  . HTN (hypertension) 07/14/2012  . Polycystic ovary disease 07/14/2012  . Obstructive sleep apnea (adult) (pediatric) 02/04/2012  . Hot flashes 11/10/2011  . Disturbance in sleep behavior 09/16/2011  . Asthma 05/23/2011  . Backache 05/23/2011  . Bipolar disorder in partial remission ([place]) 05/23/2011  . Dysphagia 05/23/2011  . Esophageal reflux 05/23/2011  . Osteoarthrosis, unspecified whether generalized or localized, lower leg 05/23/2011    Jeral Pinch PT  03/25/2016, 12:51 PM  [facility] [place], [place], [address] Phone: [phone]   Fax:  [phone]  Name: [name] MRN: [id number] Date of Birth: 12-May-1966

## 2016-03-26 ENCOUNTER — Ambulatory Visit (INDEPENDENT_AMBULATORY_CARE_PROVIDER_SITE_OTHER): Payer: Medicare Other | Admitting: Obstetrics & Gynecology

## 2016-03-26 ENCOUNTER — Encounter: Payer: Self-pay | Admitting: Obstetrics & Gynecology

## 2016-03-26 ENCOUNTER — Other Ambulatory Visit (HOSPITAL_COMMUNITY)
Admission: RE | Admit: 2016-03-26 | Discharge: 2016-03-26 | Disposition: A | Discharge status: Home / Self Care | Payer: Medicare Other | Source: Ambulatory Visit | Attending: Obstetrics & Gynecology | Admitting: Obstetrics & Gynecology

## 2016-03-26 VITALS — BP 120/76 | HR 104 | Ht 67.0 in | Wt 255.0 lb

## 2016-03-26 DIAGNOSIS — Z124 Encounter for screening for malignant neoplasm of cervix: Secondary | ICD-10-CM | POA: Diagnosis not present

## 2016-03-26 DIAGNOSIS — Z1151 Encounter for screening for human papillomavirus (HPV): Secondary | ICD-10-CM

## 2016-03-26 DIAGNOSIS — Z01419 Encounter for gynecological examination (general) (routine) without abnormal findings: Secondary | ICD-10-CM | POA: Diagnosis not present

## 2016-03-26 DIAGNOSIS — R300 Dysuria: Secondary | ICD-10-CM

## 2016-03-26 MED ORDER — VALACYCLOVIR HCL 1 G PO TABS: 2000.0000 mg | ORAL_TABLET | Freq: Two times a day (BID) | ORAL | 6 refills | Status: AC

## 2016-03-26 NOTE — Progress Notes (Signed)
Subjective:    [name] is a 50 y.o. SW P0 female who presents for an annual exam. The patient has no complaints today.She needs a pap and thinks that she has a UTI. She would like a prescription for Valtrex for fever blisters. The patient is sexually active. GYN screening history: last pap: was normal. The patient wears seatbelts: yes. The patient participates in regular exercise: yes. Has the patient ever been transfused or tattooed?: yes. The patient reports that there is not domestic violence in her life.   Menstrual History: OB History    Gravida Para Term Preterm AB Living   1       1     SAB TAB Ectopic Multiple Live Births                  Menarche age: 84 Patient's last menstrual period was 02/27/2016 (exact date).    The following portions of the patient's history were reviewed and updated as appropriate: allergies, current medications, past family history, past medical history, past social history, past surgical history and problem list.  Review of Systems Pertinent items are noted in HPI. She lives with her parents in [place] and cares for them, monogamous for 7 years. He has had a vasectomy. On disability for her back injuries. Had flu vaccine recently. Mammogram due. Has some hot flashes.   Objective:    BP 120/76   Pulse (!) 104   Ht 5\' 7"  (1.702 m)   Wt 255 lb (115.7 kg)   LMP 02/27/2016 (Exact Date)   BMI 39.94 kg/m   General Appearance:    Alert, cooperative, no distress, appears stated age  Head:    Normocephalic, without obvious abnormality, atraumatic  Eyes:    PERRL, conjunctiva/corneas clear, EOM's intact, fundi    benign, both eyes  Ears:    Normal TM's and external ear canals, both ears  Nose:   Nares normal, septum midline, mucosa normal, no drainage    or sinus tenderness  Throat:   Lips, mucosa, and tongue normal; teeth and gums normal  Neck:   Supple, symmetrical, trachea midline, no adenopathy;    thyroid:  no enlargement/tenderness/nodules;  no carotid   bruit or JVD  Back:     Symmetric, no curvature, ROM normal, no CVA tenderness  Lungs:     Clear to auscultation bilaterally, respirations unlabored  Chest Wall:    No tenderness or deformity   Heart:    Regular rate and rhythm, S1 and S2 normal, no murmur, rub   or gallop  Breast Exam:    No tenderness, masses, or nipple abnormality  Abdomen:     Soft, non-tender, bowel sounds active all four quadrants,    no masses, no organomegaly  Genitalia:    Normal female without lesion, discharge or tenderness, NSSA, NT, no palpable adnexal masses, mild vulvovaginal atrophy     Extremities:   Extremities normal, atraumatic, no cyanosis or edema  Pulses:   2+ and symmetric all extremities  Skin:   Skin color, texture, turgor normal, no rashes or lesions  Lymph nodes:   Cervical, supraclavicular, and axillary nodes normal  Neurologic:   CNII-XII intact, normal strength, sensation and reflexes    throughout   .    Assessment:    Healthy female exam.    Plan:     Thin prep Pap smear. with cotesting Mammogram Reassurance about perimenopause

## 2016-03-26 NOTE — Progress Notes (Signed)
Pt has already had her flu shot

## 2016-03-27 ENCOUNTER — Ambulatory Visit (INDEPENDENT_AMBULATORY_CARE_PROVIDER_SITE_OTHER): Payer: Medicare Other | Admitting: Physical Therapy

## 2016-03-27 DIAGNOSIS — R29898 Other symptoms and signs involving the musculoskeletal system: Secondary | ICD-10-CM

## 2016-03-27 DIAGNOSIS — M25512 Pain in left shoulder: Secondary | ICD-10-CM

## 2016-03-27 DIAGNOSIS — M25561 Pain in right knee: Secondary | ICD-10-CM

## 2016-03-27 LAB — CYTOLOGY - PAP

## 2016-03-27 NOTE — Therapy (Signed)
[place], [place], [address] Phone: [phone]   Fax:  [phone]  Physical Therapy Treatment  Patient Details  Name: [name] MRN: [id number] Date of Birth: February 20, 1966 Referring Provider: Dr. Georgina Snell  Encounter Date: 03/27/2016      PT End of Session - 03/27/16 1025    Visit Number 5   Number of Visits 12   Date for PT Re-Evaluation 04/22/16   PT Start Time 1018   PT Stop Time 1116   PT Time Calculation (min) 58 min   Activity Tolerance Patient tolerated treatment well;No increased pain      Past Medical History:  Diagnosis Date  . Allergy   . Asthma    History of Asthma  . Bipolar disorder ([place])   . Chest pain, atypical 11/30/2012  . Depression   . H/O degenerative disc disease    L4-L5, L5-S1  . Hyperglycemia    Postoperative hyperglycemia  . Hypertension   . Obesity 07/14/2012  . OSA (obstructive sleep apnea)    mild  . Polycystic disease, ovaries   . Postoperative anemia   . Reflux   . Sinus tachycardia ([place]) 07/14/2012    Past Surgical History:  Procedure Laterality Date  . NASAL SINUS SURGERY  2004  . OVARIAN CYST REMOVAL  1998   A cyst on the fallopian tube removed  . SPINAL FUSION    . TONSILLECTOMY  1996    There were no vitals filed for this visit.      Subjective Assessment - 03/27/16 1023    Subjective Pt reports she is sore and stiff this morning.  She states her Lt shoulder is much better since injection and some exercise.     Pertinent History 2006 fell sustaining fx of Rt tibial plateu treated with immobilization and NWB for 8 weeks. She has had multiple injections in the Rt knee and has had 2 symvisc injections; spinal fusion L4-S1 2005; bilat releases knees 2001; Rt knee I & D patella; 2 foot surgeries plantar fasciitis 2015 Rt x 2    Currently in Pain? Yes   Pain Score 8    Pain Location Back   Pain Orientation Right   Pain Descriptors /  Indicators Dull;Throbbing   Aggravating Factors  going up stairs    Pain Relieving Factors lay down, heat    Pain Score 7   Pain Location Knee   Pain Orientation Right   Aggravating Factors  stairs   Pain Relieving Factors elevation, rest            [facility] PT Assessment - 03/27/16 0001      Assessment   Medical Diagnosis Lt shoulder pain; frozen shoulder; Rt knee pain    Referring Provider Dr. Georgina Snell   Onset Date/Surgical Date 12/06/15   Hand Dominance Right   Next MD Visit 11/17          [facility] Adult PT Treatment/Exercise - 03/27/16 0001      Knee/Hip Exercises: Stretches   Passive Hamstring Stretch Both;2 reps;30 seconds  with straps   Quad Stretch 3 reps;30 seconds   Gastroc Stretch Right;Left;2 reps;30 seconds     Knee/Hip Exercises: Aerobic   Nustep L4: 5 min   arms/legs, slow speed     Shoulder Exercises: Supine   Horizontal ABduction Strengthening;Both;15 reps;Theraband   Theraband Level (Shoulder Horizontal ABduction) Level 2 (Red)   External Rotation Strengthening;Both;10 reps;Theraband  2 sets   Theraband Level (Shoulder External Rotation)  Level 2 (Red)   Flexion Strengthening;Both;15 reps;Theraband  overhead pull.    Theraband Level (Shoulder Flexion) Level 2 (Red)   Other Supine Exercises Sash with red band x 15 reps; VC for improved form.      Shoulder Exercises: Stretch   Other Shoulder Stretches Doorway stretch:  mid level, high level x 30 sec x 2 reps each - switched to unilateral for improved comfort.      Modalities   Modalities Cryotherapy;Electrical Stimulation;Moist Heat     Moist Heat Therapy   Number Minutes Moist Heat 15 Minutes   Moist Heat Location Cervical     Cryotherapy   Number Minutes Cryotherapy 15 Minutes   Cryotherapy Location Lumbar Spine   Type of Cryotherapy Ice pack     Electrical Stimulation   Electrical Stimulation Location upper trap bilat, thoracic paraspinals.    Electrical Stimulation Action IFC   Electrical  Stimulation Parameters to tolerance    Electrical Stimulation Goals Pain     Manual Therapy   Soft tissue mobilization Edge tool assistance to Rt lateral distal quad and prox lower leg to decrease fascial tightness, decrease pain.     Kinesiotex Inhibit Muscle     Kinesiotix   Inhibit Muscle  Dynamic tape:  X strip applied to Rt lateral quad and forming c shape around patella.  I strip pulled medially to assist with patellar tracking.                       PT Long Term Goals - 03/25/16 1049      PT LONG TERM GOAL #1   Title Improve posture and alignment with patient to demonstrate more upright posture and alignment 04/22/16   Status On-going     PT LONG TERM GOAL #2   Title Increased AROM Lt shoudler to equal or greater than Rt 04/22/16     PT LONG TERM GOAL #3   Title Improve functional level Lt shoudler allowing patient to use Lt UE for ADL's without pain or limitation 04/22/16   Time 6   Period Weeks   Status On-going     PT LONG TERM GOAL #4   Title Independent in HEP 04/22/16   Time 6   Period Weeks   Status On-going     PT LONG TERM GOAL #5   Title Improve FOTO to </= 52% limitaion 04/22/16   Time 6   Period Weeks   Status On-going     PT LONG TERM GOAL #6   Title Improve patellar alignment on Rt allowing patient to progress with strengthening exercises for Rt LE 04/22/16   Time 6   Period Weeks   Status On-going               Plan - 03/27/16 1102    Clinical Impression Statement Pt tolerated modified stretches today, noting decreased back pain afterward.  She tolerated increased resistance for shoulder exercise without increase in symptoms.  Rt knee pain reduced by 3 points by end of session.  Pt is progressing towards goals.    Rehab Potential Good   PT Frequency 2x / week  spoke to supervising PT regarding freq.    PT Duration 6 weeks   PT Treatment/Interventions Patient/family education;ADLs/Self Care Home Management;Neuromuscular  re-education;Manual techniques;Dry needling;Cryotherapy;Electrical Stimulation;Iontophoresis 4mg /ml Dexamethasone;Moist Heat;Ultrasound;Therapeutic activities;Therapeutic exercise   PT Next Visit Plan progress with exercises for Lt shoulder mobilty and strength; patellar alignment and LE stregthening. Continue taping for patellar alignment.  Consulted and Agree with Plan of Care Patient      Patient will benefit from skilled therapeutic intervention in order to improve the following deficits and impairments:  Postural dysfunction, Improper body mechanics, Pain, Decreased range of motion, Decreased mobility, Decreased strength, Increased fascial restricitons, Increased muscle spasms, Decreased activity tolerance  Visit Diagnosis: No diagnosis found.     Problem List Patient Active Problem List   Diagnosis Date Noted  . Iron deficiency anemia 03/04/2016  . Dyslipidemia 03/04/2016  . Left shoulder pain 03/03/2016  . Right knee pain 03/03/2016  . Frozen shoulder 03/03/2016  . Pre-diabetes 11/01/2015  . Asthma with acute exacerbation 05/28/2015  . Acute sinusitis 05/28/2015  . Allergic rhinitis 05/28/2015  . Morbid obesity with BMI of 40.0-44.9, adult ([place]) 05/24/2013  . Ileitis 05/23/2013  . Chest pain, atypical 11/30/2012  . Sinus tachycardia ([place]) 07/14/2012  . HTN (hypertension) 07/14/2012  . Polycystic ovary disease 07/14/2012  . Obstructive sleep apnea (adult) (pediatric) 02/04/2012  . Hot flashes 11/10/2011  . Disturbance in sleep behavior 09/16/2011  . Asthma 05/23/2011  . Backache 05/23/2011  . Bipolar disorder in partial remission ([place]) 05/23/2011  . Dysphagia 05/23/2011  . Esophageal reflux 05/23/2011  . Osteoarthrosis, unspecified whether generalized or localized, lower leg 05/23/2011   Kerin Perna, PTA 03/27/16 12:40 PM  [place], [place], [address] Phone: [phone]    Fax:  [phone]  Name: [name] MRN: [id number] Date of Birth: 26-Jun-1966

## 2016-03-28 LAB — URINE CULTURE

## 2016-03-31 ENCOUNTER — Ambulatory Visit: Payer: Medicare Other | Admitting: Family Medicine

## 2016-04-01 ENCOUNTER — Ambulatory Visit (INDEPENDENT_AMBULATORY_CARE_PROVIDER_SITE_OTHER): Payer: Medicare Other | Admitting: Rehabilitative and Restorative Service Providers"

## 2016-04-01 ENCOUNTER — Encounter: Payer: Self-pay | Admitting: Rehabilitative and Restorative Service Providers"

## 2016-04-01 DIAGNOSIS — M25561 Pain in right knee: Secondary | ICD-10-CM

## 2016-04-01 DIAGNOSIS — R29898 Other symptoms and signs involving the musculoskeletal system: Secondary | ICD-10-CM | POA: Diagnosis not present

## 2016-04-01 DIAGNOSIS — M25512 Pain in left shoulder: Secondary | ICD-10-CM

## 2016-04-01 NOTE — Therapy (Signed)
[place], [place], [address] Phone: [phone]   Fax:  [phone]  Physical Therapy Treatment  Patient Details  Name: [name] MRN: [id number] Date of Birth: 11/15/1965 Referring Provider: Dr. Georgina Snell   Encounter Date: 04/01/2016      PT End of Session - 04/01/16 1021    Visit Number 6   Number of Visits 12   Date for PT Re-Evaluation 04/22/16   PT Start Time 1016   PT Stop Time 1113   PT Time Calculation (min) 57 min   Activity Tolerance Patient tolerated treatment well      Past Medical History:  Diagnosis Date  . Allergy   . Asthma    History of Asthma  . Bipolar disorder ([place])   . Chest pain, atypical 11/30/2012  . Depression   . H/O degenerative disc disease    L4-L5, L5-S1  . Hyperglycemia    Postoperative hyperglycemia  . Hypertension   . Obesity 07/14/2012  . OSA (obstructive sleep apnea)    mild  . Polycystic disease, ovaries   . Postoperative anemia   . Reflux   . Sinus tachycardia ([place]) 07/14/2012    Past Surgical History:  Procedure Laterality Date  . NASAL SINUS SURGERY  2004  . OVARIAN CYST REMOVAL  1998   A cyst on the fallopian tube removed  . SPINAL FUSION    . TONSILLECTOMY  1996    There were no vitals filed for this visit.      Subjective Assessment - 04/01/16 1021    Subjective Increased neck and back pain. Can't even turn her head to look over shoulder to check traffic. Seen by pain management last week. MD says she should not do anything that hurts back. Increased LBP - notices pain with bridge with ball squeeze but does not notice other exercises that itrritate the back.    Currently in Pain? Yes   Pain Score 6    Pain Location Back   Pain Orientation Right   Pain Descriptors / Indicators Dull;Throbbing   Pain Score 5   Pain Location Knee   Pain Orientation Right   Pain Type Chronic pain   Pain Onset More than a month ago   Pain  Frequency Intermittent   Pain Score 9   Pain Location Neck   Pain Orientation Left   Pain Descriptors / Indicators Nagging   Pain Type Acute pain   Pain Onset 1 to 4 weeks ago   Pain Frequency Constant            [facility] PT Assessment - 04/01/16 0001      Assessment   Medical Diagnosis Lt shoulder pain; frozen shoulder; Rt knee pain    Referring Provider Dr. Georgina Snell    Onset Date/Surgical Date 12/06/15   Hand Dominance Right   Next MD Visit 11/17     Flexibility   Hamstrings tight Rt 65 deg; Lt 70 deg      Palpation   Patella mobility hypomobile Rt > Lt Rt patella superior and lateral in orientation    Palpation comment tender and tight upper trap; pecs; leveator; deltoid Lt > Rt shoulder; lateral quads/lateral patella Rt > Lt                      [facility] Adult PT Treatment/Exercise - 04/01/16 0001      Knee/Hip Exercises: Stretches   Passive Hamstring Stretch Both;2 reps;30 seconds  with  straps     Knee/Hip Exercises: Aerobic   Nustep L5: 5 min   arms/legs, slow speed     Knee/Hip Exercises: Standing   Other Standing Knee Exercises sti to stand without UE assist x 10     Knee/Hip Exercises: Seated   Ball Squeeze small orange ball 10 x 2 sets 2 sec hold - verbal cues for foot placement     Shoulder Exercises: Seated   Row Strengthening;Both;Theraband   Theraband Level (Shoulder Row) Level 2 (Red)   External Rotation Strengthening;Both;10 reps;Theraband   Theraband Level (Shoulder External Rotation) Level 1 (Yellow)   Other Seated Exercises lateral cervical flexion 20 sec x 3 each side      Modalities   Modalities Cryotherapy;Electrical Stimulation;Moist Heat     Moist Heat Therapy   Number Minutes Moist Heat 15 Minutes   Moist Heat Location Cervical     Cryotherapy   Number Minutes Cryotherapy 15 Minutes   Cryotherapy Location Lumbar Spine   Type of Cryotherapy Ice pack     Electrical Stimulation   Electrical Stimulation Location Rt lateral  cervical/upper trap    Electrical Stimulation Action IFC   Electrical Stimulation Parameters to tolerance   Electrical Stimulation Goals Pain;Tone     Manual Therapy   Manual therapy comments grade III CPA; Rt UPA mobs    Soft tissue mobilization pt supine - working through the ant/lat/post cervical musculature; upper traps primarily Rt    Kinesiotex Inhibit Muscle     Kinesiotix   Inhibit Muscle  X strip applied to Rt lateral quad and forming c shape around patella.  I strip pulled medially to assist with patellar tracking.                       PT Long Term Goals - 04/01/16 1300      PT LONG TERM GOAL #1   Title Improve posture and alignment with patient to demonstrate more upright posture and alignment 04/22/16   Time 6   Period Weeks   Status On-going     PT LONG TERM GOAL #2   Title Increased AROM Lt shoudler to equal or greater than Rt 04/22/16   Time 6   Period Weeks   Status On-going     PT LONG TERM GOAL #3   Title Improve functional level Lt shoudler allowing patient to use Lt UE for ADL's without pain or limitation 04/22/16   Time 6   Period Weeks   Status On-going     PT LONG TERM GOAL #4   Title Independent in HEP 04/22/16   Time 6   Period Weeks   Status On-going     PT LONG TERM GOAL #5   Title Improve FOTO to </= 52% limitaion 04/22/16   Time 6   Period Weeks   Status On-going     PT LONG TERM GOAL #6   Title Improve patellar alignment on Rt allowing patient to progress with strengthening exercises for Rt LE 04/22/16   Time 6   Period Weeks   Status On-going               Plan - 04/01/16 1257    Clinical Impression Statement Patient continues to report pain through the Rt cervical and shoulder girdle area as well as LB and Rt knee. She is modifying exercises for LB to prevent pain. Not sure patient is progressing with rehab. Consider holding therpay for now with patient to continue with  I HEP and contact us wihtin 30 days if  she wants to cointinue with PT at this time. She may want to safe visits in case she has surgery on Rt knee.    Rehab Potential Good   PT Frequency 2x / week   PT Duration 6 weeks   PT Treatment/Interventions Patient/family education;ADLs/Self Care Home Management;Neuromuscular re-education;Manual techniques;Dry needling;Cryotherapy;Electrical Stimulation;Iontophoresis 4mg /ml Dexamethasone;Moist Heat;Ultrasound;Therapeutic activities;Therapeutic exercise   PT Next Visit Plan progress with exercises for Lt shoulder mobilty and strength; patellar alignment and LE stregthening. Continue taping for patellar alignment. Consider holding PT for now for trial of HEP and walking program independently.   Consulted and Agree with Plan of Care Patient      Patient will benefit from skilled therapeutic intervention in order to improve the following deficits and impairments:  Postural dysfunction, Improper body mechanics, Pain, Decreased range of motion, Decreased mobility, Decreased strength, Increased fascial restricitons, Increased muscle spasms, Decreased activity tolerance  Visit Diagnosis: Pain in left shoulder  Pain in right knee  Other symptoms and signs involving the musculoskeletal system     Problem List Patient Active Problem List   Diagnosis Date Noted  . Iron deficiency anemia 03/04/2016  . Dyslipidemia 03/04/2016  . Left shoulder pain 03/03/2016  . Right knee pain 03/03/2016  . Frozen shoulder 03/03/2016  . Pre-diabetes 11/01/2015  . Asthma with acute exacerbation 05/28/2015  . Acute sinusitis 05/28/2015  . Allergic rhinitis 05/28/2015  . Morbid obesity with BMI of 40.0-44.9, adult ([place]) 05/24/2013  . Ileitis 05/23/2013  . Chest pain, atypical 11/30/2012  . Sinus tachycardia ([place]) 07/14/2012  . HTN (hypertension) 07/14/2012  . Polycystic ovary disease 07/14/2012  . Obstructive sleep apnea (adult) (pediatric) 02/04/2012  . Hot flashes 11/10/2011  . Disturbance in sleep  behavior 09/16/2011  . Asthma 05/23/2011  . Backache 05/23/2011  . Bipolar disorder in partial remission ([place]) 05/23/2011  . Dysphagia 05/23/2011  . Esophageal reflux 05/23/2011  . Osteoarthrosis, unspecified whether generalized or localized, lower leg 05/23/2011    Oleda Borski Nilda Simmer PT, MPH  04/01/2016, 1:02 PM  [facility] [place], [place], [address] Phone: [phone]   Fax:  [phone]  Name: [name] MRN: [id number] Date of Birth: 12-25-1965

## 2016-04-03 ENCOUNTER — Encounter: Payer: Medicare Other | Admitting: Physical Therapy

## 2016-04-07 ENCOUNTER — Encounter: Payer: Self-pay | Admitting: Family Medicine

## 2016-04-08 ENCOUNTER — Ambulatory Visit (INDEPENDENT_AMBULATORY_CARE_PROVIDER_SITE_OTHER): Payer: Medicare Other | Admitting: Physical Therapy

## 2016-04-08 DIAGNOSIS — M25561 Pain in right knee: Secondary | ICD-10-CM | POA: Diagnosis not present

## 2016-04-08 DIAGNOSIS — R29898 Other symptoms and signs involving the musculoskeletal system: Secondary | ICD-10-CM | POA: Diagnosis not present

## 2016-04-08 DIAGNOSIS — M25512 Pain in left shoulder: Secondary | ICD-10-CM

## 2016-04-08 NOTE — Therapy (Signed)
[place], [place], [address] Phone: [phone]   Fax:  [phone]  Physical Therapy Treatment  Patient Details  Name: [name] MRN: [id number] Date of Birth: 1965/12/23 Referring Provider: Dr.Corey   Encounter Date: 04/08/2016      PT End of Session - 04/08/16 0958    Visit Number 7   Number of Visits 12   Date for PT Re-Evaluation 04/22/16   PT Start Time 4128   PT Stop Time 1033   PT Time Calculation (min) 59 min      Past Medical History:  Diagnosis Date  . Allergy   . Asthma    History of Asthma  . Bipolar disorder ([place])   . Chest pain, atypical 11/30/2012  . Depression   . H/O degenerative disc disease    L4-L5, L5-S1  . Hyperglycemia    Postoperative hyperglycemia  . Hypertension   . Obesity 07/14/2012  . OSA (obstructive sleep apnea)    mild  . Polycystic disease, ovaries   . Postoperative anemia   . Reflux   . Sinus tachycardia 07/14/2012    Past Surgical History:  Procedure Laterality Date  . NASAL SINUS SURGERY  2004  . OVARIAN CYST REMOVAL  1998   A cyst on the fallopian tube removed  . SPINAL FUSION    . TONSILLECTOMY  1996    There were no vitals filed for this visit.      Subjective Assessment - 04/08/16 0940    Subjective Pt reports things are feeling a little better.  She had stomach bug last wk.  She can now turn her head without much difficulty.     Pertinent History 2006 fell sustaining fx of Rt tibial plateu treated with immobilization and NWB for 8 weeks. She has had multiple injections in the Rt knee and has had 2 symvisc injections; spinal fusion L4-S1 2005; bilat releases knees 2001; Rt knee I & D patella; 2 foot surgeries plantar fasciitis 2015 Rt x 2    Patient Stated Goals get some relief in the Lt shoulder pain and get it loosened up; strengthening muscles in the knee and get rid of some of the pain    Currently in Pain? Yes   Pain Score 6     Pain Location Back   Pain Orientation Right   Pain Descriptors / Indicators Dull   Aggravating Factors  going up stairs    Pain Relieving Factors heat    Pain Score 3   Pain Location Shoulder   Pain Orientation Left   Pain Descriptors / Indicators Aching   Aggravating Factors  picking up heavy objects.    Pain Relieving Factors heat            [facility] PT Assessment - 04/08/16 0001      Assessment   Medical Diagnosis Lt shoulder pain; frozen shoulder; Rt knee pain    Referring Provider Dr.Corey    Onset Date/Surgical Date 12/06/15   Hand Dominance Right   Next MD Visit 11/17     AROM   Right Shoulder Flexion 152 Degrees  in sitting   Right Shoulder ABduction --  151   Right Shoulder Internal Rotation 60 Degrees   Right Shoulder External Rotation 90 Degrees  supine   Left Shoulder Flexion 151 Degrees  in sitting    Left Shoulder ABduction --  151   Left Shoulder Internal Rotation 50 Degrees  supine   Left Shoulder External  Rotation 80 Degrees  supine            [facility] Adult PT Treatment/Exercise - 04/08/16 0001      Exercises   Exercises Neck     Knee/Hip Exercises: Stretches   Passive Hamstring Stretch Right;Left;3 reps;30 seconds   ITB Stretch Right;Left;2 reps;30 seconds     Knee/Hip Exercises: Aerobic   Nustep L5: 5 min   arms/legs     Shoulder Exercises: Supine   Horizontal ABduction Strengthening;Both;15 reps   Theraband Level (Shoulder Horizontal ABduction) Level 3 (Green)   External Rotation Strengthening;Both;15 reps;Theraband   Theraband Level (Shoulder External Rotation) Level 3 (Green)   Flexion Strengthening;Both;15 reps;Theraband  overhead pull.    Theraband Level (Shoulder Flexion) Level 3 (Green)   Other Supine Exercises Sash with green band (each arm) x 15 reps.     Shoulder Exercises: Stretch   Cross Chest Stretch 2 reps;20 seconds   Other Shoulder Stretches Doorway stretch:  mid level, high level x 30 sec x 2 reps each -  switched to unilateral for improved comfort.      Modalities   Modalities Moist Heat;Electrical Stimulation     Moist Heat Therapy   Number Minutes Moist Heat 15 Minutes   Moist Heat Location Cervical     Electrical Stimulation   Electrical Stimulation Location Lt lateral cervical/upper trap    Electrical Stimulation Action IFC   Electrical Stimulation Parameters to tolerance    Electrical Stimulation Goals Tone;Pain     Kinesiotix   Inhibit Muscle  X strip applied to Rt lateral quad and forming c shape around patella.  I strip pulled medially to assist with patellar tracking.       Neck Exercises: Stretches   Upper Trapezius Stretch 3 reps;20 seconds                PT Education - 04/08/16 1309    Education provided Yes   Education Details Pt issued green band to use for existing HEP    Person(s) Educated Patient   Methods Explanation   Comprehension Verbalized understanding;Returned demonstration             PT Long Term Goals - 04/08/16 1004      PT LONG TERM GOAL #1   Title Improve posture and alignment with patient to demonstrate more upright posture and alignment 04/22/16   Time 6   Period Weeks   Status On-going     PT LONG TERM GOAL #2   Title Increased AROM Lt shoudler to equal or greater than Rt 04/22/16   Time 6   Period Weeks   Status Partially Met     PT LONG TERM GOAL #3   Title Improve functional level Lt shoulder allowing patient to use Lt UE for ADL's without pain or limitation 04/22/16   Time 6   Period Weeks   Status On-going     PT LONG TERM GOAL #4   Title Independent in HEP 04/22/16   Time 6   Period Weeks   Status On-going     PT LONG TERM GOAL #5   Title Improve FOTO to </= 52% limitaion 04/22/16   Time 6   Period Weeks   Status On-going     PT LONG TERM GOAL #6   Title Improve patellar alignment on Rt allowing patient to progress with strengthening exercises for Rt LE 04/22/16   Time 6   Period Weeks   Status  On-going  Plan - 04/08/16 0958    Clinical Impression Statement Pt's Lt shoulder motion has improved; continued tightness in Lt ER and IR.  She has partially met LTG #2. She tolerated shoulder exercises with increased resistance without increase in pain.  Pt continues to have positive effect with tape; may teach pt on technique next visit.  Progressing towards unmet goals.    Rehab Potential Good   PT Frequency 2x / week   PT Duration 6 weeks   PT Treatment/Interventions Patient/family education;ADLs/Self Care Home Management;Neuromuscular re-education;Manual techniques;Dry needling;Cryotherapy;Electrical Stimulation;Iontophoresis 78m/ml Dexamethasone;Moist Heat;Ultrasound;Therapeutic activities;Therapeutic exercise   PT Next Visit Plan progress with exercises for Lt shoulder mobilty and strength; patellar alignment and LE stregthening. Continue taping for patellar alignment.    Consulted and Agree with Plan of Care Patient      Patient will benefit from skilled therapeutic intervention in order to improve the following deficits and impairments:  Postural dysfunction, Improper body mechanics, Pain, Decreased range of motion, Decreased mobility, Decreased strength, Increased fascial restricitons, Increased muscle spasms, Decreased activity tolerance  Visit Diagnosis: Left shoulder pain, unspecified chronicity  Right knee pain, unspecified chronicity  Other symptoms and signs involving the musculoskeletal system     Problem List Patient Active Problem List   Diagnosis Date Noted  . Iron deficiency anemia 03/04/2016  . Dyslipidemia 03/04/2016  . Left shoulder pain 03/03/2016  . Right knee pain 03/03/2016  . Frozen shoulder 03/03/2016  . Pre-diabetes 11/01/2015  . Asthma with acute exacerbation 05/28/2015  . Acute sinusitis 05/28/2015  . Allergic rhinitis 05/28/2015  . Morbid obesity with BMI of 40.0-44.9, adult ([place] 05/24/2013  . Ileitis 05/23/2013  . Chest  pain, atypical 11/30/2012  . Sinus tachycardia 07/14/2012  . HTN (hypertension) 07/14/2012  . Polycystic ovary disease 07/14/2012  . Obstructive sleep apnea (adult) (pediatric) 02/04/2012  . Hot flashes 11/10/2011  . Disturbance in sleep behavior 09/16/2011  . Asthma 05/23/2011  . Backache 05/23/2011  . Bipolar disorder in partial remission ([place] 05/23/2011  . Dysphagia 05/23/2011  . Esophageal reflux 05/23/2011  . Osteoarthrosis, unspecified whether generalized or localized, lower leg 05/23/2011   JKerin Perna PTA 04/08/16 1:16 PM  [facility] [address]Phone: [phone]  Fax:  [phone] Name: [name] ALERSMRN: [id number] of Birth: 909-05-1966

## 2016-04-11 ENCOUNTER — Encounter: Payer: Self-pay | Admitting: Rehabilitative and Restorative Service Providers"

## 2016-04-11 ENCOUNTER — Ambulatory Visit (INDEPENDENT_AMBULATORY_CARE_PROVIDER_SITE_OTHER): Payer: Medicare Other | Admitting: Rehabilitative and Restorative Service Providers"

## 2016-04-11 DIAGNOSIS — M25561 Pain in right knee: Secondary | ICD-10-CM | POA: Diagnosis not present

## 2016-04-11 DIAGNOSIS — R29898 Other symptoms and signs involving the musculoskeletal system: Secondary | ICD-10-CM

## 2016-04-11 DIAGNOSIS — G8929 Other chronic pain: Secondary | ICD-10-CM

## 2016-04-11 DIAGNOSIS — M25512 Pain in left shoulder: Secondary | ICD-10-CM

## 2016-04-11 NOTE — Patient Instructions (Signed)
Back Wall Slide    With feet __10-12__ inches from wall, lean as much of back against the wall as possible. Gently squat down _10__ inches, keeping back against wall. Hold __2-5__ seconds while counting out loud. Repeat _10___ times. Do __1-2__ sessions per day.   Resisted External Rotation: in Neutral - Bilateral   PALMS UP Sit or stand, tubing in both hands, elbows at sides, bent to 90, forearms forward. Pinch shoulder blades together and rotate forearms out. Keep elbows at sides. Repeat __10__ times per set. Do _2-3___ sets per session. Do _2-3___ sessions per day.   Low Row: Standing   Face anchor, feet shoulder width apart. Palms up, pull arms back, squeezing shoulder blades together. Repeat 10__ times per set. Do 2-3__ sets per session. Do 2-3__ sessions per week. Anchor Height: Waist  Strengthening: Resisted Extension   Hold tubing in right hand, arm forward. Pull arm back, elbow straight. Repeat _10___ times per set. Do 2-3____ sets per session. Do 2-3____ sessions per day.

## 2016-04-11 NOTE — Therapy (Signed)
[place], [place], [address] Phone: [phone]   Fax:  [phone]  Physical Therapy Treatment  Patient Details  Name: [name] MRN: [id number] Date of Birth: 1966-03-13 Referring Provider: Dr.Corey   Encounter Date: 04/11/2016      PT End of Session - 04/11/16 0927    Visit Number 8   Number of Visits 12   Date for PT Re-Evaluation 04/22/16   PT Start Time 0927   PT Stop Time 1024   PT Time Calculation (min) 57 min   Activity Tolerance Patient tolerated treatment well      Past Medical History:  Diagnosis Date  . Allergy   . Asthma    History of Asthma  . Bipolar disorder ([place])   . Chest pain, atypical 11/30/2012  . Depression   . H/O degenerative disc disease    L4-L5, L5-S1  . Hyperglycemia    Postoperative hyperglycemia  . Hypertension   . Obesity 07/14/2012  . OSA (obstructive sleep apnea)    mild  . Polycystic disease, ovaries   . Postoperative anemia   . Reflux   . Sinus tachycardia 07/14/2012    Past Surgical History:  Procedure Laterality Date  . NASAL SINUS SURGERY  2004  . OVARIAN CYST REMOVAL  1998   A cyst on the fallopian tube removed  . SPINAL FUSION    . TONSILLECTOMY  1996    There were no vitals filed for this visit.      Subjective Assessment - 04/11/16 0928    Subjective Improving - shoulder and neck are feeling better. Knee still feels "weak". Tape really helps for the knee. Feels therapy is helpful and would like to continue until she returns to MD the first of November.    Currently in Pain? No/denies                         [facility] Adult PT Treatment/Exercise - 04/11/16 0001      Knee/Hip Exercises: Aerobic   Nustep L5: 6 min   arms(9)/legs     Knee/Hip Exercises: Standing   Wall Squat 2 sets;5 reps  pause      Knee/Hip Exercises: Supine   Short Arc Quad Sets Strengthening;Right;2 sets;5 reps   Hip Adduction  Isometric Strengthening;Both;2 sets;5 reps   Straight Leg Raise with External Rotation Strengthening;Right;2 sets;5 reps  5 sec hold      Shoulder Exercises: Standing   Extension Strengthening;Both;Theraband   Theraband Level (Shoulder Extension) Level 2 (Red)   Row Strengthening;Both;20 reps;Theraband   Theraband Level (Shoulder Row) Level 2 (Red)   Retraction Strengthening;Both;20 reps   Theraband Level (Shoulder Retraction) Level 2 (Red)     Shoulder Exercises: Stretch   Other Shoulder Stretches Doorway stretch:  mid level, high level x 30 sec x 2 reps each - switched to unilateral for improved comfort.      Modalities   Modalities Moist Heat;Electrical Stimulation     Neck Exercises: Stretches   Upper Trapezius Stretch 3 reps;20 seconds                PT Education - 04/11/16 1004    Education provided Yes   Education Details HEP    Person(s) Educated Patient   Methods Explanation;Demonstration;Tactile cues;Verbal cues;Handout   Comprehension Verbalized understanding;Returned demonstration;Verbal cues required;Tactile cues required             PT Long Term Goals - 04/08/16  1004      PT LONG TERM GOAL #1   Title Improve posture and alignment with patient to demonstrate more upright posture and alignment 04/22/16   Time 6   Period Weeks   Status On-going     PT LONG TERM GOAL #2   Title Increased AROM Lt shoudler to equal or greater than Rt 04/22/16   Time 6   Period Weeks   Status Partially Met     PT LONG TERM GOAL #3   Title Improve functional level Lt shoulder allowing patient to use Lt UE for ADL's without pain or limitation 04/22/16   Time 6   Period Weeks   Status On-going     PT LONG TERM GOAL #4   Title Independent in HEP 04/22/16   Time 6   Period Weeks   Status On-going     PT LONG TERM GOAL #5   Title Improve FOTO to </= 52% limitaion 04/22/16   Time 6   Period Weeks   Status On-going     PT LONG TERM GOAL #6   Title Improve  patellar alignment on Rt allowing patient to progress with strengthening exercises for Rt LE 04/22/16   Time 6   Period Weeks   Status On-going               Plan - 04/11/16 0933    Clinical Impression Statement No pain today. Patient reports and demonstrates improved mobility and function. Worked to learn how to tape knee to provide stability for strengthening and function.    Rehab Potential Good   PT Frequency 2x / week   PT Duration 6 weeks   PT Treatment/Interventions Patient/family education;ADLs/Self Care Home Management;Neuromuscular re-education;Manual techniques;Dry needling;Cryotherapy;Electrical Stimulation;Iontophoresis 38m/ml Dexamethasone;Moist Heat;Ultrasound;Therapeutic activities;Therapeutic exercise   PT Next Visit Plan progress with exercises for Lt shoulder mobilty and strength; patellar alignment and LE stregthening. Continue taping for patellar alignment. Instruct pt in patellar taping    Consulted and Agree with Plan of Care Patient      Patient will benefit from skilled therapeutic intervention in order to improve the following deficits and impairments:  Postural dysfunction, Improper body mechanics, Pain, Decreased range of motion, Decreased mobility, Decreased strength, Increased fascial restricitons, Increased muscle spasms, Decreased activity tolerance  Visit Diagnosis: Left shoulder pain, unspecified chronicity  Right knee pain, unspecified chronicity  Other symptoms and signs involving the musculoskeletal system  Acute pain of left shoulder  Chronic pain of right knee     Problem List Patient Active Problem List   Diagnosis Date Noted  . Iron deficiency anemia 03/04/2016  . Dyslipidemia 03/04/2016  . Left shoulder pain 03/03/2016  . Right knee pain 03/03/2016  . Frozen shoulder 03/03/2016  . Pre-diabetes 11/01/2015  . Asthma with acute exacerbation 05/28/2015  . Acute sinusitis 05/28/2015  . Allergic rhinitis 05/28/2015  . Morbid  obesity with BMI of 40.0-44.9, adult ([place] 05/24/2013  . Ileitis 05/23/2013  . Chest pain, atypical 11/30/2012  . Sinus tachycardia 07/14/2012  . HTN (hypertension) 07/14/2012  . Polycystic ovary disease 07/14/2012  . Obstructive sleep apnea (adult) (pediatric) 02/04/2012  . Hot flashes 11/10/2011  . Disturbance in sleep behavior 09/16/2011  . Asthma 05/23/2011  . Backache 05/23/2011  . Bipolar disorder in partial remission ([place] 05/23/2011  . Dysphagia 05/23/2011  . Esophageal reflux 05/23/2011  . Osteoarthrosis, unspecified whether generalized or localized, lower leg 05/23/2011    Erilyn Pearman PNilda SimmerPT, MPH  04/11/2016, 10:11 AM  [place], [place], [address] Phone: [phone]   Fax:  [phone]  Name: [name] MRN: [id number] Date of Birth: 1966/06/11

## 2016-04-15 ENCOUNTER — Ambulatory Visit (INDEPENDENT_AMBULATORY_CARE_PROVIDER_SITE_OTHER): Payer: Medicare Other | Admitting: Physical Therapy

## 2016-04-15 DIAGNOSIS — M25512 Pain in left shoulder: Secondary | ICD-10-CM | POA: Diagnosis present

## 2016-04-15 DIAGNOSIS — R29898 Other symptoms and signs involving the musculoskeletal system: Secondary | ICD-10-CM | POA: Diagnosis not present

## 2016-04-15 DIAGNOSIS — M25561 Pain in right knee: Secondary | ICD-10-CM | POA: Diagnosis not present

## 2016-04-15 NOTE — Therapy (Signed)
[place], [place], [address] Phone: [phone]   Fax:  [phone]  Physical Therapy Treatment  Patient Details  Name: [name] MRN: [id number] Date of Birth: 09-08-1965 Referring Provider: Dr. Georgina Snell   Encounter Date: 04/15/2016      PT End of Session - 04/15/16 1150    Visit Number 9   Number of Visits 12   Date for PT Re-Evaluation 04/22/16   PT Start Time 1105   PT Stop Time 1203   PT Time Calculation (min) 58 min      Past Medical History:  Diagnosis Date  . Allergy   . Asthma    History of Asthma  . Bipolar disorder ([place])   . Chest pain, atypical 11/30/2012  . Depression   . H/O degenerative disc disease    L4-L5, L5-S1  . Hyperglycemia    Postoperative hyperglycemia  . Hypertension   . Obesity 07/14/2012  . OSA (obstructive sleep apnea)    mild  . Polycystic disease, ovaries   . Postoperative anemia   . Reflux   . Sinus tachycardia 07/14/2012    Past Surgical History:  Procedure Laterality Date  . NASAL SINUS SURGERY  2004  . OVARIAN CYST REMOVAL  1998   A cyst on the fallopian tube removed  . SPINAL FUSION    . TONSILLECTOMY  1996    There were no vitals filed for this visit.      Subjective Assessment - 04/15/16 1118    Subjective Pt reports she is stiff every where.  She feels the change in weather has affected all of her joints.     Currently in Pain? No/denies            [facility] PT Assessment - 04/15/16 0001      Assessment   Medical Diagnosis Lt shoulder pain; frozen shoulder; Rt knee pain    Referring Provider Dr. Georgina Snell    Onset Date/Surgical Date 12/06/15   Hand Dominance Right   Next MD Visit 11/17     Flexibility   Hamstrings Rt 68 deg, Lt 45 deg           [facility] Adult PT Treatment/Exercise - 04/15/16 0001      Knee/Hip Exercises: Stretches   Passive Hamstring Stretch Right;Left;3 reps;30 seconds   Passive Hamstring Stretch  Limitations Increased pain in LLE    Quad Stretch Left;Right;2 reps;30 seconds   ITB Stretch Left;1 rep;20 seconds     Knee/Hip Exercises: Aerobic   Nustep L4: 6 min   arms(9)/legs     Knee/Hip Exercises: Standing   Wall Squat 2 sets;5 reps  pause      Knee/Hip Exercises: Sidelying   Clams 10 reps each side, 2 sets      Shoulder Exercises: Standing   Extension Strengthening;Both;Theraband   Theraband Level (Shoulder Extension) Level 2 (Red)   Row Strengthening;Both;20 reps;Theraband   Theraband Level (Shoulder Row) Level 2 (Red)     Shoulder Exercises: Stretch   Other Shoulder Stretches Doorway stretch:  mid level, high level x 30 sec x 3 reps each -  unilateral for improved comfort.      Modalities   Modalities Electrical Stimulation;Moist Heat     Moist Heat Therapy   Number Minutes Moist Heat 15 Minutes   Moist Heat Location Lumbar Spine     Electrical Stimulation   Electrical Stimulation Location lumbar paraspinals    Electrical Stimulation Action IFC   Electrical Stimulation  Parameters  to tolerance    Electrical Stimulation Goals Pain;Tone     Manual Therapy   Kinesiotex Inhibit Muscle     Kinesiotix   Inhibit Muscle  X strip applied to Rt lateral quad and forming c shape around patella.  I strip pulled medially to assist with patellar tracking.             PT Long Term Goals - 04/08/16 1004      PT LONG TERM GOAL #1   Title Improve posture and alignment with patient to demonstrate more upright posture and alignment 04/22/16   Time 6   Period Weeks   Status On-going     PT LONG TERM GOAL #2   Title Increased AROM Lt shoudler to equal or greater than Rt 04/22/16   Time 6   Period Weeks   Status Partially Met     PT LONG TERM GOAL #3   Title Improve functional level Lt shoulder allowing patient to use Lt UE for ADL's without pain or limitation 04/22/16   Time 6   Period Weeks   Status On-going     PT LONG TERM GOAL #4   Title Independent in HEP  04/22/16   Time 6   Period Weeks   Status On-going     PT LONG TERM GOAL #5   Title Improve FOTO to </= 52% limitaion 04/22/16   Time 6   Period Weeks   Status On-going     PT LONG TERM GOAL #6   Title Improve patellar alignment on Rt allowing patient to progress with strengthening exercises for Rt LE 04/22/16   Time 6   Period Weeks   Status On-going               Plan - 04/15/16 1302    Clinical Impression Statement Pt had some difficulty tolerating exercises, especially LE stretches due to increased pain during.  Pt re-taped for her Rt knee; continues to have positive response (decreased pain, increased proprioception).    No new goals met due to weather related flare.    Rehab Potential Good   PT Frequency 2x / week   PT Duration 6 weeks   PT Treatment/Interventions Patient/family education;ADLs/Self Care Home Management;Neuromuscular re-education;Manual techniques;Dry needling;Cryotherapy;Electrical Stimulation;Iontophoresis 44m/ml Dexamethasone;Moist Heat;Ultrasound;Therapeutic activities;Therapeutic exercise   PT Next Visit Plan progress with exercises for Lt shoulder mobilty and strength; patellar alignment and LE stregthening. Continue taping for patellar alignment.    Consulted and Agree with Plan of Care Patient      Patient will benefit from skilled therapeutic intervention in order to improve the following deficits and impairments:  Postural dysfunction, Improper body mechanics, Pain, Decreased range of motion, Decreased mobility, Decreased strength, Increased fascial restricitons, Increased muscle spasms, Decreased activity tolerance  Visit Diagnosis: Left shoulder pain, unspecified chronicity  Right knee pain, unspecified chronicity  Other symptoms and signs involving the musculoskeletal system     Problem List Patient Active Problem List   Diagnosis Date Noted  . Iron deficiency anemia 03/04/2016  . Dyslipidemia 03/04/2016  . Left shoulder pain  03/03/2016  . Right knee pain 03/03/2016  . Frozen shoulder 03/03/2016  . Pre-diabetes 11/01/2015  . Asthma with acute exacerbation 05/28/2015  . Acute sinusitis 05/28/2015  . Allergic rhinitis 05/28/2015  . Morbid obesity with BMI of 40.0-44.9, adult ([place] 05/24/2013  . Ileitis 05/23/2013  . Chest pain, atypical 11/30/2012  . Sinus tachycardia 07/14/2012  . HTN (hypertension) 07/14/2012  . Polycystic ovary disease 07/14/2012  .  Obstructive sleep apnea (adult) (pediatric) 02/04/2012  . Hot flashes 11/10/2011  . Disturbance in sleep behavior 09/16/2011  . Asthma 05/23/2011  . Backache 05/23/2011  . Bipolar disorder in partial remission ([place]) 05/23/2011  . Dysphagia 05/23/2011  . Esophageal reflux 05/23/2011  . Osteoarthrosis, unspecified whether generalized or localized, lower leg 05/23/2011   Kerin Perna, PTA 04/15/16 1:07 PM  [place], [place], [address] Phone: [phone]   Fax:  [phone]  Name: [name] MRN: [id number] Date of Birth: 1965-09-05

## 2016-04-17 ENCOUNTER — Ambulatory Visit (INDEPENDENT_AMBULATORY_CARE_PROVIDER_SITE_OTHER): Payer: Medicare Other | Admitting: Physical Therapy

## 2016-04-17 DIAGNOSIS — R29898 Other symptoms and signs involving the musculoskeletal system: Secondary | ICD-10-CM

## 2016-04-17 DIAGNOSIS — M25561 Pain in right knee: Secondary | ICD-10-CM | POA: Diagnosis present

## 2016-04-17 NOTE — Therapy (Addendum)
[place], [place], [address] Phone: [phone]   Fax:  [phone]  Physical Therapy Treatment  Patient Details  Name: [name] MRN: [id number] Date of Birth: [birth year] Referring Provider: Dr. Georgina Snell   Encounter Date: 04/17/2016      PT End of Session - 04/17/16 1440    Visit Number 10   Number of Visits 12   Date for PT Re-Evaluation 04/22/16   PT Start Time 1505   PT Stop Time 1518   PT Time Calculation (min) 74 min      Past Medical History:  Diagnosis Date  . Allergy   . Asthma    History of Asthma  . Bipolar disorder ([place])   . Chest pain, atypical 11/30/2012  . Depression   . H/O degenerative disc disease    L4-L5, L5-S1  . Hyperglycemia    Postoperative hyperglycemia  . Hypertension   . Obesity 07/14/2012  . OSA (obstructive sleep apnea)    mild  . Polycystic disease, ovaries   . Postoperative anemia   . Reflux   . Sinus tachycardia 07/14/2012    Past Surgical History:  Procedure Laterality Date  . NASAL SINUS SURGERY  2004  . OVARIAN CYST REMOVAL  1998   A cyst on the fallopian tube removed  . SPINAL FUSION    . TONSILLECTOMY  1996    There were no vitals filed for this visit.      Subjective Assessment - 04/17/16 1416    Subjective Pt reports her Lt shoulder is no longer painful or bothersome.  Low back has been more painful with rain pattern coming in.   Pt is tearful as her has been hospitalized.    Currently in Pain? Yes   Pain Score 5   up to 7/10 with bending    Pain Location Back   Pain Orientation Right;Left;Lower   Aggravating Factors  bending    Pain Relieving Factors ice, TENS             [facility] PT Assessment - 04/17/16 0001      Assessment   Medical Diagnosis Lt shoulder pain; frozen shoulder; Rt knee pain    Referring Provider Dr. Georgina Snell    Onset Date/Surgical Date 12/06/15   Hand Dominance Right   Next MD Visit 11/17     Observation/Other Assessments   Focus on Therapeutic Outcomes (FOTO)  55% limited          [facility] Adult PT Treatment/Exercise - 04/17/16 0001      Knee/Hip Exercises: Aerobic   Nustep L4: 7 min   arms(9)/legs     Knee/Hip Exercises: Supine   Straight Leg Raises Limitations 1 set 5 reps with LLE, caused LBP to increase - stopped    Straight Leg Raise with External Rotation Strengthening;Right;3 sets;5 reps   Other Supine Knee/Hip Exercises Leg lengthener with arm reach x 5 sec hold x 5 reps, then leg press x 5 sec hold x 5 reps      Knee/Hip Exercises: Sidelying   Clams 10 reps each side, 2 sets      Shoulder Exercises: Supine   Flexion AROM;Both  stretch overhead x 3 reps      Shoulder Exercises: Stretch   Other Shoulder Stretches Doorway stretch:  mid level, high level x 30 sec x 3 reps each -  unilateral for improved comfort.      Modalities   Modalities Electrical Stimulation;Moist Heat;Cryotherapy  Moist Heat Therapy   Number Minutes Moist Heat 20 Minutes   Moist Heat Location Knee     Cryotherapy   Number Minutes Cryotherapy 20 Minutes   Cryotherapy Location Lumbar Spine   Type of Cryotherapy Ice pack     Electrical Stimulation   Electrical Stimulation Location lumbar paraspinals    Electrical Stimulation Action IFC   Electrical Stimulation Parameters to tolerance    Electrical Stimulation Goals Pain     Neck Exercises: Stretches   Upper Trapezius Stretch 3 reps;20 seconds                     PT Long Term Goals - 04/17/16 1422      PT LONG TERM GOAL #1   Title Improve posture and alignment with patient to demonstrate more upright posture and alignment 04/22/16   Time 6   Period Weeks   Status On-going     PT LONG TERM GOAL #2   Title Increased AROM Lt shoudler to equal or greater than Rt 04/22/16   Time 6   Period Weeks   Status Achieved     PT LONG TERM GOAL #3   Title Improve functional level Lt shoulder allowing patient to use Lt  UE for ADL's without pain or limitation 04/22/16   Time 6   Period Weeks   Status Achieved     PT LONG TERM GOAL #4   Title Independent in HEP 04/22/16   Time 6   Period Weeks   Status On-going     PT LONG TERM GOAL #5   Title Improve FOTO to </= 52% limitaion 04/22/16   Time 6   Period Weeks   Status On-going  scored 55% limited     PT LONG TERM GOAL #6   Title Improve patellar alignment on Rt allowing patient to progress with strengthening exercises for Rt LE 04/22/16   Time 6   Period Weeks   Status On-going               Plan - 04/17/16 1718    Clinical Impression Statement Pt tolerated gentle supine exercises for postural strengthening, with only minor irritation in knees with flexion between reps.  Pt reported decreased knee pain with MHP application at end and decreased LBP with ice/ estim. Pt's FOTO score improved from intake.  Pt has met LTG 2, 3 and making gradual gains towards unmet goals.    Rehab Potential Good   PT Frequency 2x / week   PT Treatment/Interventions Patient/family education;ADLs/Self Care Home Management;Neuromuscular re-education;Manual techniques;Dry needling;Cryotherapy;Electrical Stimulation;Iontophoresis 40m/ml Dexamethasone;Moist Heat;Ultrasound;Therapeutic activities;Therapeutic exercise   PT Next Visit Plan progress with exercises for Lt shoulder mobilty and strength; patellar alignment and LE stregthening. Continue taping for patellar alignment.    Consulted and Agree with Plan of Care Patient      Patient will benefit from skilled therapeutic intervention in order to improve the following deficits and impairments:  Postural dysfunction, Improper body mechanics, Pain, Decreased range of motion, Decreased mobility, Decreased strength, Increased fascial restricitons, Increased muscle spasms, Decreased activity tolerance  Visit Diagnosis: Right knee pain, unspecified chronicity  Other symptoms and signs involving the musculoskeletal  system     Problem List Patient Active Problem List   Diagnosis Date Noted  . Iron deficiency anemia 03/04/2016  . Dyslipidemia 03/04/2016  . Left shoulder pain 03/03/2016  . Right knee pain 03/03/2016  . Frozen shoulder 03/03/2016  . Pre-diabetes 11/01/2015  . Asthma with acute exacerbation 05/28/2015  .  Acute sinusitis 05/28/2015  . Allergic rhinitis 05/28/2015  . Morbid obesity with BMI of 40.0-44.9, adult ([place]) 05/24/2013  . Ileitis 05/23/2013  . Chest pain, atypical 11/30/2012  . Sinus tachycardia 07/14/2012  . HTN (hypertension) 07/14/2012  . Polycystic ovary disease 07/14/2012  . Obstructive sleep apnea (adult) (pediatric) 02/04/2012  . Hot flashes 11/10/2011  . Disturbance in sleep behavior 09/16/2011  . Asthma 05/23/2011  . Backache 05/23/2011  . Bipolar disorder in partial remission ([place]) 05/23/2011  . Dysphagia 05/23/2011  . Esophageal reflux 05/23/2011  . Osteoarthrosis, unspecified whether generalized or localized, lower leg 05/23/2011   Kerin Perna, PTA 04/17/16 5:24 PM  [place], [place], [address] Phone: [phone]   Fax:  [phone]  Name: HAYDON KALMAR MRN: [id number] Date of Birth: Aug 27, 1965   PHYSICAL THERAPY DISCHARGE SUMMARY  Visits from Start of Care: 10  Current functional level related to goals / functional outcomes: See last PT progress note for discharge status   Remaining deficits: Continues to have chronic pain and functional limitations.   Education / Equipment: HEP Plan: Patient agrees to discharge.  Patient goals were partially met. Patient is being discharged due to not returning since the last visit.  ?????    Celyn P. Helene Kelp PT, MPH 06/03/16 12:18 PM

## 2016-04-17 NOTE — Patient Instructions (Addendum)
Leg Lengthener: Full    Straighten one leg. Pull toes AND forefoot toward knee, extend heel. Lengthen leg by pulling pelvis away from ribs. Hold __5_ seconds. Relax. Repeat 1 time. Re-bend knee. Do other leg. Each leg _5-10__ times.  Surface: floor Arm Lengthener: Single    Arms at sides, palms down. Lift one arm over head to alongside ear, keeping elbow straight. Lengthen arm by pulling ribs away from pelvis. Hold __5_ seconds. Relax. Repeat 5 time. Return arm to side. Repeat with other arm. (can do both arms at the same time) Leg Lengthener / Leg Press Combo: Single Leg    Straighten one leg down to floor. Pull toes AND forefoot toward knee; extend heel. Lengthen leg by pulling pelvis away from ribs. Press leg down. DO NOT BEND KNEE. Hold __5_ seconds. Relax leg. Repeat exercise 5-10 time. Relax leg. Re-bend knee. Repeat with other leg.  Thoracic Lift    Press shoulders down. Then lift mid-thoracic spine (area between the shoulder blades). Lift the breastbone slightly. Hold __5_ seconds. Relax. Repeat __10_ times.   [facility] at [facility] [place], [place] [address]  [phone] (office) [phone] (fax)

## 2016-04-22 ENCOUNTER — Encounter: Payer: Medicare Other | Admitting: Physical Therapy

## 2016-04-24 ENCOUNTER — Encounter: Payer: Medicare Other | Admitting: Physical Therapy

## 2016-05-13 ENCOUNTER — Ambulatory Visit (INDEPENDENT_AMBULATORY_CARE_PROVIDER_SITE_OTHER): Payer: Medicare Other | Admitting: Family Medicine

## 2016-05-13 ENCOUNTER — Encounter: Payer: Self-pay | Admitting: Family Medicine

## 2016-05-13 VITALS — BP 113/78 | HR 111 | Wt 251.0 lb

## 2016-05-13 DIAGNOSIS — N951 Menopausal and female climacteric states: Secondary | ICD-10-CM

## 2016-05-13 DIAGNOSIS — M1711 Unilateral primary osteoarthritis, right knee: Secondary | ICD-10-CM | POA: Insufficient documentation

## 2016-05-13 DIAGNOSIS — G8929 Other chronic pain: Secondary | ICD-10-CM | POA: Diagnosis not present

## 2016-05-13 DIAGNOSIS — M7502 Adhesive capsulitis of left shoulder: Secondary | ICD-10-CM

## 2016-05-13 DIAGNOSIS — M25561 Pain in right knee: Secondary | ICD-10-CM | POA: Diagnosis not present

## 2016-05-13 NOTE — Patient Instructions (Signed)
Thank you for coming in today. We will contact you about the gel shots.  I will talk with your pain doctors via a letter.  Return in 1-2 months if we are not seeing you sooner.

## 2016-05-13 NOTE — Progress Notes (Signed)
[name] is a 50 y.o. female who presents to [facility]: Primary Care Sports Medicine today for   Follow-up left shoulder adhesive capsulitis, hot flashes and discuss right knee pain.  Left shoulder adhesive capsulitis: Much improved following injection and physical therapy. Patient is essentially symptom-free.  Hot flashes: In the interim patient has been seen by OB/GYN who recommends watchful waiting as the diagnosis is perimenopausal symptoms.  Right knee pain: Patient has right knee pain following injuries and arthritis. In the past she's been plagued by patella subluxation at some point in the past had what sounds like a lateral release. She notes additionally she suffered a knee fracture several years ago. 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 She's had several steroid injections which have not worked at all. In the past she has had trials of Hyaluronic acid which worked well. She is currently continuing pain management by Dr. Greta Doom. The patient notes that she currently is in the process of getting a repeat series of Hyaluronic acid injections for her right knee arranged. The patient notes that she would prefer to have these injections performed at this office as is more convenient for her. Patient has bothersome right knee pain that interferes with sleep and activities. She takes pain medications listed below for the right knee pain as well as her other chronic pain generators. Symptoms are moderately controlled.   Past Medical History:  Diagnosis Date  . Allergy   . Asthma    History of Asthma  . Bipolar disorder ([place])   . Chest pain, atypical 11/30/2012  . Depression   . H/O degenerative disc disease    L4-L5, L5-S1  . Hyperglycemia    Postoperative hyperglycemia  . Hypertension   . Obesity 07/14/2012  . OSA (obstructive sleep apnea)    mild  . Polycystic disease, ovaries     . Postoperative anemia   . Reflux   . Sinus tachycardia 07/14/2012   Past Surgical History:  Procedure Laterality Date  . NASAL SINUS SURGERY  2004  . OVARIAN CYST REMOVAL  1998   A cyst on the fallopian tube removed  . SPINAL FUSION    . TONSILLECTOMY  1996   Social History  Substance Use Topics  . Smoking status: Never Smoker  . Smokeless tobacco: Never Used  . Alcohol use No   family history includes Cancer in her father, maternal grandmother, and paternal grandmother.  ROS as above:  Medications: Current Outpatient Prescriptions  Medication Sig Dispense Refill  . albuterol (PROAIR HFA) 108 (90 BASE) MCG/ACT inhaler Inhale 2 puffs into the lungs every 4 (four) hours as needed for wheezing or shortness of breath.    Marland Kitchen albuterol (PROVENTIL) (2.5 MG/3ML) 0.083% nebulizer solution Take 3 mLs (2.5 mg total) by nebulization every 4 (four) hours as needed for wheezing or shortness of breath. 75 mL 1  . ALPRAZolam (XANAX) 1 MG tablet Take 1 mg by mouth.    . AMITIZA 24 MCG capsule     . bisoprolol (ZEBETA) 5 MG tablet TAKE 1 TABLET (5 MG TOTAL) BY MOUTH DAILY. 90 tablet 0  . buPROPion (WELLBUTRIN XL) 150 MG 24 hr tablet     . buPROPion (WELLBUTRIN XL) 300 MG 24 hr tablet     . clonazePAM (KLONOPIN) 1 MG tablet Take 2 mg by mouth at bedtime.  5  . cyclobenzaprine (FLEXERIL) 10 MG tablet     . escitalopram (LEXAPRO) 10 MG tablet     .  fentaNYL (DURAGESIC - DOSED MCG/HR) 50 MCG/HR     . fluticasone (FLONASE) 50 MCG/ACT nasal spray Place 1 spray into the nose daily.    . hydrochlorothiazide (MICROZIDE) 12.5 MG capsule TAKE 1 TABLET (12.5 MG TOTAL) BY MOUTH DAILY.  3  . HYDROmorphone (DILAUDID) 2 MG tablet Take 2 mg by mouth every 6 (six) hours as needed for severe pain.    Marland Kitchen levonorgestrel-ethinyl estradiol (SEASONALE,INTROVALE,JOLESSA) 0.15-0.03 MG tablet TAKE 1 TABLET BY MOUTH DAILY.    . metFORMIN (GLUCOPHAGE) 1000 MG tablet Take 1,000 mg by mouth 2 (two) times daily with a meal.     . mometasone-formoterol (DULERA) 200-5 MCG/ACT AERO Inhale 2 puffs into the lungs 2 (two) times daily. 1 Inhaler 5  . nortriptyline (PAMELOR) 75 MG capsule Take 75 mg by mouth.    Marland Kitchen omeprazole (PRILOSEC) 20 MG capsule Take 20 mg by mouth.    . perphenazine (TRILAFON) 4 MG tablet     . temazepam (RESTORIL) 30 MG capsule Take 30 mg by mouth at bedtime as needed for sleep.    . traZODone (DESYREL) 100 MG tablet Take 300 mg by mouth.     Current Facility-Administered Medications  Medication Dose Route Frequency Provider Last Rate Last Dose  . methylPREDNISolone acetate (DEPO-MEDROL) injection 80 mg  80 mg Intramuscular Once Adelina Mings, MD      . predniSONE (DELTASONE) tablet 10 mg  10 mg Oral UD Adelina Mings, MD       Allergies  Allergen Reactions  . Gabapentin Nausea And Vomiting and Anaphylaxis  . Pineapple Shortness Of Breath  . Shellfish Allergy Anaphylaxis  . Shellfish-Derived Products Anaphylaxis  . Erythromycin Swelling  . Latex Rash    Hives  . Pregabalin Swelling  . Pregabalin Swelling  . Duloxetine Itching and Other (See Comments)    Unknown reaction Unknown reaction  . Erythromycin Base Swelling  . Neuromuscular Blocking Agents   . Pollen Extract Other (See Comments)    Stuffy  Nose    Health Maintenance Health Maintenance  Topic Date Due  . HIV Screening  03/21/1982  . PAP SMEAR  03/27/2019  . TETANUS/TDAP  06/19/2019  . INFLUENZA VACCINE  Completed     Exam:  BP 113/78   Pulse (!) 111   Wt 251 lb (113.9 kg)   BMI 39.31 kg/m  Gen: Well NAD HEENT: EOMI,  MMM Lungs: Normal work of breathing. CTABL Heart: RRR no MRG heart rate normal per my check Abd: NABS, Soft. Nondistended, Nontender Exts: Brisk capillary refill, warm and well perfused.  Right knee: Normal-appearing. Range of motion 0-100 with significant retropatellar crepitations. Stable ligamentous exam. Intact extension strength. Left shoulder normal-appearing nontender normal  motion  No results found for this or any previous visit (from the past 72 hour(s)). No results found.    Assessment and Plan: 50 y.o. female with  Right knee pain: Primary issue today. This is DJD. Per patient request will prior authorize hyaluronic acid injections to the right knee. We'll communicate this with her pain management provider Dr. Greta Doom. This plan conflicts with current management with pain management we will defer. 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 We discussed that is based approach including weight loss and quad strengthening. Otherwise she likely will benefit from a total knee replacement at some point in the future.  Left shoulder adhesive capsulitis improved. Continue current management.  Hot flashes: Perimenopause. Continue current management.  Recheck in about 2 months or sooner if needed. Will  return sooner for hyaluronic acid injections.  No orders of the defined types were placed in this encounter.   Discussed warning signs or symptoms. Please see discharge instructions. Patient expresses understanding.  CC: Dr Greta Doom:  Phone: [phone]; Fax: [phone]

## 2016-05-13 NOTE — Progress Notes (Signed)
Submitted for approval on [employer]. Awaiting confirmation.  

## 2016-05-14 NOTE — Progress Notes (Signed)
Note sent to Dr. Greta Doom via epic.

## 2016-05-15 NOTE — Progress Notes (Signed)
Notes sent to Dr. Greta Doom via epic.

## 2016-06-17 ENCOUNTER — Ambulatory Visit: Payer: Medicare Other | Admitting: Family Medicine

## 2016-07-08 ENCOUNTER — Encounter: Payer: Self-pay | Admitting: Family Medicine

## 2016-07-08 ENCOUNTER — Ambulatory Visit (INDEPENDENT_AMBULATORY_CARE_PROVIDER_SITE_OTHER): Payer: Medicare Other

## 2016-07-08 ENCOUNTER — Other Ambulatory Visit: Payer: Self-pay | Admitting: Allergy

## 2016-07-08 ENCOUNTER — Ambulatory Visit (INDEPENDENT_AMBULATORY_CARE_PROVIDER_SITE_OTHER): Payer: Medicare Other | Admitting: Family Medicine

## 2016-07-08 VITALS — BP 128/84 | HR 100 | Wt 250.0 lb

## 2016-07-08 DIAGNOSIS — Z6841 Body Mass Index (BMI) 40.0 and over, adult: Secondary | ICD-10-CM | POA: Diagnosis not present

## 2016-07-08 DIAGNOSIS — K219 Gastro-esophageal reflux disease without esophagitis: Secondary | ICD-10-CM | POA: Diagnosis not present

## 2016-07-08 DIAGNOSIS — M79675 Pain in left toe(s): Secondary | ICD-10-CM

## 2016-07-08 DIAGNOSIS — I10 Essential (primary) hypertension: Secondary | ICD-10-CM

## 2016-07-08 DIAGNOSIS — M79676 Pain in unspecified toe(s): Secondary | ICD-10-CM | POA: Insufficient documentation

## 2016-07-08 MED ORDER — HYDROCHLOROTHIAZIDE 12.5 MG PO CAPS: 12.5000 mg | ORAL_CAPSULE | Freq: Every day | ORAL | 3 refills | Status: DC

## 2016-07-08 MED ORDER — OMEPRAZOLE 20 MG PO CPDR: 20.0000 mg | DELAYED_RELEASE_CAPSULE | Freq: Every day | ORAL | 3 refills | Status: DC

## 2016-07-08 NOTE — Patient Instructions (Addendum)
Thank you for coming in today. Get an xray on the way out today.  Buy an over the counter or online Turf toe plate or metatarsal plate.  It should be carbon fiber and full length.  You should hear from Bariatric Surgery folks soon.  Please let me know if you do not hear anything.    Hallux Rigidus Introduction Hallux rigidus is a type of joint pain or joint disease (arthritis) that affects your big toe (hallux). This condition involves the joint that connects the base of your big toe to the main part of your foot (metatarsophalangeal joint). This condition can cause your big toe to become stiff, painful, and difficult to move. Symptoms may get worse with movement or in cold or damp weather. The condition also gets worse over time. What are the causes? This condition may be caused by having a foot that does not function the way that it should or has an abnormal shape (structural deformity). These foot problems can run in families (be hereditary). This condition can also be caused by:  Injury.  Overuse.  Certain inflammatory diseases, including gout and rheumatoid arthritis. What increases the risk? This condition is more likely to develop in people who:  Have a foot bone (metatarsal) that is longer or higher than normal.  Have a family history of hallux rigidus.  Have previously injured their big toe.  Have feet that do not have a curve (arch) on the inner side of the foot. This may be called flat feet or fallen arches.  Turn their ankles in when they walk (pronation).  Have rheumatoid arthritis or gout.  Have to stoop down often at work. What are the signs or symptoms? Symptoms of this condition include:  Big toe pain.  Stiffness and difficulty moving the big toe.  Swelling of the toe and surrounding area.  Bone spurs. These are bony growths that can form on the joint of the big toe.  A limp. How is this diagnosed? This condition is diagnosed based on a medical  history and physical exam. This may include X-rays. How is this treated? Treatment for this condition includes:  Wearing roomy, comfortable shoes that have a large toe box.  Putting orthotic devices in your shoes.  Pain medicines.  Physical therapy.  Icing the injured area.  Alternate between putting your foot in cold water then warm water. If your condition is severe, treatment may include:  Corticosteroid injections to relieve pain.  Surgery to remove bone spurs, fuse damaged bones together, or replace the entire joint. Follow these instructions at home:  Take over-the-counter and prescription medicines only as told by your health care provider.  Do not wear high heels or other restrictive footwear. Wear comfortable, supportive shoes that have a large toe box.  Wear orthotics as told by your health care provider, if this applies.  Put your feet in cold water for 30 seconds, then in warm water for 30 seconds. Alternate between the cold and warm water for 5 minutes. Do this several times a day or as told by your health care provider.  If directed, apply ice to the injured area.  Put ice in a plastic bag.  Place a towel between your skin and the bag.  Leave the ice on for 20 minutes, 2-3 times per day.  Do foot exercises as instructed by your health care provider or a physical therapist.  Keep all follow-up visits as told by your health care provider. This is important. Contact a  health care provider if:  You notice bone spurs or growths on or around your big toe.  Your pain does not get better or it gets worse.  You have pain while resting.  You have pain in other parts of your body, such as your back, hip, or knee.  You start to limp. This information is not intended to replace advice given to you by your health care provider. Make sure you discuss any questions you have with your health care provider. Document Released: 06/23/2005 Document Revised: 11/29/2015  Document Reviewed: 02/28/2015  2017 Elsevier

## 2016-07-08 NOTE — Progress Notes (Signed)
[name] is a 51 y.o. female who presents to [facility]: [facility] today for left toe pain  and GERD.  Left toe pain: Patient notes a several month history of left toe pain. She was told several years ago that she may have broken it on a unrelated x-ray. At that time however the toe is not painful at all. Several months ago the pain started in her left great toe MTP joint. She has pain with walking and push off. She denies any weakness or numbness or recent injury.  GERD: Patient needs a refill of omeprazole. She notes this helps control her acid reflux symptoms. She feels well with no chest pain palpitations shortness of breath.  Hypertension: Doing well with hydrochlorothiazide. Patient with a refill today if possible.  Morbid obesity: Patient has had great difficulty losing weight over the years. She's interested in gastric bypass. Her complicating factors include chronic back and knee pain as well as acid reflux and hypertension.  Past Medical History:  Diagnosis Date  . Allergy   . Asthma    History of Asthma  . Bipolar disorder ([place])   . Chest pain, atypical 11/30/2012  . Depression   . H/O degenerative disc disease    L4-L5, L5-S1  . Hyperglycemia    Postoperative hyperglycemia  . Hypertension   . Obesity 07/14/2012  . OSA (obstructive sleep apnea)    mild  . Polycystic disease, ovaries   . Postoperative anemia   . Reflux   . Sinus tachycardia 07/14/2012   Past Surgical History:  Procedure Laterality Date  . NASAL SINUS SURGERY  2004  . OVARIAN CYST REMOVAL  1998   A cyst on the fallopian tube removed  . SPINAL FUSION    . TONSILLECTOMY  1996   Social History  Substance Use Topics  . Smoking status: Never Smoker  . Smokeless tobacco: Never Used  . Alcohol use No   family history includes Cancer in her father, maternal grandmother, and paternal  grandmother.  ROS as above:  Medications: Current Outpatient Prescriptions  Medication Sig Dispense Refill  . albuterol (PROAIR HFA) 108 (90 BASE) MCG/ACT inhaler Inhale 2 puffs into the lungs every 4 (four) hours as needed for wheezing or shortness of breath.    Marland Kitchen albuterol (PROVENTIL) (2.5 MG/3ML) 0.083% nebulizer solution Take 3 mLs (2.5 mg total) by nebulization every 4 (four) hours as needed for wheezing or shortness of breath. 75 mL 1  . ALPRAZolam (XANAX) 1 MG tablet Take 1 mg by mouth.    . AMITIZA 24 MCG capsule     . bisoprolol (ZEBETA) 5 MG tablet TAKE 1 TABLET (5 MG TOTAL) BY MOUTH DAILY. 90 tablet 0  . buPROPion (WELLBUTRIN XL) 150 MG 24 hr tablet     . buPROPion (WELLBUTRIN XL) 300 MG 24 hr tablet     . clonazePAM (KLONOPIN) 1 MG tablet Take 2 mg by mouth at bedtime.  5  . cyclobenzaprine (FLEXERIL) 10 MG tablet     . escitalopram (LEXAPRO) 10 MG tablet     . fentaNYL (DURAGESIC - DOSED MCG/HR) 50 MCG/HR     . fluticasone (FLONASE) 50 MCG/ACT nasal spray Place 1 spray into the nose daily.    . hydrochlorothiazide (MICROZIDE) 12.5 MG capsule Take 1 capsule (12.5 mg total) by mouth daily. 90 capsule 3  . HYDROmorphone (DILAUDID) 2 MG tablet Take 2 mg by mouth every 6 (six) hours as needed  for severe pain.    Marland Kitchen levonorgestrel-ethinyl estradiol (SEASONALE,INTROVALE,JOLESSA) 0.15-0.03 MG tablet TAKE 1 TABLET BY MOUTH DAILY.    . metFORMIN (GLUCOPHAGE) 1000 MG tablet Take 1,000 mg by mouth 2 (two) times daily with a meal.    . mometasone-formoterol (DULERA) 200-5 MCG/ACT AERO Inhale 2 puffs into the lungs 2 (two) times daily. 1 Inhaler 5  . nortriptyline (PAMELOR) 75 MG capsule Take 75 mg by mouth.    Marland Kitchen omeprazole (PRILOSEC) 20 MG capsule Take 1 capsule (20 mg total) by mouth daily. 90 capsule 3  . perphenazine (TRILAFON) 4 MG tablet     . temazepam (RESTORIL) 30 MG capsule Take 30 mg by mouth at bedtime as needed for sleep.    . traZODone (DESYREL) 100 MG tablet Take 300 mg by  mouth.     Current Facility-Administered Medications  Medication Dose Route Frequency Provider Last Rate Last Dose  . methylPREDNISolone acetate (DEPO-MEDROL) injection 80 mg  80 mg Intramuscular Once Adelina Mings, MD       Allergies  Allergen Reactions  . Gabapentin Nausea And Vomiting and Anaphylaxis  . Pineapple Shortness Of Breath  . Shellfish Allergy Anaphylaxis  . Shellfish-Derived Products Anaphylaxis  . Erythromycin Swelling  . Latex Rash    Hives  . Pregabalin Swelling  . Pregabalin Swelling  . Duloxetine Itching and Other (See Comments)    Unknown reaction Unknown reaction  . Erythromycin Base Swelling  . Neuromuscular Blocking Agents   . Pollen Extract Other (See Comments)    Stuffy  Nose    Health Maintenance Health Maintenance  Topic Date Due  . HIV Screening  03/21/1982  . PAP SMEAR  03/27/2019  . TETANUS/TDAP  06/19/2019  . INFLUENZA VACCINE  Completed     Exam:  BP 128/84   Pulse 100   Wt 250 lb (113.4 kg)   LMP  (LMP Unknown)   BMI 39.16 kg/m  Gen: Well NAD HEENT: EOMI,  MMM Lungs: Normal work of breathing. CTABL Heart: RRR no MRG Abd: NABS, Soft. Nondistended, Nontender Exts: Brisk capillary refill, warm and well perfused.  Left foot: Normal-appearing. Decreased motion of left first MTP joint. Nontender.   No results found for this or any previous visit (from the past 72 hour(s)). Dg Foot Complete Left  Result Date: 07/08/2016 CLINICAL DATA:  Left great toe pain EXAM: LEFT FOOT - COMPLETE 3+ VIEW COMPARISON:  10/23/2011 FINDINGS: Plantar calcaneal spur. No acute bony abnormality. Specifically, no fracture, subluxation, or dislocation. Soft tissues are intact. Joint spaces maintained. IMPRESSION: No acute bony abnormality. Electronically Signed   By: Rolm Baptise M.D.   On: 07/08/2016 10:42      Assessment and Plan: 50 y.o. female with  Left great toe pain: Likely hallux rigidus. No obvious fracture on x-ray today. Plan for  metatarsal toe plate over-the-counter.  GERD: Refilled omeprazole. Doing Well.    Hypertension: Refill hydrochlorothiazide. Last creatinine Lab Results  Component Value Date   CREATININE 0.9 02/26/2016  Recheck in 1 month or so.   Morbid obesity: Refer to general surgery for gastric bypass consultation.   Orders Placed This Encounter  Procedures  . DG Foot Complete Left    Standing Status:   Future    Number of Occurrences:   1    Standing Expiration Date:   09/05/2017    Order Specific Question:   Reason for Exam (SYMPTOM  OR DIAGNOSIS REQUIRED)    Answer:   eval pain 1st MTP    Order  Specific Question:   Is patient pregnant?    Answer:   No    Order Specific Question:   Preferred imaging location?    Answer:   [facility]  . Ambulatory referral to General Surgery    Referral Priority:   Routine    Referral Type:   Surgical    Referral Reason:   Specialty Services Required    Requested Specialty:   General Surgery    Number of Visits Requested:   1    Discussed warning signs or symptoms. Please see discharge instructions. Patient expresses understanding.

## 2016-07-26 ENCOUNTER — Encounter: Payer: Self-pay | Admitting: Family Medicine

## 2016-07-26 DIAGNOSIS — M1711 Unilateral primary osteoarthritis, right knee: Secondary | ICD-10-CM

## 2016-07-29 MED ORDER — MOMETASONE FURO-FORMOTEROL FUM 200-5 MCG/ACT IN AERO: 2.0000 | INHALATION_SPRAY | Freq: Two times a day (BID) | RESPIRATORY_TRACT | 5 refills | Status: DC

## 2016-07-29 MED ORDER — ALBUTEROL SULFATE (2.5 MG/3ML) 0.083% IN NEBU: 2.5000 mg | INHALATION_SOLUTION | RESPIRATORY_TRACT | 1 refills | Status: DC | PRN

## 2016-08-01 ENCOUNTER — Other Ambulatory Visit: Payer: Self-pay

## 2016-08-01 DIAGNOSIS — J45901 Unspecified asthma with (acute) exacerbation: Secondary | ICD-10-CM

## 2016-08-01 MED ORDER — ALBUTEROL SULFATE (2.5 MG/3ML) 0.083% IN NEBU: 2.5000 mg | INHALATION_SOLUTION | RESPIRATORY_TRACT | 1 refills | Status: DC | PRN

## 2016-08-08 ENCOUNTER — Encounter: Payer: Self-pay | Admitting: Family Medicine

## 2016-09-01 ENCOUNTER — Telehealth: Payer: Self-pay | Admitting: Family Medicine

## 2016-09-01 MED ORDER — BUDESONIDE-FORMOTEROL FUMARATE 160-4.5 MCG/ACT IN AERO: 2.0000 | INHALATION_SPRAY | Freq: Two times a day (BID) | RESPIRATORY_TRACT | 4 refills | Status: DC

## 2016-09-01 NOTE — Telephone Encounter (Signed)
Fax sent from pharmacy requesting switch from [facility] to Symbicort. Symbicort sent to pharmacy.

## 2016-09-01 NOTE — Telephone Encounter (Signed)
Information discussed with pt. Pt verbalized understanding. 

## 2016-09-03 ENCOUNTER — Other Ambulatory Visit (HOSPITAL_COMMUNITY): Payer: Self-pay | Admitting: General Surgery

## 2016-09-04 ENCOUNTER — Encounter: Payer: Self-pay | Admitting: Family Medicine

## 2016-09-05 ENCOUNTER — Encounter: Payer: Self-pay | Admitting: Family Medicine

## 2016-09-11 ENCOUNTER — Encounter: Payer: Self-pay | Admitting: Family Medicine

## 2016-09-19 ENCOUNTER — Ambulatory Visit (HOSPITAL_COMMUNITY)
Admission: RE | Admit: 2016-09-19 | Discharge: 2016-09-19 | Disposition: A | Discharge status: Home / Self Care | Payer: Medicare Other | Source: Ambulatory Visit | Attending: General Surgery | Admitting: General Surgery

## 2016-09-19 ENCOUNTER — Other Ambulatory Visit: Payer: Self-pay

## 2016-09-19 DIAGNOSIS — Z01810 Encounter for preprocedural cardiovascular examination: Secondary | ICD-10-CM | POA: Insufficient documentation

## 2016-09-22 ENCOUNTER — Ambulatory Visit (INDEPENDENT_AMBULATORY_CARE_PROVIDER_SITE_OTHER): Payer: Medicare Other | Admitting: Family Medicine

## 2016-09-22 ENCOUNTER — Encounter: Payer: Self-pay | Admitting: Family Medicine

## 2016-09-22 VITALS — BP 134/84 | HR 109 | Ht 67.0 in | Wt 260.0 lb

## 2016-09-22 DIAGNOSIS — M25561 Pain in right knee: Secondary | ICD-10-CM

## 2016-09-22 DIAGNOSIS — M1711 Unilateral primary osteoarthritis, right knee: Secondary | ICD-10-CM | POA: Diagnosis not present

## 2016-09-22 DIAGNOSIS — R05 Cough: Secondary | ICD-10-CM

## 2016-09-22 DIAGNOSIS — G8929 Other chronic pain: Secondary | ICD-10-CM | POA: Diagnosis not present

## 2016-09-22 DIAGNOSIS — R059 Cough, unspecified: Secondary | ICD-10-CM

## 2016-09-22 DIAGNOSIS — Z6841 Body Mass Index (BMI) 40.0 and over, adult: Secondary | ICD-10-CM | POA: Diagnosis not present

## 2016-09-22 MED ORDER — DICLOFENAC SODIUM 1 % TD GEL: 4.0000 g | Freq: Four times a day (QID) | TRANSDERMAL | 11 refills | Status: DC

## 2016-09-22 MED ORDER — BENZONATATE 200 MG PO CAPS: 200.0000 mg | ORAL_CAPSULE | Freq: Three times a day (TID) | ORAL | 0 refills | Status: DC | PRN

## 2016-09-22 NOTE — Patient Instructions (Addendum)
Thank you for coming in today. Return in 1 month.  Keep a 1 week food log before you return.   Use voltaren gel for pain.   Use tessalon for cough.

## 2016-09-22 NOTE — Progress Notes (Signed)
[name] is a 51 y.o. female who presents to [place]: [place] today for follow up morbid obesity.   [name] notes continued worsening morbid obesity complicated by HTN, HLD and Joint Pain. She has had a consultation with a general surgeon about potential barrier his surgery. She was notified that she needs to complete a six-month medical history advised weight loss program.  Over the last 2 months she's been trying to lose weight by drinking one protein shake a day. She estimates this is less than 200 cal per day. She notes that she's gained weight since January.  She also notes that she walks 2 miles per day at least. She has a follow-up appointment with a registered dietitian in late April.  Additionally she notes persistent cough. This is been ongoing for last month. She denies wheezing or shortness of breath. Cough is nonproductive.  Lastly she notes right knee pain. 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 She has pain is interfering with her activities  Past Medical History:  Diagnosis Date  . Allergy   . Asthma    History of Asthma  . Bipolar disorder ([place])   . Chest pain, atypical 11/30/2012  . Depression   . H/O degenerative disc disease    L4-L5, L5-S1  . Hyperglycemia    Postoperative hyperglycemia  . Hypertension   . Obesity 07/14/2012  . OSA (obstructive sleep apnea)    mild  . Polycystic disease, ovaries   . Postoperative anemia   . Reflux   . Sinus tachycardia 07/14/2012   Past Surgical History:  Procedure Laterality Date  . NASAL SINUS SURGERY  2004  . OVARIAN CYST REMOVAL  1998   A cyst on the fallopian tube removed  . SPINAL FUSION    . TONSILLECTOMY  1996   Social History  Substance Use Topics  . Smoking status: Never Smoker  . Smokeless tobacco: Never Used  . Alcohol use No   family history includes Cancer in her father, maternal  grandmother, and paternal grandmother.  ROS as above:  Medications: Current Outpatient Prescriptions  Medication Sig Dispense Refill  . albuterol (PROAIR HFA) 108 (90 BASE) MCG/ACT inhaler Inhale 2 puffs into the lungs every 4 (four) hours as needed for wheezing or shortness of breath.    Marland Kitchen albuterol (PROVENTIL) (2.5 MG/3ML) 0.083% nebulizer solution Take 3 mLs (2.5 mg total) by nebulization every 4 (four) hours as needed for wheezing or shortness of breath. 75 mL 1  . ALPRAZolam (XANAX) 1 MG tablet Take 1 mg by mouth.    . AMITIZA 24 MCG capsule     . bisoprolol (ZEBETA) 5 MG tablet TAKE 1 TABLET (5 MG TOTAL) BY MOUTH DAILY. 90 tablet 0  . budesonide-formoterol (SYMBICORT) 160-4.5 MCG/ACT inhaler Inhale 2 puffs into the lungs 2 (two) times daily. 3 Inhaler 4  . buPROPion (WELLBUTRIN XL) 150 MG 24 hr tablet     . buPROPion (WELLBUTRIN XL) 300 MG 24 hr tablet     . clonazePAM (KLONOPIN) 1 MG tablet Take 2 mg by mouth at bedtime.  5  . cyclobenzaprine (FLEXERIL) 10 MG tablet     . escitalopram (LEXAPRO) 10 MG tablet     . fentaNYL (DURAGESIC - DOSED MCG/HR) 50 MCG/HR     . fluticasone (FLONASE) 50 MCG/ACT nasal spray Place 1 spray into the nose daily.    . hydrochlorothiazide (MICROZIDE) 12.5 MG capsule Take 1 capsule (12.5 mg  total) by mouth daily. 90 capsule 3  . HYDROmorphone (DILAUDID) 2 MG tablet Take 2 mg by mouth every 6 (six) hours as needed for severe pain.    Marland Kitchen levonorgestrel-ethinyl estradiol (SEASONALE,INTROVALE,JOLESSA) 0.15-0.03 MG tablet TAKE 1 TABLET BY MOUTH DAILY.    . metFORMIN (GLUCOPHAGE) 1000 MG tablet Take 1,000 mg by mouth 2 (two) times daily with a meal.    . nortriptyline (PAMELOR) 75 MG capsule Take 75 mg by mouth.    Marland Kitchen omeprazole (PRILOSEC) 20 MG capsule Take 1 capsule (20 mg total) by mouth daily. 90 capsule 3  . perphenazine (TRILAFON) 4 MG tablet     . traZODone (DESYREL) 100 MG tablet Take 300 mg by mouth.    . valACYclovir (VALTREX) 1000 MG tablet TAKE 2  TABLETS (2,000 MG TOTAL) BY MOUTH 2 (TWO) TIMES DAILY.  6   Current Facility-Administered Medications  Medication Dose Route Frequency Provider Last Rate Last Dose  . methylPREDNISolone acetate (DEPO-MEDROL) injection 80 mg  80 mg Intramuscular Once Adelina Mings, MD       Allergies  Allergen Reactions  . Gabapentin Nausea And Vomiting and Anaphylaxis  . Pineapple Shortness Of Breath  . Shellfish Allergy Anaphylaxis  . Shellfish-Derived Products Anaphylaxis  . Erythromycin Swelling  . Latex Rash    Hives  . Pregabalin Swelling  . Pregabalin Swelling  . Duloxetine Itching and Other (See Comments)    Unknown reaction Unknown reaction  . Erythromycin Base Swelling  . Neuromuscular Blocking Agents   . Pollen Extract Other (See Comments)    Stuffy  Nose    Health Maintenance Health Maintenance  Topic Date Due  . HIV Screening  03/21/1982  . PAP SMEAR  03/27/2019  . TETANUS/TDAP  06/19/2019  . INFLUENZA VACCINE  Completed     Exam:  BP 134/84   Pulse (!) 109   Ht 5\' 7"  (1.702 m)   Wt 260 lb (117.9 kg)   BMI 40.72 kg/m  Gen: Well NAD HEENT: EOMI,  MMM Lungs: Normal work of breathing. CTABL Heart: Mild tachycardia no MRG Abd: NABS, Soft. Nondistended, Nontender Exts: Brisk capillary refill, warm and well perfused.  Right knee: No effusion. Crepitations with extension stable ligamentous exam.   No results found for this or any previous visit (from the past 72 hour(s)). Dg Chest 2 View  Result Date: 09/19/2016 CLINICAL DATA:  Preoperative exam.  Morbid obesity. EXAM: CHEST  2 VIEW COMPARISON:  19 2009 FINDINGS: The heart size and mediastinal contours are within normal limits. Both lungs are clear. The visualized skeletal structures are unremarkable. IMPRESSION: Normal exam. Electronically Signed   By: Lorriane Shire M.D.   On: 09/19/2016 10:59   Dg Duanne Limerick  W/kub  Result Date: 09/19/2016 CLINICAL DATA:  Morbid obesity.  Preoperative examination. EXAM: UPPER GI  SERIES WITH KUB TECHNIQUE: After obtaining a scout radiograph a routine upper GI series was performed using thin barium. FLUOROSCOPY TIME:  Fluoroscopy Time:  1 minutes 36 seconds Radiation Exposure Index (if provided by the fluoroscopic device): 56.7 mGy COMPARISON:  None. FINDINGS: The KUB demonstrates no abnormality of significance. Previous lower lumbar fusion. Bowel gas pattern is normal. The mucosa and motility of the esophagus are normal. No evidence of hiatal hernia or gastroesophageal reflux. The fundus, body, and antrum of the stomach are normal. The duodenal bulb and duodenal C-loop are normal. IMPRESSION: Normal upper GI. Electronically Signed   By: Lorriane Shire M.D.   On: 09/19/2016 10:58      Assessment  and Plan: 51 y.o. female with Morbid obesity. Plan for medically supervised weight loss. Recommended patient complete a seven-day food log and bring it to her next visit. She really should be losing weight as she is taking 200 cal a day.  As for cough will treat with Tessalon [place]. Recent x-ray was unremarkable.  As for knee pain this is DJD. Would like to avoid steroids due to weight gain. Plan for voltaren gel.  Recheck in 1 month.     No orders of the defined types were placed in this encounter.  Meds ordered this encounter  Medications  . valACYclovir (VALTREX) 1000 MG tablet    Sig: TAKE 2 TABLETS (2,000 MG TOTAL) BY MOUTH 2 (TWO) TIMES DAILY.    Refill:  6     Discussed warning signs or symptoms. Please see discharge instructions. Patient expresses understanding.

## 2016-10-20 ENCOUNTER — Encounter: Payer: Self-pay | Admitting: Family Medicine

## 2016-10-20 ENCOUNTER — Ambulatory Visit (INDEPENDENT_AMBULATORY_CARE_PROVIDER_SITE_OTHER): Payer: Medicare Other | Admitting: Family Medicine

## 2016-10-20 VITALS — BP 145/83 | HR 113 | Wt 255.0 lb

## 2016-10-20 DIAGNOSIS — I10 Essential (primary) hypertension: Secondary | ICD-10-CM

## 2016-10-20 DIAGNOSIS — Z6841 Body Mass Index (BMI) 40.0 and over, adult: Secondary | ICD-10-CM

## 2016-10-20 NOTE — Progress Notes (Signed)
[name] is a 51 y.o. female who presents to [place]: [place] today for follow-up medically supervised weight loss. When he has been doing well in the interim. She's managed to lose 5 pounds. She is adhering to a low calorie and low carbohydrate diet estimated less than 1200 cal per day. She has a follow-up appoint with a nutritionist later this month.  She's had significant stressors in her life. A close friend is moving to [place] and her father is more sick than usual. Her sister is also moving away and will be less able to help her father's medical care.   Past Medical History:  Diagnosis Date  . Allergy   . Asthma    History of Asthma  . Bipolar disorder ([place])   . Chest pain, atypical 11/30/2012  . Depression   . H/O degenerative disc disease    L4-L5, L5-S1  . Hyperglycemia    Postoperative hyperglycemia  . Hypertension   . Obesity 07/14/2012  . OSA (obstructive sleep apnea)    mild  . Polycystic disease, ovaries   . Postoperative anemia   . Reflux   . Sinus tachycardia 07/14/2012   Past Surgical History:  Procedure Laterality Date  . NASAL SINUS SURGERY  2004  . OVARIAN CYST REMOVAL  1998   A cyst on the fallopian tube removed  . SPINAL FUSION    . TONSILLECTOMY  1996   Social History  Substance Use Topics  . Smoking status: Never Smoker  . Smokeless tobacco: Never Used  . Alcohol use No   family history includes Cancer in her father, maternal grandmother, and paternal grandmother.  ROS as above:  Medications: Current Outpatient Prescriptions  Medication Sig Dispense Refill  . albuterol (PROAIR HFA) 108 (90 BASE) MCG/ACT inhaler Inhale 2 puffs into the lungs every 4 (four) hours as needed for wheezing or shortness of breath.    Marland Kitchen albuterol (PROVENTIL) (2.5 MG/3ML) 0.083% nebulizer solution Take 3 mLs (2.5 mg total) by nebulization every 4  (four) hours as needed for wheezing or shortness of breath. 75 mL 1  . ALPRAZolam (XANAX) 1 MG tablet Take 1 mg by mouth.    . AMITIZA 24 MCG capsule     . benzonatate (TESSALON) 200 MG capsule Take 1 capsule (200 mg total) by mouth 3 (three) times daily as needed for cough. 45 capsule 0  . bisoprolol (ZEBETA) 5 MG tablet TAKE 1 TABLET (5 MG TOTAL) BY MOUTH DAILY. 90 tablet 0  . budesonide-formoterol (SYMBICORT) 160-4.5 MCG/ACT inhaler Inhale 2 puffs into the lungs 2 (two) times daily. 3 Inhaler 4  . buPROPion (WELLBUTRIN XL) 150 MG 24 hr tablet     . buPROPion (WELLBUTRIN XL) 300 MG 24 hr tablet     . clonazePAM (KLONOPIN) 1 MG tablet Take 2 mg by mouth at bedtime.  5  . cyclobenzaprine (FLEXERIL) 10 MG tablet     . diclofenac sodium (VOLTAREN) 1 % GEL Apply 4 g topically 4 (four) times daily. To affected joint. 100 g 11  . escitalopram (LEXAPRO) 10 MG tablet     . fentaNYL (DURAGESIC - DOSED MCG/HR) 50 MCG/HR     . fluticasone (FLONASE) 50 MCG/ACT nasal spray Place 1 spray into the nose daily.    . hydrochlorothiazide (MICROZIDE) 12.5 MG capsule Take 1 capsule (12.5 mg total) by mouth daily. 90 capsule 3  . HYDROmorphone (DILAUDID) 2 MG tablet Take 2 mg by mouth  every 6 (six) hours as needed for severe pain.    Marland Kitchen levonorgestrel-ethinyl estradiol (SEASONALE,INTROVALE,JOLESSA) 0.15-0.03 MG tablet TAKE 1 TABLET BY MOUTH DAILY.    . metFORMIN (GLUCOPHAGE) 1000 MG tablet Take 1,000 mg by mouth 2 (two) times daily with a meal.    . nortriptyline (PAMELOR) 75 MG capsule Take 75 mg by mouth.    Marland Kitchen omeprazole (PRILOSEC) 20 MG capsule Take 1 capsule (20 mg total) by mouth daily. 90 capsule 3  . perphenazine (TRILAFON) 4 MG tablet     . traZODone (DESYREL) 100 MG tablet Take 300 mg by mouth.    . valACYclovir (VALTREX) 1000 MG tablet TAKE 2 TABLETS (2,000 MG TOTAL) BY MOUTH 2 (TWO) TIMES DAILY.  6   Current Facility-Administered Medications  Medication Dose Route Frequency Provider Last Rate Last Dose   . methylPREDNISolone acetate (DEPO-MEDROL) injection 80 mg  80 mg Intramuscular Once Adelina Mings, MD       Allergies  Allergen Reactions  . Gabapentin Nausea And Vomiting and Anaphylaxis  . Pineapple Shortness Of Breath  . Shellfish Allergy Anaphylaxis  . Shellfish-Derived Products Anaphylaxis  . Erythromycin Swelling  . Latex Rash    Hives  . Pregabalin Swelling  . Pregabalin Swelling  . Duloxetine Itching and Other (See Comments)    Unknown reaction Unknown reaction  . Erythromycin Base Swelling  . Neuromuscular Blocking Agents   . Pollen Extract Other (See Comments)    Stuffy  Nose    Health Maintenance Health Maintenance  Topic Date Due  . HIV Screening  03/21/1982  . INFLUENZA VACCINE  02/04/2017  . PAP SMEAR  03/27/2019  . TETANUS/TDAP  06/19/2019     Exam:  BP (!) 145/83   Pulse (!) 113   Wt 255 lb (115.7 kg)   BMI 39.94 kg/m   Wt Readings from Last 10 Encounters:  10/20/16 255 lb (115.7 kg)  09/22/16 260 lb (117.9 kg)  07/08/16 250 lb (113.4 kg)  05/13/16 251 lb (113.9 kg)  03/26/16 255 lb (115.7 kg)  03/13/16 259 lb (117.5 kg)  03/03/16 259 lb (117.5 kg)  11/01/15 262 lb 9.6 oz (119.1 kg)  05/28/15 250 lb 7.1 oz (113.6 kg)  05/18/13 278 lb (126.1 kg)    Gen: Well NAD HEENT: EOMI,  MMM Lungs: Normal work of breathing. CTABL Heart: RRR no MRG Abd: NABS, Soft. Nondistended, Nontender Exts: Brisk capillary refill, warm and well perfused.    No results found for this or any previous visit (from the past 72 hour(s)). No results found.    Assessment and Plan: 51 y.o. female with continued medical supervised weight loss. Continue current regimen. Recheck in one month. We'll check blood pressure in the near future if still elevated will continue to adjust.   No orders of the defined types were placed in this encounter.  No orders of the defined types were placed in this encounter.    Discussed warning signs or symptoms. Please see  discharge instructions. Patient expresses understanding.

## 2016-10-20 NOTE — Patient Instructions (Addendum)
Thank you for coming in today. You are doing well.  Continue current regemin.  Recheck in 1 month.   You are doing a great job.

## 2016-10-21 ENCOUNTER — Telehealth: Payer: Self-pay | Admitting: *Deleted

## 2016-10-21 NOTE — Telephone Encounter (Signed)
Pt in with her father this morning.  Recheck BP is 121/55.

## 2016-10-27 ENCOUNTER — Encounter: Payer: Medicare Other | Attending: General Surgery | Admitting: Skilled Nursing Facility1

## 2016-10-27 DIAGNOSIS — M5441 Lumbago with sciatica, right side: Secondary | ICD-10-CM | POA: Insufficient documentation

## 2016-10-27 DIAGNOSIS — K219 Gastro-esophageal reflux disease without esophagitis: Secondary | ICD-10-CM | POA: Insufficient documentation

## 2016-10-27 DIAGNOSIS — Z6841 Body Mass Index (BMI) 40.0 and over, adult: Secondary | ICD-10-CM | POA: Insufficient documentation

## 2016-10-27 DIAGNOSIS — F329 Major depressive disorder, single episode, unspecified: Secondary | ICD-10-CM | POA: Insufficient documentation

## 2016-10-27 DIAGNOSIS — I10 Essential (primary) hypertension: Secondary | ICD-10-CM | POA: Diagnosis not present

## 2016-10-27 DIAGNOSIS — Z713 Dietary counseling and surveillance: Secondary | ICD-10-CM | POA: Insufficient documentation

## 2016-10-27 DIAGNOSIS — E669 Obesity, unspecified: Secondary | ICD-10-CM

## 2016-10-27 DIAGNOSIS — G4733 Obstructive sleep apnea (adult) (pediatric): Secondary | ICD-10-CM | POA: Diagnosis not present

## 2016-10-27 NOTE — Progress Notes (Signed)
Pre-Op Assessment Visit:  Pre-Operative Sleeve Gastrectomy Surgery  Medical Nutrition Therapy:  Appt start time: 2:00  End time:  3:00  Patient was seen on 10/27/2016 for Pre-Operative Nutrition Assessment. Assessment and letter of approval faxed to [facility] Bariatric [facility] Program coordinator on 10/27/2016.   298 pounds to 252 pounds: lost 46 pounds; states she feels fine. Pt states she takes amateeza for constipation   Pt expectation of surgery: "To help me eat healthier"  Pt expectation of Dietitian: "To help me know what foods I should/not be eating"  Start weight at [facility]: 252.8.2 BMI: 41.00  24 hr Dietary Recall: Been eating like this for 2-3 months  First Meal: protein shake Snack:  Second Meal: 2 string cheese  Snack:  Third Meal: protein shake Snack:  Beverages: propel water, zero gatorade   Encouraged to engage in 150 minutes of moderate physical activity including cardiovascular and weight baring weekly  Handouts given during visit include:  . Pre-Op Goals . Bariatric Surgery Protein Shakes During the appointment today the following Pre-Op Goals were reviewed with the patient: . Maintain or lose weight as instructed by your surgeon . Make healthy food choices . Begin to limit portion sizes . Limited concentrated sugars and fried foods . Keep fat/sugar in the single digits per serving on          food labels . Practice CHEWING your food  (aim for 30 chews per bite or until applesauce consistency) . Practice not drinking 15 minutes before, during, and 30 minutes after each meal/snack . Avoid all carbonated beverages  . Avoid/limit caffeinated beverages  . Avoid all sugar-sweetened beverages . Consume 3 meals per day; eat every 3-5 hours . Make a list of non-food related activities . Aim for 64-100 ounces of FLUID daily  . Aim for at least 60-80 grams of PROTEIN daily . Look for a liquid protein source that contain ?15 g protein and ?5 g  carbohydrate  (ex: shakes, drinks, shots)  -Follow diet recommendations listed below   Energy and Macronutrient Recomendations: Calories: 1500 Carbohydrate: 170 Protein: 112 Fat: 42  Demonstrated degree of understanding via:  Teach Back  Teaching Method Utilized:  Visual Auditory Hands on  Barriers to learning/adherence to lifestyle change: none identified   Patient to call the Nutrition and [facility] Education Services to enroll in Pre-Op and Post-Op Nutrition Education when surgery date is scheduled.

## 2016-10-27 NOTE — Patient Instructions (Addendum)
-  Add vegetables back into your diet   -Add in fruits   -Use splenda in your tea  Follow Pre-Op Goals Try Protein Shakes Call [facility] at [phone] when surgery is scheduled to enroll in Pre-Op Class  Things to remember:  Please always be honest with [place]. We want to support you!  If you have any questions or concerns in between appointments, please call or email   The diet after surgery will be high protein and low in carbohydrate.  Vitamins and calcium need to be taken for the rest of your life.  Feel free to include support people in any classes or appointments.   Supplement recommendations:  Before Surgery   1 Complete Multivitamin with Iron  3000 IU Vitamin D3  After Surgery   2 Chewable Multivitamins  **Best Choice - Opurity: Bypass and Sleeve Optimized Chewable    Bariatric Advantage Advanced Multi EA      3 Chewable Calcium (500 mg each, total 1200-1500 mg per day)  **Best Choice - Celebrate, Bariatric Advantage, or Wellesse  Other Options:  2 Celebrate MultiComplete with 18 mg Iron (this provides 6000 IU of  Vitamin D3)  4 Celebrate Essential Multi 2 in 1 (has calcium) + 18-60 mg separate  iron  Vitamins and Calcium are available at:   [facility]   [place], [place], [place] [address]   [email or web address]  [email or web address]  www.[email or web address]

## 2016-11-10 ENCOUNTER — Encounter: Payer: Self-pay | Admitting: Family Medicine

## 2016-11-17 ENCOUNTER — Ambulatory Visit (INDEPENDENT_AMBULATORY_CARE_PROVIDER_SITE_OTHER): Payer: Medicare Other | Admitting: Family Medicine

## 2016-11-17 ENCOUNTER — Encounter: Payer: Self-pay | Admitting: Family Medicine

## 2016-11-17 VITALS — BP 121/70 | HR 105 | Ht 66.0 in | Wt 253.7 lb

## 2016-11-17 DIAGNOSIS — I10 Essential (primary) hypertension: Secondary | ICD-10-CM

## 2016-11-17 DIAGNOSIS — Z6841 Body Mass Index (BMI) 40.0 and over, adult: Secondary | ICD-10-CM | POA: Diagnosis not present

## 2016-11-17 DIAGNOSIS — K219 Gastro-esophageal reflux disease without esophagitis: Secondary | ICD-10-CM | POA: Diagnosis not present

## 2016-11-17 DIAGNOSIS — G4733 Obstructive sleep apnea (adult) (pediatric): Secondary | ICD-10-CM

## 2016-11-17 NOTE — Patient Instructions (Addendum)
Thank you for coming in today. You are doing great.  Continue to try to add occasional exercise if able.  Recheck in 1 month.

## 2016-11-17 NOTE — Progress Notes (Signed)
[name] is a 51 y.o. female who presents to [place]: [place] today for medically supervised weight loss. [name] has been doing well in the last month. She is eating a calorie restricted diet neck she is sizing at least 30 minutes a day every day. She continues to lose weight and feels pretty good. She's been seen by nutritionist. She's been adding fruits and vegetables to her diet.   Past Medical History:  Diagnosis Date  . Allergy   . Asthma    History of Asthma  . Bipolar disorder ([place])   . Chest pain, atypical 11/30/2012  . Depression   . H/O degenerative disc disease    L4-L5, L5-S1  . Hyperglycemia    Postoperative hyperglycemia  . Hypertension   . Obesity 07/14/2012  . OSA (obstructive sleep apnea)    mild  . Polycystic disease, ovaries   . Postoperative anemia   . Reflux   . Sinus tachycardia 07/14/2012   Past Surgical History:  Procedure Laterality Date  . NASAL SINUS SURGERY  2004  . OVARIAN CYST REMOVAL  1998   A cyst on the fallopian tube removed  . SPINAL FUSION    . TONSILLECTOMY  1996   Social History  Substance Use Topics  . Smoking status: Never Smoker  . Smokeless tobacco: Never Used  . Alcohol use No   family history includes Cancer in her father, maternal grandmother, and paternal grandmother.  ROS as above:  Medications: Current Outpatient Prescriptions  Medication Sig Dispense Refill  . albuterol (PROAIR HFA) 108 (90 BASE) MCG/ACT inhaler Inhale 2 puffs into the lungs every 4 (four) hours as needed for wheezing or shortness of breath.    Marland Kitchen albuterol (PROVENTIL) (2.5 MG/3ML) 0.083% nebulizer solution Take 3 mLs (2.5 mg total) by nebulization every 4 (four) hours as needed for wheezing or shortness of breath. 75 mL 1  . ALPRAZolam (XANAX) 1 MG tablet Take 1 mg by mouth.    . AMITIZA 24 MCG capsule     . budesonide-formoterol  (SYMBICORT) 160-4.5 MCG/ACT inhaler Inhale 2 puffs into the lungs 2 (two) times daily. 3 Inhaler 4  . buPROPion (WELLBUTRIN XL) 150 MG 24 hr tablet     . buPROPion (WELLBUTRIN XL) 300 MG 24 hr tablet     . clonazePAM (KLONOPIN) 1 MG tablet Take 2 mg by mouth at bedtime.  5  . cyclobenzaprine (FLEXERIL) 10 MG tablet     . diclofenac sodium (VOLTAREN) 1 % GEL Apply 4 g topically 4 (four) times daily. To affected joint. 100 g 11  . escitalopram (LEXAPRO) 10 MG tablet     . fentaNYL (DURAGESIC - DOSED MCG/HR) 50 MCG/HR     . fluticasone (FLONASE) 50 MCG/ACT nasal spray Place 1 spray into the nose daily.    . hydrochlorothiazide (MICROZIDE) 12.5 MG capsule Take 1 capsule (12.5 mg total) by mouth daily. 90 capsule 3  . HYDROmorphone (DILAUDID) 2 MG tablet Take 2 mg by mouth every 6 (six) hours as needed for severe pain.    Marland Kitchen levonorgestrel-ethinyl estradiol (SEASONALE,INTROVALE,JOLESSA) 0.15-0.03 MG tablet TAKE 1 TABLET BY MOUTH DAILY.    . metFORMIN (GLUCOPHAGE) 1000 MG tablet Take 1,000 mg by mouth 2 (two) times daily with a meal.    . nortriptyline (PAMELOR) 75 MG capsule Take 75 mg by mouth.    Marland Kitchen omeprazole (PRILOSEC) 20 MG capsule Take 1 capsule (20 mg total) by mouth daily. [place]  capsule 3  . perphenazine (TRILAFON) 4 MG tablet     . traZODone (DESYREL) 100 MG tablet Take 300 mg by mouth.    . valACYclovir (VALTREX) 1000 MG tablet TAKE 2 TABLETS (2,000 MG TOTAL) BY MOUTH 2 (TWO) TIMES DAILY.  6   Current Facility-Administered Medications  Medication Dose Route Frequency Provider Last Rate Last Dose  . methylPREDNISolone acetate (DEPO-MEDROL) injection 80 mg  80 mg Intramuscular Once Bobbitt, [place] Muta, MD       Allergies  Allergen Reactions  . Gabapentin Nausea And Vomiting and Anaphylaxis  . Pineapple Shortness Of Breath  . Shellfish Allergy Anaphylaxis  . Shellfish-Derived Products Anaphylaxis  . Erythromycin Swelling  . Latex Rash    Hives  . Pregabalin Swelling  . Pregabalin  Swelling  . Duloxetine Itching and Other (See Comments)    Unknown reaction Unknown reaction  . Erythromycin Base Swelling  . Neuromuscular Blocking Agents   . Pollen Extract Other (See Comments)    Stuffy  Nose    Health Maintenance Health Maintenance  Topic Date Due  . HIV Screening  12/03/2017 (Originally 03/21/1981)  . INFLUENZA VACCINE  02/04/2017  . PAP SMEAR  03/27/2019  . TETANUS/TDAP  06/19/2019     Exam:  BP 121/70   Pulse (!) 105   Ht 5\' 6"  (1.676 m)   Wt 253 lb 11.2 oz (115.1 kg)   SpO2 97%   BMI 40.95 kg/m   Wt Readings from Last 10 Encounters:  11/17/16 253 lb 11.2 oz (115.1 kg)  10/27/16 252 lb 8.2 oz (114.5 kg)  10/20/16 255 lb (115.7 kg)  09/22/16 260 lb (117.9 kg)  07/08/16 250 lb (113.4 kg)  05/13/16 251 lb (113.9 kg)  03/26/16 255 lb (115.7 kg)  03/13/16 259 lb (117.5 kg)  03/03/16 259 lb (117.5 kg)  11/01/15 262 lb 9.6 oz (119.1 kg)    Gen: Well NAD HEENT: EOMI,  MMM Lungs: Normal work of breathing. CTABL Heart: RRR no MRG Abd: NABS, Soft. Nondistended, Nontender Exts: Brisk capillary refill, warm and well perfused.    No results found for this or any previous visit (from the past 72 hour(s)). No results found.    Assessment and Plan: 51 y.o. female with  Medically supervised weight loss doing well. Continue current regimen. Recheck in one month.   No orders of the defined types were placed in this encounter.  No orders of the defined types were placed in this encounter.    Discussed warning signs or symptoms. Please see discharge instructions. Patient expresses understanding.

## 2016-11-20 ENCOUNTER — Other Ambulatory Visit: Payer: Self-pay | Admitting: Physical Medicine and Rehabilitation

## 2016-11-20 DIAGNOSIS — M545 Low back pain, unspecified: Secondary | ICD-10-CM

## 2016-12-14 ENCOUNTER — Ambulatory Visit
Admission: RE | Admit: 2016-12-14 | Discharge: 2016-12-14 | Disposition: A | Discharge status: Home / Self Care | Payer: Medicare Other | Source: Ambulatory Visit | Attending: Physical Medicine and Rehabilitation | Admitting: Physical Medicine and Rehabilitation

## 2016-12-14 DIAGNOSIS — M545 Low back pain, unspecified: Secondary | ICD-10-CM

## 2016-12-14 MED ORDER — GADOBENATE DIMEGLUMINE 529 MG/ML IV SOLN
20.0000 mL | Freq: Once | INTRAVENOUS | Status: AC | PRN
  Administered 2016-12-14: 20 mL via INTRAVENOUS

## 2016-12-15 ENCOUNTER — Encounter: Payer: Self-pay | Admitting: Family Medicine

## 2016-12-15 ENCOUNTER — Ambulatory Visit (INDEPENDENT_AMBULATORY_CARE_PROVIDER_SITE_OTHER): Payer: Medicare Other | Admitting: Family Medicine

## 2016-12-15 VITALS — BP 124/68 | HR 93 | Wt 250.0 lb

## 2016-12-15 DIAGNOSIS — Z6841 Body Mass Index (BMI) 40.0 and over, adult: Secondary | ICD-10-CM

## 2016-12-15 DIAGNOSIS — I10 Essential (primary) hypertension: Secondary | ICD-10-CM

## 2016-12-15 NOTE — Progress Notes (Signed)
[name] is a 51 y.o. female who presents to [place]: [place] today for follow-up weight. Patient has done well with her medically supervised weight loss. She continues to eat a calorie restricted diet. She locks her food every day and feels like she's doing pretty well. She had a bit of a setback with exercise. She's had a repeat exacerbation of her low back pain and had to decrease her exercise a bit. She feels as though things are going well. She notes that she will be going on a cruise soon and is worried about weight gain   Past Medical History:  Diagnosis Date  . Allergy   . Asthma    History of Asthma  . Bipolar disorder ([place])   . Chest pain, atypical 11/30/2012  . Depression   . H/O degenerative disc disease    L4-L5, L5-S1  . Hyperglycemia    Postoperative hyperglycemia  . Hypertension   . Obesity 07/14/2012  . OSA (obstructive sleep apnea)    mild  . Polycystic disease, ovaries   . Postoperative anemia   . Reflux   . Sinus tachycardia 07/14/2012   Past Surgical History:  Procedure Laterality Date  . NASAL SINUS SURGERY  2004  . OVARIAN CYST REMOVAL  1998   A cyst on the fallopian tube removed  . SPINAL FUSION    . TONSILLECTOMY  1996   Social History  Substance Use Topics  . Smoking status: Never Smoker  . Smokeless tobacco: Never Used  . Alcohol use No   family history includes Cancer in her father, maternal grandmother, and paternal grandmother.  ROS as above:  Medications: Current Outpatient Prescriptions  Medication Sig Dispense Refill  . albuterol (PROAIR HFA) 108 (90 BASE) MCG/ACT inhaler Inhale 2 puffs into the lungs every 4 (four) hours as needed for wheezing or shortness of breath.    Marland Kitchen albuterol (PROVENTIL) (2.5 MG/3ML) 0.083% nebulizer solution Take 3 mLs (2.5 mg total) by nebulization every 4 (four) hours as needed for wheezing  or shortness of breath. 75 mL 1  . ALPRAZolam (XANAX) 1 MG tablet Take 1 mg by mouth.    . AMITIZA 24 MCG capsule     . budesonide-formoterol (SYMBICORT) 160-4.5 MCG/ACT inhaler Inhale 2 puffs into the lungs 2 (two) times daily. 3 Inhaler 4  . buPROPion (WELLBUTRIN XL) 150 MG 24 hr tablet     . buPROPion (WELLBUTRIN XL) 300 MG 24 hr tablet     . clonazePAM (KLONOPIN) 1 MG tablet Take 2 mg by mouth at bedtime.  5  . cyclobenzaprine (FLEXERIL) 10 MG tablet     . diclofenac sodium (VOLTAREN) 1 % GEL Apply 4 g topically 4 (four) times daily. To affected joint. 100 g 11  . escitalopram (LEXAPRO) 10 MG tablet     . fentaNYL (DURAGESIC - DOSED MCG/HR) 50 MCG/HR     . fluticasone (FLONASE) 50 MCG/ACT nasal spray Place 1 spray into the nose daily.    . hydrochlorothiazide (MICROZIDE) 12.5 MG capsule Take 1 capsule (12.5 mg total) by mouth daily. 90 capsule 3  . HYDROmorphone (DILAUDID) 2 MG tablet Take 2 mg by mouth every 6 (six) hours as needed for severe pain.    Marland Kitchen levonorgestrel-ethinyl estradiol (SEASONALE,INTROVALE,JOLESSA) 0.15-0.03 MG tablet TAKE 1 TABLET BY MOUTH DAILY.    . metFORMIN (GLUCOPHAGE) 1000 MG tablet Take 1,000 mg by mouth 2 (two) times daily with a meal.    .  nortriptyline (PAMELOR) 75 MG capsule Take 75 mg by mouth.    Marland Kitchen omeprazole (PRILOSEC) 20 MG capsule Take 1 capsule (20 mg total) by mouth daily. 90 capsule 3  . perphenazine (TRILAFON) 4 MG tablet     . traZODone (DESYREL) 100 MG tablet Take 300 mg by mouth.    . valACYclovir (VALTREX) 1000 MG tablet TAKE 2 TABLETS (2,000 MG TOTAL) BY MOUTH 2 (TWO) TIMES DAILY.  6   Current Facility-Administered Medications  Medication Dose Route Frequency Provider Last Rate Last Dose  . methylPREDNISolone acetate (DEPO-MEDROL) injection 80 mg  80 mg Intramuscular Once Bobbitt, [place] Muta, MD       Allergies  Allergen Reactions  . Gabapentin Nausea And Vomiting and Anaphylaxis  . Pineapple Shortness Of Breath  . Shellfish Allergy  Anaphylaxis  . Shellfish-Derived Products Anaphylaxis  . Erythromycin Swelling  . Latex Rash    Hives  . Pregabalin Swelling  . Pregabalin Swelling  . Duloxetine Itching and Other (See Comments)    Unknown reaction Unknown reaction  . Erythromycin Base Swelling  . Neuromuscular Blocking Agents   . Pollen Extract Other (See Comments)    Stuffy  Nose    Health Maintenance Health Maintenance  Topic Date Due  . HIV Screening  12/03/2017 (Originally 03/21/1981)  . INFLUENZA VACCINE  02/04/2017  . PAP SMEAR  03/27/2019  . TETANUS/TDAP  06/19/2019     Exam:  BP 124/68   Pulse 93   Wt 250 lb (113.4 kg)   BMI 40.35 kg/m   Wt Readings from Last 10 Encounters:  12/15/16 250 lb (113.4 kg)  11/17/16 253 lb 11.2 oz (115.1 kg)  10/27/16 252 lb 8.2 oz (114.5 kg)  10/20/16 255 lb (115.7 kg)  09/22/16 260 lb (117.9 kg)  07/08/16 250 lb (113.4 kg)  05/13/16 251 lb (113.9 kg)  03/26/16 255 lb (115.7 kg)  03/13/16 259 lb (117.5 kg)  03/03/16 259 lb (117.5 kg)    Gen: Well NAD HEENT: EOMI,  MMM Lungs: Normal work of breathing. CTABL Heart: RRR no MRG Abd: NABS, Soft. Nondistended, Nontender Exts: Brisk capillary refill, warm and well perfused.        Assessment and Plan: 51 y.o. female with morbid obesity: Doing well. Continue medically supervised weight loss with calories traction. Opportunity exercise as needed. Recheck in one month.   No orders of the defined types were placed in this encounter.  No orders of the defined types were placed in this encounter.    Discussed warning signs or symptoms. Please see discharge instructions. Patient expresses understanding.

## 2016-12-15 NOTE — Patient Instructions (Signed)
Thank you for coming in today. You are doing great Recheck in 1 month

## 2017-01-14 ENCOUNTER — Ambulatory Visit (INDEPENDENT_AMBULATORY_CARE_PROVIDER_SITE_OTHER): Payer: Medicare Other | Admitting: Family Medicine

## 2017-01-14 ENCOUNTER — Encounter: Payer: Self-pay | Admitting: Family Medicine

## 2017-01-14 VITALS — BP 132/49 | HR 88 | Wt 246.0 lb

## 2017-01-14 DIAGNOSIS — M545 Low back pain, unspecified: Secondary | ICD-10-CM

## 2017-01-14 DIAGNOSIS — Z6841 Body Mass Index (BMI) 40.0 and over, adult: Secondary | ICD-10-CM | POA: Diagnosis not present

## 2017-01-14 DIAGNOSIS — G8929 Other chronic pain: Secondary | ICD-10-CM

## 2017-01-14 NOTE — Patient Instructions (Addendum)
Thank you for coming in today. You are doing well. Recheck in 1 month.   We will get records from Dr Do[facility] [place] office for labs.   I will fill out the back brace part the next time they request.

## 2017-01-14 NOTE — Progress Notes (Signed)
[name] is a 51 y.o. female who presents to [place] today for follow-up weight loss. Patient has done well in the last month with medical supervised weight loss. She eats a calorie restricted diet and keeps a dedicated food log. She has increased her exercise by walking in the last month which was one of her interventions. She feels quite well and is looking forward to her gastric bypass in the near future.    additionally patient notes chronic back pain previously discussed. She is interested in a back brace for this issue of possible. She thinks it will be helpful to help support her activity.   Past Medical History:  Diagnosis Date  . Allergy   . Asthma    History of Asthma  . Bipolar disorder ([place])   . Chest pain, atypical 11/30/2012  . Depression   . H/O degenerative disc disease    L4-L5, L5-S1  . Hyperglycemia    Postoperative hyperglycemia  . Hypertension   . Obesity 07/14/2012  . OSA (obstructive sleep apnea)    mild  . Polycystic disease, ovaries   . Postoperative anemia   . Reflux   . Sinus tachycardia 07/14/2012   Past Surgical History:  Procedure Laterality Date  . NASAL SINUS SURGERY  2004  . OVARIAN CYST REMOVAL  1998   A cyst on the fallopian tube removed  . SPINAL FUSION    . TONSILLECTOMY  1996   Social History  Substance Use Topics  . Smoking status: Never Smoker  . Smokeless tobacco: Never Used  . Alcohol use No   family history includes Cancer in her father, maternal grandmother, and paternal grandmother.  ROS as above:  Medications: Current Outpatient Prescriptions  Medication Sig Dispense Refill  . albuterol (PROAIR HFA) 108 (90 BASE) MCG/ACT inhaler Inhale 2 puffs into the lungs every 4 (four) hours as needed for wheezing or shortness of breath.    Marland Kitchen albuterol (PROVENTIL) (2.5 MG/3ML) 0.083% nebulizer solution  Take 3 mLs (2.5 mg total) by nebulization every 4 (four) hours as needed for wheezing or shortness of breath. 75 mL 1  . ALPRAZolam (XANAX) 1 MG tablet Take 1 mg by mouth.    . AMITIZA 24 MCG capsule     . budesonide-formoterol (SYMBICORT) 160-4.5 MCG/ACT inhaler Inhale 2 puffs into the lungs 2 (two) times daily. 3 Inhaler 4  . buPROPion (WELLBUTRIN XL) 150 MG 24 hr tablet     . buPROPion (WELLBUTRIN XL) 300 MG 24 hr tablet     . clonazePAM (KLONOPIN) 1 MG tablet Take 2 mg by mouth at bedtime.  5  . cyclobenzaprine (FLEXERIL) 10 MG tablet     . diclofenac sodium (VOLTAREN) 1 % GEL Apply 4 g topically 4 (four) times daily. To affected joint. 100 g 11  . escitalopram (LEXAPRO) 10 MG tablet     . fentaNYL (DURAGESIC - DOSED MCG/HR) 50 MCG/HR     . fluticasone (FLONASE) 50 MCG/ACT nasal spray Place 1 spray into the nose daily.    . hydrochlorothiazide (MICROZIDE) 12.5 MG capsule Take 1 capsule (12.5 mg total) by mouth daily. 90 capsule 3  . HYDROmorphone (DILAUDID) 2 MG tablet Take 2 mg by mouth every 6 (six) hours as needed for severe pain.    Marland Kitchen levonorgestrel-ethinyl estradiol (SEASONALE,INTROVALE,JOLESSA) 0.15-0.03 MG tablet TAKE 1 TABLET BY MOUTH DAILY.    . metFORMIN (GLUCOPHAGE) 1000 MG tablet Take 1,000 mg by mouth  2 (two) times daily with a meal.    . nortriptyline (PAMELOR) 75 MG capsule Take 75 mg by mouth.    Marland Kitchen omeprazole (PRILOSEC) 20 MG capsule Take 1 capsule (20 mg total) by mouth daily. 90 capsule 3  . perphenazine (TRILAFON) 4 MG tablet     . traZODone (DESYREL) 100 MG tablet Take 300 mg by mouth.    . valACYclovir (VALTREX) 1000 MG tablet TAKE 2 TABLETS (2,000 MG TOTAL) BY MOUTH 2 (TWO) TIMES DAILY.  6   Current Facility-Administered Medications  Medication Dose Route Frequency Provider Last Rate Last Dose  . methylPREDNISolone acetate (DEPO-MEDROL) injection 80 mg  80 mg Intramuscular Once Bobbitt, [place] Muta, MD       Allergies  Allergen Reactions  . Gabapentin Nausea And  Vomiting and Anaphylaxis  . Pineapple Shortness Of Breath  . Shellfish Allergy Anaphylaxis  . Shellfish-Derived Products Anaphylaxis  . Erythromycin Swelling  . Latex Rash    Hives  . Pregabalin Swelling  . Pregabalin Swelling  . Duloxetine Itching and Other (See Comments)    Unknown reaction Unknown reaction  . Erythromycin Base Swelling  . Neuromuscular Blocking Agents   . Pollen Extract Other (See Comments)    Stuffy  Nose    Health Maintenance Health Maintenance  Topic Date Due  . HIV Screening  12/03/2017 (Originally 03/21/1981)  . INFLUENZA VACCINE  02/04/2017  . PAP SMEAR  03/27/2019  . TETANUS/TDAP  06/19/2019     Exam:  BP (!) 132/49   Pulse 88   Wt 246 lb (111.6 kg)   SpO2 96%   BMI 39.71 kg/m   Wt Readings from Last 10 Encounters:  01/14/17 246 lb (111.6 kg)  12/15/16 250 lb (113.4 kg)  11/17/16 253 lb 11.2 oz (115.1 kg)  10/27/16 252 lb 8.2 oz (114.5 kg)  10/20/16 255 lb (115.7 kg)  09/22/16 260 lb (117.9 kg)  07/08/16 250 lb (113.4 kg)  05/13/16 251 lb (113.9 kg)  03/26/16 255 lb (115.7 kg)  03/13/16 259 lb (117.5 kg)    Gen: Well NAD HEENT: EOMI,  MMM Lungs: Normal work of breathing. CTABL Heart: RRR no MRG Abd: NABS, Soft. Nondistended, Nontender Exts: Brisk capillary refill, warm and well perfused.  L spine: Nontender to spinal midline. Decreased back motion due to pain.   No results found for this or any previous visit (from the past 72 hour(s)). No results found.    Assessment and Plan: 51 y.o. female with  Obesity: Doing well. Continue weight loss. Patient has lost 14 pounds in the last 5 months. She has kept a dedicated food log and seems to be doing quite well. Plan to recheck in one month.  Back pain: Agree for brace. Will fill out form when it's faxed.   No orders of the defined types were placed in this encounter.  Meds ordered this encounter  Medications  . DISCONTD: nortriptyline (PAMELOR) 25 MG capsule    Sig: Take 50  mg by mouth at bedtime.    Refill:  2     Discussed warning signs or symptoms. Please see discharge instructions. Patient expresses understanding.

## 2017-02-11 ENCOUNTER — Ambulatory Visit (INDEPENDENT_AMBULATORY_CARE_PROVIDER_SITE_OTHER): Payer: Medicare Other | Admitting: Family Medicine

## 2017-02-11 ENCOUNTER — Encounter: Payer: Self-pay | Admitting: Family Medicine

## 2017-02-11 MED ORDER — VALACYCLOVIR HCL 1 G PO TABS: 1000.0000 mg | ORAL_TABLET | Freq: Two times a day (BID) | ORAL | 12 refills | Status: DC

## 2017-02-11 NOTE — Patient Instructions (Addendum)
Thank you for coming in today. You are doing great! The skills that you have learned over the last 6 months will help a lot.  I will fax all the notes and forms from the last 6 visits to [facility].  Please follow up with them in 1 week if you do not hear anything.   It is OK to exercise.   Doing cycling especially on a recumbent bike is recommended for chronic knee pain.   Change resistance setting frequently.  Let me know how you are doing.   Lets check back a few months after bypass or sooner if needed.

## 2017-02-11 NOTE — Progress Notes (Signed)
[name] is a 51 y.o. female who presents to [facility] today for follow-up weight management. [name] has been doing well over the last several months for medically supervised weight loss. Over the last month she's lost 11 pounds by using a rigorous food logging technique with a relatively low carbohydrate diet. She is interested in increasing her activity level by going to the gym if possible. She would like to do recumbent cycling.   Past Medical History:  Diagnosis Date  . Allergy   . Asthma    History of Asthma  . Bipolar disorder ([place])   . Chest pain, atypical 11/30/2012  . Depression   . H/O degenerative disc disease    L4-L5, L5-S1  . Hyperglycemia    Postoperative hyperglycemia  . Hypertension   . Obesity 07/14/2012  . OSA (obstructive sleep apnea)    mild  . Polycystic disease, ovaries   . Postoperative anemia   . Reflux   . Sinus tachycardia 07/14/2012   Past Surgical History:  Procedure Laterality Date  . NASAL SINUS SURGERY  2004  . OVARIAN CYST REMOVAL  1998   A cyst on the fallopian tube removed  . SPINAL FUSION    . TONSILLECTOMY  1996   Social History  Substance Use Topics  . Smoking status: Never Smoker  . Smokeless tobacco: Never Used  . Alcohol use No   family history includes Cancer in her father, maternal grandmother, and paternal grandmother.  ROS as above:  Medications: Current Outpatient Prescriptions  Medication Sig Dispense Refill  . albuterol (PROAIR HFA) 108 (90 BASE) MCG/ACT inhaler Inhale 2 puffs into the lungs every 4 (four) hours as needed for wheezing or shortness of breath.    Marland Kitchen albuterol (PROVENTIL) (2.5 MG/3ML) 0.083% nebulizer solution Take 3 mLs (2.5 mg total) by nebulization every 4 (four) hours as needed for wheezing or shortness of breath. 75 mL 1  . ALPRAZolam (XANAX) 1 MG tablet Take 1 mg by mouth.      . AMITIZA 24 MCG capsule     . budesonide-formoterol (SYMBICORT) 160-4.5 MCG/ACT inhaler Inhale 2 puffs into the lungs 2 (two) times daily. 3 Inhaler 4  . buPROPion (WELLBUTRIN XL) 150 MG 24 hr tablet     . buPROPion (WELLBUTRIN XL) 300 MG 24 hr tablet     . clonazePAM (KLONOPIN) 1 MG tablet Take 2 mg by mouth at bedtime.  5  . cyclobenzaprine (FLEXERIL) 10 MG tablet     . diclofenac sodium (VOLTAREN) 1 % GEL Apply 4 g topically 4 (four) times daily. To affected joint. 100 g 11  . escitalopram (LEXAPRO) 10 MG tablet     . fentaNYL (DURAGESIC - DOSED MCG/HR) 50 MCG/HR     . fluticasone (FLONASE) 50 MCG/ACT nasal spray Place 1 spray into the nose daily.    . hydrochlorothiazide (MICROZIDE) 12.5 MG capsule Take 1 capsule (12.5 mg total) by mouth daily. 90 capsule 3  . HYDROmorphone (DILAUDID) 2 MG tablet Take 2 mg by mouth every 6 (six) hours as needed for severe pain.    Marland Kitchen levonorgestrel-ethinyl estradiol (SEASONALE,INTROVALE,JOLESSA) 0.15-0.03 MG tablet TAKE 1 TABLET BY MOUTH DAILY.    . metFORMIN (GLUCOPHAGE) 1000 MG tablet Take 1,000 mg by mouth 2 (two) times daily with a meal.    . nortriptyline (PAMELOR) 75 MG capsule Take 75 mg by mouth.    Marland Kitchen omeprazole (PRILOSEC) 20 MG capsule Take 1 capsule (  20 mg total) by mouth daily. 90 capsule 3  . perphenazine (TRILAFON) 4 MG tablet     . traZODone (DESYREL) 100 MG tablet Take 300 mg by mouth.    . valACYclovir (VALTREX) 1000 MG tablet TAKE 2 TABLETS (2,000 MG TOTAL) BY MOUTH 2 (TWO) TIMES DAILY.  6   Current Facility-Administered Medications  Medication Dose Route Frequency Provider Last Rate Last Dose  . methylPREDNISolone acetate (DEPO-MEDROL) injection 80 mg  80 mg Intramuscular Once Bobbitt, [place] Muta, MD       Allergies  Allergen Reactions  . Gabapentin Nausea And Vomiting and Anaphylaxis  . Pineapple Shortness Of Breath  . Shellfish Allergy Anaphylaxis  . Shellfish-Derived Products Anaphylaxis  . Erythromycin Swelling  . Latex  Rash    Hives  . Pregabalin Swelling  . Pregabalin Swelling  . Duloxetine Itching and Other (See Comments)    Unknown reaction Unknown reaction  . Erythromycin Base Swelling  . Neuromuscular Blocking Agents   . Pollen Extract Other (See Comments)    Stuffy  Nose    Health Maintenance Health Maintenance  Topic Date Due  . INFLUENZA VACCINE  02/04/2017  . HIV Screening  12/03/2017 (Originally 03/21/1981)  . PAP SMEAR  03/27/2019  . TETANUS/TDAP  06/19/2019     Exam:  BP 125/84   Pulse (!) 101   Ht 5\' 6"  (1.676 m)   Wt 235 lb (106.6 kg)   BMI 37.93 kg/m   Wt Readings from Last 10 Encounters:  02/11/17 235 lb (106.6 kg)  01/14/17 246 lb (111.6 kg)  12/15/16 250 lb (113.4 kg)  11/17/16 253 lb 11.2 oz (115.1 kg)  10/27/16 252 lb 8.2 oz (114.5 kg)  10/20/16 255 lb (115.7 kg)  09/22/16 260 lb (117.9 kg)  07/08/16 250 lb (113.4 kg)  05/13/16 251 lb (113.9 kg)  03/26/16 255 lb (115.7 kg)    Gen: Well NAD HEENT: EOMI,  MMM Lungs: Normal work of breathing. CTABL Heart: RRR no MRG Abd: NABS, Soft. Nondistended, Nontender Exts: Brisk capillary refill, warm and well perfused.    No results found for this or any previous visit (from the past 72 hour(s)). No results found.    Assessment and Plan: 51 y.o. female with Medical supervised weight loss. Doing quite well. Patient has now completed 6 months of supervised weight loss. Plan to refer back to bariatric surgery for potential gastric bypass. Recheck with me in a few months after gastric bypass or sooner if needed.   No orders of the defined types were placed in this encounter.  No orders of the defined types were placed in this encounter.    Discussed warning signs or symptoms. Please see discharge instructions. Patient expresses understanding.

## 2017-02-16 ENCOUNTER — Encounter: Payer: Medicare Other | Attending: General Surgery | Admitting: Skilled Nursing Facility1

## 2017-02-16 DIAGNOSIS — Z713 Dietary counseling and surveillance: Secondary | ICD-10-CM | POA: Insufficient documentation

## 2017-02-16 DIAGNOSIS — G4733 Obstructive sleep apnea (adult) (pediatric): Secondary | ICD-10-CM | POA: Insufficient documentation

## 2017-02-16 DIAGNOSIS — Z6841 Body Mass Index (BMI) 40.0 and over, adult: Secondary | ICD-10-CM | POA: Diagnosis not present

## 2017-02-16 DIAGNOSIS — E669 Obesity, unspecified: Secondary | ICD-10-CM

## 2017-02-17 ENCOUNTER — Encounter: Payer: Self-pay | Admitting: Skilled Nursing Facility1

## 2017-02-17 NOTE — Progress Notes (Signed)
Pre-Operative Nutrition Class:  Appt start time: 1730   End time:  1830.  Patient was seen on 02/17/2017 for Pre-Operative Bariatric Surgery Education at the [facility].   Surgery date:  Surgery type: Sleeve Gastrectomy Start weight at [facility]: 252.8.2 BMI: 38.74 Current Weight: 240 pounds  Samples given per MNT protocol. Patient educated on appropriate usage: Bariatric Advantage Multivitamin Bariatric Advantage Calcium  Lot # [id number] Exp: sep-26-2018  Celebrate Vitamins Calcium  Lot # [id number] Exp: 06/2018  Unjury powder Lot # [id number] Exp: 09/2017  The following the learning objectives were met by the patient during this course:  Identify Pre-Op Dietary Goals and will begin 2 weeks pre-operatively  Identify appropriate sources of fluids and proteins   State protein recommendations and appropriate sources pre and post-operatively  Identify Post-Operative Dietary Goals and will follow for 2 weeks post-operatively  Identify appropriate multivitamin and calcium sources  Describe the need for physical activity post-operatively and will follow MD recommendations  State when to call healthcare provider regarding medication questions or post-operative complications  Handouts given during class include:  Pre-Op Bariatric Surgery Diet Handout  Protein Shake Handout  Post-Op Bariatric Surgery Nutrition Handout  [facility] Program Information Flyer  Support Group Information Flyer  [facility] Bariatric Supplements Price List  Follow-Up Plan: Patient will follow-up at [facility] 2 weeks post operatively for diet advancement per MD.   

## 2017-02-18 ENCOUNTER — Encounter: Payer: Self-pay | Admitting: Skilled Nursing Facility1

## 2017-02-18 ENCOUNTER — Encounter: Payer: Medicare Other | Admitting: Skilled Nursing Facility1

## 2017-02-18 DIAGNOSIS — Z713 Dietary counseling and surveillance: Secondary | ICD-10-CM | POA: Diagnosis not present

## 2017-02-18 DIAGNOSIS — E669 Obesity, unspecified: Secondary | ICD-10-CM

## 2017-02-18 NOTE — Progress Notes (Signed)
   Assessment:   Last [facility] Appointment.   Pt has lost 13 pounds from her first assessment. Pt has back injuries. Pt has been doing a wonderful job.   Start Wt at [facility]: 252.3 Wt: 239.14 BMI: 38.72   MEDICATIONS: See List   DIETARY INTAKE:  24-hr recall:  B ( AM): protein shake premier smoothie with fruit and veggies Snk ( AM): nuts with cranberries and cheese L ( PM): banana sandwich Snk ( PM):  D ( PM): grilled chicken salad with crutons  Snk ( PM):  Beverages: water, monster drink  Usual physical activity: walking 7 days a week for 30 mintues  Diet to Follow: 1600 calories 180 g carbohydrates 120 g protein 44 g fat   Nutritional Diagnosis:  [place]-3.3 Overweight/obesity related to past poor dietary habits and physical inactivity as evidenced by patient w/ planned sleeve gastrectomy surgery following dietary guidelines for continued weight loss.    Intervention:  Nutrition counseling for upcoming Bariatric Surgery. Goals: -Encouraged to engage in 150 minutes of moderate physical activity including cardiovascular and weight baring weekly  Teaching Method Utilized:  Visual Auditory Hands on  Barriers to learning/adherence to lifestyle change: none identified   Demonstrated degree of understanding via:  Teach Back   Monitoring/Evaluation:  Dietary intake, exercise, and body weight prn.

## 2017-03-03 ENCOUNTER — Ambulatory Visit: Payer: Medicare Other | Admitting: Psychiatry

## 2017-03-04 ENCOUNTER — Ambulatory Visit (INDEPENDENT_AMBULATORY_CARE_PROVIDER_SITE_OTHER): Payer: Medicare Other | Admitting: Physician Assistant

## 2017-03-04 ENCOUNTER — Encounter: Payer: Self-pay | Admitting: Physician Assistant

## 2017-03-04 VITALS — BP 134/84 | HR 109 | Ht 66.0 in | Wt 237.0 lb

## 2017-03-04 DIAGNOSIS — E785 Hyperlipidemia, unspecified: Secondary | ICD-10-CM | POA: Diagnosis not present

## 2017-03-04 DIAGNOSIS — I10 Essential (primary) hypertension: Secondary | ICD-10-CM

## 2017-03-04 DIAGNOSIS — Z01818 Encounter for other preprocedural examination: Secondary | ICD-10-CM

## 2017-03-04 DIAGNOSIS — R000 Tachycardia, unspecified: Secondary | ICD-10-CM

## 2017-03-04 NOTE — Patient Instructions (Signed)
Medication Instructions:   No changes to current medications.  Labwork:   none  Testing/Procedures:  none  Follow-Up:  1 year with Dr. [place]ique  If you need a refill on your cardiac medications before your next appointment, please call your pharmacy.

## 2017-03-04 NOTE — Progress Notes (Signed)
Cardiology Office Note    Date:  03/05/2017   ID:  [name], Rice 12-07-1965, MRN [id number]  PCP:  Gregor Hams, MD  Cardiologist:  Dr. Martinique  Chief Complaint  Patient presents with  . Follow-up    seen for Dr. Martinique  . Pre-op Exam    gastric sleeve surgery by Dr. Greer Pickerel    History of Present Illness:  [name] is a 51 y.o. female with PMH of morbid obesity, bipolar disorder, PCOS, HTN, HLD, and persistent tachycardia. She does not use stimulants. Her tachycardia was previously managed via beta blocker therapy. Beta blocker therapy was discontinued 2 years ago as she is not having any symptom associated with tachycardia. Last echocardiogram obtained on 07/22/2012 showed EF 50-55%, mildly dilated LA. She was last seen by Dr. Martinique on 11/01/2015, she was complaining of some atypical chest pain. Myoview obtained on 11/22/2015 showed EF 66%, medium defect present in the basal anteroseptal, basal inferoseptal, mid anteroseptal, mid inferoseptal and apex location consistent with breast artifact and apical thinning, overall it was a low risk study. Last lipid panel obtained on 02/24/2016 showed total cholesterol 208, LDL 137, HDL 50, triglyceride 103.  Patient presents today for preop clearance at the request of Dr. Greer Pickerel prior to gastric sleeve surgery. She is eager for weight loss. Otherwise, she has not noticed any significant chest discomfort, shortness of breath, lower extremity edema, orthopnea or PND. She is able to walk at least 4 blocks away from her house and back without any significant shortness of breath. She is able to climb at least 2 flights of stairs without any exertional chest discomfort either. At this time, I do not recommend any further testing. I have discussed the case with DOD Dr. Ellyn Hack who also agrees no further testing is needed. She would be a low risk patient for a relatively high risk intra-abdominal surgery. Hopefully with additional weight  loss, her heart rate will also improve as well.   Past Medical History:  Diagnosis Date  . Allergy   . Asthma    History of Asthma  . Bipolar disorder ([place])   . Chest pain, atypical 11/30/2012  . Depression   . H/O degenerative disc disease    L4-L5, L5-S1  . Hyperglycemia    Postoperative hyperglycemia  . Hypertension   . Obesity 07/14/2012  . OSA (obstructive sleep apnea)    mild  . Polycystic disease, ovaries   . Postoperative anemia   . Reflux   . Sinus tachycardia 07/14/2012    Past Surgical History:  Procedure Laterality Date  . NASAL SINUS SURGERY  2004  . OVARIAN CYST REMOVAL  1998   A cyst on the fallopian tube removed  . SPINAL FUSION    . TONSILLECTOMY  1996    Current Medications: Outpatient Medications Prior to Visit  Medication Sig Dispense Refill  . albuterol (PROAIR HFA) 108 (90 BASE) MCG/ACT inhaler Inhale 2 puffs into the lungs every 4 (four) hours as needed for wheezing or shortness of breath.    Marland Kitchen albuterol (PROVENTIL) (2.5 MG/3ML) 0.083% nebulizer solution Take 3 mLs (2.5 mg total) by nebulization every 4 (four) hours as needed for wheezing or shortness of breath. 75 mL 1  . ALPRAZolam (XANAX) 1 MG tablet Take 1 mg by mouth.    . AMITIZA 24 MCG capsule     . budesonide-formoterol (SYMBICORT) 160-4.5 MCG/ACT inhaler Inhale 2 puffs into the lungs 2 (two) times daily. 3 Inhaler 4  .  buPROPion (WELLBUTRIN XL) 150 MG 24 hr tablet     . buPROPion (WELLBUTRIN XL) 300 MG 24 hr tablet     . clonazePAM (KLONOPIN) 1 MG tablet Take 2 mg by mouth at bedtime.  5  . cyclobenzaprine (FLEXERIL) 10 MG tablet     . diclofenac sodium (VOLTAREN) 1 % GEL Apply 4 g topically 4 (four) times daily. To affected joint. 100 g 11  . escitalopram (LEXAPRO) 10 MG tablet     . fentaNYL (DURAGESIC - DOSED MCG/HR) 50 MCG/HR     . fluticasone (FLONASE) 50 MCG/ACT nasal spray Place 1 spray into the nose daily.    . hydrochlorothiazide (MICROZIDE) 12.5 MG capsule Take 1 capsule (12.5 mg  total) by mouth daily. 90 capsule 3  . HYDROmorphone (DILAUDID) 2 MG tablet Take 2 mg by mouth every 6 (six) hours as needed for severe pain.    Marland Kitchen levonorgestrel-ethinyl estradiol (SEASONALE,INTROVALE,JOLESSA) 0.15-0.03 MG tablet TAKE 1 TABLET BY MOUTH DAILY.    . metFORMIN (GLUCOPHAGE) 1000 MG tablet Take 1,000 mg by mouth 2 (two) times daily with a meal.    . nortriptyline (PAMELOR) 75 MG capsule Take 75 mg by mouth.    Marland Kitchen omeprazole (PRILOSEC) 20 MG capsule Take 1 capsule (20 mg total) by mouth daily. 90 capsule 3  . perphenazine (TRILAFON) 4 MG tablet     . traZODone (DESYREL) 100 MG tablet Take 300 mg by mouth.    . valACYclovir (VALTREX) 1000 MG tablet Take 1 tablet (1,000 mg total) by mouth 2 (two) times daily. 4 tablet 12   Facility-Administered Medications Prior to Visit  Medication Dose Route Frequency Provider Last Rate Last Dose  . methylPREDNISolone acetate (DEPO-MEDROL) injection 80 mg  80 mg Intramuscular Once Bobbitt, Sed[place] Muta, MD         Allergies:   Gabapentin; Pineapple; Shellfish allergy; Shellfish-derived products; Erythromycin; Latex; Pregabalin; Pregabalin; Duloxetine; Erythromycin base; Neuromuscular blocking agents; and Pollen extract   Social History   Social History  . Marital status: Divorced    Spouse name: N/A  . Number of children: 0  . Years of education: N/A   Occupational History  . disabled Unemployed   Social History Main Topics  . Smoking status: Never Smoker  . Smokeless tobacco: Never Used  . Alcohol use No  . Drug use: No  . Sexual activity: Yes     Comment: Husband had vasectomy. Sex is painful   Other Topics Concern  . None   Social History Narrative  . None     Family History:  The patient's family history includes Cancer in her father, maternal grandmother, and paternal grandmother.   ROS:   Please see the history of present illness.    ROS All other systems reviewed and are negative.   PHYSICAL EXAM:   VS:  BP 134/84    Pulse (!) 109   Ht 5\' 6"  (1.676 m)   Wt 237 lb (107.5 kg)   BMI 38.25 kg/m    GEN: Well nourished, well developed, in no acute distress  HEENT: normal  Neck: no JVD, carotid bruits, or masses Cardiac: RRR; no murmurs, rubs, or gallops,no edema  Respiratory:  clear to auscultation bilaterally, normal work of breathing GI: soft, nontender, nondistended, + BS MS: no deformity or atrophy  Skin: warm and dry, no rash Neuro:  Alert and Oriented x 3, Strength and sensation are intact Psych: euthymic mood, full affect  Wt Readings from Last 3 Encounters:  03/04/17 237 lb (  107.5 kg)  02/18/17 239 lb 14.4 oz (108.8 kg)  02/17/17 240 lb (108.9 kg)      Studies/Labs Reviewed:   EKG:  EKG is ordered today.  The ekg ordered today demonstrates Mild sinus tachycardia with poor progression anteriorly  Recent Labs: 03/13/2016: TSH 1.32   Lipid Panel    Component Value Date/Time   CHOL 208 (H) 03/03/2016 1215   TRIG 103 03/03/2016 1215   HDL 50 03/03/2016 1215   CHOLHDL 4.2 03/03/2016 1215   VLDL 21 03/03/2016 1215   LDLCALC 137 (H) 03/03/2016 1215    Additional studies/ records that were reviewed today include:   Echo 07/22/2012 LV EF: 50% -  55%  Study Conclusions  - Left ventricle: The cavity size was normal. Wall thickness was normal. Systolic function was normal. The estimated ejection fraction was in the range of 50% to 55%. Wall motion was normal; there were no regional wall motion abnormalities. - Left atrium: The atrium was mildly dilated.    Myoview 11/22/2015 Study Highlights    Nuclear stress EF: 66%. Normal LV function  There was no ST segment deviation noted during stress.  Defect 1: There is a medium defect present in the basal anteroseptal, basal inferoseptal, mid anteroseptal, mid inferoseptal and apex location. This is most consistent with breast artifact and apical thinning .  This is a low risk study.      ASSESSMENT:    1.  Preoperative clearance   2. Hypertension, unspecified type   3. Morbid obesity ([place])   4. Hyperlipidemia, unspecified hyperlipidemia type   5. Sinus tachycardia      PLAN:  In order of problems listed above:  1. Preoperative clearance: She is able to complete at least 4 METS without any exertional symptoms. She is cleared to proceed with surgery without any additional workup. She is a low risk patient for high risk intra-abdominal surgery. The case has been discussed with DOD Dr. Ellyn Hack who also agrees.  2. Sinus tachycardia: She has long-standing history of sinus tachycardia previously controlled on beta blocker. Her beta blocker has been discontinued in the last 2 years. Hopefully her tachycardia will improve after weight loss.  3. Hypertension: Blood pressure stable  4. Hyperlipidemia: Will defer to primary care provide  5. Morbid obesity: Per patient and she will undergo gastric sleeve surgery by Dr. Redmond Pulling.    Medication Adjustments/Labs and Tests Ordered: Current medicines are reviewed at length with the patient today.  Concerns regarding medicines are outlined above.  Medication changes, Labs and Tests ordered today are listed in the Patient Instructions below. Patient Instructions  Medication Instructions:   No changes to current medications.  Labwork:   none  Testing/Procedures:  none  Follow-Up:  1 year with Dr. Martinique  If you need a refill on your cardiac medications before your next appointment, please call your pharmacy.      Hilbert Corrigan, [place]  03/05/2017 10:39 PM    [facility] [address], [place], [place]  [address] Phone: [phone]; Fax: [phone]

## 2017-03-05 ENCOUNTER — Encounter: Payer: Self-pay | Admitting: Physician Assistant

## 2017-03-11 ENCOUNTER — Encounter: Payer: Self-pay | Admitting: Hematology and Oncology

## 2017-03-18 ENCOUNTER — Ambulatory Visit (HOSPITAL_BASED_OUTPATIENT_CLINIC_OR_DEPARTMENT_OTHER): Payer: Medicare Other

## 2017-03-18 ENCOUNTER — Encounter: Payer: Self-pay | Admitting: Hematology and Oncology

## 2017-03-18 ENCOUNTER — Ambulatory Visit (HOSPITAL_BASED_OUTPATIENT_CLINIC_OR_DEPARTMENT_OTHER): Payer: Medicare Other | Admitting: Hematology and Oncology

## 2017-03-18 ENCOUNTER — Telehealth: Payer: Self-pay | Admitting: Hematology and Oncology

## 2017-03-18 VITALS — BP 146/83 | HR 93 | Temp 98.7°F | Resp 18 | Ht 66.0 in | Wt 238.5 lb

## 2017-03-18 DIAGNOSIS — Z8249 Family history of ischemic heart disease and other diseases of the circulatory system: Secondary | ICD-10-CM

## 2017-03-18 DIAGNOSIS — Z832 Family history of diseases of the blood and blood-forming organs and certain disorders involving the immune mechanism: Secondary | ICD-10-CM

## 2017-03-18 DIAGNOSIS — M7989 Other specified soft tissue disorders: Secondary | ICD-10-CM

## 2017-03-18 LAB — PROTIME-INR
INR: 1 — AB (ref 2.00–3.50)
PROTIME: 12 s (ref 10.6–13.4)

## 2017-03-18 NOTE — Telephone Encounter (Signed)
Scheduled appt per 9/12 los - Gave patient AVS and calender per los. - Doppler scheduled with Butch Penny -

## 2017-03-19 LAB — PROTEIN C ACTIVITY: Protein C-Functional: 143 % (ref 73–180)

## 2017-03-19 LAB — PROTEIN S, TOTAL: Protein S, Total: 114 % (ref 60–150)

## 2017-03-19 LAB — LUPUS ANTICOAGULANT PANEL
DRVVT: 38.4 s (ref 0.0–47.0)
PTT-LA: 37.7 s (ref 0.0–51.9)

## 2017-03-19 LAB — PROTEIN S ACTIVITY: Protein S-Functional: 80 % (ref 63–140)

## 2017-03-19 LAB — APTT: APTT: 30 s (ref 24–33)

## 2017-03-19 LAB — ANTITHROMBIN III: ANTITHROMBIN ACTIVITY: 108 % (ref 75–135)

## 2017-03-20 DIAGNOSIS — Z8249 Family history of ischemic heart disease and other diseases of the circulatory system: Secondary | ICD-10-CM | POA: Insufficient documentation

## 2017-03-20 DIAGNOSIS — M7989 Other specified soft tissue disorders: Secondary | ICD-10-CM | POA: Insufficient documentation

## 2017-03-20 LAB — CARDIOLIPIN ANTIBODIES, IGG, IGM, IGA: Anticardiolipin Ab,IgG,Qn: <9 GPL U/mL (ref 0–14)

## 2017-03-20 NOTE — Assessment & Plan Note (Signed)
Patient has developed intermittent swelling after arthroscopy on the right knee. Differential includes venous insufficiency, mild thrombosis, versus idiopathic swelling  Plan: --Right lower extremity venous Doppler

## 2017-03-20 NOTE — Assessment & Plan Note (Addendum)
51 year old female with history of severe thrombophilia/thromboembolic disease in her father, but without any other family history or personal history of confirmed thrombosis. Evaluation requested in anticipation of undergoing bariatric surgery.  The patient is in an interesting situation where we do not have any set science to guide the current workup. Her family history is certainly concerning, but personal history is very benign with exception of history of intermittent right lower extremity swelling that is somewhat concerning as it has developed after a knee procedure. I think it is reasonable to obtain thrombophilia panel to assess for possible inherited thrombophilia as it may have significant impact on conduct of the surgery.  Due to significant concern for possible thrombosis after undergoing abdominal surgery, I do recommend prophylactic anticoagulation following her abdominal procedure. Initiation of anticoagulation will be left to discretion of the operating surgeon who is the better atuned to the postoperative risk of bleeding than I am. Once deemed safe, I do recommend patient to be started on Rivaroxaban (Xarelto) 10 mg daily for 3 months following the surgery. I do plan on seeing patient prior to the time of discontinuation of anticoagulant to review the findings on our testing and to additional support.  Recommendations: --Thrombophilia panel testing --Rivaroxaban (Xarelto) 10mg  PO QDay to start following the abdominal surgery at discretion of the primary treating surgeon and to continue for 3 months after the surgery date

## 2017-03-20 NOTE — Progress Notes (Signed)
[name] New Visit:  Assessment: Family history of DVT 51 year old female with history of severe thrombophilia/thromboembolic disease in her father, but without any other family history or personal history of confirmed thrombosis. Evaluation requested in anticipation of undergoing bariatric surgery.  The patient is in an interesting situation where we do not have any set science to guide the current workup. Her family history is certainly concerning, but personal history is very benign with exception of history of intermittent right lower extremity swelling that is somewhat concerning as it has developed after a knee procedure. I think it is reasonable to obtain thrombophilia panel to assess for possible inherited thrombophilia as it may have significant impact on conduct of the surgery.  Due to significant concern for possible thrombosis after undergoing abdominal surgery, I do recommend prophylactic anticoagulation following her abdominal procedure. Initiation of anticoagulation will be left to discretion of the operating surgeon who is the better atuned to the postoperative risk of bleeding than I am. Once deemed safe, I do recommend patient to be started on Rivaroxaban (Xarelto) 10 mg daily for 3 months following the surgery. I do plan on seeing patient prior to the time of discontinuation of anticoagulant to review the findings on our testing and to additional support.  Recommendations: --Thrombophilia panel testing --Rivaroxaban (Xarelto) 10mg  PO QDay to start following the abdominal surgery at discretion of the primary treating surgeon and to continue for 3 months after the surgery date     Right leg swelling Patient has developed intermittent swelling after arthroscopy on the right knee. Differential includes venous insufficiency, mild thrombosis, versus idiopathic swelling  Plan: --Right lower extremity venous Doppler  --Return to clinic 2 weeks after surgery  which is currently scheduled on September 24.  Voice recognition software was used and creation of this note. Despite my best effort at editing the text, some misspelling/errors may have occurred. Orders Placed This Encounter  Procedures  . Hypercoagulable panel, (components include items with*)    Standing Status:   Future    Number of Occurrences:   1    Standing Expiration Date:   03/18/2018  . Beta-2-glycoprotein i abs, IgG/M/A    Standing Status:   Future    Number of Occurrences:   1    Standing Expiration Date:   03/18/2018  . APTT    Standing Status:   Future    Number of Occurrences:   1    Standing Expiration Date:   03/18/2018  . Protime-INR    Standing Status:   Future    Number of Occurrences:   1    Standing Expiration Date:   03/18/2018    All questions were answered.  . The patient knows to call the clinic with any problems, questions or concerns.  This note was electronically signed.    History of Presenting Illness [name] is a  51 y.o. female referred to the [facility] Due to family history of recurrent venous thromboembolism. Patient has no personal history of previous deep vein thrombosis. She is being considered to undergo weight loss surgery and her surgeon,Dr Lurline Del requested our opinion regarding the need for prophylactic anticoagulation following his surgery. Patient's family history significant for father who has had multiple events including deep vein thrombosis, strokes with at least in the events reported by the family. Patient has only 1 sibling who is her half-sister with the same father who has had no personal thrombotic events either. Patient knows of no  thrombotic history and the grandparents. Patient does have episodic pain and swelling in the right lower extremity that has occurred since the patient had surgery on the right knee. Otherwise, she denies any swelling in the legs, chest pain, cough, hemoptysis. She denies any new  neurological, respiratory, gastrointestinal, or genitourinary symptoms.  Medical History: Past Medical History:  Diagnosis Date  . Allergy   . Asthma    History of Asthma  . Bipolar disorder ([place])   . Chest pain, atypical 11/30/2012  . Depression   . H/O degenerative disc disease    L4-L5, L5-S1  . Hyperglycemia    Postoperative hyperglycemia  . Hypertension   . Obesity 07/14/2012  . OSA (obstructive sleep apnea)    mild  . Polycystic disease, ovaries   . Postoperative anemia    2018 - while dieting   . Reflux   . Sinus tachycardia 07/14/2012   Pt says HR > 100 is consistent    Surgical History: Past Surgical History:  Procedure Laterality Date  . KNEE SURGERY Bilateral 2007  . NASAL SINUS SURGERY  2004  . OVARIAN CYST REMOVAL  1998   A cyst on the fallopian tube removed  . SPINAL FUSION    . TONSILLECTOMY  1996    Family History: Family History  Problem Relation Age of Onset  . Cancer Father        Leukemia  . Diabetes Father   . Diabetes Mother   . Cancer Maternal Grandmother        Stomach  . Cancer Paternal Grandmother   . Cancer Paternal Aunt   . Diabetes Paternal Aunt   . Cancer Paternal Uncle   . Cancer Paternal Aunt     Social History: Social History   Social History  . Marital status: Divorced    Spouse name: N/A  . Number of children: 0  . Years of education: N/A   Occupational History  . disabled Unemployed   Social History Main Topics  . Smoking status: Never Smoker  . Smokeless tobacco: Never Used  . Alcohol use No  . Drug use: No  . Sexual activity: No   Other Topics Concern  . Not on file   Social History Narrative  . No narrative on file    Allergies: Allergies  Allergen Reactions  . Gabapentin Nausea And Vomiting and Anaphylaxis  . Pineapple Shortness Of Breath  . Shellfish Allergy Anaphylaxis  . Shellfish-Derived Products Anaphylaxis  . Erythromycin Swelling  . Latex Rash    Hives  . Pregabalin Swelling  .  Pregabalin Swelling  . Duloxetine Itching and Other (See Comments)    Unknown reaction Unknown reaction  . Erythromycin Base Swelling  . Neuromuscular Blocking Agents   . Pollen Extract Other (See Comments)    Stuffy  Nose    Medications:  Current Outpatient Prescriptions  Medication Sig Dispense Refill  . albuterol (PROAIR HFA) 108 (90 BASE) MCG/ACT inhaler Inhale 2 puffs into the lungs every 4 (four) hours as needed for wheezing or shortness of breath.    Marland Kitchen albuterol (PROVENTIL) (2.5 MG/3ML) 0.083% nebulizer solution Take 3 mLs (2.5 mg total) by nebulization every 4 (four) hours as needed for wheezing or shortness of breath. 75 mL 1  . ALPRAZolam (XANAX) 1 MG tablet Take 1 mg by mouth.    . AMITIZA 24 MCG capsule     . budesonide-formoterol (SYMBICORT) 160-4.5 MCG/ACT inhaler Inhale 2 puffs into the lungs 2 (two) times daily. 3 Inhaler 4  .  buPROPion (WELLBUTRIN XL) 150 MG 24 hr tablet     . buPROPion (WELLBUTRIN XL) 300 MG 24 hr tablet     . clonazePAM (KLONOPIN) 1 MG tablet Take 2 mg by mouth at bedtime.  5  . cyclobenzaprine (FLEXERIL) 10 MG tablet     . diclofenac sodium (VOLTAREN) 1 % GEL Apply 4 g topically 4 (four) times daily. To affected joint. 100 g 11  . escitalopram (LEXAPRO) 10 MG tablet     . fentaNYL (DURAGESIC - DOSED MCG/HR) 50 MCG/HR     . fluticasone (FLONASE) 50 MCG/ACT nasal spray Place 1 spray into the nose daily.    . hydrochlorothiazide (MICROZIDE) 12.5 MG capsule Take 1 capsule (12.5 mg total) by mouth daily. 90 capsule 3  . HYDROmorphone (DILAUDID) 2 MG tablet Take 2 mg by mouth every 6 (six) hours as needed for severe pain.    Marland Kitchen levonorgestrel-ethinyl estradiol (SEASONALE,INTROVALE,JOLESSA) 0.15-0.03 MG tablet TAKE 1 TABLET BY MOUTH DAILY.    . metFORMIN (GLUCOPHAGE) 1000 MG tablet Take 1,000 mg by mouth 2 (two) times daily with a meal.    . nortriptyline (PAMELOR) 75 MG capsule Take 75 mg by mouth.    Marland Kitchen omeprazole (PRILOSEC) 20 MG capsule Take 1 capsule  (20 mg total) by mouth daily. 90 capsule 3  . perphenazine (TRILAFON) 4 MG tablet     . traZODone (DESYREL) 100 MG tablet Take 300 mg by mouth.    . valACYclovir (VALTREX) 1000 MG tablet Take 1 tablet (1,000 mg total) by mouth 2 (two) times daily. 4 tablet 12   Current Facility-Administered Medications  Medication Dose Route Frequency Provider Last Rate Last Dose  . methylPREDNISolone acetate (DEPO-MEDROL) injection 80 mg  80 mg Intramuscular Once Bobbitt, [place] Muta, MD        Review of Systems: Review of Systems  All other systems reviewed and are negative.    PHYSICAL EXAMINATION Blood pressure (!) 146/83, pulse 93, temperature 98.7 F (37.1 C), temperature source Oral, resp. rate 18, height 5\' 6"  (1.676 m), weight 238 lb 8 oz (108.2 kg), SpO2 96 %.  ECOG PERFORMANCE STATUS: 0 - Asymptomatic  Physical Exam  Constitutional: She is oriented to person, place, and time and well-developed, well-nourished, and in no distress.  HENT:  Head: Normocephalic and atraumatic.  Mouth/Throat: Oropharynx is clear and moist. No oropharyngeal exudate.  Eyes: Pupils are equal, round, and reactive to light. Conjunctivae and EOM are normal. No scleral icterus.  Neck: Normal range of motion. Neck supple. No thyromegaly present.  Cardiovascular: Normal rate, regular rhythm and intact distal pulses.   No murmur heard. Pulmonary/Chest: Breath sounds normal. No respiratory distress. She has no wheezes.  Abdominal: Soft. Bowel sounds are normal. She exhibits no distension and no mass. There is no tenderness. There is no rebound.  Musculoskeletal: Normal range of motion. She exhibits no edema or tenderness.  Lymphadenopathy:    She has no cervical adenopathy.  Neurological: She is alert and oriented to person, place, and time. She has normal reflexes. No cranial nerve deficit. GCS score is 15.  Skin: Skin is warm and dry. No rash noted. She is not diaphoretic. No erythema.     LABORATORY DATA: I  have personally reviewed the data as listed: Appointment on 03/18/2017  Component Date Value Ref Range Status  . aPTT 03/18/2017 30  24 - 33 sec Final   Comment: This test has not been validated for monitoring unfractionated heparin therapy. aPTT-based therapeutic ranges for unfractionated heparin therapy  have not been established. For general guidelines on Heparin monitoring, refer to the [employer].   . Protime 03/18/2017 12.0  10.6 - 13.4 Seconds Final  . INR 03/18/2017 1.00* 2.00 - 3.50 Final   Comment: INR is useful only to assess adequacy of anticoagulation with coumadin when comparing results from different labs. It should not be used to estimate bleeding risk or presence/abscense of coagulopathy in patients not on coumadin. Expected INR ranges for  nontherapeutic patients is 0.88 - 1.12.   Marland Kitchen Lovenox 03/18/2017 No   Final  . Antithrombin Activity 03/18/2017 108  75 - 135 % Final   Comment: Direct Xa inhibitor anticoagulants such as rivaroxaban, apixaban and edoxaban will lead to spuriously elevated antithrombin activity levels possibly masking a deficiency.   Marland Kitchen PTT-LA 03/18/2017 37.7  0.0 - 51.9 sec Final  . dRVVT 03/18/2017 38.4  0.0 - 47.0 sec Final  . Lupus Reflex Interpretation 03/18/2017 Comment:   Final   No lupus anticoagulant was detected.  . Protein C-Functional 03/18/2017 143  73 - 180 % Final  . Protein S-Functional 03/18/2017 80  63 - 140 % Final   Comment: Protein S activity may be falsely increased (masking an abnormal, low result) in patients receiving direct Xa inhibitor (e.g., rivaroxaban, apixaban, edoxaban) or a direct thrombin inhibitor (e.g., dabigatran) anticoagulant treatment due to assay interference by these drugs.   . Protein S, Total 03/18/2017 114  60 - 150 % Final   Comment: This test was developed and its performance characteristics determined by [facility]. It has not been cleared or approved by the Food and Drug  Administration.         Ardath Sax, MD

## 2017-03-23 LAB — FACTOR 5 LEIDEN

## 2017-03-23 LAB — PROTEIN C, TOTAL: Protein C Antigen: 108 % (ref 60–150)

## 2017-03-23 LAB — BETA-2-GLYCOPROTEIN I ABS, IGG/M/A
Beta-2 Glyco 1 IgA: <9 GPI IgA units (ref 0–25)
Beta-2 Glycoprotein I Ab, IgG: <9 GPI IgG units (ref 0–20)

## 2017-03-23 LAB — PROTHROMBIN GENE MUTATION

## 2017-03-23 NOTE — Patient Instructions (Addendum)
TERE MCCONAUGHEY  03/23/2017   Your procedure is scheduled on: 9/24/ 18  Report to [facility] Main  Entrance Take [place]  elevators to 3rd floor to  [place] at      0900 AM.    Call this number if you have problems the morning of surgery [phone]   Remember: ONLY 1 PERSON MAY GO WITH YOU TO SHORT STAY TO GET  READY MORNING OF [place].  Do not eat food or drink liquids :After Midnight.     Take these medicines the morning of surgery with A SIP OF WATER:  Valtrex, omeprazole, Flonase, lexapro , Wellbutrin, inhalers and bring              DO NOT TAKE ANY DIABETIC MEDICATIONS THE DAY OF YOUR SURGERY                               You may not have any metal on your body including hair pins and              piercings  Do not wear jewelry, make-up, lotions, powders or perfumes, deodorant             Do not wear nail polish.  Do not shave  48 hours prior to surgery.     Do not bring valuables to the hospital. [place].  Contacts, dentures or bridgework may not be worn into surgery.  Leave suitcase in the car. After surgery it may be brought to your room.                 Please read over the following fact sheets you were given: _____________________________________________________________________           [facility] - Preparing for Surgery Before surgery, you can play an important role.  Because skin is not sterile, your skin needs to be as free of germs as possible.  You can reduce the number of germs on your skin by washing with CHG (chlorahexidine gluconate) soap before surgery.  CHG is an antiseptic cleaner which kills germs and bonds with the skin to continue killing germs even after washing. Please DO NOT use if you have an allergy to CHG or antibacterial soaps.  If your skin becomes reddened/irritated stop using the CHG and inform your nurse when you arrive at Short Stay. Do not shave  (including legs and underarms) for at least 48 hours prior to the first CHG shower.  You may shave your face/neck. Please follow these instructions carefully:  1.  Shower with CHG Soap the night before surgery and the  morning of Surgery.  2.  If you choose to wash your hair, wash your hair first as usual with your  normal  shampoo.  3.  After you shampoo, rinse your hair and body thoroughly to remove the  shampoo.                           4.  Use CHG as you would any other liquid soap.  You can apply chg directly  to the skin and wash  Gently with a scrungie or clean washcloth.  5.  Apply the CHG Soap to your body ONLY FROM THE NECK DOWN.   Do not use on face/ open                           Wound or open sores. Avoid contact with eyes, ears mouth and genitals (private parts).                       Wash face,  Genitals (private parts) with your normal soap.             6.  Wash thoroughly, paying special attention to the area where your surgery  will be performed.  7.  Thoroughly rinse your body with warm water from the neck down.  8.  DO NOT shower/wash with your normal soap after using and rinsing off  the CHG Soap.                9.  Pat yourself dry with a clean towel.            10.  Wear clean pajamas.            11.  Place clean sheets on your bed the night of your first shower and do not  sleep with pets. Day of Surgery : Do not apply any lotions/deodorants the morning of surgery.  Please wear clean clothes to the hospital/surgery center.  FAILURE TO FOLLOW THESE INSTRUCTIONS MAY RESULT IN THE CANCELLATION OF YOUR SURGERY PATIENT SIGNATURE_________________________________  NURSE SIGNATURE__________________________________  ________________________________________________________________________

## 2017-03-24 NOTE — Progress Notes (Signed)
EKG 03/04/17 epic Cardiac clearance 03/04/17 epic Stress test 11/22/15 epic CXR 09/19/16 epic

## 2017-03-24 NOTE — Progress Notes (Signed)
Need orders in epic.  preop on 03/26/17.

## 2017-03-25 ENCOUNTER — Ambulatory Visit (HOSPITAL_COMMUNITY)
Admission: RE | Admit: 2017-03-25 | Discharge: 2017-03-25 | Disposition: A | Discharge status: Home / Self Care | Payer: Medicare Other | Source: Ambulatory Visit | Attending: Hematology and Oncology | Admitting: Hematology and Oncology

## 2017-03-25 DIAGNOSIS — M7989 Other specified soft tissue disorders: Secondary | ICD-10-CM

## 2017-03-25 NOTE — Progress Notes (Signed)
VASCULAR LAB PRELIMINARY  PRELIMINARY  PRELIMINARY  PRELIMINARY  Right lower extremity venous duplex completed.    Preliminary report:  Right:  No evidence of DVT, superficial thrombosis, or Baker's cyst.  Heron Pitcock, RVT 03/25/2017, 9:44 AM

## 2017-03-26 ENCOUNTER — Encounter (HOSPITAL_COMMUNITY)
Admission: RE | Admit: 2017-03-26 | Discharge: 2017-03-26 | Disposition: A | Discharge status: Home / Self Care | Payer: Medicare Other | Source: Ambulatory Visit | Attending: General Surgery | Admitting: General Surgery

## 2017-03-26 ENCOUNTER — Encounter (HOSPITAL_COMMUNITY): Payer: Self-pay

## 2017-03-26 DIAGNOSIS — Z01812 Encounter for preprocedural laboratory examination: Secondary | ICD-10-CM | POA: Diagnosis not present

## 2017-03-26 HISTORY — DX: Myoneural disorder, unspecified: G70.9

## 2017-03-26 HISTORY — DX: Type 2 diabetes mellitus without complications: E11.9

## 2017-03-26 HISTORY — DX: Pneumonia, unspecified organism: J18.9

## 2017-03-26 HISTORY — DX: Unspecified osteoarthritis, unspecified site: M19.90

## 2017-03-26 HISTORY — DX: Anxiety disorder, unspecified: F41.9

## 2017-03-26 HISTORY — DX: Gastro-esophageal reflux disease without esophagitis: K21.9

## 2017-03-26 LAB — BASIC METABOLIC PANEL
Anion gap: 8 (ref 5–15)
BUN: 23 mg/dL — AB (ref 6–20)
CO2: 29 mmol/L (ref 22–32)
CREATININE: 0.88 mg/dL (ref 0.44–1.00)
Calcium: 10.1 mg/dL (ref 8.9–10.3)
Chloride: 101 mmol/L (ref 101–111)
GFR calc Af Amer: >60 mL/min (ref >60)
GFR calc non Af Amer: >60 mL/min (ref >60)
GLUCOSE: 99 mg/dL (ref 65–99)
Potassium: 3.7 mmol/L (ref 3.5–5.1)
Sodium: 138 mmol/L (ref 135–145)

## 2017-03-26 LAB — CBC
HCT: 40.8 % (ref 36.0–46.0)
Hemoglobin: 13.5 g/dL (ref 12.0–15.0)
MCH: 26.5 pg (ref 26.0–34.0)
MCHC: 33.1 g/dL (ref 30.0–36.0)
MCV: 80.2 fL (ref 78.0–100.0)
PLATELETS: 307 10*3/uL (ref 150–400)
RBC: 5.09 MIL/uL (ref 3.87–5.11)
RDW: 15.1 % (ref 11.5–15.5)
WBC: 11.9 10*3/uL — ABNORMAL HIGH (ref 4.0–10.5)

## 2017-03-26 LAB — HEMOGLOBIN A1C
Hgb A1c MFr Bld: 5.7 % — ABNORMAL HIGH (ref 4.8–5.6)
Mean Plasma Glucose: 116.89 mg/dL

## 2017-03-26 LAB — GLUCOSE, CAPILLARY: Glucose-Capillary: 91 mg/dL (ref 65–99)

## 2017-03-26 LAB — HCG, SERUM, QUALITATIVE: PREG SERUM: NEGATIVE

## 2017-03-26 NOTE — Progress Notes (Signed)
Please place orders in epic pt. Has preop appt today at 1:00 pm . Thank you!

## 2017-03-27 ENCOUNTER — Ambulatory Visit: Payer: Self-pay | Admitting: General Surgery

## 2017-03-27 NOTE — H&P (Signed)
[name] 03/27/2017 8:40 AM Location: [facility] Patient #: [id number] DOB: Jul 05, 1966 Single / Language: [name] / Race: White Female  History of Present Illness [name] M. Manasvini Whatley MD; 03/27/2017 10:48 AM) The patient is a 51 year old female who presents for a bariatric surgery evaluation. She comes in today for her preoperative appointment. She denies any significant medical changes since she was initially seen a few months ago. She has lost weight as part of her preop diet plan. Her upper GI was unremarkable. Her chest x-ray was normal. She did see cardiology around the end of August and was found to be low risk for surgery. She also saw hematology because of her family history specifically her father's history of blood clots. They recommended 3 months of anticoagulation after surgery. She is accompanied by her husband today. She denies any chest pain, chest pressure, source of breath, orthopnea, paroxysmal nocturnal dyspnea today. She denies any melena or hematochezia. She is still on chronic pain medication and is managed by [facility]. She continues to have musculoskeletal pain. Her bariatric evaluation labs were unremarkable. She did have a hemoglobin A1c which was 5.7.  Review of systems-a comprehensive 12 point review systems was performed and all systems are negative except for what is mentioned above   08/28/16 She is referred by Dr [name] for evaluation of weight loss surgery. She participated in our online seminar. She is interested in sleeve gastrectomy. She would like to lose weight as well as to lessen her chronic pain. She has struggled with her weight since high school. Despite numerous attempts for sustained weight loss she's been unsuccessful. She has tried [employer], [employer] on several occasions and Phen-Fen before it was pulled from the market. Her comorbidities include hypertension, prediabetes, chronic back pain, chronic  knee pain.  She endorses shortness of breath, dyspnea on exertion, two-pillow orthopnea, paroxysmal nocturnal dyspnea. She reports no personal history of blood clots however she does have a family history of blood clots. She reports that her father has had multiple blood clots. She is initially by her mother. They both state that they did not occur after a traumatic fall or prolonged hospitalization. To their knowledge he has not had a genetic workup regarding his tendency to have blood clots. The wife states that he has had at least 10 blood clots and is on chronic anticoagulation. She reportedly had a sleep study in 2013. I don't have the actual records that this comes from an office visit note from [facility] "Mild Obstructive sleep apnea (AHI 7, REM AHI 22, CPAP 10) (02/04/2012)". It appears that she underwent a repeat sleep study in 2015 at [facility] but I don't have a copy of those records.  She has some occasional sensation of reflux. She has no difficulty eating or drinking solids with the exception of sometimes states feels like he gets stuck. She has constipation issues and averages a bowel movement every 2 days. She is on Colace, senna and amitiza. She has chronic back pain as well as right knee pain. Her back pain is in the center back that radiates to bilateral hips with some right lower leg sciatica. She has had one spinal fusion as well as 2 foot surgeries. She states that she is prediabetic.  She denies any tobacco or drugs or alcohol. She is on disability.  She is on chronic pain medication consisting of fentanyl and dilaudid. She has a pain contract with [facility].  Problem List/Past Medical [name] M. 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 Redmond Pulling, MD; 03/27/2017 10:51 AM) Foot Surgery Bilateral. Knee Surgery Bilateral. Oral Surgery Spinal Surgery - Lower Back Tonsillectomy  Diagnostic Studies History [name] M. Redmond Pulling, MD; 03/27/2017 10:51 AM) Colonoscopy 1-5 years ago Mammogram 1-3 years ago Pap Smear 1-5 years ago  Allergies Malachy Moan, RMA; 03/27/2017 8:40 AM) Gabapentin *ANTICONVULSANTS* Nausea, Vomiting, Anaphylaxis. Pineapple Concentrate *PHARMACEUTICAL ADJUVANTS* Shortness of breath. SHELLFISH Anaphylaxis. Erythromycin *DERMATOLOGICALS* Swelling. Latex Exam Gloves *MEDICAL DEVICES AND SUPPLIES* Pregabalin *CHEMICALS* Swelling. DULoxetine HCl *ANTIDEPRESSANTS* Itching. Erythromycin Base *MACROLIDES* Swelling. Pollen Extracts *ALTERNATIVE MEDICINES*  Medication History [name] M. Redmond Pulling, MD; 03/27/2017 10:51 AM) ALPRAZolam (1MG  Tablet, Oral) Active. Amitiza (24MCG Capsule, Oral) Active. BuPROPion HCl ER (XL) (300MG  Tablet ER 24HR, Oral) Active. BuPROPion HCl ER (XL) (150MG  Tablet ER 24HR, Oral) Active. Albuterol Sulfate ((2.5 MG/3ML)0.083% Nebulized Soln, Inhalation) Active. ClonazePAM (1MG  Tablet, Oral) Active. Escitalopram Oxalate (10MG  Tablet, Oral) Active. FentaNYL (50MCG/HR Patch 72HR, Transdermal) Active. HydroCHLOROthiazide (12.5MG  Capsule, Oral) Active. MetFORMIN HCl (1000MG  Tablet, Oral) Active. HYDROmorphone HCl (2MG  Tablet, Oral) Active. Nortriptyline HCl (25MG  Capsule, Oral) Active. TraZODone HCl (100MG  Tablet, Oral) Active. Omeprazole (20MG  Capsule DR, Oral) Active. Cyclobenzaprine HCl (10MG  Tablet, Oral) Active. Fluticasone Propionate (50MCG/ACT Suspension, Nasal) Active. Dulera (200-5MCG/ACT Aerosol, Inhalation) Active. Omeprazole (20MG  Capsule ER, Oral) Active. Perphenazine (4MG  Tablet, Oral) Active. Movantik (25MG  Tablet, Oral) Active. Medications Reconciled Zofran ODT (4MG  Tablet Disint, 1 (one) Tablet  Oral every six hours, as needed, Taken starting 03/27/2017) Active.  Social History [name] M. Redmond Pulling, MD; 03/27/2017 10:51 AM) Alcohol use Occasional alcohol use. Caffeine use Tea. No drug use Tobacco use Never smoker.  Family History [name] M. Redmond Pulling, MD; 03/27/2017 10:51 AM) Alcohol Abuse Family Members In General, Sister. Arthritis Father. Bleeding disorder Father. Breast Cancer Family Members In General. Cancer Father. Depression Mother, Sister. Heart Disease Family Members In General, Father. Heart disease in female family member before age 57 Hypertension Father, Mother, Sister. Migraine Headache Father. Respiratory Condition Family Members In General, Father.  Pregnancy / Birth History [name] M. Redmond Pulling, MD; 03/27/2017 10:51 AM) Age at menarche 83 years. Gravida 1 Irregular periods Maternal age 67-25 Para 0  Other Problems [name] M. Redmond Pulling, MD; 03/27/2017 10:51 AM) Anxiety Disorder Arthritis Asthma DEPRESSION, CONTROLLED (F32.9) CHRONIC BILATERAL LOW BACK PAIN WITH RIGHT-SIDED SCIATICA (M54.41) GASTROESOPHAGEAL REFLUX DISEASE, ESOPHAGITIS PRESENCE NOT SPECIFIED (K21.9) HYPERTENSION, ESSENTIAL (I10) SLEEP APNEA, OBSTRUCTIVE (G47.33)    Vitals Malachy Moan RMA; 03/27/2017 8:41 AM) 03/27/2017 8:40 AM Weight: 232 lb Height: 66in Body Surface Area: 2.13 m Body Mass Index: 37.45 kg/m  Temp.: 98.91F  Pulse: 112 (Regular)  BP: 140/96 (Sitting, Left Arm, Standard)      Physical Exam [name] M. Jaloni Sorber MD; 03/27/2017 10:48 AM)  General Mental Status-Alert. General Appearance-Consistent with stated age. Hydration-Well hydrated. Voice-Normal. Note: morbidly obese  Head and Neck Head-normocephalic, atraumatic with no lesions or palpable masses. Trachea-midline. Thyroid Gland Characteristics - normal size and consistency.  Eye Eyeball - Bilateral-Extraocular movements intact. Sclera/Conjunctiva -  Bilateral-No scleral icterus.  Chest and Lung Exam Chest and lung exam reveals -quiet, even and easy respiratory effort with no use of accessory muscles and on auscultation, normal breath sounds, no adventitious sounds and normal vocal resonance. Inspection Chest Wall - Normal. Back - normal.  Breast - Did not examine.  Cardiovascular Cardiovascular  examination reveals -normal heart sounds, regular rate and rhythm with no murmurs and normal pedal pulses bilaterally.  Abdomen Inspection Inspection of the abdomen reveals - No Hernias. Skin - Scar - no surgical scars. Palpation/Percussion Palpation and Percussion of the abdomen reveal - Soft, Non Tender, No Rebound tenderness, No Rigidity (guarding) and No hepatosplenomegaly. Auscultation Auscultation of the abdomen reveals - Bowel sounds normal.  Peripheral Vascular Upper Extremity Palpation - Pulses bilaterally normal.  Neurologic Neurologic evaluation reveals -alert and oriented x 3 with no impairment of recent or remote memory. Mental Status-Normal.  Neuropsychiatric The patient's mood and affect are described as -normal. Judgment and Insight-insight is appropriate concerning matters relevant to self.  Musculoskeletal Normal Exam - Left-Upper Extremity Strength Normal and Lower Extremity Strength Normal. Normal Exam - Right-Upper Extremity Strength Normal and Lower Extremity Strength Normal.  Lymphatic Head & Neck  General Head & Neck Lymphatics: Bilateral - Description - Normal. Axillary - Did not examine. Femoral & Inguinal - Did not examine.    Assessment & Plan [name] M. Lucan Riner MD; 03/27/2017 10:50 AM)  OBESITY (BMI 30-39.9) (E66.9) Impression: The patient meets weight loss surgery criteria. I think the patient would be an acceptable candidate for Laparoscopic vertical sleeve gastrectomy.  We re discussed LSG ane the operative and postoperative process. We discussed her workup. We discussed the  typical recovery period. We will coordinate with [facility] regarding her postoperative pain medicine prescription. She was given a prescription for nausea medicine for after surgery. I congratulated her on her weight loss so far. She was given preoperative instructions.  Current Plans Pt Education - EMW_preopbariatric Started Zofran ODT 4MG , 1 (one) Tablet every six hours, as needed, #15, 03/27/2017, No Refill. FAMILY HISTORY OF BLOOD CLOTS (Z82.49) Impression: Hematology recommended 3 months of Xarelto at 10 mg per day. I advised the patient that I would initially send her home on once a day Lovenox injections and then switch her to [place], ESOPHAGITIS PRESENCE NOT SPECIFIED (K21.9) Impression: Upper GI unremarkable. Did discuss small increased risk of reflux after sleeve gastrectomy  DEPRESSION, CONTROLLED (F32.9)  CHRONIC BILATERAL LOW BACK PAIN WITH RIGHT-SIDED SCIATICA (M54.41)  HYPERTENSION, ESSENTIAL (I10)  SLEEP APNEA, OBSTRUCTIVE (G47.33)  PREDIABETES (R73.03)  Leighton Ruff. Redmond Pulling, MD, FACS General, Bariatric, & Minimally Invasive Surgery [facility], [place]

## 2017-03-30 ENCOUNTER — Inpatient Hospital Stay (HOSPITAL_COMMUNITY)
Admission: RE | Admit: 2017-03-30 | Discharge: 2017-03-31 | DRG: 983 | Disposition: A | Discharge status: Home / Self Care | Payer: Medicare Other | Source: Ambulatory Visit | Attending: General Surgery | Admitting: General Surgery

## 2017-03-30 ENCOUNTER — Inpatient Hospital Stay (HOSPITAL_COMMUNITY): Payer: Medicare Other

## 2017-03-30 ENCOUNTER — Encounter (HOSPITAL_COMMUNITY)
Admission: RE | Disposition: A | Discharge status: Home / Self Care | Payer: Self-pay | Source: Ambulatory Visit | Attending: General Surgery

## 2017-03-30 ENCOUNTER — Encounter (HOSPITAL_COMMUNITY): Payer: Self-pay | Admitting: Emergency Medicine

## 2017-03-30 DIAGNOSIS — Z79891 Long term (current) use of opiate analgesic: Secondary | ICD-10-CM

## 2017-03-30 DIAGNOSIS — Z888 Allergy status to other drugs, medicaments and biological substances status: Secondary | ICD-10-CM | POA: Diagnosis not present

## 2017-03-30 DIAGNOSIS — F3170 Bipolar disorder, currently in remission, most recent episode unspecified: Secondary | ICD-10-CM | POA: Diagnosis present

## 2017-03-30 DIAGNOSIS — Z981 Arthrodesis status: Secondary | ICD-10-CM

## 2017-03-30 DIAGNOSIS — Z7984 Long term (current) use of oral hypoglycemic drugs: Secondary | ICD-10-CM | POA: Diagnosis not present

## 2017-03-30 DIAGNOSIS — K5900 Constipation, unspecified: Secondary | ICD-10-CM | POA: Diagnosis present

## 2017-03-30 DIAGNOSIS — E785 Hyperlipidemia, unspecified: Secondary | ICD-10-CM | POA: Diagnosis present

## 2017-03-30 DIAGNOSIS — Z91013 Allergy to seafood: Secondary | ICD-10-CM | POA: Diagnosis not present

## 2017-03-30 DIAGNOSIS — F319 Bipolar disorder, unspecified: Secondary | ICD-10-CM | POA: Diagnosis present

## 2017-03-30 DIAGNOSIS — E669 Obesity, unspecified: Secondary | ICD-10-CM | POA: Diagnosis present

## 2017-03-30 DIAGNOSIS — R7303 Prediabetes: Secondary | ICD-10-CM | POA: Diagnosis present

## 2017-03-30 DIAGNOSIS — G4733 Obstructive sleep apnea (adult) (pediatric): Secondary | ICD-10-CM | POA: Diagnosis present

## 2017-03-30 DIAGNOSIS — Z91018 Allergy to other foods: Secondary | ICD-10-CM | POA: Diagnosis not present

## 2017-03-30 DIAGNOSIS — Z79899 Other long term (current) drug therapy: Secondary | ICD-10-CM | POA: Diagnosis not present

## 2017-03-30 DIAGNOSIS — Z6833 Body mass index (BMI) 33.0-33.9, adult: Secondary | ICD-10-CM | POA: Diagnosis present

## 2017-03-30 DIAGNOSIS — Z23 Encounter for immunization: Secondary | ICD-10-CM | POA: Diagnosis present

## 2017-03-30 DIAGNOSIS — Z9229 Personal history of other drug therapy: Secondary | ICD-10-CM

## 2017-03-30 DIAGNOSIS — I10 Essential (primary) hypertension: Secondary | ICD-10-CM | POA: Diagnosis present

## 2017-03-30 DIAGNOSIS — R0609 Other forms of dyspnea: Secondary | ICD-10-CM | POA: Diagnosis present

## 2017-03-30 DIAGNOSIS — G8929 Other chronic pain: Secondary | ICD-10-CM | POA: Diagnosis present

## 2017-03-30 DIAGNOSIS — F419 Anxiety disorder, unspecified: Secondary | ICD-10-CM | POA: Diagnosis present

## 2017-03-30 DIAGNOSIS — K219 Gastro-esophageal reflux disease without esophagitis: Secondary | ICD-10-CM | POA: Diagnosis present

## 2017-03-30 DIAGNOSIS — J45909 Unspecified asthma, uncomplicated: Secondary | ICD-10-CM | POA: Diagnosis present

## 2017-03-30 DIAGNOSIS — M5441 Lumbago with sciatica, right side: Secondary | ICD-10-CM | POA: Diagnosis present

## 2017-03-30 DIAGNOSIS — M1711 Unilateral primary osteoarthritis, right knee: Secondary | ICD-10-CM | POA: Diagnosis present

## 2017-03-30 DIAGNOSIS — Z8249 Family history of ischemic heart disease and other diseases of the circulatory system: Secondary | ICD-10-CM

## 2017-03-30 DIAGNOSIS — Z91040 Latex allergy status: Secondary | ICD-10-CM | POA: Diagnosis not present

## 2017-03-30 DIAGNOSIS — Z9884 Bariatric surgery status: Secondary | ICD-10-CM

## 2017-03-30 DIAGNOSIS — Z6837 Body mass index (BMI) 37.0-37.9, adult: Secondary | ICD-10-CM

## 2017-03-30 HISTORY — PX: LAPAROSCOPIC GASTRIC SLEEVE RESECTION: SHX5895

## 2017-03-30 LAB — HEMOGLOBIN AND HEMATOCRIT, BLOOD
HCT: 39.1 % (ref 36.0–46.0)
HEMOGLOBIN: 12.7 g/dL (ref 12.0–15.0)

## 2017-03-30 LAB — GLUCOSE, CAPILLARY
GLUCOSE-CAPILLARY: 154 mg/dL — AB (ref 65–99)
Glucose-Capillary: 106 mg/dL — ABNORMAL HIGH (ref 65–99)

## 2017-03-30 SURGERY — GASTRECTOMY, SLEEVE, LAPAROSCOPIC
Anesthesia: General

## 2017-03-30 MED ORDER — OXYCODONE HCL 5 MG/5ML PO SOLN
5.0000 mg | ORAL | Status: DC | PRN
  Administered 2017-03-31 (×2): 10 mg via ORAL
  Filled 2017-03-30 (×2): qty 10

## 2017-03-30 MED ORDER — NORTRIPTYLINE HCL 25 MG PO CAPS
150.0000 mg | ORAL_CAPSULE | Freq: Every day | ORAL | Status: DC
  Filled 2017-03-30: qty 6

## 2017-03-30 MED ORDER — APREPITANT 40 MG PO CAPS
40.0000 mg | ORAL_CAPSULE | ORAL | Status: AC
  Administered 2017-03-30: 40 mg via ORAL
  Filled 2017-03-30: qty 1

## 2017-03-30 MED ORDER — FENTANYL CITRATE (PF) 100 MCG/2ML IJ SOLN
INTRAMUSCULAR | Status: AC
  Administered 2017-03-30: 50 ug via INTRAVENOUS
  Filled 2017-03-30: qty 4

## 2017-03-30 MED ORDER — DEXAMETHASONE SODIUM PHOSPHATE 10 MG/ML IJ SOLN
INTRAMUSCULAR | Status: DC | PRN
  Administered 2017-03-30: 10 mg via INTRAVENOUS

## 2017-03-30 MED ORDER — BUPIVACAINE LIPOSOME 1.3 % IJ SUSP
20.0000 mL | Freq: Once | INTRAMUSCULAR | Status: DC
  Filled 2017-03-30: qty 20

## 2017-03-30 MED ORDER — HYDROMORPHONE HCL-NACL 0.5-0.9 MG/ML-% IV SOSY
PREFILLED_SYRINGE | INTRAVENOUS | Status: AC
  Administered 2017-03-30: 0.5 mg via INTRAVENOUS
  Filled 2017-03-30: qty 4

## 2017-03-30 MED ORDER — HYDROMORPHONE HCL-NACL 0.5-0.9 MG/ML-% IV SOSY
PREFILLED_SYRINGE | INTRAVENOUS | Status: AC
  Filled 2017-03-30: qty 2

## 2017-03-30 MED ORDER — PROMETHAZINE HCL 25 MG/ML IJ SOLN
INTRAMUSCULAR | Status: AC
  Administered 2017-03-30: 12.5 mg via INTRAVENOUS
  Filled 2017-03-30: qty 1

## 2017-03-30 MED ORDER — MIDAZOLAM HCL 2 MG/2ML IJ SOLN
INTRAMUSCULAR | Status: AC
  Filled 2017-03-30: qty 2

## 2017-03-30 MED ORDER — ACETAMINOPHEN 500 MG PO TABS
1000.0000 mg | ORAL_TABLET | ORAL | Status: AC
  Administered 2017-03-30: 1000 mg via ORAL
  Filled 2017-03-30: qty 2

## 2017-03-30 MED ORDER — ROCURONIUM BROMIDE 10 MG/ML (PF) SYRINGE
PREFILLED_SYRINGE | INTRAVENOUS | Status: DC | PRN
  Administered 2017-03-30: 20 mg via INTRAVENOUS
  Administered 2017-03-30: 50 mg via INTRAVENOUS
  Administered 2017-03-30: 10 mg via INTRAVENOUS
  Administered 2017-03-30: 5 mg via INTRAVENOUS

## 2017-03-30 MED ORDER — LIDOCAINE 2% (20 MG/ML) 5 ML SYRINGE
INTRAMUSCULAR | Status: DC | PRN
  Administered 2017-03-30: 1.5 mg/kg/h via INTRAVENOUS

## 2017-03-30 MED ORDER — ALBUTEROL SULFATE (2.5 MG/3ML) 0.083% IN NEBU: 2.5000 mg | INHALATION_SOLUTION | RESPIRATORY_TRACT | Status: DC | PRN

## 2017-03-30 MED ORDER — LIDOCAINE 2% (20 MG/ML) 5 ML SYRINGE
INTRAMUSCULAR | Status: AC
  Filled 2017-03-30: qty 5

## 2017-03-30 MED ORDER — PANTOPRAZOLE SODIUM 40 MG IV SOLR
40.0000 mg | Freq: Every day | INTRAVENOUS | Status: DC
  Administered 2017-03-30: 40 mg via INTRAVENOUS
  Filled 2017-03-30: qty 40

## 2017-03-30 MED ORDER — PROPOFOL 10 MG/ML IV BOLUS
INTRAVENOUS | Status: DC | PRN
  Administered 2017-03-30: 150 mg via INTRAVENOUS

## 2017-03-30 MED ORDER — ACETAMINOPHEN 160 MG/5ML PO SOLN: 325.0000 mg | ORAL | Status: DC | PRN

## 2017-03-30 MED ORDER — LACTATED RINGERS IV SOLN
INTRAVENOUS | Status: DC
  Administered 2017-03-30 (×2): via INTRAVENOUS

## 2017-03-30 MED ORDER — DEXAMETHASONE SODIUM PHOSPHATE 10 MG/ML IJ SOLN
INTRAMUSCULAR | Status: AC
  Filled 2017-03-30: qty 1

## 2017-03-30 MED ORDER — KCL IN DEXTROSE-NACL 20-5-0.45 MEQ/L-%-% IV SOLN
INTRAVENOUS | Status: DC
  Administered 2017-03-30: 15:00:00 via INTRAVENOUS
  Administered 2017-03-30: 125 mL/h via INTRAVENOUS
  Administered 2017-03-31: 12:00:00 via INTRAVENOUS
  Filled 2017-03-30 (×3): qty 1000

## 2017-03-30 MED ORDER — ENOXAPARIN SODIUM 30 MG/0.3ML ~~LOC~~ SOLN
30.0000 mg | Freq: Two times a day (BID) | SUBCUTANEOUS | Status: DC
  Administered 2017-03-31 (×2): 30 mg via SUBCUTANEOUS
  Filled 2017-03-30 (×2): qty 0.3

## 2017-03-30 MED ORDER — FLUTICASONE PROPIONATE 50 MCG/ACT NA SUSP
1.0000 | Freq: Every day | NASAL | Status: DC | PRN
  Filled 2017-03-30: qty 16

## 2017-03-30 MED ORDER — FENTANYL CITRATE (PF) 100 MCG/2ML IJ SOLN
INTRAMUSCULAR | Status: AC
  Filled 2017-03-30: qty 2

## 2017-03-30 MED ORDER — LACTATED RINGERS IR SOLN
Status: DC | PRN
  Administered 2017-03-30: 1000 mL

## 2017-03-30 MED ORDER — ONDANSETRON HCL 4 MG/2ML IJ SOLN
INTRAMUSCULAR | Status: DC | PRN
  Administered 2017-03-30: 4 mg via INTRAVENOUS

## 2017-03-30 MED ORDER — STERILE WATER FOR IRRIGATION IR SOLN
Status: DC | PRN
  Administered 2017-03-30: 2000 mL

## 2017-03-30 MED ORDER — LIDOCAINE 2% (20 MG/ML) 5 ML SYRINGE
INTRAMUSCULAR | Status: DC | PRN
  Administered 2017-03-30: 100 mg via INTRAVENOUS

## 2017-03-30 MED ORDER — SCOPOLAMINE 1 MG/3DAYS TD PT72
1.0000 | MEDICATED_PATCH | TRANSDERMAL | Status: DC
  Administered 2017-03-30: 1.5 mg via TRANSDERMAL
  Filled 2017-03-30: qty 1

## 2017-03-30 MED ORDER — SUGAMMADEX SODIUM 500 MG/5ML IV SOLN
INTRAVENOUS | Status: AC
  Filled 2017-03-30: qty 5

## 2017-03-30 MED ORDER — ACETAMINOPHEN 325 MG PO TABS: 650.0000 mg | ORAL_TABLET | ORAL | Status: DC | PRN

## 2017-03-30 MED ORDER — DIPHENHYDRAMINE HCL 50 MG/ML IJ SOLN: 12.5000 mg | Freq: Three times a day (TID) | INTRAMUSCULAR | Status: DC | PRN

## 2017-03-30 MED ORDER — FENTANYL CITRATE (PF) 250 MCG/5ML IJ SOLN
INTRAMUSCULAR | Status: AC
  Filled 2017-03-30: qty 5

## 2017-03-30 MED ORDER — ONDANSETRON HCL 4 MG/2ML IJ SOLN
4.0000 mg | Freq: Four times a day (QID) | INTRAMUSCULAR | Status: DC | PRN
  Administered 2017-03-31 (×2): 4 mg via INTRAVENOUS
  Filled 2017-03-30 (×2): qty 2

## 2017-03-30 MED ORDER — PREMIER PROTEIN SHAKE: 2.0000 [oz_av] | ORAL | Status: DC

## 2017-03-30 MED ORDER — PERPHENAZINE 4 MG PO TABS
4.0000 mg | ORAL_TABLET | Freq: Every day | ORAL | Status: DC | PRN
  Filled 2017-03-30: qty 1

## 2017-03-30 MED ORDER — LORAZEPAM 2 MG/ML IJ SOLN: 1.0000 mg | Freq: Three times a day (TID) | INTRAMUSCULAR | Status: DC | PRN

## 2017-03-30 MED ORDER — KETAMINE HCL 10 MG/ML IJ SOLN
INTRAMUSCULAR | Status: DC | PRN
  Administered 2017-03-30: 10 mg via INTRAVENOUS
  Administered 2017-03-30: 30 mg via INTRAVENOUS
  Administered 2017-03-30 (×2): 10 mg via INTRAVENOUS

## 2017-03-30 MED ORDER — DICLOFENAC SODIUM 1 % TD GEL
4.0000 g | Freq: Four times a day (QID) | TRANSDERMAL | Status: DC | PRN
  Filled 2017-03-30: qty 100

## 2017-03-30 MED ORDER — ONDANSETRON HCL 4 MG/2ML IJ SOLN
INTRAMUSCULAR | Status: AC
  Filled 2017-03-30: qty 2

## 2017-03-30 MED ORDER — HEPARIN SODIUM (PORCINE) 5000 UNIT/ML IJ SOLN
5000.0000 [IU] | INTRAMUSCULAR | Status: AC
  Administered 2017-03-30: 5000 [IU] via SUBCUTANEOUS
  Filled 2017-03-30: qty 1

## 2017-03-30 MED ORDER — FENTANYL CITRATE (PF) 100 MCG/2ML IJ SOLN
INTRAMUSCULAR | Status: DC | PRN
  Administered 2017-03-30: 50 ug via INTRAVENOUS
  Administered 2017-03-30: 100 ug via INTRAVENOUS
  Administered 2017-03-30 (×2): 50 ug via INTRAVENOUS

## 2017-03-30 MED ORDER — PROMETHAZINE HCL 25 MG/ML IJ SOLN: 12.5000 mg | Freq: Four times a day (QID) | INTRAMUSCULAR | Status: DC | PRN

## 2017-03-30 MED ORDER — MIDAZOLAM HCL 2 MG/2ML IJ SOLN
INTRAMUSCULAR | Status: DC | PRN
  Administered 2017-03-30: 2 mg via INTRAVENOUS

## 2017-03-30 MED ORDER — FENTANYL 50 MCG/HR TD PT72: 50.0000 ug | MEDICATED_PATCH | TRANSDERMAL | Status: DC

## 2017-03-30 MED ORDER — KETAMINE HCL 10 MG/ML IJ SOLN
INTRAMUSCULAR | Status: AC
  Filled 2017-03-30: qty 1

## 2017-03-30 MED ORDER — METHOCARBAMOL 1000 MG/10ML IJ SOLN
500.0000 mg | Freq: Three times a day (TID) | INTRAVENOUS | Status: DC
  Administered 2017-03-30 – 2017-03-31 (×3): 500 mg via INTRAVENOUS
  Filled 2017-03-30: qty 550
  Filled 2017-03-30 (×2): qty 5
  Filled 2017-03-30 (×2): qty 550

## 2017-03-30 MED ORDER — SODIUM CHLORIDE 0.9 % IJ SOLN
INTRAMUSCULAR | Status: AC
  Filled 2017-03-30: qty 50

## 2017-03-30 MED ORDER — CEFOTETAN DISODIUM-DEXTROSE 2-2.08 GM-% IV SOLR
2.0000 g | INTRAVENOUS | Status: AC
  Administered 2017-03-30: 2 g via INTRAVENOUS
  Filled 2017-03-30: qty 50

## 2017-03-30 MED ORDER — TRAZODONE HCL 100 MG PO TABS
300.0000 mg | ORAL_TABLET | Freq: Every day | ORAL | Status: DC
  Filled 2017-03-30: qty 3

## 2017-03-30 MED ORDER — HYDROMORPHONE HCL-NACL 0.5-0.9 MG/ML-% IV SOSY
0.2500 mg | PREFILLED_SYRINGE | INTRAVENOUS | Status: DC | PRN
  Administered 2017-03-30 (×2): 0.5 mg via INTRAVENOUS

## 2017-03-30 MED ORDER — FENTANYL CITRATE (PF) 100 MCG/2ML IJ SOLN
25.0000 ug | INTRAMUSCULAR | Status: DC | PRN
  Administered 2017-03-30 (×3): 50 ug via INTRAVENOUS

## 2017-03-30 MED ORDER — ROCURONIUM BROMIDE 50 MG/5ML IV SOSY
PREFILLED_SYRINGE | INTRAVENOUS | Status: AC
  Filled 2017-03-30: qty 5

## 2017-03-30 MED ORDER — SUGAMMADEX SODIUM 200 MG/2ML IV SOLN
INTRAVENOUS | Status: DC | PRN
  Administered 2017-03-30: 300 mg via INTRAVENOUS

## 2017-03-30 MED ORDER — BUPIVACAINE LIPOSOME 1.3 % IJ SUSP
INTRAMUSCULAR | Status: DC | PRN
  Administered 2017-03-30: 20 mL

## 2017-03-30 MED ORDER — 0.9 % SODIUM CHLORIDE (POUR BTL) OPTIME
TOPICAL | Status: DC | PRN
  Administered 2017-03-30: 1000 mL

## 2017-03-30 MED ORDER — SUCCINYLCHOLINE CHLORIDE 200 MG/10ML IV SOSY
PREFILLED_SYRINGE | INTRAVENOUS | Status: AC
  Filled 2017-03-30: qty 10

## 2017-03-30 MED ORDER — SODIUM CHLORIDE 0.9 % IJ SOLN
INTRAMUSCULAR | Status: DC | PRN
  Administered 2017-03-30: 50 mL

## 2017-03-30 MED ORDER — PHENYLEPHRINE 40 MCG/ML (10ML) SYRINGE FOR IV PUSH (FOR BLOOD PRESSURE SUPPORT)
PREFILLED_SYRINGE | INTRAVENOUS | Status: DC | PRN
  Administered 2017-03-30: 40 ug via INTRAVENOUS
  Administered 2017-03-30: 80 ug via INTRAVENOUS

## 2017-03-30 MED ORDER — DEXAMETHASONE SODIUM PHOSPHATE 4 MG/ML IJ SOLN: 4.0000 mg | INTRAMUSCULAR | Status: DC

## 2017-03-30 MED ORDER — CHLORHEXIDINE GLUCONATE 4 % EX LIQD: 60.0000 mL | Freq: Once | CUTANEOUS | Status: DC

## 2017-03-30 MED ORDER — METHOCARBAMOL 1000 MG/10ML IJ SOLN
500.0000 mg | Freq: Three times a day (TID) | INTRAVENOUS | Status: DC
  Administered 2017-03-30: 500 mg via INTRAVENOUS
  Filled 2017-03-30: qty 5
  Filled 2017-03-30: qty 550
  Filled 2017-03-30 (×4): qty 5

## 2017-03-30 MED ORDER — PROPOFOL 10 MG/ML IV BOLUS
INTRAVENOUS | Status: AC
  Filled 2017-03-30: qty 20

## 2017-03-30 MED ORDER — INFLUENZA VAC SPLIT QUAD 0.5 ML IM SUSY
0.5000 mL | PREFILLED_SYRINGE | INTRAMUSCULAR | Status: AC
  Administered 2017-03-31: 0.5 mL via INTRAMUSCULAR
  Filled 2017-03-30: qty 0.5

## 2017-03-30 MED ORDER — PROMETHAZINE HCL 25 MG/ML IJ SOLN
6.2500 mg | INTRAMUSCULAR | Status: DC | PRN
  Administered 2017-03-30: 12.5 mg via INTRAVENOUS

## 2017-03-30 MED ORDER — HYDROMORPHONE HCL 1 MG/ML IJ SOLN
1.0000 mg | INTRAMUSCULAR | Status: DC | PRN
  Administered 2017-03-30 (×2): 1 mg via INTRAVENOUS
  Administered 2017-03-30 – 2017-03-31 (×4): 2 mg via INTRAVENOUS
  Filled 2017-03-30 (×3): qty 2
  Filled 2017-03-30: qty 1
  Filled 2017-03-30 (×2): qty 2

## 2017-03-30 MED ORDER — PHENYLEPHRINE 40 MCG/ML (10ML) SYRINGE FOR IV PUSH (FOR BLOOD PRESSURE SUPPORT)
PREFILLED_SYRINGE | INTRAVENOUS | Status: AC
  Filled 2017-03-30: qty 10

## 2017-03-30 MED ORDER — SIMETHICONE 80 MG PO CHEW: 80.0000 mg | CHEWABLE_TABLET | Freq: Four times a day (QID) | ORAL | Status: DC | PRN

## 2017-03-30 SURGICAL SUPPLY — 64 items
APPLICATOR COTTON TIP 6IN STRL (MISCELLANEOUS) IMPLANT
APPLIER CLIP ROT 10 11.4 M/L (STAPLE)
APPLIER CLIP ROT 13.4 12 LRG (CLIP)
BANDAGE ADH SHEER 1  50/CT (GAUZE/BANDAGES/DRESSINGS) IMPLANT
BENZOIN TINCTURE PRP APPL 2/3 (GAUZE/BANDAGES/DRESSINGS) ×2 IMPLANT
BLADE SURG SZ11 CARB STEEL (BLADE) ×2 IMPLANT
CABLE HIGH FREQUENCY MONO STRZ (ELECTRODE) IMPLANT
CHLORAPREP W/TINT 26ML (MISCELLANEOUS) ×4 IMPLANT
CLIP APPLIE ROT 10 11.4 M/L (STAPLE) IMPLANT
CLIP APPLIE ROT 13.4 12 LRG (CLIP) IMPLANT
DECANTER SPIKE VIAL GLASS SM (MISCELLANEOUS) ×2 IMPLANT
DERMABOND ADVANCED (GAUZE/BANDAGES/DRESSINGS)
DERMABOND ADVANCED .7 DNX12 (GAUZE/BANDAGES/DRESSINGS) IMPLANT
DEVICE SUT QUICK LOAD TK 5 (STAPLE) IMPLANT
DEVICE SUT TI-KNOT TK 5X26 (MISCELLANEOUS) IMPLANT
DEVICE SUTURE ENDOST 10MM (ENDOMECHANICALS) IMPLANT
DRAPE UTILITY XL STRL (DRAPES) ×4 IMPLANT
ELECT L-HOOK LAP 45CM DISP (ELECTROSURGICAL)
ELECT PENCIL ROCKER SW 15FT (MISCELLANEOUS) IMPLANT
ELECT REM PT RETURN 15FT ADLT (MISCELLANEOUS) ×2 IMPLANT
ELECTRODE L-HOOK LAP 45CM DISP (ELECTROSURGICAL) IMPLANT
GAUZE SPONGE 2X2 8PLY STRL LF (GAUZE/BANDAGES/DRESSINGS) IMPLANT
GAUZE SPONGE 4X4 12PLY STRL (GAUZE/BANDAGES/DRESSINGS) IMPLANT
GLOVE BIO SURGEON STRL SZ7.5 (GLOVE) ×2 IMPLANT
GLOVE INDICATOR 8.0 STRL GRN (GLOVE) ×2 IMPLANT
GOWN STRL REUS W/TWL XL LVL3 (GOWN DISPOSABLE) ×6 IMPLANT
GRASPER SUT TROCAR 14GX15 (MISCELLANEOUS) IMPLANT
HOVERMATT SINGLE USE (MISCELLANEOUS) ×2 IMPLANT
KIT BASIN OR (CUSTOM PROCEDURE TRAY) ×2 IMPLANT
MARKER SKIN DUAL TIP RULER LAB (MISCELLANEOUS) ×2 IMPLANT
NEEDLE SPNL 22GX3.5 QUINCKE BK (NEEDLE) ×2 IMPLANT
PACK UNIVERSAL I (CUSTOM PROCEDURE TRAY) ×2 IMPLANT
RELOAD STAPLER BLUE 60MM (STAPLE) ×4 IMPLANT
RELOAD STAPLER GOLD 60MM (STAPLE) ×2 IMPLANT
RELOAD STAPLER GREEN 60MM (STAPLE) ×2 IMPLANT
SCISSORS LAP 5X45 EPIX DISP (ENDOMECHANICALS) ×2 IMPLANT
SEALANT SURGICAL APPL DUAL CAN (MISCELLANEOUS) IMPLANT
SET IRRIG TUBING LAPAROSCOPIC (IRRIGATION / IRRIGATOR) ×2 IMPLANT
SHEARS HARMONIC ACE PLUS 45CM (MISCELLANEOUS) ×2 IMPLANT
SLEEVE GASTRECTOMY 40FR VISIGI (MISCELLANEOUS) ×2 IMPLANT
SLEEVE XCEL OPT CAN 5 100 (ENDOMECHANICALS) ×6 IMPLANT
SOLUTION ANTI FOG 6CC (MISCELLANEOUS) ×2 IMPLANT
SPONGE GAUZE 2X2 STER 10/PKG (GAUZE/BANDAGES/DRESSINGS)
SPONGE LAP 18X18 X RAY DECT (DISPOSABLE) ×2 IMPLANT
STAPLER ECHELON BIOABSB 60 FLE (MISCELLANEOUS) ×8 IMPLANT
STAPLER ECHELON LONG 60 440 (INSTRUMENTS) IMPLANT
STAPLER RELOAD BLUE 60MM (STAPLE) ×8
STAPLER RELOAD GOLD 60MM (STAPLE) ×4
STAPLER RELOAD GREEN 60MM (STAPLE) ×4
STRIP CLOSURE SKIN 1/2X4 (GAUZE/BANDAGES/DRESSINGS) ×2 IMPLANT
SUT MNCRL AB 4-0 PS2 18 (SUTURE) ×2 IMPLANT
SUT SURGIDAC NAB ES-9 0 48 120 (SUTURE) IMPLANT
SUT VICRYL 0 TIES 12 18 (SUTURE) ×2 IMPLANT
SYR 10ML ECCENTRIC (SYRINGE) ×2 IMPLANT
SYR 20CC LL (SYRINGE) ×2 IMPLANT
SYR 50ML LL SCALE MARK (SYRINGE) ×2 IMPLANT
TOWEL OR 17X26 10 PK STRL BLUE (TOWEL DISPOSABLE) ×2 IMPLANT
TOWEL OR NON WOVEN STRL DISP B (DISPOSABLE) ×2 IMPLANT
TRAY FOLEY W/METER SILVER 16FR (SET/KITS/TRAYS/PACK) IMPLANT
TROCAR BLADELESS 15MM (ENDOMECHANICALS) ×2 IMPLANT
TROCAR BLADELESS OPT 5 100 (ENDOMECHANICALS) ×2 IMPLANT
TUBING CONNECTING 10 (TUBING) ×2 IMPLANT
TUBING ENDO SMARTCAP (MISCELLANEOUS) ×2 IMPLANT
TUBING INSUF HEATED (TUBING) ×2 IMPLANT

## 2017-03-30 NOTE — Transfer of Care (Signed)
Immediate Anesthesia Transfer of Care Note  Patient: [name]  Procedure(s) Performed: Procedure(s): LAPAROSCOPIC GASTRIC SLEEVE RESECTION, UPPER ENDO (N/A)  Patient Location: PACU  Anesthesia Type:General  Level of Consciousness: awake and alert   Airway & Oxygen Therapy: Patient Spontanous Breathing and Patient connected to face mask oxygen  Post-op Assessment: Report given to RN and Post -op Vital signs reviewed and stable  Post vital signs: Reviewed and stable  Last Vitals:  Vitals:   03/30/17 0855  BP: 134/76  Pulse: 89  Resp: 16  Temp: 36.9 C  SpO2: 98%    Last Pain:  Vitals:   03/30/17 0855  TempSrc: Oral      Patients Stated Pain Goal: 4 (01/56/15 3794)  Complications: No apparent anesthesia complications

## 2017-03-30 NOTE — Anesthesia Postprocedure Evaluation (Signed)
Anesthesia Post Note  Patient: [name]  Procedure(s) Performed: Procedure(s) (LRB): LAPAROSCOPIC GASTRIC SLEEVE RESECTION, UPPER ENDO (N/A)     Patient location during evaluation: PACU Anesthesia Type: General Level of consciousness: awake and alert Pain management: pain level controlled Vital Signs Assessment: post-procedure vital signs reviewed and stable Respiratory status: spontaneous breathing, nonlabored ventilation, respiratory function stable and patient connected to nasal cannula oxygen Cardiovascular status: blood pressure returned to baseline and stable Postop Assessment: no apparent nausea or vomiting Anesthetic complications: no    Last Vitals:  Vitals:   03/30/17 1345 03/30/17 1400  BP: (!) 144/86 (!) 152/81  Pulse: 80 82  Resp: 17 17  Temp: 36.9 C 37.8 C  SpO2: 98%     Last Pain:  Vitals:   03/30/17 1345  TempSrc:   PainSc: Tyler Deis

## 2017-03-30 NOTE — H&P (View-Only) (Signed)
[name] 03/27/2017 8:40 AM Location: [facility] Patient #: [id number] DOB: 1966-06-21 Single / Language: [name] / Race: White Female  History of Present Illness [name] [name] MD; 03/27/2017 10:48 AM) The patient is a 51 year old female who presents for a bariatric surgery evaluation. She comes in today for her preoperative appointment. She denies any significant medical changes since she was initially seen a few months ago. She has lost weight as part of her preop diet plan. Her upper GI was unremarkable. Her chest x-ray was normal. She did see cardiology around the end of August and was found to be low risk for surgery. She also saw hematology because of her family history specifically her father's history of blood clots. They recommended 3 months of anticoagulation after surgery. She is accompanied by her husband today. She denies any chest pain, chest pressure, source of breath, orthopnea, paroxysmal nocturnal dyspnea today. She denies any melena or hematochezia. She is still on chronic pain medication and is managed by [facility]. She continues to have musculoskeletal pain. Her bariatric evaluation labs were unremarkable. She did have a hemoglobin A1c which was 5.7.  Review of systems-a comprehensive 12 point review systems was performed and all systems are negative except for what is mentioned above   08/28/16 She is referred by Dr [name] for evaluation of weight loss surgery. She participated in our online seminar. She is interested in sleeve gastrectomy. She would like to lose weight as well as to lessen her chronic pain. She has struggled with her weight since high school. Despite numerous attempts for sustained weight loss she's been unsuccessful. She has tried [employer], [employer] on several occasions and Phen-Fen before it was pulled from the market. Her comorbidities include hypertension, prediabetes, chronic back pain, chronic  knee pain.  She endorses shortness of breath, dyspnea on exertion, two-pillow orthopnea, paroxysmal nocturnal dyspnea. She reports no personal history of blood clots however she does have a family history of blood clots. She reports that her father has had multiple blood clots. She is initially by her mother. They both state that they did not occur after a traumatic fall or prolonged hospitalization. To their knowledge he has not had a genetic workup regarding his tendency to have blood clots. The wife states that he has had at least 10 blood clots and is on chronic anticoagulation. She reportedly had a sleep study in 2013. I don't have the actual records that this comes from an office visit note from [facility] "Mild Obstructive sleep apnea (AHI 7, REM AHI 22, CPAP 10) (02/04/2012)". It appears that she underwent a repeat sleep study in 2015 at [facility] but I don't have a copy of those records.  She has some occasional sensation of reflux. She has no difficulty eating or drinking solids with the exception of sometimes states feels like he gets stuck. She has constipation issues and averages a bowel movement every 2 days. She is on Colace, senna and amitiza. She has chronic back pain as well as right knee pain. Her back pain is in the center back that radiates to bilateral hips with some right lower leg sciatica. She has had one spinal fusion as well as 2 foot surgeries. She states that she is prediabetic.  She denies any tobacco or drugs or alcohol. She is on disability.  She is on chronic pain medication consisting of fentanyl and dilaudid. She has a pain contract with [facility].  Problem List/Past Medical [name] M. 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 Redmond Pulling, MD; 03/27/2017 10:51 AM) Foot Surgery Bilateral. Knee Surgery Bilateral. Oral Surgery Spinal Surgery - Lower Back Tonsillectomy  Diagnostic Studies History [name] M. Redmond Pulling, MD; 03/27/2017 10:51 AM) Colonoscopy 1-5 years ago Mammogram 1-3 years ago Pap Smear 1-5 years ago  Allergies Malachy Moan, RMA; 03/27/2017 8:40 AM) Gabapentin *ANTICONVULSANTS* Nausea, Vomiting, Anaphylaxis. Pineapple Concentrate *PHARMACEUTICAL ADJUVANTS* Shortness of breath. SHELLFISH Anaphylaxis. Erythromycin *DERMATOLOGICALS* Swelling. Latex Exam Gloves *MEDICAL DEVICES AND SUPPLIES* Pregabalin *CHEMICALS* Swelling. DULoxetine HCl *ANTIDEPRESSANTS* Itching. Erythromycin Base *MACROLIDES* Swelling. Pollen Extracts *ALTERNATIVE MEDICINES*  Medication History [name] M. Redmond Pulling, MD; 03/27/2017 10:51 AM) ALPRAZolam (1MG  Tablet, Oral) Active. Amitiza (24MCG Capsule, Oral) Active. BuPROPion HCl ER (XL) (300MG  Tablet ER 24HR, Oral) Active. BuPROPion HCl ER (XL) (150MG  Tablet ER 24HR, Oral) Active. Albuterol Sulfate ((2.5 MG/3ML)0.083% Nebulized Soln, Inhalation) Active. ClonazePAM (1MG  Tablet, Oral) Active. Escitalopram Oxalate (10MG  Tablet, Oral) Active. FentaNYL (50MCG/HR Patch 72HR, Transdermal) Active. HydroCHLOROthiazide (12.5MG  Capsule, Oral) Active. MetFORMIN HCl (1000MG  Tablet, Oral) Active. HYDROmorphone HCl (2MG  Tablet, Oral) Active. Nortriptyline HCl (25MG  Capsule, Oral) Active. TraZODone HCl (100MG  Tablet, Oral) Active. Omeprazole (20MG  Capsule DR, Oral) Active. Cyclobenzaprine HCl (10MG  Tablet, Oral) Active. Fluticasone Propionate (50MCG/ACT Suspension, Nasal) Active. Dulera (200-5MCG/ACT Aerosol, Inhalation) Active. Omeprazole (20MG  Capsule ER, Oral) Active. Perphenazine (4MG  Tablet, Oral) Active. Movantik (25MG  Tablet, Oral) Active. Medications Reconciled Zofran ODT (4MG  Tablet Disint, 1 (one) Tablet  Oral every six hours, as needed, Taken starting 03/27/2017) Active.  Social History [name] M. Redmond Pulling, MD; 03/27/2017 10:51 AM) Alcohol use Occasional alcohol use. Caffeine use Tea. No drug use Tobacco use Never smoker.  Family History [name] M. Redmond Pulling, MD; 03/27/2017 10:51 AM) Alcohol Abuse Family Members In General, Sister. Arthritis Father. Bleeding disorder Father. Breast Cancer Family Members In General. Cancer Father. Depression Mother, Sister. Heart Disease Family Members In General, Father. Heart disease in female family member before age 30 Hypertension Father, Mother, Sister. Migraine Headache Father. Respiratory Condition Family Members In General, Father.  Pregnancy / Birth History [name] M. Redmond Pulling, MD; 03/27/2017 10:51 AM) Age at menarche 58 years. Gravida 1 Irregular periods Maternal age 3-25 Para 0  Other Problems [name] M. Redmond Pulling, MD; 03/27/2017 10:51 AM) Anxiety Disorder Arthritis Asthma DEPRESSION, CONTROLLED (F32.9) CHRONIC BILATERAL LOW BACK PAIN WITH RIGHT-SIDED SCIATICA (M54.41) GASTROESOPHAGEAL REFLUX DISEASE, ESOPHAGITIS PRESENCE NOT SPECIFIED (K21.9) HYPERTENSION, ESSENTIAL (I10) SLEEP APNEA, OBSTRUCTIVE (G47.33)    Vitals Malachy Moan RMA; 03/27/2017 8:41 AM) 03/27/2017 8:40 AM Weight: 232 lb Height: 66in Body Surface Area: 2.13 m Body Mass Index: 37.45 kg/m  Temp.: 98.27F  Pulse: 112 (Regular)  BP: 140/96 (Sitting, Left Arm, Standard)      Physical Exam [name] M. Carrianne Hyun MD; 03/27/2017 10:48 AM)  General Mental Status-Alert. General Appearance-Consistent with stated age. Hydration-Well hydrated. Voice-Normal. Note: morbidly obese  Head and Neck Head-normocephalic, atraumatic with no lesions or palpable masses. Trachea-midline. Thyroid Gland Characteristics - normal size and consistency.  Eye Eyeball - Bilateral-Extraocular movements intact. Sclera/Conjunctiva -  Bilateral-No scleral icterus.  Chest and Lung Exam Chest and lung exam reveals -quiet, even and easy respiratory effort with no use of accessory muscles and on auscultation, normal breath sounds, no adventitious sounds and normal vocal resonance. Inspection Chest Wall - Normal. Back - normal.  Breast - Did not examine.  Cardiovascular Cardiovascular  examination reveals -normal heart sounds, regular rate and rhythm with no murmurs and normal pedal pulses bilaterally.  Abdomen Inspection Inspection of the abdomen reveals - No Hernias. Skin - Scar - no surgical scars. Palpation/Percussion Palpation and Percussion of the abdomen reveal - Soft, Non Tender, No Rebound tenderness, No Rigidity (guarding) and No hepatosplenomegaly. Auscultation Auscultation of the abdomen reveals - Bowel sounds normal.  Peripheral Vascular Upper Extremity Palpation - Pulses bilaterally normal.  Neurologic Neurologic evaluation reveals -alert and oriented x 3 with no impairment of recent or remote memory. Mental Status-Normal.  Neuropsychiatric The patient's mood and affect are described as -normal. Judgment and Insight-insight is appropriate concerning matters relevant to self.  Musculoskeletal Normal Exam - Left-Upper Extremity Strength Normal and Lower Extremity Strength Normal. Normal Exam - Right-Upper Extremity Strength Normal and Lower Extremity Strength Normal.  Lymphatic Head & Neck  General Head & Neck Lymphatics: Bilateral - Description - Normal. Axillary - Did not examine. Femoral & Inguinal - Did not examine.    Assessment & Plan [name] M. Alyze Lauf MD; 03/27/2017 10:50 AM)  OBESITY (BMI 30-39.9) (E66.9) Impression: The patient meets weight loss surgery criteria. I think the patient would be an acceptable candidate for Laparoscopic vertical sleeve gastrectomy.  We re discussed LSG ane the operative and postoperative process. We discussed her workup. We discussed the  typical recovery period. We will coordinate with [facility] regarding her postoperative pain medicine prescription. She was given a prescription for nausea medicine for after surgery. I congratulated her on her weight loss so far. She was given preoperative instructions.  Current Plans Pt Education - EMW_preopbariatric Started Zofran ODT 4MG , 1 (one) Tablet every six hours, as needed, #15, 03/27/2017, No Refill. FAMILY HISTORY OF BLOOD CLOTS (Z82.49) Impression: Hematology recommended 3 months of Xarelto at 10 mg per day. I advised the patient that I would initially send her home on once a day Lovenox injections and then switch her to [place], ESOPHAGITIS PRESENCE NOT SPECIFIED (K21.9) Impression: Upper GI unremarkable. Did discuss small increased risk of reflux after sleeve gastrectomy  DEPRESSION, CONTROLLED (F32.9)  CHRONIC BILATERAL LOW BACK PAIN WITH RIGHT-SIDED SCIATICA (M54.41)  HYPERTENSION, ESSENTIAL (I10)  SLEEP APNEA, OBSTRUCTIVE (G47.33)  PREDIABETES (R73.03)  Leighton Ruff. Redmond Pulling, MD, FACS General, Bariatric, & Minimally Invasive Surgery [facility], [place]

## 2017-03-30 NOTE — Interval H&P Note (Signed)
History and Physical Interval Note:  03/30/2017 9:58 AM  [name]  has presented today for surgery, with the diagnosis of Morbid Obesity, SOB, DOE, F/H of Blood Clots   The various methods of treatment have been discussed with the patient and family. After consideration of risks, benefits and other options for treatment, the patient has consented to  Procedure(s): LAPAROSCOPIC GASTRIC SLEEVE RESECTION, UPPER ENDO (N/A) as a surgical intervention .  The patient's history has been reviewed, patient examined, no change in status, stable for surgery.  I have reviewed the patient's chart and labs.  Questions were answered to the patient's satisfaction.    Leighton Ruff. Redmond Pulling, MD, [place], Bariatric, & Minimally Invasive Surgery [facility], [place]  [facility] M

## 2017-03-30 NOTE — Op Note (Signed)
03/30/2017 [name] 22-Oct-1965 [id number]   PRE-OPERATIVE DIAGNOSIS:     Morbid obesity (BMI 36)   HTN (hypertension)   Pre-diabetes   Asthma   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)   Esophageal reflux   Dyslipidemia   Arthritis of right knee  POST-OPERATIVE DIAGNOSIS:  same  PROCEDURE:  Procedure(s): LAPAROSCOPIC SLEEVE GASTRECTOMY  UPPER GI ENDOSCOPY  SURGEON:  Surgeon(s): Gayland Curry, MD FACS FASMBS  ASSISTANTS: Gurney Maxin MD  ANESTHESIA:   general  DRAINS: none   BOUGIE: 40 fr ViSiGi  LOCAL MEDICATIONS USED:  MARCAINE + Exparel  SPECIMEN:  Source of Specimen:  Greater curvature of stomach  DISPOSITION OF SPECIMEN:  PATHOLOGY  COUNTS:  YES  INDICATION FOR PROCEDURE: This is a very pleasant 51 y.o.-year-old morbidly obese female who has had unsuccessful attempts for sustained weight loss. The patient presents today for a planned laparoscopic sleeve gastrectomy with upper endoscopy. We have discussed the risk and benefits of the procedure extensively preoperatively. Please see my separate notes.  PROCEDURE: After obtaining informed consent and receiving 5000 units of subcutaneous heparin, the patient was brought to the operating room at [facility] and placed supine on the operating room table. General endotracheal anesthesia was established. Sequential compression devices were placed. A orogastric tube was placed. The patient's abdomen was prepped and draped in the usual standard surgical fashion. The patient received preoperative IV antibiotic. A surgical timeout was performed.  Access to the abdomen was achieved using a 5 mm 0 laparoscope thru a 5 mm trocar In the left upper Quadrant 2 fingerbreadths below the left subcostal margin using the Optiview technique. Initial pneumoperitoneum pressure readings were elevated at around 15. I pulled back on the trocar slightly and still had elevated intra-abdominal pressures. Therefore CO2 was turned  off and I removed the trocar. I then went in through the same incision but angled slightly more medially with the 5 mm 0 laparoscope and advanced it through all layers of the abdominal wall and entered the abdominal cavity. Initial pneumoperitoneum readings were 5 mmHg. There is smooth insufflation of the abdomen at this point. Pneumoperitoneum was smoothly established up to 15 mm of mercury. The laparoscope was advanced and the abdominal cavity was surveilled.    A 5 mm trocar was placed slightly above and to the left of the umbilicus under direct visualization.  The [facility] liver retractor was placed under the left lobe of the liver through a 5 mm trocar incision site in the subxiphoid position. A 5 mm trocar was placed in the lateral right upper quadrant along with a 15 mm trocar in the mid right abdomen. A final 5 mm trocar was placed in the lateral LUQ.  All under direct visualization after exparel had been infiltrated in bilateral lateral upper abdominal walls as a TAP block. I lifted up the omentum and visualize a small hematoma in the midportion of the transverse colon mesentery. I decided to run the small bowel. The bowel was ran with atraumatic bowel graspers and there is no evidence of injury to the small bowel. We then returned to the transverse colon. The omentum was taken off of the transverse colon with harmonic scalpel. The colon was inspected in this location. There is no evidence of colotomy. The patient was then placed in reverse Trendelenburg  The stomach was inspected. It was completely decompressed and the orogastric tube was removed.  There was no dimple that was obviously visible at the hiatus. Her upper GI preoperatively  showed no evidence of a hiatal hernia.  We identified the pylorus and measured 6 cm proximal to the pylorus and identified an area of where we would start taking down the short gastric vessels. Harmonic scalpel was used to take down the short gastric vessels along  the greater curvature of the stomach. We were able to enter the lesser sac. We continued to march along the greater curvature of the stomach taking down the short gastrics. As we approached the gastrosplenic ligament we took care in this area not to injure the spleen. We were able to take down the entire gastrosplenic ligament. We then mobilized the fundus away from the left crus of diaphragm. There were not any significant posterior gastric avascular attachments. This left the stomach completely mobilized. No vessels had been taken down along the lesser curvature of the stomach.  We then reidentified the pylorus. A 40Fr ViSiGi was then placed in the oropharynx and advanced down into the stomach and placed in the distal antrum and positioned along the lesser curvature. It was placed under suction which secured the 40Fr ViSiGi in place along the lesser curve. Then using the [employer] echelon 60 mm stapler with a green load with Seamguard, I placed a stapler along the antrum approximately 5 cm from the pylorus. The stapler was angled so that there is ample room at the angularis incisura. I then fired the first staple load after inspecting it posteriorly to ensure adequate space both anteriorly and posteriorly. At this point I still was not completely past the angularis so with another green load with Seamguard, I placed the stapler in position just inside the prior stapleline. We then rotated the stomach to insure that there was adequate anteriorly as well as posteriorly. The stapler was then fired.  At this point I started using 60 mm gold load staple cartridge x1 with Seamguard. The echelon stapler was then repositioned with a 60 mm blue load with Seamguard and we continued to march up along the [place]. My assistant was holding traction along the greater curvature stomach along the cauterized short gastric vessels ensuring that the stomach was symmetrically retracted. Prior to each firing of the staple, we rotated  the stomach to ensure that there is adequate stomach left.  As we approached the fundus, I used 60 mm blue cartridge with Seamguard aiming slightly lateral to the esophageal fat pad. Although the staples on this fire had completely gone thru the last part of the stomach it had not completely cut it but the intact staple line was completely past the fundal edge of the stomach. Therefore using scissors divided the seamguard  to free the remaining stomach. The sleeve was inspected. There is no evidence of cork screw. The staple line appeared hemostatic. The CRNA inflated the ViSiGi to the green zone and the upper abdomen was flooded with saline. There were no bubbles. The sleeve was decompressed and the ViSiGi removed. My assistant scrubbed out and performed an upper endoscopy. The sleeve easily distended with air and the scope was easily advanced to the pylorus. There is no evidence of internal bleeding or cork screwing. There was no narrowing at the angularis. There is no evidence of bubbles. Please see his operative note for further details. The gastric sleeve was decompressed and the endoscope was removed.  The greater curvature the stomach was grasped with a laparoscopic grasper and removed from the 15 mm trocar site.  The liver retractor was removed. I then closed the 15 mm trocar site  with 1 interrupted 0 Vicryl sutures through the fascia using the endoclose. The closure was viewed laparoscopically and it was airtight. Remaining Exparel was then infiltrated in the preperitoneal spaces around the trocar sites. I reinspected the transverse colon and its mesentery. There is no evidence of staining. There is again no evidence of colotomy. Pneumoperitoneum was released. All trocar sites were closed with a 4-0 Monocryl in a subcuticular fashion followed by the application of benzoin, steri-strips, and bandages. The patient was extubated and taken to the recovery room in stable condition. All needle, instrument, and  sponge counts were correct x2. There are no immediate complications  (2) 60 mm green with Seamguard (1) 60 mm gold with seamguard (3) 60 mm blue with  seamguard  PLAN OF CARE: Admit to inpatient   PATIENT DISPOSITION:  PACU - hemodynamically stable.   Delay start of Pharmacological VTE agent (>24hrs) due to surgical blood loss or risk of bleeding:  no  [name]. Redmond Pulling, MD, FACS FASMBS General, Bariatric, & Minimally Invasive Surgery [facility], [place]

## 2017-03-30 NOTE — Anesthesia Preprocedure Evaluation (Addendum)
Anesthesia Evaluation  Patient identified by MRN, date of birth, ID band Patient awake    Reviewed: Allergy & Precautions, NPO status , Patient's Chart, lab work & pertinent test results  Airway Mallampati: II  TM Distance: >3 FB Neck ROM: Full    Dental  (+) Dental Advisory Given   Pulmonary shortness of breath, asthma , sleep apnea ,    breath sounds clear to auscultation       Cardiovascular hypertension, Pt. on medications  Rhythm:Regular Rate:Normal     Neuro/Psych Anxiety Depression Bipolar Disorder negative neurological ROS     GI/Hepatic Neg liver ROS, GERD  Medicated,  Endo/Other  diabetes, Type 2, Oral Hypoglycemic Agents  Renal/GU negative Renal ROS     Musculoskeletal  (+) Arthritis ,   Abdominal   Peds  Hematology  (+) anemia ,   Anesthesia Other Findings   Reproductive/Obstetrics                            Lab Results  Component Value Date   WBC 11.9 (H) 03/26/2017   HGB 13.5 03/26/2017   HCT 40.8 03/26/2017   MCV 80.2 03/26/2017   PLT 307 03/26/2017   Lab Results  Component Value Date   CREATININE 0.88 03/26/2017   BUN 23 (H) 03/26/2017   NA 138 03/26/2017   K 3.7 03/26/2017   CL 101 03/26/2017   CO2 29 03/26/2017    Anesthesia Physical Anesthesia Plan  ASA: III  Anesthesia Plan: General   Post-op Pain Management:    Induction: Intravenous  PONV Risk Score and Plan: 4 or greater and Ondansetron, Dexamethasone, Midazolam, Scopolamine patch - Pre-op and Treatment may vary due to age or medical condition  Airway Management Planned: Oral ETT  Additional Equipment:   Intra-op Plan:   Post-operative Plan: Extubation in OR  Informed Consent: I have reviewed the patients History and Physical, chart, labs and discussed the procedure including the risks, benefits and alternatives for the proposed anesthesia with the patient or authorized representative who  has indicated his/her understanding and acceptance.   Dental advisory given  Plan Discussed with: CRNA  Anesthesia Plan Comments:         Anesthesia Quick Evaluation

## 2017-03-30 NOTE — Anesthesia Procedure Notes (Signed)
Procedure Name: Intubation Date/Time: 03/30/2017 10:42 AM Performed by: Sharlette Dense Pre-anesthesia Checklist: Patient identified, Emergency Drugs available, Suction available and Patient being monitored Patient Re-evaluated:Patient Re-evaluated prior to induction Oxygen Delivery Method: Circle system utilized Preoxygenation: Pre-oxygenation with 100% oxygen Induction Type: IV induction Ventilation: Mask ventilation without difficulty Laryngoscope Size: 2 Grade View: Grade I Tube type: Oral Tube size: 7.5 mm Number of attempts: 1 Airway Equipment and Method: Stylet and Oral airway Placement Confirmation: ETT inserted through vocal cords under direct vision,  positive ETCO2 and breath sounds checked- equal and bilateral Secured at: 21 cm Tube secured with: Tape Dental Injury: Teeth and Oropharynx as per pre-operative assessment  Comments: Intubation performed by Colen Darling SRNA

## 2017-03-30 NOTE — Op Note (Signed)
Preoperative diagnosis: laparoscopic sleeve gastrectomy  Postoperative diagnosis: Same   Procedure: Upper endoscopy   Surgeon: Gurney Maxin, M.D.  Anesthesia: Gen.   Indications for procedure: This patient was undergoing a laparoscopic sleeve gastrectomy.   Description of procedure: The endoscopy was placed in the mouth and into the oropharynx and under endoscopic vision it was advanced to the esophagogastric junction. The pouch was insufflated and no bleeding or bubbles were seen. The GEJ was identified at 43cm from the teeth. No bleeding or leaks were detected. The scope was withdrawn without difficulty.   Gurney Maxin, M.D. General, Bariatric, & Minimally Invasive Surgery [facility] [place]

## 2017-03-30 NOTE — Progress Notes (Signed)
Postoperative day goal education complete.  Discussed with patient frequent ambulation x 4/day, IS use, and diet progression. Ice pack provided to patient for incisional discomfort.  No questions.  Family at bedside.

## 2017-03-31 ENCOUNTER — Encounter (HOSPITAL_COMMUNITY): Payer: Self-pay | Admitting: General Surgery

## 2017-03-31 LAB — CBC WITH DIFFERENTIAL/PLATELET
BASOS PCT: 0 %
Basophils Absolute: 0 10*3/uL (ref 0.0–0.1)
EOS ABS: 0.1 10*3/uL (ref 0.0–0.7)
EOS PCT: 1 %
HCT: 39.7 % (ref 36.0–46.0)
Hemoglobin: 12.6 g/dL (ref 12.0–15.0)
LYMPHS ABS: 2.6 10*3/uL (ref 0.7–4.0)
Lymphocytes Relative: 22 %
MCH: 26.1 pg (ref 26.0–34.0)
MCHC: 31.7 g/dL (ref 30.0–36.0)
MCV: 82.4 fL (ref 78.0–100.0)
MONOS PCT: 9 %
Monocytes Absolute: 1.1 10*3/uL — ABNORMAL HIGH (ref 0.1–1.0)
Neutro Abs: 8.1 10*3/uL — ABNORMAL HIGH (ref 1.7–7.7)
Neutrophils Relative %: 68 %
PLATELETS: 309 10*3/uL (ref 150–400)
RBC: 4.82 MIL/uL (ref 3.87–5.11)
RDW: 15.6 % — ABNORMAL HIGH (ref 11.5–15.5)
WBC: 12 10*3/uL — AB (ref 4.0–10.5)

## 2017-03-31 LAB — COMPREHENSIVE METABOLIC PANEL
ALT: 23 U/L (ref 14–54)
ANION GAP: 9 (ref 5–15)
AST: 26 U/L (ref 15–41)
Albumin: 4.1 g/dL (ref 3.5–5.0)
Alkaline Phosphatase: 72 U/L (ref 38–126)
BUN: 7 mg/dL (ref 6–20)
CALCIUM: 9.3 mg/dL (ref 8.9–10.3)
CHLORIDE: 101 mmol/L (ref 101–111)
CO2: 29 mmol/L (ref 22–32)
Creatinine, Ser: 0.82 mg/dL (ref 0.44–1.00)
GFR calc non Af Amer: >60 mL/min (ref >60)
Glucose, Bld: 102 mg/dL — ABNORMAL HIGH (ref 65–99)
POTASSIUM: 4 mmol/L (ref 3.5–5.1)
SODIUM: 139 mmol/L (ref 135–145)
Total Bilirubin: 0.5 mg/dL (ref 0.3–1.2)
Total Protein: 7.4 g/dL (ref 6.5–8.1)

## 2017-03-31 MED ORDER — ENOXAPARIN SODIUM 40 MG/0.4ML ~~LOC~~ SOLN
40.0000 mg | SUBCUTANEOUS | 0 refills | Status: DC
Start: 2017-03-31 — End: 2017-12-21

## 2017-03-31 MED ORDER — ENOXAPARIN (LOVENOX) PATIENT EDUCATION KIT
PACK | Freq: Once | Status: DC
  Filled 2017-03-31: qty 1

## 2017-03-31 MED ORDER — OXYCODONE HCL 5 MG/5ML PO SOLN: 5.0000 mg | ORAL | 0 refills | Status: DC | PRN

## 2017-03-31 NOTE — Progress Notes (Signed)
Patient alert and oriented, pain is controlled. Patient is tolerating fluids, advanced to protein shake today, patient is tolerating well.  Reviewed Gastric sleeve discharge instructions with patient and patient is able to articulate understanding.  Provided information on BELT program, Support Group and [facility] outpatient pharmacy. All questions answered, will continue to monitor.  

## 2017-03-31 NOTE — Progress Notes (Signed)
Patient alert and oriented, Post op day 1.  Provided support and encouragement.  Encouraged pulmonary toilet, ambulation and small sips of liquids.  completed 12 ounces of clear fluid, started protein.  All questions answered.  Will continue to monitor.

## 2017-03-31 NOTE — Progress Notes (Signed)
Nutrition Education Note  Received consult for diet education per DROP protocol.   Discussed 2 week post op diet with pt. Emphasized that liquids must be non carbonated, non caffeinated, and sugar free. Fluid goals discussed. Pt to follow up with outpatient bariatric RD for further diet progression after 2 weeks. Multivitamins and minerals also reviewed.  Pt reports already taking multivitamin and minerals at home, states she got Celebrate brand. Pt reports preparing with Premier Protein vanilla and cookies and cream flavors.  Pt inquired about how many sugar-free Popsicles she can consume in one day. Pt taking fluids down well.   Teach back method used, pt expressed understanding, expect good compliance.  Diet: First 2 Weeks  You will see the nutritionist about two (2) weeks after your surgery. The nutritionist will increase the types of foods you can eat if you are handling liquids well:  If you have severe vomiting or nausea and cannot handle clear liquids lasting longer than 1 day, call your surgeon  Protein Shake  Drink at least 2 ounces of shake 5-6 times per day  Each serving of protein shakes (usually 8 - 12 ounces) should have a minimum of:  15 grams of protein  And no more than 5 grams of carbohydrate  Goal for protein each day:  Men = 80 grams per day  Women = 60 grams per day  Protein powder may be added to fluids such as non-fat milk or Lactaid milk or Soy milk (limit to 35 grams added protein powder per serving)   Hydration  Slowly increase the amount of water and other clear liquids as tolerated (See Acceptable Fluids)  Slowly increase the amount of protein shake as tolerated  Sip fluids slowly and throughout the day  May use sugar substitutes in small amounts (no more than 6 - 8 packets per day; i.e. Splenda)   Fluid Goal  The first goal is to drink at least 8 ounces of protein shake/drink per day (or as directed by the nutritionist); some examples of protein shakes  are Premier Protein, Johnson & Johnson, AMR Corporation, EAS Edge HP, and Unjury. See handout from pre-op Bariatric Education Class:  Slowly increase the amount of protein shake you drink as tolerated  You may find it easier to slowly sip shakes throughout the day  It is important to get your proteins in first  Your fluid goal is to drink 64 - 100 ounces of fluid daily  It may take a few weeks to build up to this  32 oz (or more) should be clear liquids  And  32 oz (or more) should be full liquids (see below for examples)  Liquids should not contain sugar, caffeine, or carbonation   Clear Liquids:  Water or Sugar-free flavored water (i.e. Fruit H2O, Propel)  Decaffeinated coffee or tea (sugar-free)  Crystal Lite, Wyler's Lite, Minute Maid Lite  Sugar-free Jell-O  Bouillon or broth  Sugar-free Popsicle: *Less than 20 calories each; Limit 1 per day   Full Liquids:  Protein Shakes/Drinks + 2 choices per day of other full liquids  Full liquids must be:  No More Than 12 grams of Carbs per serving  No More Than 3 grams of Fat per serving  Strained low-fat cream soup  Non-Fat milk  Fat-free Lactaid Milk  Sugar-free yogurt (Dannon Lite & Fit, [place] yogurt, Oikos Zero)   [place], [place], [place], [place] 03/31/2017 12:20 PM

## 2017-03-31 NOTE — Discharge Instructions (Signed)
How and Where to Give Subcutaneous Enoxaparin Injections Enoxaparin is an injectable medicine. It is used to help prevent blood clots from developing in your veins. Health care providers often use anticoagulants like enoxaparin to prevent clots following surgery. Enoxaparin is also used in combination with other medicines to treat blood clots and heart attacks. If blood clots are left untreated, they can be life threatening. Enoxaparin comes in single-use syringes. You inject enoxaparin through a syringe into your belly (abdomen). You should change the injection site each time you give yourself a shot. Continue the enoxaparin injections as directed by your health care provider. Your health care provider will use blood clotting test results to decide when you can safely stop using enoxaparin injections. If your health care provider prescribes any additional medicines, use the medicines exactly as directed. How do I inject enoxaparin? 1. Wash your hands with soap and water. 2. Clean the selected injection site as directed by your health care provider. 3. Remove the needle cap by pulling it straight off the syringe. 4. When using a prefilled syringe, do not push the air bubble out of the syringe before the injection. The air bubble will help you get all of the medicine out of the syringe. 5. Hold the syringe like a pencil using your writing hand. 6. Use your other hand to pinch and hold an inch of the cleansed skin. 7. Insert the entire needle straight down into the fold of skin. 8. Push the plunger with your thumb until the syringe is empty. 9. Pull the needle straight out of your skin. 10. Enoxaparin injection prefilled syringes and graduated prefilled syringes are available with a system that shields the needle after injection. After you have completed your injection and removed the needle from your skin, firmly push down on the plunger. The protective sleeve will automatically cover the needle and you  will hear a click. The click means the needle is safely covered. 11. Place the syringe in the nearest needle box, also called a sharps container. If you do not have a sharps container, you can use a hard-sided plastic container with a secure lid, such as an empty laundry detergent bottle. What else do I need to know?  Do not use enoxaparin if: ? You have allergies to heparin or pork products. ? You have been diagnosed with a condition called thrombocytopenia.  Do not use the syringe or needle more than one time.  Use medicines only as directed by your health care provider.  Changes in medicines, supplements, diet, and illness can affect your anticoagulation therapy. Be sure to inform your health care provider of any of these changes.  It is important that you tell all of your health care providers and your dentist that you are taking an anticoagulant, especially if you are injured or plan to have any type of procedure.  While on anticoagulants, you will need to have blood tests done routinely as directed by your health care provider.  While using this medicine, avoid physical activities or sports that could result in a fall or cause injury.  Follow up with your laboratory test and health care provider appointments as directed. It is very important to keep your appointments. Not keeping appointments could result in a chronic or permanent injury, pain, or disability.  Before giving your medicine, you should make sure the injection is a clear and colorless or pale yellow solution. If your medicine becomes discolored or if there are particles in the syringe, do not use  it and notify your health care provider.  Keep your medicine safely stored at room temperature. Contact a health care provider if:  You develop any rashes on your skin.  You have large areas of bruising on your skin.  You have any worsening of the condition for which you take Enoxaparin.  You develop a fever. Get help  right away if:  You develop bleeding problems such as: ? Bleeding from the gums or nose that does not stop quickly. ? Vomiting blood or coughing up blood. ? Blood in your urine. ? Blood in your stool, or stool that has a dark, tarry, or coffee grounds appearance. ? A cut that does not stop bleeding within 10 minutes. These symptoms may represent a serious problem that is an emergency. Do not wait to see if the symptoms will go away. Get medical help right away. Call your local emergency services (911 in the [place] Do not drive yourself to the hospital. This information is not intended to replace advice given to you by your health care provider. Make sure you discuss any questions you have with your health care provider. Document Released: 04/24/2004 Document Revised: 02/28/2016 Document Reviewed: 12/08/2013 Elsevier Interactive Patient Education  2017 [place]. Enoxaparin injection What is this medicine? ENOXAPARIN (ee nox a PA rin) is used after knee, hip, or abdominal surgeries to prevent blood clotting. It is also used to treat existing blood clots in the lungs or in the veins. This medicine may be used for other purposes; ask your health care provider or pharmacist if you have questions. COMMON BRAND NAME(S): Lovenox What should I tell my health care provider before I take this medicine? They need to know if you have any of these conditions: -bleeding disorders, hemorrhage, or hemophilia -infection of the heart or heart valves -kidney or liver disease -previous stroke -prosthetic heart valve -recent surgery or delivery of a baby -ulcer in the stomach or intestine, diverticulitis, or other bowel disease -an unusual or allergic reaction to enoxaparin, heparin, pork or pork products, other medicines, foods, dyes, or preservatives -pregnant or trying to get pregnant -breast-feeding How should I use this medicine? This medicine is for injection under the skin. It is usually given by  a health-care professional. You or a family member may be trained on how to give the injections. If you are to give yourself injections, make sure you understand how to use the syringe, measure the dose if necessary, and give the injection. To avoid bruising, do not rub the site where this medicine has been injected. Do not take your medicine more often than directed. Do not stop taking except on the advice of your doctor or health care professional. Make sure you receive a puncture-resistant container to dispose of the needles and syringes once you have finished with them. Do not reuse these items. Return the container to your doctor or health care professional for proper disposal. Talk to your pediatrician regarding the use of this medicine in children. Special care may be needed. Overdosage: If you think you have taken too much of this medicine contact a poison control center or emergency room at once. NOTE: This medicine is only for you. Do not share this medicine with others. What if I miss a dose? If you miss a dose, take it as soon as you can. If it is almost time for your next dose, take only that dose. Do not take double or extra doses. What may interact with this medicine? -aspirin and aspirin-like  medicines -certain medicines that treat or prevent blood clots -dipyridamole -NSAIDs, medicines for pain and inflammation, like ibuprofen or naproxen This list may not describe all possible interactions. Give your health care provider a list of all the medicines, herbs, non-prescription drugs, or dietary supplements you use. Also tell them if you smoke, drink alcohol, or use illegal drugs. Some items may interact with your medicine. What should I watch for while using this medicine? Visit your doctor or health care professional for regular checks on your progress. Your condition will be monitored carefully while you are receiving this medicine. Notify your doctor or health care professional and  seek emergency treatment if you develop breathing problems; changes in vision; chest pain; severe, sudden headache; pain, swelling, warmth in the leg; trouble speaking; sudden numbness or weakness of the face, arm, or leg. These can be signs that your condition has gotten worse. If you are going to have surgery, tell your doctor or health care professional that you are taking this medicine. Do not stop taking this medicine without first talking to your doctor. Be sure to refill your prescription before you run out of medicine. Avoid sports and activities that might cause injury while you are using this medicine. Severe falls or injuries can cause unseen bleeding. Be careful when using sharp tools or knives. Consider using an Copy. Take special care brushing or flossing your teeth. Report any injuries, bruising, or red spots on the skin to your doctor or health care professional. What side effects may I notice from receiving this medicine? Side effects that you should report to your doctor or health care professional as soon as possible: -allergic reactions like skin rash, itching or hives, swelling of the face, lips, or tongue -feeling faint or lightheaded, falls -signs and symptoms of bleeding such as bloody or black, tarry stools; red or dark-brown urine; spitting up blood or brown material that looks like coffee grounds; red spots on the skin; unusual bruising or bleeding from the eye, gums, or nose Side effects that usually do not require medical attention (report to your doctor or health care professional if they continue or are bothersome): -pain, redness, or irritation at site where injected This list may not describe all possible side effects. Call your doctor for medical advice about side effects. You may report side effects to FDA at 1-800-FDA-1088. Where should I keep my medicine? Keep out of the reach of children. Store at room temperature between 15 and 30 degrees C (59 and 86  degrees F). Do not freeze. If your injections have been specially prepared, you may need to store them in the refrigerator. Ask your pharmacist. Throw away any unused medicine after the expiration date. NOTE: This sheet is a summary. It may not cover all possible information. If you have questions about this medicine, talk to your doctor, pharmacist, or health care provider.  2018 Elsevier/Gold Standard (2013-10-25 16:06:21)     GASTRIC BYPASS/SLEEVE  Home Care Instructions   These instructions are to help you care for yourself when you go home.  Call: If you have any problems.  Call [phone] and ask for the surgeon on call  If you need immediate assistance come to the ER at [facility]. Tell the ER staff you are a new post-op gastric bypass or gastric sleeve patient  Signs and symptoms to report:  Severe  vomiting or nausea o If you cannot handle clear liquids for longer than 1 day, call your surgeon  Abdominal pain which  does not get better after taking your pain medication  Fever greater than 100.4  F and chills  Heart rate over 100 beats a minute  Trouble breathing  Chest pain  Redness,  swelling, drainage, or foul odor at incision (surgical) sites  If your incisions open or pull apart  Swelling or pain in calf (lower leg)  Diarrhea (Loose bowel movements that happen often), frequent watery, uncontrolled bowel movements  Constipation, (no bowel movements for 3 days) if this happens: o Take Milk of Magnesia, 2 tablespoons by mouth, 3 times a day for 2 days if needed o Stop taking Milk of Magnesia once you have had a bowel movement o Call your doctor if constipation continues Or o Take Miralax  (instead of Milk of Magnesia) following the label instructions o Stop taking Miralax once you have had a bowel movement o Call your doctor if constipation continues  Anything you think is abnormal for you   Normal side effects after surgery:  Unable to sleep at  night or unable to concentrate  Irritability  Being tearful (crying) or depressed  These are common complaints, possibly related to your anesthesia, stress of surgery, and change in lifestyle, that usually go away a few weeks after surgery. If these feelings continue, call your medical doctor.  Wound Care: You may have surgical glue, steri-strips, or staples over your incisions after surgery  Surgical glue: Looks like clear film over your incisions and will wear off a little at a time  Steri-strips: Adhesive strips of tape over your incisions. You may notice a yellowish color on skin under the steri-strips. This is used to make the steri-strips stick better. Do not pull the steri-strips off - let them fall off  Staples: Staples may be removed before you leave the hospital o If you go home with staples, call [facility] for an appointment with your surgeons nurse to have staples removed 10 days after surgery, [phone]  Showering: You may shower two (2) days after your surgery unless your surgeon tells you differently o Wash gently around incisions with warm soapy water, rinse well, and gently pat dry o If you have a drain (tube from your incision), you may need someone to hold this while you shower o No tub baths until staples are removed and incisions are healed   Medications:  Medications should be liquid or crushed if larger than the size of a dime  Extended release pills (medication that releases a little bit at a time through the  day) should not be crushed  Depending on the size and number of medications you take, you may need to space (take a few throughout the day)/change the time you take your medications so that you do not over-fill your pouch (smaller stomach)  Make sure you follow-up with you primary care physician to make medication changes needed during rapid weight loss and life -style changes  If you have diabetes, follow up with your doctor that  orders your diabetes medication(s) within one week after surgery and check your blood sugar regularly   Do not drive while taking narcotics (pain medications)   Do not take acetaminophen (Tylenol) and Roxicet or Lortab Elixir at the same time since these pain medications contain acetaminophen   Diet:  First 2 Weeks You will see the nutritionist about two (2) weeks after your surgery. The nutritionist will increase the types of foods you can eat if you are handling liquids well:  If you have severe  vomiting or nausea and cannot handle clear liquids lasting longer than 1 day call your surgeon Protein Shake  Drink at least 2 ounces of shake 5-6 times per day  Each serving of protein shakes (usually 8-12 ounces) should have a minimum of: o 15 grams of protein o And no more than 5 grams of carbohydrate  Goal for protein each day: o Men = 80 grams per day o Women = 60 grams per day     Protein powder may be added to fluids such as non-fat milk or Lactaid milk or Soy milk (limit to 35 grams added protein powder per serving)  Hydration  Slowly increase the amount of water and other clear liquids as tolerated (See Acceptable Fluids)  Slowly increase the amount of protein shake as tolerated  Sip fluids slowly and throughout the day  May use sugar substitutes in small amounts (no more than 6-8 packets per day; i.e. Splenda)  Fluid Goal  The first goal is to drink at least 8 ounces of protein shake/drink per day (or as directed by the nutritionist); some examples of protein shakes are Johnson & Johnson, AMR Corporation, EAS Edge HP, and Unjury. - See handout from pre-op Bariatric Education Class: o Slowly increase the amount of protein shake you drink as tolerated o You may find it easier to slowly sip shakes throughout the day o It is important to get your proteins in first  Your fluid goal is to drink 64-100 ounces of fluid daily o It may take a few weeks to build up to this   32  oz. (or more) should be clear liquids And  32 oz. (or more) should be full liquids (see below for examples)  Liquids should not contain sugar, caffeine, or carbonation  Clear Liquids:  Water of Sugar-free flavored water (i.e. Fruit HO, Propel)  Decaffeinated coffee or tea (sugar-free)  Crystal lite, Wylers Lite, Minute Maid Lite  Sugar-free Jell-O  Bouillon or broth  Sugar-free Popsicle:    - Less than 20 calories each; Limit 1 per day  Full Liquids:                   Protein Shakes/Drinks + 2 choices per day of other full liquids  Full liquids must be: o No More Than 12 grams of Carbs per serving o No More Than 3 grams of Fat per serving  Strained low-fat cream soup  Non-Fat milk  Fat-free Lactaid Milk  Sugar-free yogurt (Dannon Lite & Fit, [place] yogurt)    Vitamins and Minerals  Start 1 day after surgery unless otherwise directed by your surgeon  2 Chewable Multivitamin / Multimineral Supplement with iron   Chewable Calcium Citrate with Vitamin D-3 (Example: 3 Chewable Calcium  Plus 600 with Vitamin D-3) o Take 500 mg three (3) times a day for a total of 1500 mg each day o Do not take all 3 doses of calcium at one time as it may cause constipation, and you can only absorb 500 mg at a time o Do not mix multivitamins containing iron with calcium supplements;  take 2 hours apart  Menstruating women and those at risk for anemia ( a blood disease that causes weakness) may need extra iron o Talk to your doctor to see if you need more iron  If you need extra iron: Total daily Iron recommendation (including Vitamins) is 50 to 100 mg Iron/day  Do not stop taking or change any vitamins or minerals until you talk to your  nutritionist or surgeon  Your nutritionist and/or surgeon must approve all vitamin and mineral supplements   Activity and Exercise: It is important to continue walking at home. Limit your physical activity as instructed by your doctor. During this  time, use these guidelines:  Do not lift anything greater than ten  (10) pounds for at least two (2) weeks  Do not go back to work or drive until [employer], production says you can  You may have sex when you feel comfortable o It is VERY important for female patients to use a reliable birth control method; fertility often increase after surgery o Do not get pregnant for at least 18 months  Start exercising as soon as your doctor tells you that you can o Make sure your doctor approves any physical activity  Start with a simple walking program  Walk 5-15 minutes each day, 7 days per week  Slowly increase until you are walking 30-45 minutes per day  Consider joining our [place] program. [phone] or email [email or web address]   Special Instructions Things to remember:  Use your CPAP when sleeping if this applies to you  Consider buying a medical alert bracelet that says you had lap-band surgery     You will likely have your first fill (fluid added to your band) 6 - 8 weeks after surgery  [facility] has a free Bariatric Surgery Support Group that meets monthly, the 3rd Thursday, [place]. You can see classes online at [email or web address]  It is very important to keep all follow up appointments with your surgeon, nutritionist, primary care physician, and behavioral health practitioner o After the first year, please follow up with your bariatric surgeon and nutritionist at least once a year in order to maintain best weight loss results                    [facility]:  [place]: [phone]               Bariatric Nurse Coordinator: [phone]  Gastric Bypass/Sleeve Home Care Instructions  Rev. 08/2012                                                         Reviewed and Endorsed                                                    by [facility] Patient  Education Committee, Jan, 2014

## 2017-03-31 NOTE — Progress Notes (Signed)
Patient being discharged home with spouse.  Discharge instructions reviewed with patient and spouse.  Patient verbalized understanding.  Nicanor Mendolia RN

## 2017-04-02 ENCOUNTER — Telehealth (HOSPITAL_COMMUNITY): Payer: Self-pay

## 2017-04-02 NOTE — Telephone Encounter (Signed)
Left voice message for patient with contact information regarding the below questions.  Made discharge phone call to patient   . Asking the following questions.    1. Do you have someone to care for you now that you are home?   2. Are you having pain now that is not relieved by your pain medication?   3. Are you able to drink the recommended daily amount of fluids (48 ounces minimum/day) and protein (60-80 grams/day) as prescribed by the dietitian or nutritional counselor?   4. Are you taking the vitamins and minerals as prescribed?   5. Do you have the "on call" number to contact your surgeon if you have a problem or question?   6. Are your incisions free of redness, swelling or drainage? (If steri strips, address that these can fall off, shower as tolerated)  7. Have your bowels moved since your surgery?  If not, are you passing gas?   8. Are you up and walking 3-4 times per day?   9. Were you provided your discharge medications before your surgery or before you were discharged from the hospital and are you taking them without problem?

## 2017-04-06 ENCOUNTER — Telehealth (HOSPITAL_COMMUNITY): Payer: Self-pay

## 2017-04-06 NOTE — Discharge Summary (Signed)
Physician Discharge Summary  [name] GYF:[id number] DOB: May 28, 1966 DOA: 03/30/2017  PCP: Gregor Hams, MD  Admit date: 03/30/2017 Discharge date: 03/31/2017  Recommendations for Outpatient Follow-up:  1.   Follow-up Information    Greer Pickerel, MD. Go on 04/23/2017.   Specialty:  General Surgery Why:  at [facility] information: [address] [place] [address] [phone]        Greer Pickerel, MD Follow up.   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 Please see operative note. Preoperatively the patient was given 5000 units of subcutaneous heparin for DVT prophylaxis. Postoperative prophylactic Lovenox dosing was started on the evening of postoperative day 0. On the evening of postoperative day 0, the patient was started on water and ice chips. On postoperative day 1 the patient had no fever or tachycardia and was tolerating water in their diet was gradually advanced throughout the day. The patient was ambulating without difficulty. Their vital signs are stable without fever or tachycardia. Their hemoglobin had remained stable.  The patient had received discharge instructions and counseling. They were deemed stable for discharge and had met discharge criteria I had contacted her pain  management clinic and discussed her discharge pain medication regimen with them. They stated that she could continue her normal chronic pain medication but I could give acute pain medication as deemed necessary. I did discuss with the patient that she should not need to take all of her night time chronic medication at the same time. Because of her father's blood clot history I did send her out on Lovenox once a day prophylactically. We will switch her out to oral antiplatelet when she is about 2 weeks out.  BP (!) 152/80 (BP Location: Left Arm)   Pulse 78   Temp 98.5 F (36.9 C) (Oral)   Resp 18   Ht _0  (1.702 m)   Wt 105.3 kg (232 lb 2 oz)   SpO2 99%   BMI 36.36 kg/m   Gen: alert, NAD, non-toxic appearing Pupils: equal, no scleral icterus Pulm: Lungs clear to auscultation, symmetric chest rise CV: regular rate and rhythm Abd: soft, mild approp tender, nondistended.. No cellulitis. No incisional hernia Ext: no edema, no calf tenderness Skin: no rash, no jaundice    Discharge Instructions  Discharge Instructions    Ambulate hourly while awake    Complete by:  As directed    Call MD for:  difficulty breathing, headache or visual disturbances    Complete by:  As directed    Call MD for:  persistant dizziness or light-headedness    Complete by:  As directed    Call MD for:  persistant nausea and vomiting    Complete by:  As directed    Call MD for:  redness, tenderness, or signs of  infection (pain, swelling, redness, odor or green/yellow discharge around incision site)    Complete by:  As directed    Call MD for:  severe uncontrolled pain    Complete by:  As directed    Call MD for:  temperature >101 F    Complete by:  As directed    Diet bariatric full liquid    Complete by:  As directed    Discharge instructions    Complete by:  As directed    See bariatric discharge instructions   Incentive spirometry    Complete by:  As directed    Perform hourly while awake      Allergies as of 03/31/2017      Reactions   Gabapentin Nausea And Vomiting, Anaphylaxis   Pineapple Shortness Of Breath, Swelling   Throat swells   Shellfish-derived Products Anaphylaxis   Erythromycin Swelling   Latex Rash   Hives   Pregabalin Swelling   Duloxetine Other (See Comments)   Confusion/dizziness   Pollen Extract Other (See Comments)   Stuffy  Nose      Medication List    STOP taking these medications   AMITIZA 24 MCG capsule Generic drug:  lubiprostone   meloxicam 15 MG tablet Commonly known as:  MOBIC   metFORMIN 1000 MG tablet Commonly known as:  GLUCOPHAGE   omeprazole 20 MG capsule Commonly known as:  PRILOSEC     TAKE these medications   ALPRAZolam 1 MG tablet Commonly known as:  XANAX Take 1 mg by mouth daily as needed (for anxiety).   budesonide-formoterol 160-4.5 MCG/ACT inhaler Commonly known as:  SYMBICORT Inhale 2 puffs into the lungs 2 (two) times daily.   buPROPion 150 MG 24 hr tablet Commonly known as:  WELLBUTRIN XL Take 150 mg by mouth daily with breakfast. Total daily dose=436m   buPROPion 300 MG 24 hr tablet Commonly known as:  WELLBUTRIN XL Take 300 mg by mouth daily with breakfast. Total daily dose=4530m  CELEBRATE CALCIUM PLUS 500 500-333 MG-UNIT Chew Generic drug:  Calcium Citrate-Vitamin D Chew 1 tablet by mouth 3 (three) times daily.   CELEBRATE MULTI-COMPLETE 3673hew Chew 1 tablet by mouth 2 (two) times daily.   clonazePAM 1 MG tablet Commonly known as:  KLONOPIN Take 3 mg by mouth at bedtime.   cyclobenzaprine 10 MG tablet Commonly known as:  FLEXERIL Take 10 mg by mouth 3 (three) times daily as needed for muscle spasms.   diclofenac sodium 1 % Gel Commonly known as:  VOLTAREN Apply 4 g topically 4 (four) times daily. To affected joint. What changed:  when to take this  reasons to take this  additional instructions   enoxaparin 40 MG/0.4ML injection Commonly known as:  LOVENOX Inject 0.4 mLs (40  mg total) into the skin daily.   escitalopram 10 MG tablet Commonly known as:  LEXAPRO Take 10 mg by mouth daily with breakfast.   fentaNYL 50 MCG/HR Commonly known as:  DURAGESIC - dosed mcg/hr Place 50 mcg onto the skin every 3 (three) days.   fluticasone 50 MCG/ACT nasal spray Commonly known as:  FLONASE Place 1 spray into the nose daily as needed (for allergies.).   hydrochlorothiazide 12.5 MG capsule Commonly known as:  MICROZIDE Take 1 capsule (12.5 mg total) by mouth daily. Notes to patient:  Monitor Blood Pressure Daily and keep a log for primary care physician.  Monitor for symptoms of dehydration.  You may need to make changes to your medications with rapid  weight loss.     HYDROmorphone 2 MG tablet Commonly known as:  DILAUDID Take 2 mg by mouth 4 (four) times daily.   MOVANTIK 25 MG Tabs tablet Generic drug:  naloxegol oxalate Take 25 mg by mouth at bedtime.   nortriptyline 75 MG capsule Commonly known as:  PAMELOR Take 150 mg by mouth at bedtime.   oxyCODONE 5 MG/5ML solution Commonly known as:  ROXICODONE Take 5 mLs (5 mg total) by mouth every 4 (four) hours as needed for moderate pain or severe pain.   perphenazine 4 MG tablet Commonly known as:  TRILAFON Take 4 mg by mouth daily as needed (for headaches.).   PROAIR HFA 108 (90 Base) MCG/ACT inhaler Generic drug:  albuterol Inhale 2 puffs into the lungs every 4 (four) hours as needed for wheezing or shortness of breath. What changed:  Another medication with the same name was removed. Continue taking this medication, and follow the directions you see here.   traZODone 100 MG tablet Commonly known as:  DESYREL Take 300 mg by mouth at bedtime.   valACYclovir 1000 MG tablet Commonly known as:  VALTREX Take 1 tablet (1,000 mg total) by mouth 2 (two) times daily.            Discharge Care Instructions        Start     Ordered   03/31/17 0000  oxyCODONE (ROXICODONE) 5 MG/5ML solution  Every 4  hours PRN     03/31/17 1244   03/31/17 0000  enoxaparin (LOVENOX) 40 MG/0.4ML injection  Every 24 hours     03/31/17 1507   03/31/17 0000  Discharge instructions    Comments:  See bariatric discharge instructions   03/31/17 1507   03/31/17 0000  Diet bariatric full liquid     03/31/17 1507   03/31/17 0000  Ambulate hourly while awake     03/31/17 1507   03/31/17 0000  Incentive spirometry    Comments:  Perform hourly while awake   03/31/17 1507   03/31/17 0000  Call MD for:  temperature >101 F     03/31/17 1507   03/31/17 0000  Call MD for:  persistant nausea and vomiting     03/31/17 1507   03/31/17 0000  Call MD for:  severe uncontrolled pain     03/31/17 1507   03/31/17 0000  Call MD for:  redness, tenderness, or signs of infection (pain, swelling, redness, odor or green/yellow discharge around incision site)     03/31/17 1507   03/31/17 0000  Call MD for:  difficulty breathing, headache or visual disturbances     03/31/17 1507   03/31/17 0000  Call MD for:  persistant dizziness or light-headedness     03/31/17 1507     Follow-up Information    Greer Pickerel, MD. Go on 04/23/2017.   Specialty:  General Surgery Why:  at [facility] information: [address] [place] [address] [phone]        Greer Pickerel, MD Follow up.   Specialty:  General Surgery Contact information: [place] [address] [phone]            The results of significant diagnostics from this hospitalization (including imaging, microbiology, ancillary and laboratory) are listed below for reference.    Significant Diagnostic Studies: No results found.  Labs: Basic Metabolic Panel:  Recent Labs Lab 03/31/17 0514  NA 139  K 4.0  CL 101  CO2  29  GLUCOSE 102*  BUN 7  CREATININE 0.82  CALCIUM 9.3   Liver Function Tests:  Recent Labs Lab 03/31/17 0514  AST 26  ALT 23  ALKPHOS 72  BILITOT 0.5  PROT 7.4  ALBUMIN 4.1     CBC:  Recent Labs Lab 03/30/17 1459 03/31/17 0514  WBC  --  12.0*  NEUTROABS  --  8.1*  HGB 12.7 12.6  HCT 39.1 39.7  MCV  --  82.4  PLT  --  309    CBG:  Recent Labs Lab 03/30/17 0909 03/30/17 1325  GLUCAP 106* 154*    Principal Problem:   Morbid obesity ([place]) Active Problems:   HTN (hypertension)   Pre-diabetes   Asthma   Bipolar disorder in partial remission ([place])   Esophageal reflux   Dyslipidemia   Arthritis of right knee   Time coordinating discharge: 15 min  Signed:  Gayland Curry, MD [facility], [place] 04/06/2017, 7:19 AM

## 2017-04-06 NOTE — Telephone Encounter (Signed)
Made discharge phone call to patient. Asking the following questions.    1. Do you have someone to care for you now that you are home?  independent 2. Are you having pain now that is not relieved by your pain medication?  no 3. Are you able to drink the recommended daily amount of fluids (48 ounces minimum/day) and protein (60-80 grams/day) as prescribed by the dietitian or nutritional counselor?  60 grams of protein and over 48 ounces of fluid daily 4. Are you taking the vitamins and minerals as prescribed?  yes 5. Do you have the "on call" number to contact your surgeon if you have a problem or question?  yes 6. Are your incisions free of redness, swelling or drainage? (If steri strips, address that these can fall off, shower as tolerated) yes 7. Have your bowels moved since your surgery?  If not, are you passing gas?  yes 8. Are you up and walking 3-4 times per day? yes  9. Were you provided your discharge medications before your surgery or before you were discharged from the hospital and are you taking them without problem?  yes

## 2017-04-13 ENCOUNTER — Telehealth: Payer: Self-pay | Admitting: Hematology and Oncology

## 2017-04-13 ENCOUNTER — Encounter: Payer: Self-pay | Admitting: Hematology and Oncology

## 2017-04-13 ENCOUNTER — Ambulatory Visit (HOSPITAL_BASED_OUTPATIENT_CLINIC_OR_DEPARTMENT_OTHER): Payer: Medicare Other | Admitting: Hematology and Oncology

## 2017-04-13 VITALS — BP 107/60 | HR 92 | Temp 98.2°F | Resp 18 | Ht 67.0 in | Wt 226.9 lb

## 2017-04-13 DIAGNOSIS — Z7901 Long term (current) use of anticoagulants: Secondary | ICD-10-CM

## 2017-04-13 DIAGNOSIS — Z832 Family history of diseases of the blood and blood-forming organs and certain disorders involving the immune mechanism: Secondary | ICD-10-CM

## 2017-04-13 DIAGNOSIS — Z9884 Bariatric surgery status: Secondary | ICD-10-CM

## 2017-04-13 DIAGNOSIS — Z8249 Family history of ischemic heart disease and other diseases of the circulatory system: Secondary | ICD-10-CM

## 2017-04-13 DIAGNOSIS — M7989 Other specified soft tissue disorders: Secondary | ICD-10-CM

## 2017-04-13 NOTE — Telephone Encounter (Signed)
Scheduled appt per 10/8 los - Gave patient AVS and calender per los.  

## 2017-04-14 ENCOUNTER — Encounter: Payer: Medicare Other | Attending: General Surgery | Admitting: Registered"

## 2017-04-14 DIAGNOSIS — G4733 Obstructive sleep apnea (adult) (pediatric): Secondary | ICD-10-CM | POA: Insufficient documentation

## 2017-04-14 DIAGNOSIS — Z713 Dietary counseling and surveillance: Secondary | ICD-10-CM | POA: Insufficient documentation

## 2017-04-14 DIAGNOSIS — E669 Obesity, unspecified: Secondary | ICD-10-CM

## 2017-04-14 DIAGNOSIS — Z6841 Body Mass Index (BMI) 40.0 and over, adult: Secondary | ICD-10-CM | POA: Diagnosis not present

## 2017-04-15 NOTE — Assessment & Plan Note (Signed)
51 y.o. female with history of severe thrombophilia/thromboembolic disease in her father, but without any other family history or personal history of confirmed thrombosis. Evaluation requested in anticipation of undergoing bariatric surgery. Due to history of recurrent swelling of the right lower extremity we have obtained a Doppler ultrasounds which revealed no evidence of active deep vein thrombosis. Additionally, we have obtained hemophilia panel including factor V Leiden, prothrombin gene mutation, etc. testing is completely negative for identifiable prothrombotic condition at this point in time.  Patient is currently receiving prophylactic anticoagulation after undergoing laparoscopic sleeve gastrectomy for weight loss as we have previously recommended. We agree with current anticoagulation plan.  Recommendations: --Observation --RTC 80yr with clinic visit for history update. Please refer patient back to [place] if she is planned for any surgical intervention or procedure that is likely to increase her risk of thrombosis for updated recommendations.

## 2017-04-15 NOTE — Progress Notes (Signed)
[facility] Follow-up Visit:  Assessment: Family history of DVT 51 y.o. female with history of severe thrombophilia/thromboembolic disease in her father, but without any other family history or personal history of confirmed thrombosis. Evaluation requested in anticipation of undergoing bariatric surgery. Due to history of recurrent swelling of the right lower extremity we have obtained a Doppler ultrasounds which revealed no evidence of active deep vein thrombosis. Additionally, we have obtained hemophilia panel including factor V Leiden, prothrombin gene mutation, etc. testing is completely negative for identifiable prothrombotic condition at this point in time.  Patient is currently receiving prophylactic anticoagulation after undergoing laparoscopic sleeve gastrectomy for weight loss as we have previously recommended. We agree with current anticoagulation plan.  Recommendations: --Observation --RTC 69yrwith clinic visit for history update. Please refer patient back to [place] is planned for any surgical intervention or procedure that is likely to increase her risk of thrombosis for updated recommendations.    Voice recognition software was used and creation of this note. Despite my best effort at editing the text, some misspelling/errors may have occurred.  No orders of the defined types were placed in this encounter.   All questions were answered.  . The patient knows to call the clinic with any problems, questions or concerns.  This note was electronically signed.    History of Presenting Illness [name] MONCAYOis a 51y.o.  female [place] of recurrent venous thromboembolism. Patient has no personal history of previous deep vein thrombosis. She is being considered to undergo weight loss surgery and her surgeon,Dr ELurline Delrequested our opinion regarding the need for prophylactic anticoagulation following his  surgery. Patient's family history significant for father who has had multiple events including deep vein thrombosis, strokes with at least in the events reported by the family. Patient has only 1 sibling who is her half-sister with the same father who has had no personal thrombotic events either. Patient knows of no thrombotic history and the grandparents. Patient does have episodic pain and swelling in the right lower extremity that has occurred since the patient had surgery on the right knee. Otherwise, she denies any swelling in the legs, chest pain, cough, hemoptysis. She denies any new neurological, respiratory, gastrointestinal, or genitourinary symptoms.  Since last visit to the clinic, patient underwent Dopplers of the right lower extremity that demonstrated no evidence of deep vein thrombosis. Panel of tests to evaluate for hypercoagulable state returned back negative for any hereditary or identifiable current acquired thrombotic risk. Patient underwent planned laparoscopic sleeve gastrectomy on 03/30/17 with pathology demonstrating mild gastritis, fundic gland polyps, and no evidence of H. pylori infection. She was discharged on Lovenox apparent with plans to switch to Rivaroxaban (Xarelto) 10 mg daily as previously recommended by our service.  At the present time, patient denies any swelling in the lower extremities, chest pain, or shortness of breath. Her activity level is improved. She does have some abdominal discomfort consistent with a surgery, but otherwise reports doing quite well.  Medical History: Past Medical History:  Diagnosis Date  . Allergy   . Anxiety   . Arthritis   . Asthma    History of Asthma  . Bipolar disorder ([place]   . Chest pain, atypical 11/30/2012  . Depression   . Diabetes mellitus without complication ([place]    type 2  . GERD (gastroesophageal reflux disease)   . H/O degenerative disc disease    L4-L5, L5-S1  .  Hyperglycemia    Postoperative hyperglycemia   . Hypertension   . 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 Obesity 07/14/2012  . OSA (obstructive sleep apnea)    mild  . Pneumonia   . Polycystic disease, ovaries   . Postoperative anemia    2018 - while dieting  hx of  . Reflux   . Sinus tachycardia 07/14/2012   Pt says HR > 100 is consistent    Surgical History: Past Surgical History:  Procedure Laterality Date  . FOOT SURGERY     Right plantar facsiitis scrape  . KNEE SURGERY Bilateral 2007  . LAPAROSCOPIC GASTRIC SLEEVE RESECTION N/A 03/30/2017   Procedure: LAPAROSCOPIC GASTRIC SLEEVE RESECTION, UPPER ENDO;  Surgeon: Greer Pickerel, MD;  Location: [facility];  Service: General;  Laterality: N/A;  . NASAL SINUS SURGERY  2004  . OVARIAN CYST REMOVAL  1998   A cyst on the fallopian tube removed  . SPINAL FUSION    . TONSILLECTOMY  1996    Family History: Family History  Problem Relation Age of Onset  . Cancer Father        Leukemia  . Diabetes Father   . Diabetes Mother   . Cancer Maternal Grandmother        Stomach  . Cancer Paternal Grandmother   . Cancer Paternal Aunt   . Diabetes Paternal Aunt   . Cancer Paternal Uncle   . Cancer Paternal Aunt     Social History: Social History   Social History  . Marital status: Divorced    Spouse name: N/A  . Number of children: 0  . Years of education: N/A   Occupational History  . disabled Unemployed   Social History Main Topics  . Smoking status: Never Smoker  . Smokeless tobacco: Never Used  . Alcohol use No  . Drug use: No  . Sexual activity: No   Other Topics Concern  . Not on file   Social History Narrative  . No narrative on file    Allergies: Allergies  Allergen Reactions  . Gabapentin Nausea And Vomiting and Anaphylaxis  . Pineapple Shortness Of Breath and Swelling    Throat swells  . Shellfish-Derived Products Anaphylaxis  . Erythromycin Swelling  . Latex Rash    Hives  . Pregabalin Swelling  .  Duloxetine Other (See Comments)    Confusion/dizziness  . Pollen Extract Other (See Comments)    Stuffy  Nose    Medications:  Current Outpatient Prescriptions  Medication Sig Dispense Refill  . albuterol (PROAIR HFA) 108 (90 BASE) MCG/ACT inhaler Inhale 2 puffs into the lungs every 4 (four) hours as needed for wheezing or shortness of breath.    . ALPRAZolam (XANAX) 1 MG tablet Take 1 mg by mouth daily as needed (for anxiety).     . budesonide-formoterol (SYMBICORT) 160-4.5 MCG/ACT inhaler Inhale 2 puffs into the lungs 2 (two) times daily. 3 Inhaler 4  . buPROPion (WELLBUTRIN XL) 150 MG 24 hr tablet Take 150 mg by mouth daily with breakfast. Total daily dose=471m    . buPROPion (WELLBUTRIN XL) 300 MG 24 hr tablet Take 300 mg by mouth daily with breakfast. Total daily dose=4534m   . Calcium Citrate-Vitamin D (CELEBRATE CALCIUM PLUS 500) 500-333 MG-UNIT CHEW Chew 1 tablet by mouth 3 (three) times daily.    . clonazePAM (KLONOPIN) 1 MG tablet Take 3 mg by mouth at bedtime.   5  . cyclobenzaprine (  FLEXERIL) 10 MG tablet Take 10 mg by mouth 3 (three) times daily as needed for muscle spasms.     . diclofenac sodium (VOLTAREN) 1 % GEL Apply 4 g topically 4 (four) times daily. To affected joint. (Patient taking differently: Apply 4 g topically 4 (four) times daily as needed (for pain. (DO NOT USE IN COMBINATION WITH MELOXICAM)). To affected joint.) 100 g 11  . enoxaparin (LOVENOX) 40 MG/0.4ML injection Inject 0.4 mLs (40 mg total) into the skin daily. 14 Syringe 0  . escitalopram (LEXAPRO) 10 MG tablet Take 10 mg by mouth daily with breakfast.     . fentaNYL (DURAGESIC - DOSED MCG/HR) 50 MCG/HR Place 50 mcg onto the skin every 3 (three) days.     . fluticasone (FLONASE) 50 MCG/ACT nasal spray Place 1 spray into the nose daily as needed (for allergies.).     Marland Kitchen hydrochlorothiazide (MICROZIDE) 12.5 MG capsule Take 1 capsule (12.5 mg total) by mouth daily. 90 capsule 3  . HYDROmorphone (DILAUDID) 2 MG  tablet Take 2 mg by mouth 4 (four) times daily.     Marland Kitchen MOVANTIK 25 MG TABS tablet Take 25 mg by mouth at bedtime.     . Multiple Vitamins-Minerals (CELEBRATE MULTI-COMPLETE 36) CHEW Chew 1 tablet by mouth 2 (two) times daily.    . nortriptyline (PAMELOR) 75 MG capsule Take 150 mg by mouth at bedtime.     Marland Kitchen perphenazine (TRILAFON) 4 MG tablet Take 4 mg by mouth daily as needed (for headaches.).     Marland Kitchen traZODone (DESYREL) 100 MG tablet Take 300 mg by mouth at bedtime.     . valACYclovir (VALTREX) 1000 MG tablet Take 1 tablet (1,000 mg total) by mouth 2 (two) times daily. 4 tablet 12  . oxyCODONE (ROXICODONE) 5 MG/5ML solution Take 5 mLs (5 mg total) by mouth every 4 (four) hours as needed for moderate pain or severe pain. (Patient not taking: Reported on 04/13/2017) 75 mL 0   No current facility-administered medications for this visit.     Review of Systems: Review of Systems  All other systems reviewed and are negative.    PHYSICAL EXAMINATION Blood pressure 107/60, pulse 92, temperature 98.2 F (36.8 C), temperature source Oral, resp. rate 18, height _0  (1.702 m), weight 226 lb 14.4 oz (102.9 kg), SpO2 95 %.  ECOG PERFORMANCE STATUS: 1 - Symptomatic but completely ambulatory  Physical Exam  Constitutional: She is oriented to person, place, and time and well-developed, well-nourished, and in no distress.  HENT:  Head: Normocephalic and atraumatic.  Mouth/Throat: Oropharynx is clear and moist. No oropharyngeal exudate.  Eyes: Pupils are equal, round, and reactive to light. Conjunctivae and EOM are normal. No scleral icterus.  Neck: Normal range of motion. Neck supple. No thyromegaly present.  Cardiovascular: Normal rate, regular rhythm and intact distal pulses.   No murmur heard. Pulmonary/Chest: Breath sounds normal. No respiratory distress. She has no wheezes.  Abdominal: Soft. Bowel sounds are normal. She exhibits no mass. There is tenderness. There is no rebound.  Well-healing  abdominal incisions consistent with recent surgery. No dehiscence.  Musculoskeletal: Normal range of motion. She exhibits no edema or tenderness.  Lymphadenopathy:    She has no cervical adenopathy.  Neurological: She is alert and oriented to person, place, and time. She has normal reflexes. No cranial nerve deficit. GCS score is 15.  Skin: Skin is warm and dry. No rash noted. She is not diaphoretic. No erythema.     LABORATORY DATA: I  have personally reviewed the data as listed: No visits with results within 1 Week(s) from this visit.  Latest known visit with results is:  Admission on 03/30/2017, Discharged on 03/31/2017  Component Date Value Ref Range Status  . Glucose-Capillary 03/30/2017 106* 65 - 99 mg/dL Final  . Hemoglobin 03/30/2017 12.7  12.0 - 15.0 g/dL Final  . HCT 03/30/2017 39.1  36.0 - 46.0 % Final  . Glucose-Capillary 03/30/2017 154* 65 - 99 mg/dL Final  . Comment 1 03/30/2017 Notify RN   Final  . Comment 2 03/30/2017 Document in Chart   Final  . WBC 03/31/2017 12.0* 4.0 - 10.5 K/uL Final  . RBC 03/31/2017 4.82  3.87 - 5.11 MIL/uL Final  . Hemoglobin 03/31/2017 12.6  12.0 - 15.0 g/dL Final  . HCT 03/31/2017 39.7  36.0 - 46.0 % Final  . MCV 03/31/2017 82.4  78.0 - 100.0 fL Final  . MCH 03/31/2017 26.1  26.0 - 34.0 pg Final  . MCHC 03/31/2017 31.7  30.0 - 36.0 g/dL Final  . RDW 03/31/2017 15.6* 11.5 - 15.5 % Final  . Platelets 03/31/2017 309  150 - 400 K/uL Final  . Neutrophils Relative % 03/31/2017 68  % Final  . Neutro Abs 03/31/2017 8.1* 1.7 - 7.7 K/uL Final  . Lymphocytes Relative 03/31/2017 22  % Final  . Lymphs Abs 03/31/2017 2.6  0.7 - 4.0 K/uL Final  . Monocytes Relative 03/31/2017 9  % Final  . Monocytes Absolute 03/31/2017 1.1* 0.1 - 1.0 K/uL Final  . Eosinophils Relative 03/31/2017 1  % Final  . Eosinophils Absolute 03/31/2017 0.1  0.0 - 0.7 K/uL Final  . Basophils Relative 03/31/2017 0  % Final  . Basophils Absolute 03/31/2017 0.0  0.0 - 0.1 K/uL Final   . Sodium 03/31/2017 139  135 - 145 mmol/L Final  . Potassium 03/31/2017 4.0  3.5 - 5.1 mmol/L Final  . Chloride 03/31/2017 101  101 - 111 mmol/L Final  . CO2 03/31/2017 29  22 - 32 mmol/L Final  . Glucose, Bld 03/31/2017 102* 65 - 99 mg/dL Final  . BUN 03/31/2017 7  6 - 20 mg/dL Final  . Creatinine, Ser 03/31/2017 0.82  0.44 - 1.00 mg/dL Final  . Calcium 03/31/2017 9.3  8.9 - 10.3 mg/dL Final  . Total Protein 03/31/2017 7.4  6.5 - 8.1 g/dL Final  . Albumin 03/31/2017 4.1  3.5 - 5.0 g/dL Final  . AST 03/31/2017 26  15 - 41 U/L Final  . ALT 03/31/2017 23  14 - 54 U/L Final  . Alkaline Phosphatase 03/31/2017 72  38 - 126 U/L Final  . Total Bilirubin 03/31/2017 0.5  0.3 - 1.2 mg/dL Final  . GFR calc non Af Amer 03/31/2017 >60  >60 mL/min Final  . GFR calc Af Amer 03/31/2017 >60  >60 mL/min Final   Comment: (NOTE) The eGFR has been calculated using the CKD EPI equation. This calculation has not been validated in all clinical situations. eGFR's persistently <60 mL/min signify possible Chronic Kidney Disease.   Georgiann Hahn gap 03/31/2017 9  5 - 15 Final       Ardath Sax, MD

## 2017-04-16 NOTE — Progress Notes (Signed)
Bariatric Class:  Appt start time: 1530 end time:  1630.  2 Week Post-Operative Nutrition Class  Patient was seen on 04/15/2017 for Post-Operative Nutrition education at [facility].   Surgery date: 03/30/2017 Surgery type: Sleeve gastrectomy Start weight at [facility]: 252.8 Weight today: 226.2 Weight change: 26.6 lbs loss  TANITA  BODY COMP RESULTS  04/15/2017   BMI (kg/m^2) 35.4   Fat Mass (lbs) 103.6   Fat Free Mass (lbs) 122.6   Total Body Water (lbs) 88.8   The following the learning objectives were met by the patient during this course:  Identifies Phase 3A (Soft, High Proteins) Dietary Goals and will begin from 2 weeks post-operatively to 2 months post-operatively  Identifies appropriate sources of fluids and proteins   States protein recommendations and appropriate sources post-operatively  Identifies the need for appropriate texture modifications, mastication, and bite sizes when consuming solids  Identifies appropriate multivitamin and calcium sources post-operatively  Describes the need for physical activity post-operatively and will follow MD recommendations  States when to call healthcare provider regarding medication questions or post-operative complications  Handouts given during class include:  Phase 3A: Soft, High Protein Diet Handout  Follow-Up Plan: Patient will follow-up at [facility] in 6 weeks for 2 month post-op nutrition visit for diet advancement per MD.

## 2017-05-26 ENCOUNTER — Encounter: Payer: Self-pay | Admitting: Registered"

## 2017-05-26 ENCOUNTER — Encounter: Payer: Medicare Other | Attending: General Surgery | Admitting: Registered"

## 2017-05-26 DIAGNOSIS — Z6841 Body Mass Index (BMI) 40.0 and over, adult: Secondary | ICD-10-CM | POA: Diagnosis not present

## 2017-05-26 DIAGNOSIS — E669 Obesity, unspecified: Secondary | ICD-10-CM

## 2017-05-26 DIAGNOSIS — Z713 Dietary counseling and surveillance: Secondary | ICD-10-CM | POA: Insufficient documentation

## 2017-05-26 DIAGNOSIS — G4733 Obstructive sleep apnea (adult) (pediatric): Secondary | ICD-10-CM | POA: Insufficient documentation

## 2017-05-26 NOTE — Progress Notes (Signed)
Follow-up visit:  8 Weeks Post-Operative Sleeve gastrectomy Surgery  Medical Nutrition Therapy:  Appt start time: 4:35 end time:  5:18.  Primary concerns today: Post-operative Bariatric Surgery Nutrition Management.  Non scale victories: decreased pant size 20 to 16, people complimenting on weight loss  Surgery date: 03/30/2017 Surgery type: Sleeve gastrectomy Start weight at [facility]: 252.8 Weight today: 218.8 lbs Weight change: 7.4 lbs loss from 226.2 on 04/15/2017 Total weight lost: 34 lbs Weight loss goal: improve asthma, improve life, live happier and healthier life   TANITA  BODY COMP RESULTS  04/15/2017 05/26/2017   BMI (kg/m^2) 35.4 34.3   Fat Mass (lbs) 103.6 100.4   Fat Free Mass (lbs) 122.6 118.4   Total Body Water (lbs) 88.8 85.6    Pt states she has omitted certain flavors of premier protein; still enjoys chocolate, strawberries and cream, caramel, and peaches and cream flavors. Pt states she has a stabbing pain on her left side after she eats. Pt states she is still working on chewing at least 30 times per bite. Pt states she ate a [place] sandwich with lettuce and tomato from [employer]; hurt going down. Pt states nothing appeals to her and she does not have a taste for anything. Pt states she has been stressed recently with moving from larger house to smaller house. Pt states she has tried a few sips of carbonated drinks; burns all the way down.    Preferred Learning Style:   No preference indicated   Learning Readiness:   Contemplating  Ready  Change in progress  24-hr recall: B (AM): protein shake (30g) Snk (AM): protein pack (15g)  L (PM): protein shake (30g) Snk (PM): beef jerky (7g) D (PM): egg (6g) and cheese (6g) omelette Snk (PM): none  Fluid intake: water, Propel, Body Armour, decaf tea; 64+ ounces Estimated total protein intake: ~94 grams   Medications: See list; no longer taking metformin and blood pressure medications Supplementation: 2 Celebrate  + 3 Celebrate calcium supplements  Using straws: no Drinking while eating: sometimes Having you been chewing well: sometimes Chewing/swallowing difficulties: no Changes in vision: no Changes to mood/headaches: no Hair loss/Changes to skin/Changes to nails: yes-trying a new shampoo with biotin and collagen, no no Any difficulty focusing or concentrating: no Sweating: yes Dizziness/Lightheaded: yes Palpitations: no  Carbonated beverages: tried a few times N/V/D/C/GAS: no, no, no, yes, yes Abdominal Pain: yes, left side after eating Dumping syndrome: no Last Lap-Band fill: N/A  Recent physical activity:  Walking 30 min, 7 days/week  Progress Towards Goal(s):  In progress.  Handouts given during visit include:  Phase 3B:High Protein + NS vegetables   Nutritional Diagnosis:  [place]-3.3 Overweight/obesity related to past poor dietary habits and physical inactivity as evidenced by patient w/ recent sleeve gastrectomy surgery following dietary guidelines for continued weight loss.     Intervention:  Nutrition education and counseling. Goals:  Follow Phase 3B: High Protein + Non-Starchy Vegetables  Eat 3-6 small meals/snacks, every 3-5 hrs  Increase lean protein foods to meet 60g goal  Increase fluid intake to 64oz +  Avoid drinking 15 minutes before, during and 30 minutes after eating  Aim for >30 min of physical activity daily  Teaching Method Utilized:  Visual Auditory Hands on  Barriers to learning/adherence to lifestyle change: none  Demonstrated degree of understanding via:  Teach Back   Monitoring/Evaluation:  Dietary intake, exercise, lap band fills, and body weight. Follow up in 3 months for 5 month post-op visit.

## 2017-05-26 NOTE — Patient Instructions (Signed)
Goals:  Follow Phase 3B: High Protein + Non-Starchy Vegetables  Eat 3-6 small meals/snacks, every 3-5 hrs  Increase lean protein foods to meet 60g goal  Increase fluid intake to 64oz +  Avoid drinking 15 minutes before, during and 30 minutes after eating  Aim for >30 min of physical activity daily  

## 2017-07-06 ENCOUNTER — Other Ambulatory Visit: Payer: Self-pay

## 2017-07-06 MED ORDER — HYDROCHLOROTHIAZIDE 12.5 MG PO CAPS: 12.5000 mg | ORAL_CAPSULE | Freq: Every day | ORAL | 0 refills | Status: DC

## 2017-07-09 DIAGNOSIS — J0100 Acute maxillary sinusitis, unspecified: Secondary | ICD-10-CM | POA: Diagnosis not present

## 2017-07-15 DIAGNOSIS — G894 Chronic pain syndrome: Secondary | ICD-10-CM | POA: Diagnosis not present

## 2017-07-15 DIAGNOSIS — M47816 Spondylosis without myelopathy or radiculopathy, lumbar region: Secondary | ICD-10-CM | POA: Diagnosis not present

## 2017-07-15 DIAGNOSIS — K5900 Constipation, unspecified: Secondary | ICD-10-CM | POA: Diagnosis not present

## 2017-07-15 DIAGNOSIS — M62830 Muscle spasm of back: Secondary | ICD-10-CM | POA: Diagnosis not present

## 2017-08-05 DIAGNOSIS — R69 Illness, unspecified: Secondary | ICD-10-CM | POA: Diagnosis not present

## 2017-08-10 DIAGNOSIS — R69 Illness, unspecified: Secondary | ICD-10-CM | POA: Diagnosis not present

## 2017-08-12 DIAGNOSIS — M47816 Spondylosis without myelopathy or radiculopathy, lumbar region: Secondary | ICD-10-CM | POA: Diagnosis not present

## 2017-08-12 DIAGNOSIS — G894 Chronic pain syndrome: Secondary | ICD-10-CM | POA: Diagnosis not present

## 2017-08-12 DIAGNOSIS — K5900 Constipation, unspecified: Secondary | ICD-10-CM | POA: Diagnosis not present

## 2017-08-12 DIAGNOSIS — M62830 Muscle spasm of back: Secondary | ICD-10-CM | POA: Diagnosis not present

## 2017-08-24 ENCOUNTER — Ambulatory Visit: Payer: Self-pay | Admitting: Skilled Nursing Facility1

## 2017-08-25 DIAGNOSIS — R69 Illness, unspecified: Secondary | ICD-10-CM | POA: Diagnosis not present

## 2017-08-26 DIAGNOSIS — R0782 Intercostal pain: Secondary | ICD-10-CM | POA: Diagnosis not present

## 2017-09-09 ENCOUNTER — Ambulatory Visit
Admission: RE | Admit: 2017-09-09 | Discharge: 2017-09-09 | Disposition: A | Discharge status: Home / Self Care | Payer: Self-pay | Source: Ambulatory Visit | Attending: Physical Medicine and Rehabilitation | Admitting: Physical Medicine and Rehabilitation

## 2017-09-09 ENCOUNTER — Other Ambulatory Visit: Payer: Self-pay | Admitting: Physical Medicine and Rehabilitation

## 2017-09-09 DIAGNOSIS — K5900 Constipation, unspecified: Secondary | ICD-10-CM | POA: Diagnosis not present

## 2017-09-09 DIAGNOSIS — M545 Low back pain: Secondary | ICD-10-CM

## 2017-09-09 DIAGNOSIS — M62830 Muscle spasm of back: Secondary | ICD-10-CM | POA: Diagnosis not present

## 2017-09-09 DIAGNOSIS — G894 Chronic pain syndrome: Secondary | ICD-10-CM | POA: Diagnosis not present

## 2017-09-09 DIAGNOSIS — S3992XA Unspecified injury of lower back, initial encounter: Secondary | ICD-10-CM | POA: Diagnosis not present

## 2017-09-09 DIAGNOSIS — Z79891 Long term (current) use of opiate analgesic: Secondary | ICD-10-CM | POA: Diagnosis not present

## 2017-09-09 DIAGNOSIS — M47816 Spondylosis without myelopathy or radiculopathy, lumbar region: Secondary | ICD-10-CM | POA: Diagnosis not present

## 2017-09-14 DIAGNOSIS — R69 Illness, unspecified: Secondary | ICD-10-CM | POA: Diagnosis not present

## 2017-09-16 DIAGNOSIS — R69 Illness, unspecified: Secondary | ICD-10-CM | POA: Diagnosis not present

## 2017-09-17 DIAGNOSIS — Z9884 Bariatric surgery status: Secondary | ICD-10-CM | POA: Diagnosis not present

## 2017-09-17 DIAGNOSIS — B370 Candidal stomatitis: Secondary | ICD-10-CM | POA: Diagnosis not present

## 2017-09-17 DIAGNOSIS — R0781 Pleurodynia: Secondary | ICD-10-CM | POA: Diagnosis not present

## 2017-09-18 ENCOUNTER — Other Ambulatory Visit: Payer: Self-pay | Admitting: General Surgery

## 2017-09-18 ENCOUNTER — Ambulatory Visit
Admission: RE | Admit: 2017-09-18 | Discharge: 2017-09-18 | Disposition: A | Discharge status: Home / Self Care | Payer: Medicare HMO | Source: Ambulatory Visit | Attending: General Surgery | Admitting: General Surgery

## 2017-09-18 DIAGNOSIS — R0781 Pleurodynia: Secondary | ICD-10-CM

## 2017-09-18 DIAGNOSIS — S299XXA Unspecified injury of thorax, initial encounter: Secondary | ICD-10-CM | POA: Diagnosis not present

## 2017-10-01 ENCOUNTER — Other Ambulatory Visit: Payer: Self-pay | Admitting: Family Medicine

## 2017-10-07 DIAGNOSIS — M47816 Spondylosis without myelopathy or radiculopathy, lumbar region: Secondary | ICD-10-CM | POA: Diagnosis not present

## 2017-10-07 DIAGNOSIS — G894 Chronic pain syndrome: Secondary | ICD-10-CM | POA: Diagnosis not present

## 2017-10-07 DIAGNOSIS — K5900 Constipation, unspecified: Secondary | ICD-10-CM | POA: Diagnosis not present

## 2017-10-07 DIAGNOSIS — M62830 Muscle spasm of back: Secondary | ICD-10-CM | POA: Diagnosis not present

## 2017-10-09 DIAGNOSIS — R69 Illness, unspecified: Secondary | ICD-10-CM | POA: Diagnosis not present

## 2017-11-03 DIAGNOSIS — M1711 Unilateral primary osteoarthritis, right knee: Secondary | ICD-10-CM | POA: Diagnosis not present

## 2017-11-03 DIAGNOSIS — G894 Chronic pain syndrome: Secondary | ICD-10-CM | POA: Diagnosis not present

## 2017-11-03 DIAGNOSIS — K5900 Constipation, unspecified: Secondary | ICD-10-CM | POA: Diagnosis not present

## 2017-11-03 DIAGNOSIS — M62830 Muscle spasm of back: Secondary | ICD-10-CM | POA: Diagnosis not present

## 2017-11-03 DIAGNOSIS — M47816 Spondylosis without myelopathy or radiculopathy, lumbar region: Secondary | ICD-10-CM | POA: Diagnosis not present

## 2017-11-10 DIAGNOSIS — M461 Sacroiliitis, not elsewhere classified: Secondary | ICD-10-CM | POA: Diagnosis not present

## 2017-11-25 DIAGNOSIS — R69 Illness, unspecified: Secondary | ICD-10-CM | POA: Diagnosis not present

## 2017-12-07 DIAGNOSIS — M62830 Muscle spasm of back: Secondary | ICD-10-CM | POA: Diagnosis not present

## 2017-12-07 DIAGNOSIS — R69 Illness, unspecified: Secondary | ICD-10-CM | POA: Diagnosis not present

## 2017-12-07 DIAGNOSIS — K5900 Constipation, unspecified: Secondary | ICD-10-CM | POA: Diagnosis not present

## 2017-12-07 DIAGNOSIS — M47816 Spondylosis without myelopathy or radiculopathy, lumbar region: Secondary | ICD-10-CM | POA: Diagnosis not present

## 2017-12-07 DIAGNOSIS — G894 Chronic pain syndrome: Secondary | ICD-10-CM | POA: Diagnosis not present

## 2017-12-21 ENCOUNTER — Encounter: Payer: Self-pay | Admitting: Obstetrics & Gynecology

## 2017-12-21 ENCOUNTER — Ambulatory Visit: Payer: Medicare HMO | Admitting: Obstetrics & Gynecology

## 2017-12-21 VITALS — BP 110/73 | HR 97 | Resp 16 | Ht 67.0 in | Wt 211.0 lb

## 2017-12-21 DIAGNOSIS — L659 Nonscarring hair loss, unspecified: Secondary | ICD-10-CM | POA: Diagnosis not present

## 2017-12-21 DIAGNOSIS — Z0000 Encounter for general adult medical examination without abnormal findings: Secondary | ICD-10-CM

## 2017-12-21 MED ORDER — SCOPOLAMINE 1 MG/3DAYS TD PT72: 1.0000 | MEDICATED_PATCH | TRANSDERMAL | Status: DC

## 2017-12-21 MED ORDER — VALACYCLOVIR HCL 1 G PO TABS: ORAL_TABLET | ORAL | 12 refills | Status: DC

## 2017-12-21 MED ORDER — FLUCONAZOLE 150 MG PO TABS: 150.0000 mg | ORAL_TABLET | Freq: Once | ORAL | 3 refills | Status: AC

## 2017-12-21 MED ORDER — CONJ ESTROG-MEDROXYPROGEST ACE 0.625-2.5 MG PO TABS: 1.0000 | ORAL_TABLET | Freq: Every day | ORAL | 5 refills | Status: DC

## 2017-12-21 MED ORDER — SULFAMETHOXAZOLE-TRIMETHOPRIM 800-160 MG PO TABS: 1.0000 | ORAL_TABLET | Freq: Two times a day (BID) | ORAL | 0 refills | Status: DC

## 2017-12-21 NOTE — Progress Notes (Addendum)
   Subjective:    Patient ID: [name], female    DOB: August 24, 1965, 52 y.o.   MRN: [id number]  HPI 52 yo married P0 here today with no period for a year and HOT FLASHES, night sweats, acne, vaginal dryness, decreased concentration, mood swings and hair loss.    Review of Systems She had a gastric sleeve 9/18 No FH of breast cancer    Objective:   Physical Exam Breathing, conversing, and ambulating normally Well nourished, well hydrated White female, no apparent distress     Assessment & Plan:  Menopausal symptoms- we discussed risks of estrogen as well as the benefits and ACOG recs. Check TSH Prempro .625/2.5 Come back 4 weeks Mammogram

## 2017-12-21 NOTE — Addendum Note (Signed)
Addended by: Emily Filbert on: 12/21/2017 02:56 PM   Modules accepted: Orders

## 2017-12-22 ENCOUNTER — Telehealth: Payer: Self-pay

## 2017-12-22 ENCOUNTER — Ambulatory Visit: Payer: Medicare HMO | Admitting: Family Medicine

## 2017-12-22 ENCOUNTER — Telehealth: Payer: Self-pay | Admitting: *Deleted

## 2017-12-22 ENCOUNTER — Encounter: Payer: Self-pay | Admitting: Family Medicine

## 2017-12-22 VITALS — BP 115/76 | HR 91 | Ht 67.0 in | Wt 213.0 lb

## 2017-12-22 DIAGNOSIS — F3177 Bipolar disorder, in partial remission, most recent episode mixed: Secondary | ICD-10-CM | POA: Diagnosis not present

## 2017-12-22 DIAGNOSIS — Z7189 Other specified counseling: Secondary | ICD-10-CM | POA: Diagnosis not present

## 2017-12-22 DIAGNOSIS — K219 Gastro-esophageal reflux disease without esophagitis: Secondary | ICD-10-CM

## 2017-12-22 DIAGNOSIS — N951 Menopausal and female climacteric states: Secondary | ICD-10-CM | POA: Diagnosis not present

## 2017-12-22 DIAGNOSIS — R69 Illness, unspecified: Secondary | ICD-10-CM | POA: Diagnosis not present

## 2017-12-22 DIAGNOSIS — B351 Tinea unguium: Secondary | ICD-10-CM | POA: Diagnosis not present

## 2017-12-22 DIAGNOSIS — Z6833 Body mass index (BMI) 33.0-33.9, adult: Secondary | ICD-10-CM

## 2017-12-22 DIAGNOSIS — Z7184 Encounter for health counseling related to travel: Secondary | ICD-10-CM

## 2017-12-22 LAB — TSH: TSH: 1.26 m[IU]/L

## 2017-12-22 MED ORDER — SCOPOLAMINE 1 MG/3DAYS TD PT72: 1.0000 | MEDICATED_PATCH | TRANSDERMAL | 3 refills | Status: DC

## 2017-12-22 MED ORDER — TERBINAFINE HCL 250 MG PO TABS: 250.0000 mg | ORAL_TABLET | Freq: Every day | ORAL | 0 refills | Status: AC

## 2017-12-22 MED ORDER — MEDROXYPROGESTERONE ACETATE 2.5 MG PO TABS: 2.5000 mg | ORAL_TABLET | Freq: Every day | ORAL | 3 refills | Status: DC

## 2017-12-22 MED ORDER — PANTOPRAZOLE SODIUM 40 MG PO TBEC: 40.0000 mg | DELAYED_RELEASE_TABLET | Freq: Every day | ORAL | 3 refills | Status: DC

## 2017-12-22 MED ORDER — ESTRADIOL 1 MG PO TABS: 1.0000 mg | ORAL_TABLET | Freq: Every day | ORAL | 3 refills | Status: DC

## 2017-12-22 NOTE — Telephone Encounter (Signed)
Pt called stating that the Prempro was 400.00 and she cannot afford that.  She is requesting something cheaper.  Per Dr Hulan Fray she may have Estadiol 1 mg daily and Provera 2.5 mg daily.  This was sent to her pharmacy and pt is aware of the change.

## 2017-12-22 NOTE — Progress Notes (Signed)
[name] is a 52 y.o. female who presents to [place]: [place] today for follow-up gastric sleeve, discuss mood, discuss left great toenail fungus.  Ivianna had gastric sleeve in September 2018.  She is doing well and has lost weight.  She is eating a relatively high protein calorie restricted diet and feels fine.  She notes that she feels more energetic and is pretty happy with how things are going.  She is not sure if she should have recheck labs in the near future or not.  Additionally when he would like to discuss mood.  She has a history of bipolar disorder with anxiety and depressive features.  She notes increased family stress recently.  [facility] and [facility] care.  She takes medications listed below.  She notes that she is caring for her elderly parents which is a great source of stress.  Additionally when he notes toenail fungus of her left great toenail.  She notes that the mildly irritated and obnoxious.  She is interested in treatment if possible.  Additionally patient notes that she is traveling in the near future and would like a scopolamine patch.  Additionally she was seen by OB/GYN recently and was found to be perimenopausal and was prescribed progesterone and estrogen.  She notes that the combined progesterone estrogen medication is very expensive and she is called the OB/GYN office back to inquire about a cheaper alternative. ROS as above:  Exam:  BP 115/76   Pulse 91   Ht 5\' 7"  (1.702 m)   Wt 213 lb (96.6 kg)   LMP 08/22/2016 (Approximate) Comment: preg test waiver signed  BMI 33.36 kg/m   Wt Readings from Last 10 Encounters:  12/22/17 213 lb (96.6 kg)  12/21/17 211 lb (95.7 kg)  05/26/17 218 lb 12.8 oz (99.2 kg)  04/16/17 226 lb 3.2 oz (102.6 kg)  04/13/17 226 lb 14.4 oz (102.9 kg)  03/30/17 232  lb 2 oz (105.3 kg)  03/26/17 230 lb (104.3 kg)  03/18/17 238 lb 8 oz (108.2 kg)  03/04/17 237 lb (107.5 kg)  02/18/17 239 lb 14.4 oz (108.8 kg)    Gen: Well NAD HEENT: EOMI,  MMM Lungs: Normal work of breathing. CTABL Heart: RRR no MRG Abd: NABS, Soft. Nondistended, Nontender Exts: Brisk capillary refill, warm and well perfused.  Psych: Alert and oriented.  Affect tearful at time.  Normal speech thought process.  No SI or HI expressed. Skin: Left great toenail whitish material at the lateral border consistent with onychomycosis  Depression screen [facility] 2/9 12/22/2017 05/26/2017 02/17/2017  Decreased Interest 2 0 0  Down, Depressed, Hopeless 2 0 0  PHQ - 2 Score 4 0 0  Altered sleeping 3 - -  Tired, decreased energy 3 - -  Change in appetite 2 - -  Feeling bad or failure about yourself  2 - -  Trouble concentrating 3 - -  Moving slowly or fidgety/restless 1 - -  Suicidal thoughts 0 - -  PHQ-9 Score 18 - -  Difficult doing work/chores Very difficult - -   GAD 7 : Generalized Anxiety Score 12/22/2017  Nervous, Anxious, on Edge 2  Control/stop worrying 3  Worry too much - different things 3  Trouble relaxing 3  Restless 0  Easily annoyed or irritable 3  Afraid - awful might happen 3  Total GAD 7 Score 17  Anxiety Difficulty Somewhat difficult  Lab and Radiology Results Results for orders placed or performed in visit on 12/21/17 (from the past 72 hour(s))  TSH     Status: None   Collection Time: 12/21/17  2:52 PM  Result Value Ref Range   TSH 1.26 mIU/L    Comment:           Reference Range .           > or = 20 Years  0.40-4.50 .                Pregnancy Ranges           First trimester    0.26-2.66           Second trimester   0.55-2.73           Third trimester    0.43-2.91      Chemistry      Component Value Date/Time   NA 139 03/31/2017 0514   NA 138 02/26/2016   K 4.0 03/31/2017 0514   CL 101 03/31/2017 0514   CO2 29 03/31/2017 0514   BUN 7  03/31/2017 0514   CREATININE 0.82 03/31/2017 0514   GLU 91 02/26/2016      Component Value Date/Time   CALCIUM 9.3 03/31/2017 0514   ALKPHOS 72 03/31/2017 0514   AST 26 03/31/2017 0514   ALT 23 03/31/2017 0514   BILITOT 0.5 03/31/2017 0514        Assessment and Plan: 52 y.o. female with  Status post gastric sleeve.  Doing very well maintaining weight loss.  Letter sent to general surgery.  I think it be reasonable to check vitamin stores and anemia iron stores in a few months.  I am happy to do this.  Protonix refilled.  Mood: Slightly worse due to increased life stress.  Patient has a good support network is receiving excellent psychology and psychiatry care.  [facility] and I will send a copy of this note as well.  Onychomycosis: Start terbinafine today.  Liver enzymes normal in September.  We will recheck metabolic panel in about 3 months on recheck.  Perimenopausal symptoms: Hormone replacement therapy reasonable.  Proceed via OB/GYN care.  Scopolamine patch prescribed today.   No orders of the defined types were placed in this encounter.  Meds ordered this encounter  Medications  . scopolamine (TRANSDERM-SCOP, 1.5 MG,) 1 MG/3DAYS    Sig: Place 1 patch (1.5 mg total) onto the skin every 3 (three) days.    Dispense:  4 patch    Refill:  3  . pantoprazole (PROTONIX) 40 MG tablet    Sig: Take 1 tablet (40 mg total) by mouth daily.    Dispense:  90 tablet    Refill:  3  . terbinafine (LAMISIL) 250 MG tablet    Sig: Take 1 tablet (250 mg total) by mouth daily.    Dispense:  90 tablet    Refill:  0     Historical information moved to improve visibility of documentation.  Past Medical History:  Diagnosis Date  . Allergy   . Anxiety   . Arthritis   . Asthma    History of Asthma  . Bipolar disorder ([place])   . Chest pain, atypical 11/30/2012  . Depression   . Diabetes mellitus without complication ([place])    type 2  . GERD  (gastroesophageal reflux disease)   . H/O degenerative disc disease    L4-L5, L5-S1  .  Hyperglycemia    Postoperative hyperglycemia  . Hypertension   . 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 Obesity 07/14/2012  . OSA (obstructive sleep apnea)    mild  . Pneumonia   . Polycystic disease, ovaries   . Postoperative anemia    2018 - while dieting  hx of  . Reflux   . Sinus tachycardia 07/14/2012   Pt says HR > 100 is consistent   Past Surgical History:  Procedure Laterality Date  . FOOT SURGERY     Right plantar facsiitis scrape  . KNEE SURGERY Bilateral 2007  . LAPAROSCOPIC GASTRIC SLEEVE RESECTION N/A 03/30/2017   Procedure: LAPAROSCOPIC GASTRIC SLEEVE RESECTION, UPPER ENDO;  Surgeon: Greer Pickerel, MD;  Location: [facility];  Service: General;  Laterality: N/A;  . NASAL SINUS SURGERY  2004  . OVARIAN CYST REMOVAL  1998   A cyst on the fallopian tube removed  . SPINAL FUSION    . TONSILLECTOMY  1996   Social History   Tobacco Use  . Smoking status: Never Smoker  . Smokeless tobacco: Never Used  Substance Use Topics  . Alcohol use: No   family history includes Cancer in her father, maternal grandmother, paternal aunt, paternal aunt, paternal grandmother, and paternal uncle; Diabetes in her father, mother, and paternal aunt.  Medications: Current Outpatient Medications  Medication Sig Dispense Refill  . albuterol (PROAIR HFA) 108 (90 BASE) MCG/ACT inhaler Inhale 2 puffs into the lungs every 4 (four) hours as needed for wheezing or shortness of breath.    . ALPRAZolam (XANAX) 1 MG tablet Take 1 mg by mouth daily as needed (for anxiety).     . budesonide-formoterol (SYMBICORT) 160-4.5 MCG/ACT inhaler Inhale 2 puffs into the lungs 2 (two) times daily. 3 Inhaler 4  . buPROPion (WELLBUTRIN XL) 150 MG 24 hr tablet Take 150 mg by mouth daily with breakfast. Total daily dose=450mg     . buPROPion (WELLBUTRIN XL) 300 MG 24 hr tablet Take 300 mg  by mouth daily with breakfast. Total daily dose=450mg     . Calcium Citrate-Vitamin D (CELEBRATE CALCIUM PLUS 500) 500-333 MG-UNIT CHEW Chew 1 tablet by mouth 3 (three) times daily.    . cyclobenzaprine (FLEXERIL) 10 MG tablet Take 10 mg by mouth 3 (three) times daily as needed for muscle spasms.     . diclofenac sodium (VOLTAREN) 1 % GEL Apply 4 g topically 4 (four) times daily. To affected joint. (Patient taking differently: Apply 4 g topically 4 (four) times daily as needed (for pain. (DO NOT USE IN COMBINATION WITH MELOXICAM)). To affected joint.) 100 g 11  . escitalopram (LEXAPRO) 10 MG tablet Take 10 mg by mouth daily with breakfast.     . estrogen, conjugated,-medroxyprogesterone (PREMPRO) 0.625-2.5 MG tablet Take 1 tablet by mouth daily. 90 tablet 5  . fentaNYL (DURAGESIC - DOSED MCG/HR) 50 MCG/HR Place 50 mcg onto the skin every 3 (three) days.     . fluticasone (FLONASE) 50 MCG/ACT nasal spray Place 1 spray into the nose daily as needed (for allergies.).     Marland Kitchen HYDROmorphone (DILAUDID) 2 MG tablet Take 2 mg by mouth 4 (four) times daily.     Marland Kitchen MOVANTIK 25 MG TABS tablet Take 25 mg by mouth at bedtime.     . Multiple Vitamins-Minerals (CELEBRATE MULTI-COMPLETE 36) CHEW Chew 1 tablet by mouth 2 (two) times daily.    . nortriptyline (PAMELOR) 75 MG capsule Take 150 mg by mouth  at bedtime.     . pantoprazole (PROTONIX) 40 MG tablet Take 1 tablet (40 mg total) by mouth daily. 90 tablet 3  . sulfamethoxazole-trimethoprim (BACTRIM DS,SEPTRA DS) 800-160 MG tablet Take 1 tablet by mouth 2 (two) times daily. 14 tablet 0  . traZODone (DESYREL) 100 MG tablet Take 300 mg by mouth at bedtime.     . valACYclovir (VALTREX) 1000 MG tablet 1 tablet daily 31 tablet 12  . scopolamine (TRANSDERM-SCOP, 1.5 MG,) 1 MG/3DAYS Place 1 patch (1.5 mg total) onto the skin every 3 (three) days. 4 patch 3  . terbinafine (LAMISIL) 250 MG tablet Take 1 tablet (250 mg total) by mouth daily. 90 tablet 0   No current  facility-administered medications for this visit.    Allergies  Allergen Reactions  . Gabapentin Nausea And Vomiting and Anaphylaxis  . Pineapple Shortness Of Breath and Swelling    Throat swells  . Shellfish-Derived Products Anaphylaxis  . Erythromycin Swelling  . Latex Rash    Hives  . Pregabalin Swelling  . Duloxetine Other (See Comments)    Confusion/dizziness  . Pollen Extract Other (See Comments)    Stuffy  Nose    Health Maintenance Health Maintenance  Topic Date Due  . HIV Screening  03/21/1982  . COLONOSCOPY  03/21/2017  . INFLUENZA VACCINE  02/04/2018  . PAP SMEAR  03/27/2019  . TETANUS/TDAP  06/19/2019  . MAMMOGRAM  12/22/2019    CC: Wells Guiles [phone] Kandra Nicolas, LPC, LCAS  Discussed warning signs or symptoms. Please see discharge instructions. Patient expresses understanding.

## 2017-12-22 NOTE — Telephone Encounter (Signed)
Pt called and stated that the medication that was prescribed is too expensive.  Can something else be prescribed?

## 2017-12-22 NOTE — Patient Instructions (Addendum)
Thank you for coming in today. Continue current treatment.  I anticipate getting labs later this year.  Keep me updated.  Recheck as needed.   Recheck in 3 months for toenail fungus   Terbinafine tablets What is this medicine? TERBINAFINE (TER bin a feen) is an antifungal medicine. It is used to treat certain kinds of fungal or yeast infections. This medicine may be used for other purposes; ask your health care provider or pharmacist if you have questions. COMMON BRAND NAME(S): Lamisil, Terbinex What should I tell my health care provider before I take this medicine? They need to know if you have any of these conditions: -drink alcoholic beverages -kidney disease -liver disease -an unusual or allergic reaction to terbinafine, other medicines, foods, dyes, or preservatives -pregnant or trying to get pregnant -breast-feeding How should I use this medicine? Take this medicine by mouth with a full glass of water. Follow the directions on the prescription label. You can take this medicine with food or on an empty stomach. Take your medicine at regular intervals. Do not take your medicine more often than directed. Do not skip doses or stop your medicine early even if you feel better. Do not stop taking except on your doctor's advice. Talk to your pediatrician regarding the use of this medicine in children. Special care may be needed. Overdosage: If you think you have taken too much of this medicine contact a poison control center or emergency room at once. NOTE: This medicine is only for you. Do not share this medicine with others. What if I miss a dose? If you miss a dose, take it as soon as you can. If it is almost time for your next dose, take only that dose. Do not take double or extra doses. What may interact with this medicine? Do not take this medicine with any of the following medications: -thioridazine This medicine may also interact with the following  medications: -beta-blockers -caffeine -cimetidine -cyclosporine -medicines for depression, anxiety, or psychotic disturbances -medicines for fungal infections like fluconazole and ketoconazole -medicines for irregular heartbeat like amiodarone, flecainide and propafenone -rifampin -warfarin This list may not describe all possible interactions. Give your health care provider a list of all the medicines, herbs, non-prescription drugs, or dietary supplements you use. Also tell them if you smoke, drink alcohol, or use illegal drugs. Some items may interact with your medicine. What should I watch for while using this medicine? Visit your doctor or health care provider regularly. Tell your doctor right away if you have nausea or vomiting, loss of appetite, stomach pain on your right upper side, yellow skin, dark urine, light stools, or are over tired. Some fungal infections need many weeks or months of treatment to cure. If you are taking this medicine for a long time, you will need to have important blood work done. What side effects may I notice from receiving this medicine? Side effects that you should report to your doctor or health care professional as soon as possible: -allergic reactions like skin rash or hives, swelling of the face, lips, or tongue -changes in vision -dark urine -fever or infection -general ill feeling or flu-like symptoms -light-colored stools -loss of appetite, nausea -redness, blistering, peeling or loosening of the skin, including inside the mouth -right upper belly pain -unusually weak or tired -yellowing of the eyes or skin Side effects that usually do not require medical attention (report to your doctor or health care professional if they continue or are bothersome): -changes in taste -diarrhea -  hair loss -muscle or joint pain -stomach gas -stomach upset This list may not describe all possible side effects. Call your doctor for medical advice about side  effects. You may report side effects to FDA at 1-800-FDA-1088. Where should I keep my medicine? Keep out of the reach of children. Store at room temperature below 25 degrees C (77 degrees F). Protect from light. Throw away any unused medicine after the expiration date. NOTE: This sheet is a summary. It may not cover all possible information. If you have questions about this medicine, talk to your doctor, pharmacist, or health care provider.  2018 Elsevier/Gold Standard (2007-09-03 16:28:07)

## 2017-12-30 ENCOUNTER — Telehealth: Payer: Self-pay | Admitting: Family Medicine

## 2017-12-30 ENCOUNTER — Encounter: Payer: Self-pay | Admitting: Family Medicine

## 2017-12-30 NOTE — Telephone Encounter (Signed)
[facility]. Medications updated.  Will send copies of notes to scan. We will continue to send office notes to [facility]

## 2018-01-06 ENCOUNTER — Ambulatory Visit (INDEPENDENT_AMBULATORY_CARE_PROVIDER_SITE_OTHER): Payer: Medicare HMO

## 2018-01-06 DIAGNOSIS — Z1231 Encounter for screening mammogram for malignant neoplasm of breast: Secondary | ICD-10-CM | POA: Diagnosis not present

## 2018-01-06 DIAGNOSIS — Z0000 Encounter for general adult medical examination without abnormal findings: Secondary | ICD-10-CM

## 2018-01-12 DIAGNOSIS — K5900 Constipation, unspecified: Secondary | ICD-10-CM | POA: Diagnosis not present

## 2018-01-12 DIAGNOSIS — M47816 Spondylosis without myelopathy or radiculopathy, lumbar region: Secondary | ICD-10-CM | POA: Diagnosis not present

## 2018-01-12 DIAGNOSIS — G894 Chronic pain syndrome: Secondary | ICD-10-CM | POA: Diagnosis not present

## 2018-01-12 DIAGNOSIS — M62830 Muscle spasm of back: Secondary | ICD-10-CM | POA: Diagnosis not present

## 2018-01-18 ENCOUNTER — Ambulatory Visit: Payer: Medicare HMO | Admitting: Obstetrics & Gynecology

## 2018-01-18 ENCOUNTER — Encounter: Payer: Self-pay | Admitting: Obstetrics & Gynecology

## 2018-01-18 VITALS — BP 121/85 | HR 107 | Resp 16 | Ht 67.0 in | Wt 204.0 lb

## 2018-01-18 DIAGNOSIS — N950 Postmenopausal bleeding: Secondary | ICD-10-CM | POA: Diagnosis not present

## 2018-01-18 NOTE — Progress Notes (Signed)
   Subjective:    Patient ID: [name], female    DOB: 23-Jun-1966, 52 y.o.   MRN: [id number]  HPI 52 yo married P0 here for follow up after starting daily continuous HRT for menopausal symptoms. She has not had a period for 2 years. Her [place] level in 2017 was 2.9.  After starting the HRT, her symptoms have resolved, she has lost 10 pounds, and feels "like a new woman". She did have some spotting.   Review of Systems Her father is in the process of dying.    Objective:   Physical Exam  Breathing, conversing, and ambulating normally Well nourished, well hydrated White female, no apparent distress Abd- benign     Assessment & Plan:  Spotting with HRT- check Gyn u/s I will send her results to her via MyChart

## 2018-01-19 DIAGNOSIS — R69 Illness, unspecified: Secondary | ICD-10-CM | POA: Diagnosis not present

## 2018-01-25 DIAGNOSIS — R69 Illness, unspecified: Secondary | ICD-10-CM | POA: Diagnosis not present

## 2018-01-26 ENCOUNTER — Encounter: Payer: Self-pay | Admitting: Family Medicine

## 2018-01-26 ENCOUNTER — Ambulatory Visit (INDEPENDENT_AMBULATORY_CARE_PROVIDER_SITE_OTHER): Payer: Medicare HMO | Admitting: Family Medicine

## 2018-01-26 ENCOUNTER — Ambulatory Visit (INDEPENDENT_AMBULATORY_CARE_PROVIDER_SITE_OTHER): Payer: Medicare HMO

## 2018-01-26 VITALS — BP 136/83 | HR 91 | Wt 219.0 lb

## 2018-01-26 DIAGNOSIS — B351 Tinea unguium: Secondary | ICD-10-CM | POA: Diagnosis not present

## 2018-01-26 DIAGNOSIS — Z1211 Encounter for screening for malignant neoplasm of colon: Secondary | ICD-10-CM

## 2018-01-26 DIAGNOSIS — M26622 Arthralgia of left temporomandibular joint: Secondary | ICD-10-CM | POA: Diagnosis not present

## 2018-01-26 DIAGNOSIS — D72829 Elevated white blood cell count, unspecified: Secondary | ICD-10-CM

## 2018-01-26 DIAGNOSIS — N950 Postmenopausal bleeding: Secondary | ICD-10-CM | POA: Diagnosis not present

## 2018-01-26 MED ORDER — BUDESONIDE-FORMOTEROL FUMARATE 160-4.5 MCG/ACT IN AERO
2.0000 | INHALATION_SPRAY | Freq: Two times a day (BID) | RESPIRATORY_TRACT | 4 refills | Status: DC
Start: 2018-01-26 — End: 2021-04-04

## 2018-01-26 MED ORDER — ZOSTER VAC RECOMB ADJUVANTED 50 MCG/0.5ML IM SUSR: 0.5000 mL | Freq: Once | INTRAMUSCULAR | 1 refills | Status: AC

## 2018-01-26 NOTE — Patient Instructions (Addendum)
Thank you for coming in today.  In 1-2 months get labs.   For jaw pain consider trying at home soft bite guard kit.  Try diclofenac gel as needed.  Recheck as needed  Get cologuard.    Temporomandibular Joint Syndrome Temporomandibular joint (TMJ) syndrome is a condition that affects the joints between your jaw and your skull. The TMJs are located near your ears and allow your jaw to open and close. These joints and the nearby muscles are involved in all movements of the jaw. People with TMJ syndrome have pain in the area of these joints and muscles. Chewing, biting, or other movements of the jaw can be difficult or painful. TMJ syndrome can be caused by various things. In many cases, the condition is mild and goes away within a few weeks. For some people, the condition can become a long-term problem. What are the causes? Possible causes of TMJ syndrome include:  Grinding your teeth or clenching your jaw. Some people do this when they are under stress.  Arthritis.  Injury to the jaw.  Head or neck injury.  Teeth or dentures that are not aligned well.  In some cases, the cause of TMJ syndrome may not be known. What are the signs or symptoms? The most common symptom is an aching pain on the side of the head in the area of the TMJ. Other symptoms may include:  Pain when moving your jaw, such as when chewing or biting.  Being unable to open your jaw all the way.  Making a clicking sound when you open your mouth.  Headache.  Earache.  Neck or shoulder pain.  How is this diagnosed? Diagnosis can usually be made based on your symptoms, your medical history, and a physical exam. Your health care provider may check the range of motion of your jaw. Imaging tests, such as X-rays or an MRI, are sometimes done. You may need to see your dentist to determine if your teeth and jaw are lined up correctly. How is this treated? TMJ syndrome often goes away on its own. If treatment is  needed, the options may include:  Eating soft foods and applying ice or heat.  Medicines to relieve pain or inflammation.  Medicines to relax the muscles.  A splint, bite plate, or mouthpiece to prevent teeth grinding or jaw clenching.  Relaxation techniques or counseling to help reduce stress.  Transcutaneous electrical nerve stimulation (TENS). This helps to relieve pain by applying an electrical current through the skin.  Acupuncture. This is sometimes helpful to relieve pain.  Jaw surgery. This is rarely needed.  Follow these instructions at home:  Take medicines only as directed by your health care provider.  Eat a soft diet if you are having trouble chewing.  Apply ice to the painful area. ? Put ice in a plastic bag. ? Place a towel between your skin and the bag. ? Leave the ice on for 20 minutes, 2-3 times a day.  Apply a warm compress to the painful area as directed.  Massage your jaw area and perform any jaw stretching exercises as recommended by your health care provider.  If you were given a mouthpiece or bite plate, wear it as directed.  Avoid foods that require a lot of chewing. Do not chew gum.  Keep all follow-up visits as directed by your health care provider. This is important. Contact a health care provider if:  You are having trouble eating.  You have new or worsening symptoms. Get  help right away if:  Your jaw locks open or closed. This information is not intended to replace advice given to you by your health care provider. Make sure you discuss any questions you have with your health care provider. Document Released: 03/18/2001 Document Revised: 02/21/2016 Document Reviewed: 01/26/2014 Elsevier Interactive Patient Education  [employer].

## 2018-01-26 NOTE — Progress Notes (Signed)
[name] is a 52 y.o. female who presents to [facility]: [facility] today for cologuard questions.   Colon Cancer Screening: She comes in today by the recommendation of her OBGYN to discuss colon cancer screening. She says she had a colonoscopy 8 years ago which did not show any abnormalities. She denies blood in her stool or any other GI problems. She says she is interested in cologuard rather than colonoscopy.   Shingrix: She is also asking for the shingrix vaccine saying that her OBGYN also recommended she get it if she can afford it.   CLL concern: She has multiple family member that have had CLL, including her father who currently has the disease. She is asking about possible genetic testing for CLL that she could have. This is a major concern for her saying she has had all of the medical problems that her father has had and is concerned.    Ear pain: She complains of left ear pain that radiates to her jaw.  She says it hurts her when she chews. Denies blood or discharge from the ear. Denies hearing changes.  Denies excessive chewing or gum chewing.   ROS as above:  Exam:  BP 136/83   Pulse 91   Wt 219 lb (99.3 kg)   LMP 08/22/2016 (Approximate) Comment: preg test waiver signed  BMI 34.30 kg/m  Gen: Well NAD HEENT: EOMI,  MMM. Left ear TM non erythematous non bulging. Tender to palpation over left TMJ.  No palpable click present. Lungs: Normal work of breathing. CTABL Heart: RRR no MRG Abd: NABS, Soft. Nondistended, Nontender Exts: Brisk capillary refill, warm and well perfused.   Lab and Radiology Results CBC reviewed from 03/31/2017 Lab Results  Component Value Date   WBC 12.0 (H) 03/31/2017   HGB 12.6 03/31/2017   HCT 39.7 03/31/2017   MCV 82.4 03/31/2017   PLT 309 03/31/2017      Assessment and Plan: 52 y.o. female with colon cancer screening  questions, ear pain.  She does not report any blood in her stool and has no other indication for a colonoscopy. She is a great candidate for Cologuard and that will be the plan for her. The Shingrix vaccine was ordered as well. She was instructed to check her insurance coverage and purchase it if she can afford it.   CLL concerns: she is concerned because multiple family members have had CLL. She was informed that there are not great genetic tests for CLL. We agreed to follow her white count closely and a CBC and CMP were ordered today.  Ear pain: she has pain in her left ear especially when she chews.  Her TM is non erythematous and non bulging making ear injection highly unlikely.  This is likely due to TMJ given normal ear exam and point tenderness to the TMJ. We discussed trying a home bite guard while she sleeps. If this does not work we discussed talking with her dentist about other nighttime bite guard options.   Shingrix prescription sent to pharmacy.  Orders Placed This Encounter  Procedures  . Cologuard  . CBC with Differential/Platelet  . COMPLETE METABOLIC PANEL WITH GFR   Meds ordered this encounter  Medications  . Zoster Vaccine Adjuvanted [facility]) injection    Sig: Inject 0.5 mLs into the muscle once for 1 dose.    Dispense:  0.5 mL    Refill:  1    Send fax  to Dr Georgina Snell when done [phone]  . budesonide-formoterol (SYMBICORT) 160-4.5 MCG/ACT inhaler    Sig: Inhale 2 puffs into the lungs 2 (two) times daily.    Dispense:  3 Inhaler    Refill:  4     Historical information moved to improve visibility of documentation.  Past Medical History:  Diagnosis Date  . Allergy   . Anxiety   . Arthritis   . Asthma    History of Asthma  . Bipolar disorder ([place])   . Chest pain, atypical 11/30/2012  . Depression   . Diabetes mellitus without complication ([place])    type 2  . GERD (gastroesophageal reflux disease)   . H/O degenerative disc disease    L4-L5, L5-S1  .  Hyperglycemia    Postoperative hyperglycemia  . Hypertension   . 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 Obesity 07/14/2012  . OSA (obstructive sleep apnea)    mild  . Pneumonia   . Polycystic disease, ovaries   . Postoperative anemia    2018 - while dieting  hx of  . Reflux   . Sinus tachycardia 07/14/2012   Pt says HR > 100 is consistent   Past Surgical History:  Procedure Laterality Date  . FOOT SURGERY     Right plantar facsiitis scrape  . KNEE SURGERY Bilateral 2007  . LAPAROSCOPIC GASTRIC SLEEVE RESECTION N/A 03/30/2017   Procedure: LAPAROSCOPIC GASTRIC SLEEVE RESECTION, UPPER ENDO;  Surgeon: Greer Pickerel, MD;  Location: [facility];  Service: General;  Laterality: N/A;  . NASAL SINUS SURGERY  2004  . OVARIAN CYST REMOVAL  1998   A cyst on the fallopian tube removed  . SPINAL FUSION    . TONSILLECTOMY  1996   Social History   Tobacco Use  . Smoking status: Never Smoker  . Smokeless tobacco: Never Used  Substance Use Topics  . Alcohol use: No   family history includes Cancer in her father, maternal grandmother, paternal aunt, paternal aunt, paternal grandmother, and paternal uncle; Diabetes in her father, mother, and paternal aunt.  Medications: Current Outpatient Medications  Medication Sig Dispense Refill  . albuterol (PROAIR HFA) 108 (90 BASE) MCG/ACT inhaler Inhale 2 puffs into the lungs every 4 (four) hours as needed for wheezing or shortness of breath.    . ALPRAZolam (XANAX) 1 MG tablet Take 1 mg by mouth 3 (three) times daily as needed (for anxiety).    . budesonide-formoterol (SYMBICORT) 160-4.5 MCG/ACT inhaler Inhale 2 puffs into the lungs 2 (two) times daily. 3 Inhaler 4  . buPROPion (WELLBUTRIN XL) 150 MG 24 hr tablet Take 150 mg by mouth daily with breakfast. Total daily dose=450mg     . buPROPion (WELLBUTRIN XL) 300 MG 24 hr tablet Take 300 mg by mouth daily with breakfast. Total daily dose=450mg     . Calcium  Citrate-Vitamin D (CELEBRATE CALCIUM PLUS 500) 500-333 MG-UNIT CHEW Chew 1 tablet by mouth 3 (three) times daily.    . cyclobenzaprine (FLEXERIL) 10 MG tablet Take 10 mg by mouth 3 (three) times daily as needed for muscle spasms.     Marland Kitchen escitalopram (LEXAPRO) 10 MG tablet Take 10 mg by mouth daily with breakfast.     . estradiol (ESTRACE) 1 MG tablet Take 1 tablet (1 mg total) by mouth daily. 90 tablet 3  . fentaNYL (DURAGESIC - DOSED MCG/HR) 50 MCG/HR Place 50 mcg onto the skin every 3 (three) days.     . fluticasone (  FLONASE) 50 MCG/ACT nasal spray Place 1 spray into the nose daily as needed (for allergies.).     Marland Kitchen HYDROmorphone (DILAUDID) 2 MG tablet Take 2 mg by mouth 4 (four) times daily.     . medroxyPROGESTERone (PROVERA) 2.5 MG tablet Take 1 tablet (2.5 mg total) by mouth daily. 90 tablet 3  . MOVANTIK 25 MG TABS tablet Take 25 mg by mouth at bedtime.     . Multiple Vitamins-Minerals (CELEBRATE MULTI-COMPLETE 36) CHEW Chew 1 tablet by mouth 2 (two) times daily.    . nortriptyline (PAMELOR) 25 MG capsule Take 50 mg by mouth at bedtime.    . pantoprazole (PROTONIX) 40 MG tablet Take 1 tablet (40 mg total) by mouth daily. 90 tablet 3  . PREMPRO 0.625-2.5 MG tablet     . scopolamine (TRANSDERM-SCOP, 1.5 MG,) 1 MG/3DAYS Place 1 patch (1.5 mg total) onto the skin every 3 (three) days. 4 patch 3  . terbinafine (LAMISIL) 250 MG tablet Take 1 tablet (250 mg total) by mouth daily. 90 tablet 0  . traZODone (DESYREL) 100 MG tablet Take 300 mg by mouth at bedtime.     . valACYclovir (VALTREX) 1000 MG tablet 1 tablet daily 31 tablet 12  . Zoster Vaccine Adjuvanted [facility]) injection Inject 0.5 mLs into the muscle once for 1 dose. 0.5 mL 1   No current facility-administered medications for this visit.    Allergies  Allergen Reactions  . Gabapentin Nausea And Vomiting and Anaphylaxis  . Pineapple Shortness Of Breath and Swelling    Throat swells  . Shellfish-Derived Products Anaphylaxis  .  Erythromycin Swelling  . Latex Rash    Hives  . Pregabalin Swelling  . Duloxetine Other (See Comments)    Confusion/dizziness  . Pollen Extract Other (See Comments)    Stuffy  Nose     Discussed warning signs or symptoms. Please see discharge instructions. Patient expresses understanding.   I personally was present and performed or re-performed the history, physical exam and medical decision-making activities of this service and have verified that the service and findings are accurately documented in the student's note. ___________________________________________ [name] M.D., ABFM., CAQSM. Primary Care and Sports Medicine Adjunct Instructor of [place] [employer] [facility] [employer]

## 2018-02-09 DIAGNOSIS — K5900 Constipation, unspecified: Secondary | ICD-10-CM | POA: Diagnosis not present

## 2018-02-09 DIAGNOSIS — M47816 Spondylosis without myelopathy or radiculopathy, lumbar region: Secondary | ICD-10-CM | POA: Diagnosis not present

## 2018-02-09 DIAGNOSIS — M62830 Muscle spasm of back: Secondary | ICD-10-CM | POA: Diagnosis not present

## 2018-02-09 DIAGNOSIS — G894 Chronic pain syndrome: Secondary | ICD-10-CM | POA: Diagnosis not present

## 2018-02-10 DIAGNOSIS — R69 Illness, unspecified: Secondary | ICD-10-CM | POA: Diagnosis not present

## 2018-02-22 DIAGNOSIS — Z1212 Encounter for screening for malignant neoplasm of rectum: Secondary | ICD-10-CM | POA: Diagnosis not present

## 2018-02-22 DIAGNOSIS — Z1211 Encounter for screening for malignant neoplasm of colon: Secondary | ICD-10-CM | POA: Diagnosis not present

## 2018-02-23 DIAGNOSIS — R69 Illness, unspecified: Secondary | ICD-10-CM | POA: Diagnosis not present

## 2018-02-24 DIAGNOSIS — R69 Illness, unspecified: Secondary | ICD-10-CM | POA: Diagnosis not present

## 2018-02-26 LAB — COLOGUARD: Cologuard: POSITIVE

## 2018-03-02 ENCOUNTER — Telehealth: Payer: Self-pay | Admitting: Family Medicine

## 2018-03-02 DIAGNOSIS — R195 Other fecal abnormalities: Secondary | ICD-10-CM

## 2018-03-02 NOTE — Telephone Encounter (Signed)
Cologuard test was positive.  Refer to gastroenterology.

## 2018-03-05 DIAGNOSIS — D72829 Elevated white blood cell count, unspecified: Secondary | ICD-10-CM | POA: Diagnosis not present

## 2018-03-05 DIAGNOSIS — B351 Tinea unguium: Secondary | ICD-10-CM | POA: Diagnosis not present

## 2018-03-06 LAB — CBC WITH DIFFERENTIAL/PLATELET
BASOS ABS: 57 {cells}/uL (ref 0–200)
Basophils Relative: 0.7 %
EOS ABS: 656 {cells}/uL — AB (ref 15–500)
Eosinophils Relative: 8 %
HEMATOCRIT: 40.5 % (ref 35.0–45.0)
Hemoglobin: 13.5 g/dL (ref 11.7–15.5)
LYMPHS ABS: 1837 {cells}/uL (ref 850–3900)
MCH: 28.5 pg (ref 27.0–33.0)
MCHC: 33.3 g/dL (ref 32.0–36.0)
MCV: 85.6 fL (ref 80.0–100.0)
MPV: 10.7 fL (ref 7.5–12.5)
Monocytes Relative: 9.8 %
NEUTROS PCT: 59.1 %
Neutro Abs: 4846 cells/uL (ref 1500–7800)
Platelets: 259 10*3/uL (ref 140–400)
RBC: 4.73 10*6/uL (ref 3.80–5.10)
RDW: 13.6 % (ref 11.0–15.0)
TOTAL LYMPHOCYTE: 22.4 %
WBC: 8.2 10*3/uL (ref 3.8–10.8)
WBCMIX: 804 {cells}/uL (ref 200–950)

## 2018-03-06 LAB — COMPLETE METABOLIC PANEL WITH GFR
AG Ratio: 1.7 (calc) (ref 1.0–2.5)
ALKALINE PHOSPHATASE (APISO): 88 U/L (ref 33–130)
ALT: 10 U/L (ref 6–29)
AST: 16 U/L (ref 10–35)
Albumin: 4.3 g/dL (ref 3.6–5.1)
BILIRUBIN TOTAL: 0.4 mg/dL (ref 0.2–1.2)
BUN: 14 mg/dL (ref 7–25)
CHLORIDE: 103 mmol/L (ref 98–110)
CO2: 30 mmol/L (ref 20–32)
CREATININE: 0.89 mg/dL (ref 0.50–1.05)
Calcium: 9.6 mg/dL (ref 8.6–10.4)
GFR, Est African American: 88 mL/min/{1.73_m2} (ref >=60)
GFR, Est Non African American: 76 mL/min/{1.73_m2} (ref >=60)
GLOBULIN: 2.6 g/dL (ref 1.9–3.7)
GLUCOSE: 84 mg/dL (ref 65–99)
Potassium: 4.5 mmol/L (ref 3.5–5.3)
SODIUM: 139 mmol/L (ref 135–146)
Total Protein: 6.9 g/dL (ref 6.1–8.1)

## 2018-03-10 ENCOUNTER — Telehealth: Payer: Self-pay

## 2018-03-10 NOTE — Telephone Encounter (Signed)
Faxed request to [facility] [phone] for [facility] records 03/10/18  LM

## 2018-03-11 ENCOUNTER — Telehealth: Payer: Self-pay | Admitting: Internal Medicine

## 2018-03-11 ENCOUNTER — Encounter: Payer: Self-pay | Admitting: Family Medicine

## 2018-03-16 DIAGNOSIS — M62830 Muscle spasm of back: Secondary | ICD-10-CM | POA: Diagnosis not present

## 2018-03-16 DIAGNOSIS — G894 Chronic pain syndrome: Secondary | ICD-10-CM | POA: Diagnosis not present

## 2018-03-16 DIAGNOSIS — M47816 Spondylosis without myelopathy or radiculopathy, lumbar region: Secondary | ICD-10-CM | POA: Diagnosis not present

## 2018-03-16 DIAGNOSIS — Z79891 Long term (current) use of opiate analgesic: Secondary | ICD-10-CM | POA: Diagnosis not present

## 2018-03-16 DIAGNOSIS — K5900 Constipation, unspecified: Secondary | ICD-10-CM | POA: Diagnosis not present

## 2018-03-17 DIAGNOSIS — M47816 Spondylosis without myelopathy or radiculopathy, lumbar region: Secondary | ICD-10-CM | POA: Diagnosis not present

## 2018-03-17 NOTE — Telephone Encounter (Signed)
Outside records reviewed. Colonoscopy with Dr. Newman Pies at [facility] 05/27/2013. Normal colon and normal terminal ileum.  Patient reports positive Cologuard. Thus colonoscopy would be appropriate.  Okay to arrange direct colonoscopy in [place] with me. Thank you

## 2018-03-19 ENCOUNTER — Ambulatory Visit (AMBULATORY_SURGERY_CENTER): Payer: Self-pay | Admitting: *Deleted

## 2018-03-19 ENCOUNTER — Telehealth: Payer: Self-pay | Admitting: *Deleted

## 2018-03-19 VITALS — Ht 67.0 in | Wt 211.0 lb

## 2018-03-19 DIAGNOSIS — R195 Other fecal abnormalities: Secondary | ICD-10-CM

## 2018-03-19 NOTE — Telephone Encounter (Signed)
Dr. Henrene Pastor, Ms. [name] was seen in [place] today.  She is going to have a colonoscopy for a positive cologuard 04-05-18.  She states she has severe reflux, so bad that it can wake her up at night.  She is currently on Pantoprazole but is asking she is needing an EGD to evaluate her reflux.   Please advise, [name]

## 2018-03-19 NOTE — Telephone Encounter (Signed)
Spoke with pt and ECL scheduled for 04-21-18 at 3:30 p.m.  She is also put on a cancellation list.  New instructions mailed to patient.

## 2018-03-19 NOTE — Telephone Encounter (Signed)
Ok to add EGD. Thanks 

## 2018-03-19 NOTE — Progress Notes (Signed)
No egg or soy allergy  No diet medications taken  No home oxygen used  No anesthesia or intubation problems per pt  Registered in [employer]  Plenvu sample given  Pt takes two different stool softeners and states she still sometimes has trouble moving bowels.  She is given a two day Plenvu/Miralax prep.    She states she has severe reflux, so bad that it can wake her up at night.  She is currently on Pantoprazole but is asking she is needing an EGD to evaluate her reflux. Note sent to Dr. Henrene Pastor regarding this and will notify pt.

## 2018-03-30 ENCOUNTER — Ambulatory Visit: Payer: Medicare HMO | Admitting: Family Medicine

## 2018-03-30 VITALS — BP 112/89 | HR 90 | Ht 67.0 in | Wt 207.0 lb

## 2018-03-30 DIAGNOSIS — Z23 Encounter for immunization: Secondary | ICD-10-CM

## 2018-03-30 DIAGNOSIS — B351 Tinea unguium: Secondary | ICD-10-CM | POA: Diagnosis not present

## 2018-03-30 DIAGNOSIS — K219 Gastro-esophageal reflux disease without esophagitis: Secondary | ICD-10-CM | POA: Diagnosis not present

## 2018-03-30 DIAGNOSIS — R69 Illness, unspecified: Secondary | ICD-10-CM | POA: Diagnosis not present

## 2018-03-30 MED ORDER — PANTOPRAZOLE SODIUM 40 MG PO TBEC: 40.0000 mg | DELAYED_RELEASE_TABLET | Freq: Two times a day (BID) | ORAL | 3 refills | Status: DC

## 2018-03-30 MED ORDER — EPINEPHRINE 0.3 MG/0.3ML IJ SOSY: 1.0000 | PREFILLED_SYRINGE | Freq: Once | INTRAMUSCULAR | 1 refills | Status: AC

## 2018-03-30 MED ORDER — TERBINAFINE HCL 250 MG PO TABS: 250.0000 mg | ORAL_TABLET | Freq: Every day | ORAL | 0 refills | Status: DC

## 2018-03-30 NOTE — Progress Notes (Signed)
AUBREIGH FUERTE is a 52 y.o. female who presents to [facility]: [place] today for toe fungus and reflux.   Toe: She was seen 3 months ago with onychomycosis and was given a 3 month course of terbinafine. She is improving on the medicine. She says she still has a little bit of pain at times but generally is feeling much better.   Reflux: she is 1 year s/p gastric sleeve. She has been experiencing severe reflux. She says that she gets symptoms about an hour after she eats, every time she eats. She was on omeprazole once daily and was then switched to Protonix because the omeprazole was not providing any relief.     ROS as above:  Exam:  BP 112/89   Pulse 90   Ht 5\' 7"  (1.702 m)   Wt 207 lb (93.9 kg)   LMP 08/22/2016 (Approximate) Comment: preg test waiver signed  BMI 32.42 kg/m  Wt Readings from Last 5 Encounters:  03/30/18 207 lb (93.9 kg)  03/19/18 211 lb (95.7 kg)  01/26/18 219 lb (99.3 kg)  01/18/18 204 lb (92.5 kg)  12/22/17 213 lb (96.6 kg)    Gen: Well NAD HEENT: EOMI,  MMM Lungs: Normal work of breathing. CTABL Heart: RRR no MRG Abd: NABS, Soft. Nondistended, Nontender Exts: Brisk capillary refill, warm and well perfused.  Left great toe: nail cut short, some crusting at nail line. No obvious deformities  Non tender to palpation    Lab and Radiology Results   Chemistry      Component Value Date/Time   NA 139 03/05/2018 1203   NA 138 02/26/2016   K 4.5 03/05/2018 1203   CL 103 03/05/2018 1203   CO2 30 03/05/2018 1203   BUN 14 03/05/2018 1203   CREATININE 0.89 03/05/2018 1203   GLU 91 02/26/2016      Component Value Date/Time   CALCIUM 9.6 03/05/2018 1203   ALKPHOS 72 03/31/2017 0514   AST 16 03/05/2018 1203   ALT 10 03/05/2018 1203   BILITOT 0.4 03/05/2018 1203        Assessment and Plan: 52 y.o. female with onychomycosis and acid  reflux.   Onychomycosis: she is improving after 3 months of Terbinafine. She still has some evidence of fungal infection on exam and she is also still reporting some pain. The plan will be to complete another 3 months of Terbinafine. It is expected to be 3-6 more months until it is completely resolved.  Reflux: her reflux is not improving following the switch from omeprazole to Protonix.  She will increase the Protonix to twice daily and also add over the counter Zantac as needed. She is also due to see her gastric surgeon this month. She is scheduled to get a colonoscopy and upper endoscopy on Oct 16.   Influenza vaccination given today    Orders Placed This Encounter  Procedures  . Flu Vaccine QUAD 36+ mos IM   Meds ordered this encounter  Medications  . pantoprazole (PROTONIX) 40 MG tablet    Sig: Take 1 tablet (40 mg total) by mouth 2 (two) times daily.    Dispense:  180 tablet    Refill:  3  . terbinafine (LAMISIL) 250 MG tablet    Sig: Take 1 tablet (250 mg total) by mouth daily.    Dispense:  90 tablet    Refill:  0  . EPINEPHrine 0.3 MG/0.3ML SOSY    Sig:  Inject 0.3 mLs (0.3 mg total) as directed once for 1 dose.    Dispense:  2 each    Refill:  1    OK to dispense generic .     Historical information moved to improve visibility of documentation.  Past Medical History:  Diagnosis Date  . Allergy   . Anxiety   . Arthritis   . Asthma    History of Asthma  . Bipolar disorder ([place])   . Chest pain, atypical 11/30/2012  . Clotting disorder ([place])   . Depression   . Diabetes mellitus without complication ([place])    type 2, pre-diabetic before she lost weight  . GERD (gastroesophageal reflux disease)   . H/O degenerative disc disease    L4-L5, L5-S1  . Heart murmur   . Hyperglycemia    Postoperative hyperglycemia  . Hypertension    hx of  . 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 Obesity 07/14/2012  . OSA (obstructive sleep  apnea)    mild  . Pneumonia   . Polycystic disease, ovaries   . Postoperative anemia    2018 - while dieting  hx of  . Reflux   . Sinus tachycardia 07/14/2012   Pt says HR > 100 is consistent  . Sleep apnea    has had two tests, different results- no CPAP used   Past Surgical History:  Procedure Laterality Date  . COLONOSCOPY    . FOOT SURGERY     Right plantar facsiitis scrape  . KNEE SURGERY Bilateral 2007  . LAPAROSCOPIC GASTRIC SLEEVE RESECTION N/A 03/30/2017   Procedure: LAPAROSCOPIC GASTRIC SLEEVE RESECTION, UPPER ENDO;  Surgeon: Greer Pickerel, MD;  Location: [facility];  Service: General;  Laterality: N/A;  . NASAL SINUS SURGERY  2004  . OVARIAN CYST REMOVAL  1998   A cyst on the fallopian tube removed  . SPINAL FUSION    . TONSILLECTOMY  1996   Social History   Tobacco Use  . Smoking status: Never Smoker  . Smokeless tobacco: Never Used  Substance Use Topics  . Alcohol use: No   family history includes Cancer in her father, maternal grandmother, paternal aunt, paternal aunt, paternal grandmother, and paternal uncle; Colon cancer in her maternal grandmother; Diabetes in her father, mother, and paternal aunt.  Medications: Current Outpatient Medications  Medication Sig Dispense Refill  . albuterol (PROAIR HFA) 108 (90 BASE) MCG/ACT inhaler Inhale 2 puffs into the lungs every 4 (four) hours as needed for wheezing or shortness of breath.    . ALPRAZolam (XANAX) 1 MG tablet Take 1 mg by mouth 3 (three) times daily as needed (for anxiety).    . budesonide-formoterol (SYMBICORT) 160-4.5 MCG/ACT inhaler Inhale 2 puffs into the lungs 2 (two) times daily. 3 Inhaler 4  . buPROPion (WELLBUTRIN XL) 150 MG 24 hr tablet Take 150 mg by mouth daily with breakfast. Total daily dose=450mg     . buPROPion (WELLBUTRIN XL) 300 MG 24 hr tablet Take 300 mg by mouth daily with breakfast. Total daily dose=450mg     . Calcium Citrate-Vitamin D (CELEBRATE CALCIUM PLUS 500) 500-333 MG-UNIT CHEW Chew 1  tablet by mouth 3 (three) times daily.    . cyclobenzaprine (FLEXERIL) 10 MG tablet Take 10 mg by mouth 3 (three) times daily as needed for muscle spasms.     Marland Kitchen docusate sodium (COLACE) 100 MG capsule Take 100 mg by mouth 2 (two) times daily.    Marland Kitchen escitalopram (  LEXAPRO) 10 MG tablet Take 10 mg by mouth daily with breakfast.     . estradiol (ESTRACE) 1 MG tablet Take 1 tablet (1 mg total) by mouth daily. 90 tablet 3  . fentaNYL (DURAGESIC - DOSED MCG/HR) 50 MCG/HR Place 50 mcg onto the skin every 3 (three) days.     . fluticasone (FLONASE) 50 MCG/ACT nasal spray Place 1 spray into the nose daily as needed (for allergies.).     Marland Kitchen HYDROmorphone (DILAUDID) 2 MG tablet Take 2 mg by mouth 4 (four) times daily.     . medroxyPROGESTERone (PROVERA) 2.5 MG tablet Take 1 tablet (2.5 mg total) by mouth daily. 90 tablet 3  . MOVANTIK 25 MG TABS tablet Take 25 mg by mouth at bedtime.     . Multiple Vitamins-Minerals (CELEBRATE MULTI-COMPLETE 36) CHEW Chew 1 tablet by mouth 2 (two) times daily.    . nortriptyline (PAMELOR) 25 MG capsule Take 50 mg by mouth at bedtime.    . pantoprazole (PROTONIX) 40 MG tablet Take 1 tablet (40 mg total) by mouth 2 (two) times daily. 180 tablet 3  . PREMPRO 0.625-2.5 MG tablet     . scopolamine (TRANSDERM-SCOP, 1.5 MG,) 1 MG/3DAYS Place 1 patch (1.5 mg total) onto the skin every 3 (three) days. 4 patch 3  . Sennosides (SENOKOT PO) Take by mouth. 2 tablets at night    . traZODone (DESYREL) 100 MG tablet Take 300 mg by mouth at bedtime.     . valACYclovir (VALTREX) 1000 MG tablet 1 tablet daily 31 tablet 12  . EPINEPHrine 0.3 MG/0.3ML SOSY Inject 0.3 mLs (0.3 mg total) as directed once for 1 dose. 2 each 1  . terbinafine (LAMISIL) 250 MG tablet Take 1 tablet (250 mg total) by mouth daily. 90 tablet 0   No current facility-administered medications for this visit.    Allergies  Allergen Reactions  . Gabapentin Nausea And Vomiting and Anaphylaxis  . Pineapple Shortness Of  Breath and Swelling    Throat swells  . Shellfish-Derived Products Anaphylaxis  . Erythromycin Swelling  . Latex Rash    Hives  . Pregabalin Swelling  . Duloxetine Other (See Comments)    Confusion/dizziness  . Other     Makes throat close  . Pollen Extract Other (See Comments)    Stuffy  Nose     Discussed warning signs or symptoms. Please see discharge instructions. Patient expresses understanding.  I personally was present and performed or re-performed the history, physical exam and medical decision-making activities of this service and have verified that the service and findings are accurately documented in the student's note. ___________________________________________ [name] M.D., ABFM., CAQSM. Primary Care and Sports Medicine Adjunct Instructor of [place] [employer]

## 2018-03-30 NOTE — Patient Instructions (Addendum)
Thank you for coming in today. Continue terbinafine  Recheck in 3 months.   Increase protonix to 2x daily.  Ok to take with zantac.  I do think its a good idea to talk with your stomach surgeon.

## 2018-04-05 ENCOUNTER — Encounter: Payer: Self-pay | Admitting: Internal Medicine

## 2018-04-07 ENCOUNTER — Encounter: Payer: Self-pay | Admitting: Internal Medicine

## 2018-04-13 ENCOUNTER — Telehealth: Payer: Self-pay | Admitting: Adult Health

## 2018-04-13 ENCOUNTER — Inpatient Hospital Stay: Payer: Medicare HMO | Attending: Adult Health | Admitting: Adult Health

## 2018-04-13 ENCOUNTER — Encounter: Payer: Self-pay | Admitting: Adult Health

## 2018-04-13 VITALS — BP 121/76 | HR 102 | Temp 98.3°F | Resp 18 | Ht 67.0 in | Wt 211.1 lb

## 2018-04-13 DIAGNOSIS — Z9884 Bariatric surgery status: Secondary | ICD-10-CM | POA: Diagnosis not present

## 2018-04-13 DIAGNOSIS — Z793 Long term (current) use of hormonal contraceptives: Secondary | ICD-10-CM | POA: Diagnosis not present

## 2018-04-13 DIAGNOSIS — Z8249 Family history of ischemic heart disease and other diseases of the circulatory system: Secondary | ICD-10-CM

## 2018-04-13 NOTE — Progress Notes (Addendum)
[place]  [phone] [phone]     ID: [name] DOB: 1966/04/05  MR#: [id number]  YJE#:[id number]  Patient Care Team: Gregor Hams, MD as PCP - General (Family Medicine) Margaretha Sheffield, MD as Referring Physician (Physical Medicine and Rehabilitation) Norma Fredrickson, MD as Consulting Physician (Psychiatry) Scot Dock, NP OTHER MD:  CHIEF COMPLAINT: Family history of blood clots  CURRENT TREATMENT: observation   HISTORY OF CURRENT ILLNESS: Per Dr. Clydene Laming note on 04/13/2017:  52 y.o. female with history of severe thrombophilia/thromboembolic disease in her father, but without any other family history or personal history of confirmed thrombosis. Evaluation requested in anticipation of undergoing bariatric surgery. Due to history of recurrent swelling of the right lower extremity we have obtained a Doppler ultrasounds which revealed no evidence of active deep vein thrombosis. Additionally, we have obtained hemophilia panel including factor V Leiden, prothrombin gene mutation, etc. testing is completely negative for identifiable prothrombotic condition at this point in time.  The patient's subsequent history is as detailed below.  [name] is here today to transition her care from Dr. Lebron Conners, to Dr. Jana Hakim.  We reviewed her family history which includes her father, paternal aunt, maternal grandmother, and paternal uncle having history of DVTs. Her dad has also been diagnosed with CLL.  He has not had a hypercoagulable panel done.  [name] has undergone a panel, and was seen last year after undergoing gastric sleeve surgery that she tolerated well.  She did receive Enoxaparin 60m subq q12h.  She did not get a clot at that point.  She also has a longstanding history of OCP use from ages [age]  She is currently taking Estradiol and Progesterone.  She has also had knee surgery and tonsillectomy. She has no personal history of clot.  INTERVAL  HISTORY: [name] doing well today.  She notes that she had a scrape 4 weeks ago and is still bruised.  She sees her PCP regularly and has routine labs done.  She is doing well otherwise.    REVIEW OF SYSTEMS:  [name] any new headaches or vision changes.  She is without any chest pain, cough, palpitations, shortness of breath.  She has chronic lower back pain and follows with pain management clinic.  She denies any indigestion, dysphagia, bowel/bladder changes, nausea or vomiting.  She denies any swelling, night sweats, fatigue, or lymphadenopathy.    PAST MEDICAL HISTORY: Past Medical History:  Diagnosis Date  . Allergy   . Anxiety   . Arthritis   . Asthma    History of Asthma  . Bipolar disorder ([facility]   . Chest pain, atypical 11/30/2012  . Clotting disorder ([facility]   . Depression   . Diabetes mellitus without complication ([place]    type 2, pre-diabetic before she lost weight  . GERD (gastroesophageal reflux disease)   . H/O degenerative disc disease    L4-L5, L5-S1  . Heart murmur   . Hyperglycemia    Postoperative hyperglycemia  . Hypertension    hx of  . 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 Obesity 07/14/2012  . OSA (obstructive sleep apnea)    mild  . Pneumonia   . Polycystic disease, ovaries   . Postoperative anemia    2018 - while dieting  hx of  . Reflux   . Sinus tachycardia 07/14/2012   Pt says HR > 100 is consistent  . Sleep apnea  has had two tests, different results- no CPAP used    PAST SURGICAL HISTORY: Past Surgical History:  Procedure Laterality Date  . COLONOSCOPY    . FOOT SURGERY     Right plantar facsiitis scrape  . KNEE SURGERY Bilateral 2007  . LAPAROSCOPIC GASTRIC SLEEVE RESECTION N/A 03/30/2017   Procedure: LAPAROSCOPIC GASTRIC SLEEVE RESECTION, UPPER ENDO;  Surgeon: Greer Pickerel, MD;  Location: [facility];  Service: General;  Laterality: N/A;  . NASAL SINUS SURGERY  2004  . OVARIAN CYST REMOVAL   1998   A cyst on the fallopian tube removed  . SPINAL FUSION    . TONSILLECTOMY  1996    FAMILY HISTORY Family History  Problem Relation Age of Onset  . Cancer Father        Leukemia  . Diabetes Father   . Diabetes Mother   . Cancer Maternal Grandmother        Stomach  . Colon cancer Maternal Grandmother   . Cancer Paternal Grandmother   . Cancer Paternal Aunt   . Diabetes Paternal Aunt   . Cancer Paternal Uncle   . Cancer Paternal Aunt   . Rectal cancer Neg Hx   . Stomach cancer Neg Hx   . Esophageal cancer Neg Hx     GYNECOLOGIC HISTORY:  Patient's last menstrual period was 08/22/2016 (approximate). Menarche: 52 years old Has no children G1 P 0 LMP 2017 Contraceptive used OCP ages [age] HRT on estrace and provera daily Hysterectomy? no SO? no + PCOS 20 years ago   SOCIAL HISTORY:  [name] is 16 married and lives with her husband and dog in [place], [place].  Her stepson just went to [place] as a Museum/gallery exhibitions officer.  Agape is on disability for lower back issues requiring fusion in her lower back and degenerative disk disease, and arthritis.  She enjoys her new puppy, a shi zhu and maltese mix.    ADVANCED DIRECTIVES:  Not in place  HEALTH MAINTENANCE: Social History   Tobacco Use  . Smoking status: Never Smoker  . Smokeless tobacco: Never Used  Substance Use Topics  . Alcohol use: No  . Drug use: No     Colonoscopy: due on 04/21/18  PAP: 2018  Bone density: not at this time   Allergies  Allergen Reactions  . Gabapentin Nausea And Vomiting and Anaphylaxis  . Pineapple Shortness Of Breath and Swelling    Throat swells  . Shellfish-Derived Products Anaphylaxis  . Erythromycin Swelling  . Latex Rash    Hives  . Pregabalin Swelling  . Duloxetine Other (See Comments)    Confusion/dizziness  . Other     Makes throat close  . Pollen Extract Other (See Comments)    Stuffy  Nose    Current Outpatient Medications  Medication Sig Dispense  Refill  . albuterol (PROAIR HFA) 108 (90 BASE) MCG/ACT inhaler Inhale 2 puffs into the lungs every 4 (four) hours as needed for wheezing or shortness of breath.    . ALPRAZolam (XANAX) 1 MG tablet Take 1 mg by mouth 3 (three) times daily as needed (for anxiety).    . budesonide-formoterol (SYMBICORT) 160-4.5 MCG/ACT inhaler Inhale 2 puffs into the lungs 2 (two) times daily. 3 Inhaler 4  . buPROPion (WELLBUTRIN XL) 150 MG 24 hr tablet Take 150 mg by mouth daily with breakfast. Total daily dose=478m    . buPROPion (WELLBUTRIN XL) 300 MG 24 hr tablet Take 300 mg by mouth daily with breakfast. Total daily dose=4545m   .  Calcium Citrate-Vitamin D (CELEBRATE CALCIUM PLUS 500) 500-333 MG-UNIT CHEW Chew 1 tablet by mouth 3 (three) times daily.    . cyclobenzaprine (FLEXERIL) 10 MG tablet Take 10 mg by mouth 3 (three) times daily as needed for muscle spasms.     Marland Kitchen docusate sodium (COLACE) 100 MG capsule Take 100 mg by mouth 2 (two) times daily.    Marland Kitchen escitalopram (LEXAPRO) 20 MG tablet Take 20 mg by mouth daily with breakfast.     . estradiol (ESTRACE) 1 MG tablet Take 1 tablet (1 mg total) by mouth daily. 90 tablet 3  . fentaNYL (DURAGESIC - DOSED MCG/HR) 50 MCG/HR Place 50 mcg onto the skin every 3 (three) days.     . fluticasone (FLONASE) 50 MCG/ACT nasal spray Place 1 spray into the nose daily as needed (for allergies.).     Marland Kitchen HYDROmorphone (DILAUDID) 2 MG tablet Take 2 mg by mouth 4 (four) times daily.     . medroxyPROGESTERone (PROVERA) 2.5 MG tablet Take 1 tablet (2.5 mg total) by mouth daily. 90 tablet 3  . MOVANTIK 25 MG TABS tablet Take 25 mg by mouth at bedtime.     . Multiple Vitamins-Minerals (CELEBRATE MULTI-COMPLETE 36) CHEW Chew 1 tablet by mouth 2 (two) times daily.    . nortriptyline (PAMELOR) 25 MG capsule Take 50 mg by mouth at bedtime.    . pantoprazole (PROTONIX) 40 MG tablet Take 1 tablet (40 mg total) by mouth 2 (two) times daily. 180 tablet 3  . scopolamine (TRANSDERM-SCOP, 1.5  MG,) 1 MG/3DAYS Place 1 patch (1.5 mg total) onto the skin every 3 (three) days. 4 patch 3  . Sennosides (SENOKOT PO) Take by mouth. 2 tablets at night    . terbinafine (LAMISIL) 250 MG tablet Take 1 tablet (250 mg total) by mouth daily. 90 tablet 0  . traZODone (DESYREL) 100 MG tablet Take 300 mg by mouth at bedtime.     . valACYclovir (VALTREX) 1000 MG tablet 1 tablet daily 31 tablet 12   No current facility-administered medications for this visit.     OBJECTIVE:  Vitals:   04/13/18 1114  BP: 121/76  Pulse: (!) 102  Resp: 18  Temp: 98.3 F (36.8 C)  SpO2: 97%     Body mass index is 33.06 kg/m.   Wt Readings from Last 3 Encounters:  04/13/18 211 lb 1.6 oz (95.8 kg)  03/30/18 207 lb (93.9 kg)  03/19/18 211 lb (95.7 kg)  ECOG FS:0 - Asymptomatic GENERAL: Patient is a well appearing female in no acute distress HEENT:  Sclerae anicteric.  Oropharynx clear and moist. No ulcerations or evidence of oropharyngeal candidiasis. Neck is supple.  NODES:  No cervical, supraclavicular, or axillary lymphadenopathy palpated.  BREAST EXAM:  Deferred. LUNGS:  Clear to auscultation bilaterally.  No wheezes or rhonchi. HEART:  Regular rate and rhythm. No murmur appreciated. ABDOMEN:  Soft, nontender.  Positive, normoactive bowel sounds. No organomegaly palpated. MSK:  No focal spinal tenderness to palpation. Full range of motion bilaterally in the upper extremities. EXTREMITIES:  No peripheral edema.   SKIN:  Clear with no obvious rashes or skin changes. No nail dyscrasia. Circular area of previous ecchymosis noted on left posterior lower leg NEURO:  Nonfocal. Well oriented.  Appropriate affect.     LAB RESULTS:  CMP     Component Value Date/Time   NA 139 03/05/2018 1203   NA 138 02/26/2016   K 4.5 03/05/2018 1203   CL 103 03/05/2018 1203  CO2 30 03/05/2018 1203   GLUCOSE 84 03/05/2018 1203   BUN 14 03/05/2018 1203   CREATININE 0.89 03/05/2018 1203   CALCIUM 9.6 03/05/2018 1203    PROT 6.9 03/05/2018 1203   ALBUMIN 4.1 03/31/2017 0514   AST 16 03/05/2018 1203   ALT 10 03/05/2018 1203   ALKPHOS 72 03/31/2017 0514   BILITOT 0.4 03/05/2018 1203   GFRNONAA 76 03/05/2018 1203   GFRAA 88 03/05/2018 1203    No results found for: TOTALPROTELP, ALBUMINELP, A1GS, A2GS, BETS, BETA2SER, GAMS, MSPIKE, SPEI  No results found for: Nils Pyle, [facility]  Lab Results  Component Value Date   WBC 8.2 03/05/2018   NEUTROABS 4,846 03/05/2018   HGB 13.5 03/05/2018   HCT 40.5 03/05/2018   MCV 85.6 03/05/2018   PLT 259 03/05/2018      Chemistry      Component Value Date/Time   NA 139 03/05/2018 1203   NA 138 02/26/2016   K 4.5 03/05/2018 1203   CL 103 03/05/2018 1203   CO2 30 03/05/2018 1203   BUN 14 03/05/2018 1203   CREATININE 0.89 03/05/2018 1203   GLU 91 02/26/2016      Component Value Date/Time   CALCIUM 9.6 03/05/2018 1203   ALKPHOS 72 03/31/2017 0514   AST 16 03/05/2018 1203   ALT 10 03/05/2018 1203   BILITOT 0.4 03/05/2018 1203       No results found for: LABCA2  No components found for: RCVELF810  No results for input(s): INR in the last 168 hours.  No results found for: LABCA2  No results found for: FBP102  No results found for: HEN277  No results found for: OEU235  No results found for: CA2729  No components found for: HGQUANT  No results found for: CEA1 / No results found for: CEA1   No results found for: AFPTUMOR  No results found for: CHROMOGRNA  No results found for: PSA1  No visits with results within 3 Day(s) from this visit.  Latest known visit with results is:  Abstract on 03/11/2018  Component Date Value Ref Range Status  . Cologuard 02/26/2018 Positive   Final    (this displays the last labs from the last 3 days)  No results found for: TOTALPROTELP, ALBUMINELP, A1GS, A2GS, BETS, BETA2SER, GAMS, MSPIKE, SPEI (this displays SPEP labs)  No results found for: KPAFRELGTCHN, LAMBDASER,  KAPLAMBRATIO (kappa/lambda light chains)  No results found for: HGBA, HGBA2QUANT, HGBFQUANT, HGBSQUAN (Hemoglobinopathy evaluation)   No results found for: LDH  Lab Results  Component Value Date   IRON 54 03/03/2016   TIBC 470 (H) 03/03/2016   IRONPCTSAT 11 03/03/2016   (Iron and TIBC)  Lab Results  Component Value Date   FERRITIN 25 03/03/2016    Urinalysis No results found for: COLORURINE, APPEARANCEUR, LABSPEC, PHURINE, GLUCOSEU, HGBUR, BILIRUBINUR, KETONESUR, PROTEINUR, UROBILINOGEN, NITRITE, LEUKOCYTESUR   STUDIES: No results found.   ASSESSMENT: 52 y.o. [place] woman who has family history concerning for hypercoagulability but no personal history of blood clots.    1. Hypercoagulable panel on 03/18/2017 negative  PLAN:  Birdia met with Dr. Jana Hakim today about her risk and concern for hypercoagulability.  He reviewed her results that were negative for the hypercoagulable work up done by Dr. Lebron Conners last year.  He reviewed the fact that she has had several surgeries and has not developed a blood clot.  He reviewed that she has been on OCP for 30+ years without developing a blood clot.  He reviewed that  he thinks that if she was going to get a blood clot, she would have likely already developed one.  We cannot find any evidence of a hypercoagulable work up done on her father.  It would be beneficial if this was done, because if he was positive, then we know she is negative, and she would have nothing to be concerned about.  Dr. Jana Hakim reviewed with her recommendations to decrease her risk for blood clots in the future:  1.  Consider discontinuation of estrogen/progesterone  2. Exercise 45 minutes 5 times per week and avoid prolonged inactivity (during travel, sickness, etc)  3. Consider baby aspirin daily ?  Quisha verbalizes understanding of the above recommendations.  She will consider these.  She will return to see [place] on an as needed basis.  She will call  with any problems that may develop before her next visit here.  A total of (30) minutes of face-to-face time was spent with this patient with greater than 50% of that time in counseling and care-coordination.   Scot Dock, NP   04/13/2018 11:49 AM Medical Oncology and Hematology [facility] [address] [place], [place] [address] Tel. [phone]    Fax. [phone]   ADDENDUM: This 52 year old [place] woman has a significant history of coagulopathy on her father's side of the family.  She herself however has had major surgeries including gastric bypass with no clotting complications.  She took oral contraceptives for decades and is currently receiving hormone replacement again without any DVT developing.  She also has a hypercoagulable panel which is entirely normal.  I do not have any evidence that she is at increased risk of clotting.  If there is a mutation in her father side of the family she does not appear to have inherited it.  If she wanted to lower her risk of developing abnormal clotting I would suggest continuing weight loss (her goal is 170 pounds), regular exercise, avoiding prolonged immobility, and discontinuing hormone replacement.  We do not have enough data to recommend a baby aspirin but if her primary care physician felt this was warranted for cardio prevention that would also attempt to lower the risk of arterial clotting.  I reassured Adisynn regarding her situation and let her know that we will be glad to see her again at any point in the future if and when the need arises.  We did not make a routine return appointment for her here.  I personally saw this patient and performed a substantive portion of this encounter with the listed APP documented above.   Chauncey Cruel, MD Medical Oncology and Hematology [facility] [address] [place],  [address] Tel. [phone]    Fax. [phone]

## 2018-04-13 NOTE — Telephone Encounter (Signed)
Per 10/8 los, no orders.

## 2018-04-14 DIAGNOSIS — M47816 Spondylosis without myelopathy or radiculopathy, lumbar region: Secondary | ICD-10-CM | POA: Diagnosis not present

## 2018-04-14 DIAGNOSIS — M62830 Muscle spasm of back: Secondary | ICD-10-CM | POA: Diagnosis not present

## 2018-04-14 DIAGNOSIS — G894 Chronic pain syndrome: Secondary | ICD-10-CM | POA: Diagnosis not present

## 2018-04-14 DIAGNOSIS — K5900 Constipation, unspecified: Secondary | ICD-10-CM | POA: Diagnosis not present

## 2018-04-21 ENCOUNTER — Ambulatory Visit (AMBULATORY_SURGERY_CENTER): Payer: Medicare HMO | Admitting: Internal Medicine

## 2018-04-21 ENCOUNTER — Encounter

## 2018-04-21 ENCOUNTER — Encounter: Payer: Self-pay | Admitting: Internal Medicine

## 2018-04-21 VITALS — BP 134/62 | HR 67 | Temp 99.3°F | Resp 13 | Ht 67.0 in | Wt 211.0 lb

## 2018-04-21 DIAGNOSIS — Z1211 Encounter for screening for malignant neoplasm of colon: Secondary | ICD-10-CM | POA: Diagnosis not present

## 2018-04-21 DIAGNOSIS — K219 Gastro-esophageal reflux disease without esophagitis: Secondary | ICD-10-CM | POA: Diagnosis not present

## 2018-04-21 DIAGNOSIS — D124 Benign neoplasm of descending colon: Secondary | ICD-10-CM

## 2018-04-21 DIAGNOSIS — R195 Other fecal abnormalities: Secondary | ICD-10-CM

## 2018-04-21 MED ORDER — SODIUM CHLORIDE 0.9 % IV SOLN: 500.0000 mL | Freq: Once | INTRAVENOUS | Status: DC

## 2018-04-21 NOTE — Patient Instructions (Signed)
YOU HAD AN ENDOSCOPIC PROCEDURE TODAY AT THE [facility]:   Refer to the procedure report that was given to you for any specific questions about what was found during the examination.  If the procedure report does not answer your questions, please call your gastroenterologist to clarify.  If you requested that your care partner not be given the details of your procedure findings, then the procedure report has been included in a sealed envelope for you to review at your convenience later.  YOU SHOULD EXPECT: Some feelings of bloating in the abdomen. Passage of more gas than usual.  Walking can help get rid of the air that was put into your GI tract during the procedure and reduce the bloating. If you had a lower endoscopy (such as a colonoscopy or flexible sigmoidoscopy) you may notice spotting of blood in your stool or on the toilet paper. If you underwent a bowel prep for your procedure, you may not have a normal bowel movement for a few days.  Please Note:  You might notice some irritation and congestion in your nose or some drainage.  This is from the oxygen used during your procedure.  There is no need for concern and it should clear up in a day or so.  SYMPTOMS TO REPORT IMMEDIATELY:   Following lower endoscopy (colonoscopy or flexible sigmoidoscopy):  Excessive amounts of blood in the stool  Significant tenderness or worsening of abdominal pains  Swelling of the abdomen that is new, acute  Fever of 100F or higher   Following upper endoscopy (EGD)  Vomiting of blood or coffee ground material  New chest pain or pain under the shoulder blades  Painful or persistently difficult swallowing  New shortness of breath  Fever of 100F or higher  Black, tarry-looking stools  For urgent or emergent issues, a gastroenterologist can be reached at any hour by calling [phone].   DIET:  We do recommend a small meal at first, but then you may proceed to your regular diet.  Drink  plenty of fluids but you should avoid alcoholic beverages for 24 hours.  ACTIVITY:  You should plan to take it easy for the rest of today and you should NOT DRIVE or use heavy machinery until tomorrow (because of the sedation medicines used during the test).    FOLLOW UP: Our staff will call the number listed on your records the next business day following your procedure to check on you and address any questions or concerns that you may have regarding the information given to you following your procedure. If we do not reach you, we will leave a message.  However, if you are feeling well and you are not experiencing any problems, there is no need to return our call.  We will assume that you have returned to your regular daily activities without incident.  If any biopsies were taken you will be contacted by phone or by letter within the next 1-3 weeks.  Please call us at [phone] if you have not heard about the biopsies in 3 weeks.    SIGNATURES/CONFIDENTIALITY: You and/or your care partner have signed paperwork which will be entered into your electronic medical record.  These signatures attest to the fact that that the information above on your After Visit Summary has been reviewed and is understood.  Full responsibility of the confidentiality of this discharge information lies with you and/or your care-partner.  Polyp information given. 

## 2018-04-21 NOTE — Op Note (Signed)
[place] Patient Name: [name] Procedure Date: 04/21/2018 3:32 PM MRN: [id number] Endoscopist: Docia Chuck. Henrene Pastor , MD Age: 52 Referring MD:  Date of Birth: [birth year] Gender: Female Account #: [id number] Procedure:                Colonoscopy with cold snare polypectomy x 1 Indications:              Positive Cologuard test. Negative colonoscopy                            elsewhere May 27, 2013 Medicines:                Monitored Anesthesia Care Procedure:                Pre-Anesthesia Assessment:                           - Prior to the procedure, a History and Physical                            was performed, and patient medications and                            allergies were reviewed. The patient's tolerance of                            previous anesthesia was also reviewed. The risks                            and benefits of the procedure and the sedation                            options and risks were discussed with the patient.                            All questions were answered, and informed consent                            was obtained. Prior Anticoagulants: The patient has                            taken no previous anticoagulant or antiplatelet                            agents. ASA Grade Assessment: II - A patient with                            mild systemic disease. After reviewing the risks                            and benefits, the patient was deemed in                            satisfactory condition to undergo the procedure.  After obtaining informed consent, the colonoscope                            was passed under direct vision. Throughout the                            procedure, the patient's blood pressure, pulse, and                            oxygen saturations were monitored continuously. The                            Colonoscope was introduced through the anus and   advanced to the the cecum, identified by                            appendiceal orifice and ileocecal valve. The                            ileocecal valve, appendiceal orifice, and rectum                            were photographed. The quality of the bowel                            preparation was excellent. The colonoscopy was                            performed without difficulty. The patient tolerated                            the procedure well. The bowel preparation used was                            SUPREP. Scope In: 3:40:47 PM Scope Out: 3:57:04 PM Scope Withdrawal Time: 0 hours 11 minutes 2 seconds  Total Procedure Duration: 0 hours 16 minutes 17 seconds  Findings:                 A 3 mm polyp was found in the descending colon. The                            polyp was removed with a cold snare. Resection and                            retrieval were complete.                           Internal hemorrhoids were found during                            retroflexion. The hemorrhoids were small.                           The exam was otherwise without abnormality on  direct and retroflexion views. Complications:            No immediate complications. Estimated blood loss:                            None. Estimated Blood Loss:     Estimated blood loss: none. Impression:               - One 3 mm polyp in the descending colon, removed                            with a cold snare. Resected and retrieved.                           - Internal hemorrhoids.                           - The examination was otherwise normal on direct                            and retroflexion views. Recommendation:           - Repeat colonoscopy in 5 years for surveillance.                           - Patient has a contact number available for                            emergencies. The signs and symptoms of potential                            delayed complications were  discussed with the                            patient. Return to normal activities tomorrow.                            Written discharge instructions were provided to the                            patient.                           - Resume previous diet.                           - Continue present medications.                           - Await pathology results. Docia Chuck. Henrene Pastor, MD 04/21/2018 4:01:44 PM This report has been signed electronically.

## 2018-04-21 NOTE — Op Note (Signed)
[facility] Patient Name: [name] Procedure Date: 04/21/2018 3:32 PM MRN: [id number] Endoscopist: Docia Chuck. Henrene Pastor , MD Age: 52 Referring MD:  Date of Birth: Aug 09, 1965 Gender: Female Account #: [id number] Procedure:                Upper GI endoscopy Indications:              Esophageal reflux Medicines:                Monitored Anesthesia Care Procedure:                Pre-Anesthesia Assessment:                           - Prior to the procedure, a History and Physical                            was performed, and patient medications and                            allergies were reviewed. The patient's tolerance of                            previous anesthesia was also reviewed. The risks                            and benefits of the procedure and the sedation                            options and risks were discussed with the patient.                            All questions were answered, and informed consent                            was obtained. Prior Anticoagulants: The patient has                            taken no previous anticoagulant or antiplatelet                            agents. ASA Grade Assessment: II - A patient with                            mild systemic disease. After reviewing the risks                            and benefits, the patient was deemed in                            satisfactory condition to undergo the procedure.                           After obtaining informed consent, the endoscope was  passed under direct vision. Throughout the                            procedure, the patient's blood pressure, pulse, and                            oxygen saturations were monitored continuously. The                            Model GIF-[id number]) scope was introduced                            through the mouth, and advanced to the second part                            of duodenum. The upper GI endoscopy  was                            accomplished without difficulty. The patient                            tolerated the procedure well. Scope In: Scope Out: Findings:                 The esophagus was normal.                           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                           The examined duodenum was normal.                           The cardia and gastric fundus were normal on                            retroflexion. Complications:            No immediate complications. Estimated Blood Loss:     Estimated blood loss: none. Impression:               Dermal anatomy post sleeve gastrectomy. Recommendation:           - Patient has a contact number available for                            emergencies. The signs and symptoms of potential                            delayed complications were discussed with the                            patient. Return to normal activities tomorrow.                            Written discharge instructions were provided to the  patient.                           - Resume previous diet.                           - Continue present medications. Docia Chuck. Henrene Pastor, MD 04/21/2018 4:09:52 PM This report has been signed electronically.

## 2018-04-21 NOTE — Progress Notes (Signed)
Called to room to assist during endoscopic procedure.  Patient ID and intended procedure confirmed with present staff. Received instructions for my participation in the procedure from the performing physician.  

## 2018-04-21 NOTE — Progress Notes (Signed)
A and O x3. Report to RN. Tolerated MAC anesthesia well.Teeth unchanged after procedure.

## 2018-04-21 NOTE — Progress Notes (Signed)
Pt's states no medical or surgical changes since previsit or office visit. 

## 2018-04-22 ENCOUNTER — Telehealth: Payer: Self-pay

## 2018-04-22 NOTE — Telephone Encounter (Signed)
  Follow up Call-  Call back number 04/21/2018  Post procedure Call Back phone  # [phone]  Permission to leave phone message Yes  Some recent data might be hidden     Patient questions:  Do you have a fever, pain , or abdominal swelling? No. Pain Score  0 *  Have you tolerated food without any problems? Yes.    Have you been able to return to your normal activities? Yes.    Do you have any questions about your discharge instructions: Diet   No. Medications  No. Follow up visit  No.  Do you have questions or concerns about your Care? No.  Actions: * If pain score is 4 or above: No action needed, pain <4.

## 2018-04-26 DIAGNOSIS — R69 Illness, unspecified: Secondary | ICD-10-CM | POA: Diagnosis not present

## 2018-04-27 ENCOUNTER — Encounter: Payer: Self-pay | Admitting: Internal Medicine

## 2018-05-04 DIAGNOSIS — R69 Illness, unspecified: Secondary | ICD-10-CM | POA: Diagnosis not present

## 2018-05-12 DIAGNOSIS — M62830 Muscle spasm of back: Secondary | ICD-10-CM | POA: Diagnosis not present

## 2018-05-12 DIAGNOSIS — K5900 Constipation, unspecified: Secondary | ICD-10-CM | POA: Diagnosis not present

## 2018-05-12 DIAGNOSIS — M47816 Spondylosis without myelopathy or radiculopathy, lumbar region: Secondary | ICD-10-CM | POA: Diagnosis not present

## 2018-05-12 DIAGNOSIS — G894 Chronic pain syndrome: Secondary | ICD-10-CM | POA: Diagnosis not present

## 2018-05-17 ENCOUNTER — Other Ambulatory Visit: Payer: Self-pay

## 2018-05-17 MED ORDER — FLUTICASONE PROPIONATE 50 MCG/ACT NA SUSP: 1.0000 | Freq: Every day | NASAL | 3 refills | Status: DC | PRN

## 2018-05-21 DIAGNOSIS — R69 Illness, unspecified: Secondary | ICD-10-CM | POA: Diagnosis not present

## 2018-05-31 DIAGNOSIS — R69 Illness, unspecified: Secondary | ICD-10-CM | POA: Diagnosis not present

## 2018-06-01 DIAGNOSIS — R69 Illness, unspecified: Secondary | ICD-10-CM | POA: Diagnosis not present

## 2018-06-09 DIAGNOSIS — M62830 Muscle spasm of back: Secondary | ICD-10-CM | POA: Diagnosis not present

## 2018-06-09 DIAGNOSIS — G894 Chronic pain syndrome: Secondary | ICD-10-CM | POA: Diagnosis not present

## 2018-06-09 DIAGNOSIS — M47816 Spondylosis without myelopathy or radiculopathy, lumbar region: Secondary | ICD-10-CM | POA: Diagnosis not present

## 2018-06-09 DIAGNOSIS — K5900 Constipation, unspecified: Secondary | ICD-10-CM | POA: Diagnosis not present

## 2018-06-14 DIAGNOSIS — G894 Chronic pain syndrome: Secondary | ICD-10-CM | POA: Diagnosis not present

## 2018-06-14 DIAGNOSIS — M47816 Spondylosis without myelopathy or radiculopathy, lumbar region: Secondary | ICD-10-CM | POA: Diagnosis not present

## 2018-06-14 DIAGNOSIS — M62830 Muscle spasm of back: Secondary | ICD-10-CM | POA: Diagnosis not present

## 2018-06-14 DIAGNOSIS — K5900 Constipation, unspecified: Secondary | ICD-10-CM | POA: Diagnosis not present

## 2018-06-22 ENCOUNTER — Ambulatory Visit: Payer: Medicare HMO | Admitting: Family Medicine

## 2018-06-22 ENCOUNTER — Ambulatory Visit (INDEPENDENT_AMBULATORY_CARE_PROVIDER_SITE_OTHER): Payer: Medicare HMO

## 2018-06-22 VITALS — BP 163/84 | HR 86 | Wt 212.0 lb

## 2018-06-22 DIAGNOSIS — S199XXA Unspecified injury of neck, initial encounter: Secondary | ICD-10-CM | POA: Diagnosis not present

## 2018-06-22 DIAGNOSIS — B351 Tinea unguium: Secondary | ICD-10-CM | POA: Diagnosis not present

## 2018-06-22 DIAGNOSIS — F3177 Bipolar disorder, in partial remission, most recent episode mixed: Secondary | ICD-10-CM | POA: Diagnosis not present

## 2018-06-22 DIAGNOSIS — M542 Cervicalgia: Secondary | ICD-10-CM

## 2018-06-22 DIAGNOSIS — I10 Essential (primary) hypertension: Secondary | ICD-10-CM

## 2018-06-22 DIAGNOSIS — R69 Illness, unspecified: Secondary | ICD-10-CM | POA: Diagnosis not present

## 2018-06-22 MED ORDER — PREDNISONE 50 MG PO TABS: 50.0000 mg | ORAL_TABLET | Freq: Every day | ORAL | 0 refills | Status: DC

## 2018-06-22 NOTE — Progress Notes (Signed)
[name] is a 52 y.o. female who presents to [facility]: [place] today for follow-up toenail fungus, discuss mood, discuss hypertension and discuss neck pain.  [name] has onychomycosis and has been taking terbinafine.  She tolerates it well and notes that her toenail fungus is improving.  She notes that her mood is worsened recently.  Her father is dying from cancer and her aunt recently died after a fall.  She has increased stressors.  She does continue to receive regular counseling and care with a psychiatrist.  She denies any homicidal or suicidal ideation.  Additionally she notes that she strained her neck recently.  She helped pick her father up off the floor about 6 weeks ago and has had burning pain in her neck since.  Pain is located in the posterior lateral left side of her neck.  Pain radiates to the shoulder but not beyond the shoulder.  She does receive chronic pain management and notes that her neck pain is not well controlled.  She denies any weakness or numbness distally.  Additionally she notes her blood pressure is a bit elevated today.  She notes typically is well controlled at home.  No chest pain palpitations or shortness of breath.   ROS as above:  Exam:  BP (!) 163/84   Pulse 86   Wt 212 lb (96.2 kg)   LMP 08/22/2016 (Approximate)   BMI 33.20 kg/m  Wt Readings from Last 5 Encounters:  06/22/18 212 lb (96.2 kg)  04/21/18 211 lb (95.7 kg)  04/13/18 211 lb 1.6 oz (95.8 kg)  03/30/18 207 lb (93.9 kg)  03/19/18 211 lb (95.7 kg)    Gen: Well NAD HEENT: EOMI,  MMM Lungs: Normal work of breathing. CTABL Heart: RRR no MRG Abd: NABS, Soft. Nondistended, Nontender Exts: Brisk capillary refill, warm and well perfused.  C-spine: Nontender to spinal midline.  Tender palpation left cervical paraspinal musculature and trapezius. Decreased left cervical  motion. Intact strength throughout upper extremities. Alert and oriented normal speech and thought process.  Affect is tearful.  No SI or HI expressed. Left foot reveals thickened whitish great toenail at the lateral corner.  Improved appearance from last time.  Lab and Radiology Results No results found for this or any previous visit (from the past 72 hour(s)). Dg Cervical Spine Complete  Result Date: 06/22/2018 CLINICAL DATA:  Injury. EXAM: CERVICAL SPINE - COMPLETE 4+ VIEW COMPARISON:  MRI 04/11/2014. FINDINGS: No acute bony abnormality identified. No evidence of fracture or dislocation. Pulmonary apices are clear. IMPRESSION: No acute bony abnormality.  No evidence of fracture or dislocation. Electronically Signed   By: Marcello Moores  Register   On: 06/22/2018 12:28   I personally (independently) visualized and performed the interpretation of the images attached in this note.    Assessment and Plan: 52 y.o. female with  Worsening mood revolving around increased stress from her family illness.  Discussed options.  Recommend prompt follow-up with psychiatry and psychology.  Continue current regimen and recheck in about 2 weeks.  Pain: Cervical strain versus possible C4 nerve root radiculopathy.  X-ray today not dramatic appearing we will proceed with trial of short course of prednisone.  Patient is intolerant to gabapentin and to Lyrica.  Consider further management with chronic pain management.  Hypertension: Blood pressure elevated today.  Watchful waiting recheck in about 2 weeks.  Toenail fungus: Improving.  Check metabolic panel and continue terbinafine if normal.  CC: [place] [facility]  Orders Placed This Encounter  Procedures  . DG Cervical Spine Complete    Standing Status:   Future    Number of Occurrences:   1    Standing Expiration Date:   08/24/2019    Order Specific Question:   Reason for Exam (SYMPTOM  OR DIAGNOSIS REQUIRED)    Answer:   eval possible left C4  radicuopathy. No injury    Order Specific Question:   Is patient pregnant?    Answer:   No    Order Specific Question:   Preferred imaging location?    Answer:   [facility]    Order Specific Question:   Radiology Contrast Protocol - do NOT remove file path    Answer:   [email or web address]  . COMPLETE METABOLIC PANEL WITH GFR   Meds ordered this encounter  Medications  . predniSONE (DELTASONE) 50 MG tablet    Sig: Take 1 tablet (50 mg total) by mouth daily.    Dispense:  5 tablet    Refill:  0     Historical information moved to improve visibility of documentation.  Past Medical History:  Diagnosis Date  . Allergy   . Anxiety   . Arthritis   . Asthma    History of Asthma  . Bipolar disorder ([place])   . Chest pain, atypical 11/30/2012  . Clotting disorder ([place])   . Depression   . Diabetes mellitus without complication ([place])    type 2, pre-diabetic before she lost weight  . GERD (gastroesophageal reflux disease)   . H/O degenerative disc disease    L4-L5, L5-S1  . Heart murmur   . Hyperglycemia    Postoperative hyperglycemia  . Hypertension    hx of  . 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 Obesity 07/14/2012  . OSA (obstructive sleep apnea)    mild  . Pneumonia   . Polycystic disease, ovaries   . Postoperative anemia    2018 - while dieting  hx of  . Reflux   . Sinus tachycardia 07/14/2012   Pt says HR > 100 is consistent  . Sleep apnea    has had two tests, different results- no CPAP used   Past Surgical History:  Procedure Laterality Date  . COLONOSCOPY    . FOOT SURGERY     Right plantar facsiitis scrape  . KNEE SURGERY Bilateral 2007  . LAPAROSCOPIC GASTRIC SLEEVE RESECTION N/A 03/30/2017   Procedure: LAPAROSCOPIC GASTRIC SLEEVE RESECTION, UPPER ENDO;  Surgeon: Greer Pickerel, MD;  Location: [facility];  Service: General;  Laterality: N/A;  . NASAL SINUS SURGERY  2004    . OVARIAN CYST REMOVAL  1998   A cyst on the fallopian tube removed  . SPINAL FUSION    . TONSILLECTOMY  1996   Social History   Tobacco Use  . Smoking status: Never Smoker  . Smokeless tobacco: Never Used  Substance Use Topics  . Alcohol use: No   family history includes Cancer in her father, maternal grandmother, paternal aunt, paternal aunt, paternal grandmother, and paternal uncle; Colon cancer in her maternal grandmother; Diabetes in her father, mother, and paternal aunt.  Medications: Current Outpatient Medications  Medication Sig Dispense Refill  . albuterol (PROAIR HFA) 108 (90 BASE) MCG/ACT inhaler Inhale 2 puffs into the lungs every 4 (four) hours as needed for wheezing or shortness of breath.    . ALPRAZolam (XANAX) 1 MG tablet  Take 1 mg by mouth 3 (three) times daily as needed (for anxiety).    . budesonide-formoterol (SYMBICORT) 160-4.5 MCG/ACT inhaler Inhale 2 puffs into the lungs 2 (two) times daily. 3 Inhaler 4  . buPROPion (WELLBUTRIN XL) 150 MG 24 hr tablet Take 150 mg by mouth daily with breakfast. Total daily dose=450mg     . buPROPion (WELLBUTRIN XL) 300 MG 24 hr tablet Take 300 mg by mouth daily with breakfast. Total daily dose=450mg     . Calcium Citrate-Vitamin D (CELEBRATE CALCIUM PLUS 500) 500-333 MG-UNIT CHEW Chew 1 tablet by mouth 3 (three) times daily.    . carbamazepine (CARBATROL) 100 MG 12 hr capsule Take 100 mg by mouth 2 (two) times daily.    Marland Kitchen docusate sodium (COLACE) 100 MG capsule Take 100 mg by mouth 2 (two) times daily.    Marland Kitchen escitalopram (LEXAPRO) 20 MG tablet Take 20 mg by mouth daily with breakfast.     . estradiol (ESTRACE) 1 MG tablet Take 1 tablet (1 mg total) by mouth daily. 90 tablet 3  . fentaNYL (DURAGESIC - DOSED MCG/HR) 50 MCG/HR Place 50 mcg onto the skin every 3 (three) days.     . fluticasone (FLONASE) 50 MCG/ACT nasal spray Place 1 spray into both nostrils daily as needed (for allergies.). 16 g 3  . HYDROmorphone (DILAUDID) 2 MG  tablet Take 2 mg by mouth 4 (four) times daily.     . medroxyPROGESTERone (PROVERA) 2.5 MG tablet Take 1 tablet (2.5 mg total) by mouth daily. 90 tablet 3  . MOVANTIK 25 MG TABS tablet Take 25 mg by mouth at bedtime.     . Multiple Vitamins-Minerals (CELEBRATE MULTI-COMPLETE 36) CHEW Chew 1 tablet by mouth 2 (two) times daily.    . pantoprazole (PROTONIX) 40 MG tablet Take 1 tablet (40 mg total) by mouth 2 (two) times daily. 180 tablet 3  . Sennosides (SENOKOT PO) Take by mouth. 2 tablets at night    . terbinafine (LAMISIL) 250 MG tablet Take 1 tablet (250 mg total) by mouth daily. 90 tablet 0  . valACYclovir (VALTREX) 1000 MG tablet 1 tablet daily 31 tablet 12  . predniSONE (DELTASONE) 50 MG tablet Take 1 tablet (50 mg total) by mouth daily. 5 tablet 0   No current facility-administered medications for this visit.    Allergies  Allergen Reactions  . Gabapentin Nausea And Vomiting and Anaphylaxis  . Pineapple Shortness Of Breath and Swelling    Throat swells  . Shellfish-Derived Products Anaphylaxis  . Erythromycin Swelling  . Latex Rash    Hives  . Pregabalin Swelling  . Duloxetine Other (See Comments)    Confusion/dizziness  . Other     Makes throat close  . Pollen Extract Other (See Comments)    Stuffy  Nose     Discussed warning signs or symptoms. Please see discharge instructions. Patient expresses understanding.

## 2018-06-22 NOTE — Patient Instructions (Signed)
Thank you for coming in today. Make sure to follow up with your psychiatry doctor and therapist.  Get labs and xray now.  Take short course of prednisone for neck pain.  Recheck with me in 2 weeks.

## 2018-06-23 ENCOUNTER — Encounter: Payer: Self-pay | Admitting: Family Medicine

## 2018-06-23 LAB — COMPLETE METABOLIC PANEL WITH GFR
AG RATIO: 1.5 (calc) (ref 1.0–2.5)
ALKALINE PHOSPHATASE (APISO): 129 U/L (ref 33–130)
ALT: 50 U/L — ABNORMAL HIGH (ref 6–29)
AST: 30 U/L (ref 10–35)
Albumin: 4.4 g/dL (ref 3.6–5.1)
BUN: 14 mg/dL (ref 7–25)
CHLORIDE: 102 mmol/L (ref 98–110)
CO2: 27 mmol/L (ref 20–32)
Calcium: 10 mg/dL (ref 8.6–10.4)
Creat: 0.72 mg/dL (ref 0.50–1.05)
GFR, EST NON AFRICAN AMERICAN: 97 mL/min/{1.73_m2} (ref >=60)
GFR, Est African American: 112 mL/min/{1.73_m2} (ref >=60)
GLOBULIN: 3 g/dL (ref 1.9–3.7)
Glucose, Bld: 100 mg/dL — ABNORMAL HIGH (ref 65–99)
POTASSIUM: 4.2 mmol/L (ref 3.5–5.3)
SODIUM: 140 mmol/L (ref 135–146)
Total Bilirubin: 0.5 mg/dL (ref 0.2–1.2)
Total Protein: 7.4 g/dL (ref 6.1–8.1)

## 2018-06-23 MED ORDER — TERBINAFINE HCL 250 MG PO TABS: 250.0000 mg | ORAL_TABLET | Freq: Every day | ORAL | 0 refills | Status: DC

## 2018-06-23 NOTE — Addendum Note (Signed)
Addended by: Gregor Hams on: 06/23/2018 06:51 AM   Modules accepted: Orders

## 2018-07-09 ENCOUNTER — Ambulatory Visit: Payer: Medicare HMO | Admitting: Family Medicine

## 2018-07-09 VITALS — BP 139/75 | HR 82 | Ht 67.0 in | Wt 216.0 lb

## 2018-07-09 DIAGNOSIS — F3177 Bipolar disorder, in partial remission, most recent episode mixed: Secondary | ICD-10-CM | POA: Diagnosis not present

## 2018-07-09 DIAGNOSIS — R69 Illness, unspecified: Secondary | ICD-10-CM | POA: Diagnosis not present

## 2018-07-09 DIAGNOSIS — M542 Cervicalgia: Secondary | ICD-10-CM | POA: Diagnosis not present

## 2018-07-09 MED ORDER — ESCITALOPRAM OXALATE 20 MG PO TABS: 20.0000 mg | ORAL_TABLET | Freq: Every day | ORAL | 3 refills | Status: AC

## 2018-07-09 MED ORDER — QUETIAPINE FUMARATE 50 MG PO TABS: 25.0000 mg | ORAL_TABLET | Freq: Every evening | ORAL | 1 refills | Status: DC | PRN

## 2018-07-09 NOTE — Progress Notes (Signed)
[name] is a 53 y.o. female who presents to [facility]: [place] today for neck pain and mood.  Neck pain: [name] was seen about 2 weeks ago for neck pain thought to be possibly C4 cervical radiculopathy versus cervical myofascial strain.  She was given a short course of prednisone which did not help.  She is allergic or intolerant to gabapentin and Lyrica.  X-ray about 2 weeks ago of her C-spine was not particularly remarkable.  She notes continued pain that is quite bothersome.  She is interested in physical therapy if offered.  She denies significant radiating pain weakness or numbness distal bilateral upper extremities.  Additionally she noted the last visit significant difficulty with her mood.  She already has an existing relationship with a psychiatrist and is in the process of transitioning several of her medications.  She was tried on carbamazepine as a mood stabilizer but notes that she developed a rash and swelling and stopped it per recommendations.  She has a follow-up appointment with psychiatry scheduled for January 27.  She notes that she is having significant difficulty sleeping.  She is sleeping about 3 hours a night and feels fatigued.  She notes increased life stress.  Her father is significantly ill.   ROS as above:  Exam:  BP 139/75   Pulse 82   Ht 5\' 7"  (1.702 m)   Wt 216 lb (98 kg)   LMP 08/22/2016 (Approximate)   BMI 33.83 kg/m  Wt Readings from Last 5 Encounters:  07/09/18 216 lb (98 kg)  06/22/18 212 lb (96.2 kg)  04/21/18 211 lb (95.7 kg)  04/13/18 211 lb 1.6 oz (95.8 kg)  03/30/18 207 lb (93.9 kg)    Gen: Well NAD HEENT: EOMI,  MMM Lungs: Normal work of breathing. CTABL Heart: RRR no MRG Abd: NABS, Soft. Nondistended, Nontender Exts: Brisk capillary refill, warm and well perfused.  C-spine nontender to spinal midline.  Tender palpation  left cervical paraspinal musculature.  Upper extremity strength and motion are intact. Psych alert and oriented normal speech thought process and affect.  SI or HI expressed.  Depression screen [facility] 2/9 07/09/2018 12/22/2017 05/26/2017 02/17/2017  Decreased Interest 3 2 0 0  Down, Depressed, Hopeless 1 2 0 0  PHQ - 2 Score 4 4 0 0  Altered sleeping 3 3 - -  Tired, decreased energy 0 3 - -  Change in appetite 3 2 - -  Feeling bad or failure about yourself  3 2 - -  Trouble concentrating 3 3 - -  Moving slowly or fidgety/restless 3 1 - -  Suicidal thoughts 0 0 - -  PHQ-9 Score 19 18 - -  Difficult doing work/chores Very difficult Very difficult - -     GAD 7 : Generalized Anxiety Score 07/09/2018 12/22/2017  Nervous, Anxious, on Edge 3 2  Control/stop worrying 3 3  Worry too much - different things 3 3  Trouble relaxing 3 3  Restless 3 0  Easily annoyed or irritable 3 3  Afraid - awful might happen 3 3  Total GAD 7 Score 21 17  Anxiety Difficulty Extremely difficult Somewhat difficult     Lab and Radiology Results No results found for this or any previous visit (from the past 72 hour(s)). No results found.    Assessment and Plan: 53 y.o. female with  Neck pain possible left C4 radiculopathy.  Plan to refer to physical therapy.  Check back in 1 month.  If not improving neck step would be MRI for injection planning.  Continue comanagement with pain management as well.   Mood: Not well controlled either.  Increased life stress.  Plan to proceed with follow-up with psychiatry.  However in the interim significant difficulty with insomnia.  Plan to prescribe titrating course of Seroquel at bedtime as needed.  This should help with both potential depressive and anxiety symptoms as well as insomnia.  This may not be a good long-term medication but I think it would be helpful for right now.  Follow-up with psychiatry as well.  CC:Dr  [facility] [address]  [place], [place] [address] [phone]  PDMP reviewed during this encounter. Orders Placed This Encounter  Procedures  . Ambulatory referral to Physical Therapy    Referral Priority:   Routine    Referral Type:   Physical Medicine    Referral Reason:   Specialty Services Required    Requested Specialty:   Physical Therapy   Meds ordered this encounter  Medications  . escitalopram (LEXAPRO) 20 MG tablet    Sig: Take 1 tablet (20 mg total) by mouth daily with breakfast.    Dispense:  30 tablet    Refill:  3  . QUEtiapine (SEROQUEL) 50 MG tablet    Sig: Take 0.5-2 tablets (25-100 mg total) by mouth at bedtime as needed (sleep).    Dispense:  60 tablet    Refill:  1     Historical information moved to improve visibility of documentation.  Past Medical History:  Diagnosis Date  . Allergy   . Anxiety   . Arthritis   . Asthma    History of Asthma  . Bipolar disorder ([place])   . Chest pain, atypical 11/30/2012  . Clotting disorder ([place])   . Depression   . Diabetes mellitus without complication ([place])    type 2, pre-diabetic before she lost weight  . GERD (gastroesophageal reflux disease)   . H/O degenerative disc disease    L4-L5, L5-S1  . Heart murmur   . Hyperglycemia    Postoperative hyperglycemia  . Hypertension    hx of  . 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 Obesity 07/14/2012  . OSA (obstructive sleep apnea)    mild  . Pneumonia   . Polycystic disease, ovaries   . Postoperative anemia    2018 - while dieting  hx of  . Reflux   . Sinus tachycardia 07/14/2012   Pt says HR > 100 is consistent  . Sleep apnea    has had two tests, different results- no CPAP used   Past Surgical History:  Procedure Laterality Date  . COLONOSCOPY    . FOOT SURGERY     Right plantar facsiitis scrape  . KNEE SURGERY Bilateral 2007  . LAPAROSCOPIC GASTRIC SLEEVE RESECTION N/A 03/30/2017   Procedure:  LAPAROSCOPIC GASTRIC SLEEVE RESECTION, UPPER ENDO;  Surgeon: Greer Pickerel, MD;  Location: [facility];  Service: General;  Laterality: N/A;  . NASAL SINUS SURGERY  2004  . OVARIAN CYST REMOVAL  1998   A cyst on the fallopian tube removed  . SPINAL FUSION    . TONSILLECTOMY  1996   Social History   Tobacco Use  . Smoking status: Never Smoker  . Smokeless tobacco: Never Used  Substance Use Topics  . Alcohol use: No   family history  includes Cancer in her father, maternal grandmother, paternal aunt, paternal aunt, paternal grandmother, and paternal uncle; Colon cancer in her maternal grandmother; Diabetes in her father, mother, and paternal aunt.  Medications: Current Outpatient Medications  Medication Sig Dispense Refill  . albuterol (PROAIR HFA) 108 (90 BASE) MCG/ACT inhaler Inhale 2 puffs into the lungs every 4 (four) hours as needed for wheezing or shortness of breath.    . budesonide-formoterol (SYMBICORT) 160-4.5 MCG/ACT inhaler Inhale 2 puffs into the lungs 2 (two) times daily. 3 Inhaler 4  . buPROPion (WELLBUTRIN XL) 150 MG 24 hr tablet Take 150 mg by mouth daily with breakfast. Total daily dose=450mg     . buPROPion (WELLBUTRIN XL) 300 MG 24 hr tablet Take 300 mg by mouth daily with breakfast. Total daily dose=450mg     . Calcium Citrate-Vitamin D (CELEBRATE CALCIUM PLUS 500) 500-333 MG-UNIT CHEW Chew 1 tablet by mouth 3 (three) times daily.    Marland Kitchen docusate sodium (COLACE) 100 MG capsule Take 100 mg by mouth 2 (two) times daily.    Marland Kitchen escitalopram (LEXAPRO) 20 MG tablet Take 1 tablet (20 mg total) by mouth daily with breakfast. 30 tablet 3  . estradiol (ESTRACE) 1 MG tablet Take 1 tablet (1 mg total) by mouth daily. 90 tablet 3  . fentaNYL (DURAGESIC - DOSED MCG/HR) 50 MCG/HR Place 50 mcg onto the skin every 3 (three) days.     . fluticasone (FLONASE) 50 MCG/ACT nasal spray Place 1 spray into both nostrils daily as needed (for allergies.). 16 g 3  . HYDROmorphone (DILAUDID) 2 MG tablet Take  2 mg by mouth 4 (four) times daily.     . medroxyPROGESTERone (PROVERA) 2.5 MG tablet Take 1 tablet (2.5 mg total) by mouth daily. 90 tablet 3  . MOVANTIK 25 MG TABS tablet Take 25 mg by mouth at bedtime.     . Multiple Vitamins-Minerals (CELEBRATE MULTI-COMPLETE 36) CHEW Chew 1 tablet by mouth 2 (two) times daily.    . pantoprazole (PROTONIX) 40 MG tablet Take 1 tablet (40 mg total) by mouth 2 (two) times daily. 180 tablet 3  . predniSONE (DELTASONE) 50 MG tablet Take 1 tablet (50 mg total) by mouth daily. 5 tablet 0  . Sennosides (SENOKOT PO) Take by mouth. 2 tablets at night    . temazepam (RESTORIL) 15 MG capsule Take 30 mg by mouth at bedtime as needed.    . terbinafine (LAMISIL) 250 MG tablet Take 1 tablet (250 mg total) by mouth daily. 90 tablet 0  . valACYclovir (VALTREX) 1000 MG tablet 1 tablet daily 31 tablet 12  . QUEtiapine (SEROQUEL) 50 MG tablet Take 0.5-2 tablets (25-100 mg total) by mouth at bedtime as needed (sleep). 60 tablet 1   No current facility-administered medications for this visit.    Allergies  Allergen Reactions  . Carbamazepine Swelling    Rash and tongue swelling  . Gabapentin Nausea And Vomiting and Anaphylaxis  . Pineapple Shortness Of Breath and Swelling    Throat swells  . Shellfish-Derived Products Anaphylaxis  . Erythromycin Swelling  . Latex Rash    Hives  . Pregabalin Swelling  . Duloxetine Other (See Comments)    Confusion/dizziness  . Other     Makes throat close  . Pollen Extract Other (See Comments)    Stuffy  Nose     Discussed warning signs or symptoms. Please see discharge instructions. Patient expresses understanding.

## 2018-07-09 NOTE — Patient Instructions (Addendum)
Thank you for coming in today. Attend physical therapy.  If not better MRI at end of January.   For mood and insomnia start Seroquel 25mg  at bedtime as needed for insomnia.  Ok to increase up to 100mg  if needed.  Follow up with psychiatry.   Recheck with me in about 1 month.  Return sooner if needed.   Quetiapine tablets What is this medicine? QUETIAPINE (kwe TYE a peen) is an antipsychotic. It is used to treat schizophrenia and bipolar disorder, also known as manic-depression. This medicine may be used for other purposes; ask your health care provider or pharmacist if you have questions. COMMON BRAND NAME(S): Seroquel What should I tell my health care provider before I take this medicine? They need to know if you have any of these conditions: -blockage in your bowel -cataracts -constipation -dehydration -diabetes -difficulty swallowing -glaucoma -heart disease -history of breast cancer -kidney disease -liver disease -low blood counts, like low white cell, platelet, or red cell counts -low blood pressure or dizziness when standing up -Parkinson's disease -previous heart attack -prostate disease -seizures -stomach or intestine problems -suicidal thoughts, plans or attempt; a previous suicide attempt by you or a family member -thyroid disease -trouble passing urine -an unusual or allergic reaction to quetiapine, other medicines, foods, dyes, or preservatives -pregnant or trying to get pregnant -breast-feeding How should I use this medicine? Take this medicine by mouth. Swallow it with a drink of water. Follow the directions on the prescription label. If it upsets your stomach you can take it with food. Take your medicine at regular intervals. Do not take it more often than directed. Do not stop taking except on the advice of your doctor or health care professional. A special MedGuide will be given to you by the pharmacist with each prescription and refill. Be sure to read  this information carefully each time. Talk to your pediatrician regarding the use of this medicine in children. While this drug may be prescribed for children as young as 10 years for selected conditions, precautions do apply. Patients over age 36 years may have a stronger reaction to this medicine and need smaller doses. Overdosage: If you think you have taken too much of this medicine contact a poison control center or emergency room at once. NOTE: This medicine is only for you. Do not share this medicine with others. What if I miss a dose? If you miss a dose, take it as soon as you can. If it is almost time for your next dose, take only that dose. Do not take double or extra doses. What may interact with this medicine? Do not take this medicine with any of the following medications: -cisapride -dofetilide -dronedarone -fluconazole -metoclopramide -pimozide -posaconazole -thioridazine This medicine may also interact with the following medications: -alcohol -antihistamines for allergy cough and cold -antiviral medicines for HIV or AIDS -atropine -certain medicines for bladder problems like oxybutynin, tolterodine -certain medicines for blood pressure -certain medicines for depression, anxiety, or psychotic disturbances -certain medicines for diabetes -certain medicines for stomach problems like dicyclomine, hyoscyamine -certain medicines for travel sickness like scopolamine -certain medicines for Parkinson's disease -certain medicines for seizures like carbamazepine, phenobarbital, phenytoin -cimetidine -erythromycin -ipratropium -other medicines that prolong the QT interval (cause an abnormal heart rhythm) -rifampin -steroid medicines like prednisone or cortisone This list may not describe all possible interactions. Give your health care provider a list of all the medicines, herbs, non-prescription drugs, or dietary supplements you use. Also tell them if you smoke, drink  alcohol, or use illegal drugs. Some items may interact with your medicine. What should I watch for while using this medicine? Visit your doctor or health care professional for regular checks on your progress. It may be several weeks before you see the full effects of this medicine. Your health care provider may suggest that you have your eyes examined prior to starting this medicine, and every 6 months thereafter. If you have been taking this medicine regularly for some time, do not suddenly stop taking it. You must gradually reduce the dose or your symptoms may get worse. Ask your doctor or health care professional for advice. Patients and their families should watch out for worsening depression or thoughts of suicide. Also watch out for sudden or severe changes in feelings such as feeling anxious, agitated, panicky, irritable, hostile, aggressive, impulsive, severely restless, overly excited and hyperactive, or not being able to sleep. If this happens, especially at the beginning of antidepressant treatment or after a change in dose, call your health care professional. Dennis Bast may get dizzy or drowsy. Do not drive, use machinery, or do anything that needs mental alertness until you know how this medicine affects you. Do not stand or sit up quickly, especially if you are an older patient. This reduces the risk of dizzy or fainting spells. Alcohol can increase dizziness and drowsiness. Avoid alcoholic drinks. Do not treat yourself for colds, diarrhea or allergies. Ask your doctor or health care professional for advice, some ingredients may increase possible side effects. This medicine can reduce the response of your body to heat or cold. Dress warm in cold weather and stay hydrated in hot weather. If possible, avoid extreme temperatures like saunas, hot tubs, very hot or cold showers, or activities that can cause dehydration such as vigorous exercise. What side effects may I notice from receiving this  medicine? Side effects that you should report to your doctor or health care professional as soon as possible: -allergic reactions like skin rash, itching or hives, swelling of the face, lips, or tongue -changes in vision -difficulty swallowing -elevated mood, decreased need for sleep, racing thoughts, impulsive behavior -eye pain -redness, blistering, peeling, or loosening of the skin, including inside the mouth -restlessness, pacing, inability to keep still -seizures -signs and symptoms of a dangerous change in heartbeat or heart rhythm like chest pain; dizziness; fast, irregular heartbeat; palpitations; feeling faint or lightheaded; falls; breathing problems -signs and symptoms of high blood sugar such as dizziness; dry mouth; dry skin; fruity breath; nausea; stomach pain; increased hunger; increased thirst; increased urination -signs and symptoms of hypothyroidism like fatigue; increased sensitivity to cold; weight gain; hoarseness; thinning hair -signs and symptoms of infection like fever; chills; cough; sore throat; pain or trouble passing urine -signs and symptoms of low blood pressure like dizziness; feeling faint or lightheaded; falls; unusually weak or tired -signs and symptoms of neuroleptic malignant syndrome (NMS) like confusion; fast, irregular heartbeat; high fever; increased sweating; stiff muscles -signs and symptoms of a stroke like changes in vision; confusion; trouble speaking or understanding; severe headaches; sudden numbness or weakness of the face, arm or leg; trouble walking; dizziness; loss of balance or coordination -signs and symptoms of tardive dyskinesia, like uncontrollable head, mouth, neck, arm, or leg movements -suicidal thoughts, mood changes Side effects that usually do not require medical attention (report to your doctor or health care professional if they continue or are bothersome): -change in sex drive or performance -constipation -drowsiness -dry  mouth -upset stomach -weight gain This list  may not describe all possible side effects. Call your doctor for medical advice about side effects. You may report side effects to FDA at 1-800-FDA-1088. Where should I keep my medicine? Keep out of the reach of children. Store at room temperature between 15 and 30 degrees C (59 and 86 degrees F). Throw away any unused medicine after the expiration date. NOTE: This sheet is a summary. It may not cover all possible information. If you have questions about this medicine, talk to your doctor, pharmacist, or health care provider.  2019 Elsevier/Gold Standard (2017-06-12 14:16:00)

## 2018-07-12 DIAGNOSIS — M47816 Spondylosis without myelopathy or radiculopathy, lumbar region: Secondary | ICD-10-CM | POA: Diagnosis not present

## 2018-07-12 DIAGNOSIS — R69 Illness, unspecified: Secondary | ICD-10-CM | POA: Diagnosis not present

## 2018-07-12 DIAGNOSIS — K5900 Constipation, unspecified: Secondary | ICD-10-CM | POA: Diagnosis not present

## 2018-07-12 DIAGNOSIS — M62830 Muscle spasm of back: Secondary | ICD-10-CM | POA: Diagnosis not present

## 2018-07-12 DIAGNOSIS — G894 Chronic pain syndrome: Secondary | ICD-10-CM | POA: Diagnosis not present

## 2018-07-14 ENCOUNTER — Encounter: Payer: Self-pay | Admitting: Rehabilitative and Restorative Service Providers"

## 2018-07-14 ENCOUNTER — Ambulatory Visit (INDEPENDENT_AMBULATORY_CARE_PROVIDER_SITE_OTHER): Payer: Medicare HMO | Admitting: Rehabilitative and Restorative Service Providers"

## 2018-07-14 ENCOUNTER — Other Ambulatory Visit: Payer: Self-pay

## 2018-07-14 DIAGNOSIS — R29898 Other symptoms and signs involving the musculoskeletal system: Secondary | ICD-10-CM

## 2018-07-14 DIAGNOSIS — Z79899 Other long term (current) drug therapy: Secondary | ICD-10-CM | POA: Diagnosis not present

## 2018-07-14 DIAGNOSIS — M542 Cervicalgia: Secondary | ICD-10-CM | POA: Diagnosis not present

## 2018-07-14 DIAGNOSIS — R293 Abnormal posture: Secondary | ICD-10-CM

## 2018-07-14 DIAGNOSIS — G44201 Tension-type headache, unspecified, intractable: Secondary | ICD-10-CM

## 2018-07-14 NOTE — Therapy (Addendum)
[facility] [place], [place], [address] Phone: [phone]   Fax:  [phone]  Physical Therapy Evaluation  Patient Details  Name: [name] MRN: [id number] Date of Birth: 12-Mar-1966 Referring Provider (PT): Dr [name]    Encounter Date: 07/14/2018  PT End of Session - 07/14/18 0855    Visit Number  1    Number of Visits  12    Date for PT Re-Evaluation  08/25/18    PT Start Time  0744    PT Stop Time  0850    PT Time Calculation (min)  66 min    Activity Tolerance  Patient tolerated treatment well       Past Medical History:  Diagnosis Date  . Allergy   . Anxiety   . Arthritis   . Asthma    History of Asthma  . Bipolar disorder ([place])   . Chest pain, atypical 11/30/2012  . Clotting disorder ([place])   . Depression   . Diabetes mellitus without complication ([place])    type 2, pre-diabetic before she lost weight  . GERD (gastroesophageal reflux disease)   . H/O degenerative disc disease    L4-L5, L5-S1  . Heart murmur   . Hyperglycemia    Postoperative hyperglycemia  . Hypertension    hx of  . 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 Obesity 07/14/2012  . OSA (obstructive sleep apnea)    mild  . Pneumonia   . Polycystic disease, ovaries   . Postoperative anemia    2018 - while dieting  hx of  . Reflux   . Sinus tachycardia 07/14/2012   Pt says HR > 100 is consistent  . Sleep apnea    has had two tests, different results- no CPAP used    Past Surgical History:  Procedure Laterality Date  . COLONOSCOPY    . FOOT SURGERY     Right plantar facsiitis scrape  . KNEE SURGERY Bilateral 2007  . LAPAROSCOPIC GASTRIC SLEEVE RESECTION N/A 03/30/2017   Procedure: LAPAROSCOPIC GASTRIC SLEEVE RESECTION, UPPER ENDO;  Surgeon: Greer Pickerel, MD;  Location: [facility];  Service: General;  Laterality: N/A;  . NASAL SINUS SURGERY  2004  . OVARIAN CYST REMOVAL   1998   A cyst on the fallopian tube removed  . SPINAL FUSION    . TONSILLECTOMY  1996    There were no vitals filed for this visit.   Subjective Assessment - 07/14/18 0755    Subjective  Patient reports that she had onset of pain ~ 05/07/18 when she was trying to lift her dad from the floor where he had fallen. She has had pain in the neck as well as tingling and burning in the back of neck to base of the spine and into the Lt shoulder area. She has daily headaches. She has tried prednisone with no change in symptoms.     Pertinent History  Anxiety; depression; LBP; knee pain; Lt frozen shoulder; arthritis; headaches     Diagnostic tests  xrays    Patient Stated Goals  get rid of the neck pain and headaches - sleep better     Currently in Pain?  Yes    Pain Score  7     Pain Location  Neck    Pain Orientation  Upper;Mid;Lower;Left;Right    Pain Descriptors / Indicators  Aching;Burning;Tingling    Pain Type  Acute pain  Pain Radiating Towards  into the Lt shoulder     Pain Onset  More than a month ago    Pain Frequency  Constant    Aggravating Factors   getting up; moving     Pain Relieving Factors  heat          [facility] PT Assessment - 07/14/18 0001      Assessment   Medical Diagnosis  Cervical dysfunction     Referring Provider (PT)  Dr [name]     Onset Date/Surgical Date  05/07/18    Hand Dominance  Right    Next MD Visit  08/12/2018    Prior Therapy  yes here for LBP; knee pain; Lt shoulder pain       Precautions   Precautions  None      Balance Screen   Has the patient fallen in the past 6 months  No    Has the patient had a decrease in activity level because of a fear of falling?   No    Is the patient reluctant to leave their home because of a fear of falling?   No      Home Environment   Living Environment  Private residence    Living Arrangements  Spouse/significant other    Type of [place] to enter    Entrance Stairs-Number of  Steps  3    Entrance Stairs-Rails  Can reach both      Prior Function   Level of Independence  Independent    Vocation  On disability   since 2005 for LBP    Leisure  household chores are limited; walking dog; tv; sedentary lifestyle       Observation/Other Assessments   Focus on Therapeutic Outcomes (FOTO)   51% limitation       Sensation   Additional Comments  tingling in the neck to base of skull on an intermittent basis       Posture/Postural Control   Posture Comments  head forward; shoudlers rounded and elevated; increased thoracic kyphosis      AROM   Right Shoulder Flexion  157 Degrees    Right Shoulder ABduction  167 Degrees    Right Shoulder External Rotation  90 Degrees    Left Shoulder Flexion  136 Degrees    Left Shoulder ABduction  140 Degrees    Left Shoulder External Rotation  91 Degrees    Cervical Flexion  62    Cervical Extension  36    Cervical - Right Side Bend  28    Cervical - Left Side Bend  14    Cervical - Right Rotation  41    Cervical - Left Rotation  39      Strength   Overall Strength Comments  WFL's bilat UE's       Palpation   Spinal mobility  hypomobility thoracic and cervical spine with CPA mobs     Palpation comment  muscular tightness bilat ant/lat/posterior cervical muaculature; pecs; upper traps; leveator; teres                 Objective measurements completed on examination: See above findings.      [facility] Adult PT Treatment/Exercise - 07/14/18 0001      Neuro Re-ed    Neuro Re-ed Details   working on posture and alignment       Shoulder Exercises: Standing   Other Standing Exercises  scap squeeze  10 sec x 5; L's x 10; W's x 10 with swim noodle       Shoulder Exercises: Stretch   Other Shoulder Stretches  3 way doorway stretch 30 sec x 1 rep       Moist Heat Therapy   Number Minutes Moist Heat  20 Minutes    Moist Heat Location  Cervical;Shoulder   Lt and thoracic spine      Electrical Stimulation    Electrical Stimulation Location  bilat cervical Lt shoulder area     Electrical Stimulation Action  IFC    Electrical Stimulation Parameters  to tolerance    Electrical Stimulation Goals  Pain;Tone      Neck Exercises: Stretches   Other Neck Stretches  axial extension 10 sec x 5 reps standing with noodle              PT Education - 07/14/18 0834    Education Details  HEP; Access Code: [email or web address]           PT Long Term Goals - 07/14/18 0846      PT LONG TERM GOAL #1   Title  Improve posture and alignment with patient to demonstrate more upright posture and alignment 08/25/2018    Time  6    Period  Weeks    Status  New      PT LONG TERM GOAL #2   Title  Increased AROM Lt shoudler to equal or greater than Rt 08/25/2018    Time  6    Period  Weeks    Status  New      PT LONG TERM GOAL #3   Title  Decrease cervical pain and headaches to 3/10 to 5/10 at most for functional activities and ADL's 08/25/2018    Time  6    Period  Weeks    Status  New      PT LONG TERM GOAL #4   Title  Independent in HEP 08/25/2018    Time  6    Period  Weeks    Status  New      PT LONG TERM GOAL #5   Title  Improve FOTO to </= 38% limitaion 08/25/2018    Time  6    Period  Weeks    Status  New             Plan - 07/14/18 0834    Clinical Impression Statement  Patient presents with ~ 2 month history of cervical pain and dysfunction as well as headaches following attempt to litf her dad from the floor after a fall. She has poor posture and alignment; limited cervical mobility and ROm; significant muscular tightness through cervical and shoulder girdle musculature; pain and headaches on a daily basis. Patient will benefit from PT to address the problems identified.     History and Personal Factors relevant to plan of care:  multiple orthopedic problems; arthritis; poor posture; sedentary lifestyle     Clinical Presentation  Stable    Clinical Decision Making  Low    Rehab Potential   Good    Clinical Impairments Affecting Rehab Potential  chronic pain     PT Frequency  2x / week    PT Duration  6 weeks    PT Treatment/Interventions  Patient/family education;ADLs/Self Care Home Management;Cryotherapy;Electrical Stimulation;Iontophoresis 4mg /ml Dexamethasone;Moist Heat;Ultrasound;Dry needling;Manual techniques;Neuromuscular re-education;Functional mobility training;Therapeutic activities;Therapeutic exercise    PT Next Visit Plan  HEP     PT Home Exercise Plan  Access  Code: [email or web address]        Patient will benefit from skilled therapeutic intervention in order to improve the following deficits and impairments:  Postural dysfunction, Improper body mechanics, Pain, Increased fascial restricitons, Increased muscle spasms, Hypomobility, Decreased mobility, Decreased range of motion, Decreased activity tolerance  Visit Diagnosis: Cervicalgia - Plan: PT plan of care cert/re-cert  Acute intractable tension-type headache - Plan: PT plan of care cert/re-cert  Other symptoms and signs involving the musculoskeletal system - Plan: PT plan of care cert/re-cert  Abnormal posture - Plan: PT plan of care cert/re-cert     Problem List Patient Active Problem List   Diagnosis Date Noted  . Positive colorectal cancer screening using Cologuard test 03/02/2018  . Leukocytosis 01/26/2018  . Onychomycosis of great toe 12/22/2017  . Family history of DVT 03/20/2017  . Arthritis of right knee 05/13/2016  . Dyslipidemia 03/04/2016  . Right knee pain 03/03/2016  . Pre-diabetes 11/01/2015  . Allergic rhinitis 05/28/2015  . BMI 33.0-33.9,adult 05/24/2013  . Polycystic ovary disease 07/14/2012  . Obstructive sleep apnea (adult) (pediatric) 02/04/2012  . Perimenopausal 11/10/2011  . Disturbance in sleep behavior 09/16/2011  . Asthma 05/23/2011  . Backache 05/23/2011  . Bipolar disorder in partial remission ([place]) 05/23/2011  . Dysphagia 05/23/2011  . Esophageal reflux 05/23/2011     [name] PT, MPH  07/14/2018, 8:57 AM  [facility] [place], [place], [address] Phone: [phone]   Fax:  [phone]  Name: [name] MRN: [id number] Date of Birth: 1965-10-26

## 2018-07-14 NOTE — Patient Instructions (Signed)
Access Code: [email or web address]  URL: [email or web address]  Date: 07/14/2018  Prepared by: Gillermo Murdoch   Exercises  Seated Cervical Retraction - 10 reps - 1 sets - 3x daily - 7x weekly  Standing Scapular Retraction - 10 reps - 1 sets - 10 hold - 3x daily - 7x weekly  Shoulder External Rotation and Scapular Retraction - 10 reps - 1 sets - hold - 3x daily - 7x weekly  Standing Scapular Retraction in Abduction - 10 reps - 1 sets - 3x daily - 7x weekly  Doorway Pec Stretch at 60 Degrees Abduction - 3 reps - 1 sets - 3x daily - 7x weekly  Doorway Pec Stretch at 90 Degrees Abduction - 3 reps - 1 sets - 30 seconds hold - 3x daily - 7x weekly  Doorway Pec Stretch at 120 Degrees Abduction - 3 reps - 1 sets - 30 second hold hold - 3x daily - 7x weekly  Patient Education  Trigger Point Dry Needling

## 2018-07-20 ENCOUNTER — Encounter: Payer: Self-pay | Admitting: Rehabilitative and Restorative Service Providers"

## 2018-07-20 ENCOUNTER — Ambulatory Visit: Payer: Medicare HMO | Admitting: Rehabilitative and Restorative Service Providers"

## 2018-07-20 DIAGNOSIS — M542 Cervicalgia: Secondary | ICD-10-CM

## 2018-07-20 DIAGNOSIS — G44201 Tension-type headache, unspecified, intractable: Secondary | ICD-10-CM | POA: Diagnosis not present

## 2018-07-20 DIAGNOSIS — R29898 Other symptoms and signs involving the musculoskeletal system: Secondary | ICD-10-CM

## 2018-07-20 DIAGNOSIS — R293 Abnormal posture: Secondary | ICD-10-CM | POA: Diagnosis not present

## 2018-07-20 NOTE — Patient Instructions (Signed)
Access Code: [email or web address]  URL: [email or web address]  Date: 07/20/2018  Prepared by: Gillermo Murdoch   Exercises  Seated Cervical Retraction - 10 reps - 1 sets - 3x daily - 7x weekly  Standing Scapular Retraction - 10 reps - 1 sets - 10 hold - 3x daily - 7x weekly  Shoulder External Rotation and Scapular Retraction - 10 reps - 1 sets - hold - 3x daily - 7x weekly  Standing Scapular Retraction in Abduction - 10 reps - 1 sets - 3x daily - 7x weekly  Doorway Pec Stretch at 60 Degrees Abduction - 3 reps - 1 sets - 3x daily - 7x weekly  Doorway Pec Stretch at 90 Degrees Abduction - 3 reps - 1 sets - 30 seconds hold - 3x daily - 7x weekly  Doorway Pec Stretch at 120 Degrees Abduction - 3 reps - 1 sets - 30 second hold hold - 3x daily - 7x weekly  Shoulder External Rotation and Scapular Retraction with Resistance - 10 reps - 3 sets - 2x daily - 7x weekly  Scapular Retraction with Resistance - 10 reps - 3 sets - 2x daily - 7x weekly  Scapular Retraction with Resistance Advanced - 10 reps - 3 sets - 2x daily - 7x weekly  Patient Education  Trigger Point Dry Needling

## 2018-07-20 NOTE — Therapy (Signed)
[place], [place], [address] Phone: [phone]   Fax:  [phone]  Physical Therapy Treatment  Patient Details  Name: [name] MRN: [id number] Date of Birth: Oct 31, 1965 Referring Provider (PT): Dr [name]    Encounter Date: 07/20/2018  PT End of Session - 07/20/18 0809    Visit Number  2    Number of Visits  12    Date for PT Re-Evaluation  08/25/18    PT Start Time  0804    PT Stop Time  0900    PT Time Calculation (min)  56 min    Activity Tolerance  Patient tolerated treatment well       Past Medical History:  Diagnosis Date  . Allergy   . Anxiety   . Arthritis   . Asthma    History of Asthma  . Bipolar disorder ([place])   . Chest pain, atypical 11/30/2012  . Clotting disorder ([place])   . Depression   . Diabetes mellitus without complication ([place])    type 2, pre-diabetic before she lost weight  . GERD (gastroesophageal reflux disease)   . H/O degenerative disc disease    L4-L5, L5-S1  . Heart murmur   . Hyperglycemia    Postoperative hyperglycemia  . Hypertension    hx of  . 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 Obesity 07/14/2012  . OSA (obstructive sleep apnea)    mild  . Pneumonia   . Polycystic disease, ovaries   . Postoperative anemia    2018 - while dieting  hx of  . Reflux   . Sinus tachycardia 07/14/2012   Pt says HR > 100 is consistent  . Sleep apnea    has had two tests, different results- no CPAP used    Past Surgical History:  Procedure Laterality Date  . COLONOSCOPY    . FOOT SURGERY     Right plantar facsiitis scrape  . KNEE SURGERY Bilateral 2007  . LAPAROSCOPIC GASTRIC SLEEVE RESECTION N/A 03/30/2017   Procedure: LAPAROSCOPIC GASTRIC SLEEVE RESECTION, UPPER ENDO;  Surgeon: Greer Pickerel, MD;  Location: [facility];  Service: General;  Laterality: N/A;  . NASAL SINUS SURGERY  2004  . OVARIAN CYST REMOVAL   1998   A cyst on the fallopian tube removed  . SPINAL FUSION    . TONSILLECTOMY  1996    There were no vitals filed for this visit.  Subjective Assessment - 07/20/18 0814    Subjective  Sore today. Difficulty sleeping. Workingon her exercises at home     Currently in Pain?  Yes    Pain Score  4     Pain Location  Neck    Pain Orientation  Upper;Mid;Lower;Left;Right    Pain Descriptors / Indicators  Aching;Burning;Tingling    Pain Type  Acute pain                       [facility] Adult PT Treatment/Exercise - 07/20/18 0001      Shoulder Exercises: Standing   Extension  Strengthening;Right;Left;10 reps;Theraband    Theraband Level (Shoulder Extension)  Level 1 (Yellow)    Row  Strengthening;Right;Left;10 reps;Theraband    Theraband Level (Shoulder Row)  Level 1 (Yellow)    Retraction  Strengthening;Right;Left;10 reps;Theraband    Theraband Level (Shoulder Retraction)  Level 1 (Yellow)    Other Standing Exercises  scap squeeze 10 sec x  5; L's x 10; W's x 10 with swim noodle       Shoulder Exercises: Stretch   Other Shoulder Stretches  3 way doorway stretch 30 sec x 1 rep       Moist Heat Therapy   Number Minutes Moist Heat  20 Minutes    Moist Heat Location  Cervical;Shoulder   Lt and thoracic spine      Electrical Stimulation   Electrical Stimulation Location  bilat cervical Lt shoulder area     Electrical Stimulation Action  IFC    Electrical Stimulation Parameters  to tolerance     Electrical Stimulation Goals  Pain;Tone      Manual Therapy   Manual therapy comments  pt prone     Soft tissue mobilization  pt supine - working through the post cervical and thoracic musculature; upper traps primarily Lt    Myofascial Release  posterior cervical/thoracic paraspinals        Trigger Point Dry Needling - 07/20/18 0842    Consent Given?  Yes    Education Handout Provided  Yes    Muscles Treated Upper Body  Upper trapezius;Rhomboids;Longissimus   Lt    Upper  Trapezius Response  Palpable increased muscle length    Rhomboids Response  Palpable increased muscle length    Longissimus Response  Palpable increased muscle length   cervical and thoracic paraspinals           PT Education - 07/20/18 0820    Education Details  HEP     Person(s) Educated  Patient    Methods  Explanation;Demonstration;Tactile cues;Verbal cues;Handout    Comprehension  Verbalized understanding;Returned demonstration;Verbal cues required;Tactile cues required          PT Long Term Goals - 07/14/18 0846      PT LONG TERM GOAL #1   Title  Improve posture and alignment with patient to demonstrate more upright posture and alignment 08/25/2018    Time  6    Period  Weeks    Status  New      PT LONG TERM GOAL #2   Title  Increased AROM Lt shoudler to equal or greater than Rt 08/25/2018    Time  6    Period  Weeks    Status  New      PT LONG TERM GOAL #3   Title  Decrease cervical pain and headaches to 3/10 to 5/10 at most for functional activities and ADL's 08/25/2018    Time  6    Period  Weeks    Status  New      PT LONG TERM GOAL #4   Title  Independent in HEP 08/25/2018    Time  6    Period  Weeks    Status  New      PT LONG TERM GOAL #5   Title  Improve FOTO to </= 38% limitaion 08/25/2018    Time  6    Period  Weeks    Status  New            Plan - 07/20/18 0810    Clinical Impression Statement  Less pain, continued soreness and tightness. Patient reports that she has been working on her exercises at home. Added posterior shoulder girdle strengthening and DN without difficulty.     Rehab Potential  Good    Clinical Impairments Affecting Rehab Potential  chronic pain     PT Frequency  2x / week    PT Duration  6 weeks    PT Treatment/Interventions  Patient/family education;ADLs/Self Care Home Management;Cryotherapy;Electrical Stimulation;Iontophoresis 4mg /ml Dexamethasone;Moist Heat;Ultrasound;Dry needling;Manual techniques;Neuromuscular  re-education;Functional mobility training;Therapeutic activities;Therapeutic exercise    PT Next Visit Plan  cervical stretching; posterior shoulder girdle strengthening; assess response to DN and manual work; modalities as indicated     PT Home Exercise Plan  Access Code: [email or web address]        Patient will benefit from skilled therapeutic intervention in order to improve the following deficits and impairments:  Postural dysfunction, Improper body mechanics, Pain, Increased fascial restricitons, Increased muscle spasms, Hypomobility, Decreased mobility, Decreased range of motion, Decreased activity tolerance  Visit Diagnosis: Cervicalgia  Acute intractable tension-type headache  Other symptoms and signs involving the musculoskeletal system  Abnormal posture     Problem List Patient Active Problem List   Diagnosis Date Noted  . Positive colorectal cancer screening using Cologuard test 03/02/2018  . Leukocytosis 01/26/2018  . Onychomycosis of great toe 12/22/2017  . Family history of DVT 03/20/2017  . Arthritis of right knee 05/13/2016  . Dyslipidemia 03/04/2016  . Right knee pain 03/03/2016  . Pre-diabetes 11/01/2015  . Allergic rhinitis 05/28/2015  . BMI 33.0-33.9,adult 05/24/2013  . Polycystic ovary disease 07/14/2012  . Obstructive sleep apnea (adult) (pediatric) 02/04/2012  . Perimenopausal 11/10/2011  . Disturbance in sleep behavior 09/16/2011  . Asthma 05/23/2011  . Backache 05/23/2011  . 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 Dysphagia 05/23/2011  . Esophageal reflux 05/23/2011     Nilda Simmer PT, MPH  07/20/2018, 8:43 AM  [facility] [place], [place], [address] Phone: [phone]   Fax:  [phone]  Name: [name] MRN: [id number] Date of Birth: [birth year]

## 2018-07-27 ENCOUNTER — Ambulatory Visit: Payer: Medicare HMO | Admitting: Rehabilitative and Restorative Service Providers"

## 2018-07-27 ENCOUNTER — Encounter: Payer: Self-pay | Admitting: Rehabilitative and Restorative Service Providers"

## 2018-07-27 DIAGNOSIS — M542 Cervicalgia: Secondary | ICD-10-CM | POA: Diagnosis not present

## 2018-07-27 DIAGNOSIS — R29898 Other symptoms and signs involving the musculoskeletal system: Secondary | ICD-10-CM | POA: Diagnosis not present

## 2018-07-27 DIAGNOSIS — R293 Abnormal posture: Secondary | ICD-10-CM

## 2018-07-27 DIAGNOSIS — G44201 Tension-type headache, unspecified, intractable: Secondary | ICD-10-CM

## 2018-07-27 NOTE — Therapy (Signed)
[place], [place], [address] Phone: [phone]   Fax:  [phone]  Physical Therapy Treatment  Patient Details  Name: [name] MRN: [id number] Date of Birth: 03-28-1966 Referring Provider (PT): Dr [name]    Encounter Date: 07/27/2018  PT End of Session - 07/27/18 0805    Visit Number  3    Number of Visits  12    Date for PT Re-Evaluation  08/25/18    PT Start Time  0801    PT Stop Time  2229    PT Time Calculation (min)  56 min    Activity Tolerance  Patient tolerated treatment well       Past Medical History:  Diagnosis Date  . Allergy   . Anxiety   . Arthritis   . Asthma    History of Asthma  . Bipolar disorder ([place])   . Chest pain, atypical 11/30/2012  . Clotting disorder ([place])   . Depression   . Diabetes mellitus without complication ([place])    type 2, pre-diabetic before she lost weight  . GERD (gastroesophageal reflux disease)   . H/O degenerative disc disease    L4-L5, L5-S1  . Heart murmur   . Hyperglycemia    Postoperative hyperglycemia  . Hypertension    hx of  . 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 Obesity 07/14/2012  . OSA (obstructive sleep apnea)    mild  . Pneumonia   . Polycystic disease, ovaries   . Postoperative anemia    2018 - while dieting  hx of  . Reflux   . Sinus tachycardia 07/14/2012   Pt says HR > 100 is consistent  . Sleep apnea    has had two tests, different results- no CPAP used    Past Surgical History:  Procedure Laterality Date  . COLONOSCOPY    . FOOT SURGERY     Right plantar facsiitis scrape  . KNEE SURGERY Bilateral 2007  . LAPAROSCOPIC GASTRIC SLEEVE RESECTION N/A 03/30/2017   Procedure: LAPAROSCOPIC GASTRIC SLEEVE RESECTION, UPPER ENDO;  Surgeon: Greer Pickerel, MD;  Location: [facility];  Service: General;  Laterality: N/A;  . NASAL SINUS SURGERY  2004  . OVARIAN CYST REMOVAL   1998   A cyst on the fallopian tube removed  . SPINAL FUSION    . TONSILLECTOMY  1996    There were no vitals filed for this visit.  Subjective Assessment - 07/27/18 0807    Subjective  Patient reports that the DN really seemed to help the shoulder - it is doing well. She continues to have some neck pain and HA - which has been worse the past 2 days.     Currently in Pain?  Yes    Pain Score  8     Pain Location  Neck    Pain Orientation  Upper;Mid;Right;Left    Pain Descriptors / Indicators  Aching;Burning;Tingling    Pain Type  Acute pain    Pain Onset  More than a month ago    Pain Frequency  Constant                       [facility] Adult PT Treatment/Exercise - 07/27/18 0001      Shoulder Exercises: Prone   Other Prone Exercises  prone axial extension 5 sec x 5 reps       Shoulder Exercises:  Standing   Extension  Strengthening;Right;Left;10 reps;Theraband    Theraband Level (Shoulder Extension)  Level 1 (Yellow)    Row  Strengthening;Right;Left;10 reps;Theraband    Theraband Level (Shoulder Row)  Level 1 (Yellow)    Retraction  Strengthening;Right;Left;10 reps;Theraband    Theraband Level (Shoulder Retraction)  Level 1 (Yellow)    Other Standing Exercises  scap squeeze 10 sec x 5; L's x 10; W's x 10 with swim noodle       Shoulder Exercises: ROM/Strengthening   UBE (Upper Arm Bike)  L2 x 4 min alt fwd/back       Shoulder Exercises: Stretch   Other Shoulder Stretches  3 way doorway stretch 30 sec x 1 rep     Other Shoulder Stretches  lateral cervical flexion 5 sec x 3 each side       Moist Heat Therapy   Number Minutes Moist Heat  20 Minutes    Moist Heat Location  Cervical;Shoulder   Lt and thoracic spine      Electrical Stimulation   Electrical Stimulation Location  bilat cervical Lt shoulder area     Electrical Stimulation Action  IFC    Electrical Stimulation Parameters  to tolerance    Electrical Stimulation Goals  Pain;Tone      Manual Therapy    Manual therapy comments  pt prone and supine     Soft tissue mobilization  pt supine - working through the post cervical and thoracic musculature; upper traps primarily Lt    Myofascial Release  posterior cervical/thoracic paraspinals        Trigger Point Dry Needling - 07/27/18 0840    Consent Given?  Yes    Muscles Treated Upper Body  Upper trapezius;Longissimus   bilat    Upper Trapezius Response  Palpable increased muscle length    Longissimus Response  Palpable increased muscle length   cervical           PT Education - 07/27/18 0843    Education Details  HEP     Person(s) Educated  Patient    Methods  Explanation;Demonstration;Tactile cues;Verbal cues;Handout    Comprehension  Verbalized understanding;Returned demonstration;Verbal cues required;Tactile cues required          PT Long Term Goals - 07/14/18 0846      PT LONG TERM GOAL #1   Title  Improve posture and alignment with patient to demonstrate more upright posture and alignment 08/25/2018    Time  6    Period  Weeks    Status  New      PT LONG TERM GOAL #2   Title  Increased AROM Lt shoudler to equal or greater than Rt 08/25/2018    Time  6    Period  Weeks    Status  New      PT LONG TERM GOAL #3   Title  Decrease cervical pain and headaches to 3/10 to 5/10 at most for functional activities and ADL's 08/25/2018    Time  6    Period  Weeks    Status  New      PT LONG TERM GOAL #4   Title  Independent in HEP 08/25/2018    Time  6    Period  Weeks    Status  New      PT LONG TERM GOAL #5   Title  Improve FOTO to </= 38% limitaion 08/25/2018    Time  6    Period  Weeks    Status  New            Plan - 07/27/18 0807    Clinical Impression Statement  Shoulder is improving; neck and head continue to hurt. Patient reports that she is working on 5 D crossstitch - forward position for head and shoulders. Good response to manual work and DN followed by modalities. Added exercise for neck and  shoulder with decrease in pain reported.     Rehab Potential  Good    Clinical Impairments Affecting Rehab Potential  chronic pain     PT Frequency  2x / week    PT Duration  6 weeks    PT Treatment/Interventions  Patient/family education;ADLs/Self Care Home Management;Cryotherapy;Electrical Stimulation;Iontophoresis 4mg /ml Dexamethasone;Moist Heat;Ultrasound;Dry needling;Manual techniques;Neuromuscular re-education;Functional mobility training;Therapeutic activities;Therapeutic exercise    PT Next Visit Plan  cervical stretching; posterior shoulder girdle strengthening; assess response to DN and manual work; modalities as indicated     PT Home Exercise Plan  Access Code: [email or web address]        Patient will benefit from skilled therapeutic intervention in order to improve the following deficits and impairments:  Postural dysfunction, Improper body mechanics, Pain, Increased fascial restricitons, Increased muscle spasms, Hypomobility, Decreased mobility, Decreased range of motion, Decreased activity tolerance  Visit Diagnosis: Cervicalgia  Acute intractable tension-type headache  Other symptoms and signs involving the musculoskeletal system  Abnormal posture     Problem List Patient Active Problem List   Diagnosis Date Noted  . Positive colorectal cancer screening using Cologuard test 03/02/2018  . Leukocytosis 01/26/2018  . Onychomycosis of great toe 12/22/2017  . Family history of DVT 03/20/2017  . Arthritis of right knee 05/13/2016  . Dyslipidemia 03/04/2016  . Right knee pain 03/03/2016  . Pre-diabetes 11/01/2015  . Allergic rhinitis 05/28/2015  . BMI 33.0-33.9,adult 05/24/2013  . Polycystic ovary disease 07/14/2012  . Obstructive sleep apnea (adult) (pediatric) 02/04/2012  . Perimenopausal 11/10/2011  . Disturbance in sleep behavior 09/16/2011  . Asthma 05/23/2011  . Backache 05/23/2011  . 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 Dysphagia 05/23/2011  .  Esophageal reflux 05/23/2011    Celyn Nilda Simmer PT, MPH  07/27/2018, 8:45 AM  [facility] [place], [place], [address] Phone: [phone]   Fax:  [phone]  Name: [name] MRN: [id number] Date of Birth: [birth year]

## 2018-07-27 NOTE — Patient Instructions (Addendum)
Shoulder Blade Squeeze: Arms at Sides    Arms at sides, parallel, elbows straight, palms up. Press pelvis down. Squeeze backbone with shoulder blades, raising front of shoulders, chest, and arms. Keep head and neck neutral. Hold _5__ seconds. Relax. Repeat __5-10_ times.  Side Bend, Sitting keep chin tucked     Sit, head in comfortable, centered position, chin slightly tucked. Gently tilt head, bringing ear toward same-side shoulder. Hold _3-5__ seconds.  Repeat __5_ times per session. Do __3-4_ sessions per day.

## 2018-07-29 ENCOUNTER — Encounter: Payer: Self-pay | Admitting: Rehabilitative and Restorative Service Providers"

## 2018-08-02 ENCOUNTER — Other Ambulatory Visit: Payer: Self-pay | Admitting: Psychiatry

## 2018-08-02 ENCOUNTER — Encounter: Payer: Self-pay | Admitting: Rehabilitative and Restorative Service Providers"

## 2018-08-02 ENCOUNTER — Other Ambulatory Visit: Payer: Self-pay | Admitting: Family Medicine

## 2018-08-02 DIAGNOSIS — R69 Illness, unspecified: Secondary | ICD-10-CM | POA: Diagnosis not present

## 2018-08-03 ENCOUNTER — Ambulatory Visit: Payer: Medicare HMO | Admitting: Rehabilitative and Restorative Service Providers"

## 2018-08-03 ENCOUNTER — Encounter: Payer: Self-pay | Admitting: Rehabilitative and Restorative Service Providers"

## 2018-08-03 DIAGNOSIS — R293 Abnormal posture: Secondary | ICD-10-CM

## 2018-08-03 DIAGNOSIS — R29898 Other symptoms and signs involving the musculoskeletal system: Secondary | ICD-10-CM

## 2018-08-03 DIAGNOSIS — G44201 Tension-type headache, unspecified, intractable: Secondary | ICD-10-CM

## 2018-08-03 DIAGNOSIS — M542 Cervicalgia: Secondary | ICD-10-CM

## 2018-08-03 NOTE — Therapy (Addendum)
[place], [place], [address] Phone: [phone]   Fax:  [phone]  Physical Therapy Treatment  Patient Details  Name: [name] MRN: [id number] Date of Birth: [birth year] Referring Provider (PT): Dr [name]    Encounter Date: 08/03/2018  PT End of Session - 08/03/18 0803    Visit Number  4    Number of Visits  12    Date for PT Re-Evaluation  08/25/18    PT Start Time  0801    PT Stop Time  0858    PT Time Calculation (min)  57 min    Activity Tolerance  Patient tolerated treatment well       Past Medical History:  Diagnosis Date  . Allergy   . Anxiety   . Arthritis   . Asthma    History of Asthma  . Bipolar disorder ([place])   . Chest pain, atypical 11/30/2012  . Clotting disorder ([place])   . Depression   . Diabetes mellitus without complication ([place])    type 2, pre-diabetic before she lost weight  . GERD (gastroesophageal reflux disease)   . H/O degenerative disc disease    L4-L5, L5-S1  . Heart murmur   . Hyperglycemia    Postoperative hyperglycemia  . Hypertension    hx of  . 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 Obesity 07/14/2012  . OSA (obstructive sleep apnea)    mild  . Pneumonia   . Polycystic disease, ovaries   . Postoperative anemia    2018 - while dieting  hx of  . Reflux   . Sinus tachycardia 07/14/2012   Pt says HR > 100 is consistent  . Sleep apnea    has had two tests, different results- no CPAP used    Past Surgical History:  Procedure Laterality Date  . COLONOSCOPY    . FOOT SURGERY     Right plantar facsiitis scrape  . KNEE SURGERY Bilateral 2007  . LAPAROSCOPIC GASTRIC SLEEVE RESECTION N/A 03/30/2017   Procedure: LAPAROSCOPIC GASTRIC SLEEVE RESECTION, UPPER ENDO;  Surgeon: Greer Pickerel, MD;  Location: [facility];  Service: General;  Laterality: N/A;  . NASAL SINUS SURGERY  2004  . OVARIAN CYST REMOVAL   1998   A cyst on the fallopian tube removed  . SPINAL FUSION    . TONSILLECTOMY  1996    There were no vitals filed for this visit.  Subjective Assessment - 08/03/18 0803    Subjective  Feeling better. No HA and neck feels good. Has not done her 5D crossstitch this week. She is doing her exercises at home. Making progress.     Currently in Pain?  No/denies         [facility] PT Assessment - 08/03/18 0001      Assessment   Medical Diagnosis  Cervical dysfunction     Referring Provider (PT)  Dr [name]     Onset Date/Surgical Date  05/07/18    Hand Dominance  Right    Next MD Visit  08/12/2018    Prior Therapy  yes here for LBP; knee pain; Lt shoulder pain       Observation/Other Assessments   Focus on Therapeutic Outcomes (FOTO)   37% limitation       Posture/Postural Control   Posture Comments  improving posture and alignment       AROM   Right Shoulder Flexion  157 Degrees    Right Shoulder ABduction  167 Degrees    Right Shoulder External Rotation  90 Degrees    Left Shoulder Flexion  136 Degrees    Left Shoulder ABduction  140 Degrees    Left Shoulder External Rotation  91 Degrees    Cervical Flexion  65    Cervical Extension  46    Cervical - Right Side Bend  43    Cervical - Left Side Bend  35    Cervical - Right Rotation  66    Cervical - Left Rotation  54      Strength   Overall Strength Comments  WFL's bilat UE's       Palpation   Spinal mobility  hypomobility thoracic and cervical spine with CPA mobs     Palpation comment  continued muscular tightness bilat ant/lat/posterior cervical muaculature; pecs; upper traps; leveator; teres                    [facility] Adult PT Treatment/Exercise - 08/03/18 0001      Shoulder Exercises: Standing   Extension  Strengthening;Right;Left;10 reps;Theraband    Theraband Level (Shoulder Extension)  Level 1 (Yellow)    Row  Strengthening;Right;Left;10 reps;Theraband    Theraband Level (Shoulder Row)  Level 1  (Yellow)    Retraction  Strengthening;Right;Left;10 reps;Theraband    Theraband Level (Shoulder Retraction)  Level 1 (Yellow)    Other Standing Exercises  scap squeeze 10 sec x 5; L's x 10; W's x 10 with swim noodle       Shoulder Exercises: ROM/Strengthening   UBE (Upper Arm Bike)  L2 x 2 min alt fwd/back       Shoulder Exercises: Stretch   Other Shoulder Stretches  3 way doorway stretch 30 sec x 1 rep     Other Shoulder Stretches  lateral cervical flexion 5 sec x 3 each side       Moist Heat Therapy   Number Minutes Moist Heat  20 Minutes    Moist Heat Location  Cervical;Shoulder   Lt and thoracic spine      Electrical Stimulation   Electrical Stimulation Location  bilat cervical Lt shoulder area     Electrical Stimulation Action  IFC    Electrical Stimulation Parameters  to tolerance    Electrical Stimulation Goals  Pain;Tone      Manual Therapy   Manual therapy comments  pt supine     Soft tissue mobilization  working through the post cervical and thoracic musculature; upper traps primarily Lt    Myofascial Release  lateral cervical/thoracic paraspinals                   PT Long Term Goals - 08/03/18 0810      PT LONG TERM GOAL #1   Title  Improve posture and alignment with patient to demonstrate more upright posture and alignment 08/25/2018    Time  6    Period  Weeks    Status  Achieved      PT LONG TERM GOAL #2   Title  Increased AROM Lt shoudler to equal or greater than Rt 08/25/2018    Time  6    Period  Weeks    Status  Achieved      PT LONG TERM GOAL #3   Title  Decrease cervical pain and headaches to 3/10 to 5/10 at most for functional activities and ADL's 08/25/2018    Time  6  Period  Weeks    Status  Achieved      PT LONG TERM GOAL #4   Title  Independent in HEP 08/25/2018    Time  6    Period  Weeks    Status  Achieved      PT LONG TERM GOAL #5   Title  Improve FOTO to </= 38% limitaion 08/25/2018    Time  6    Period  Weeks     Status  Achieved            Plan - 08/03/18 0805    Clinical Impression Statement  Continued improvement in shoulder, neck and headache. Paitent is working on her exercises at home and has not done the 5D cross-stitch this week. She demonstrates increasing ROM with decreased palpable tightness. Accomplished all goals of therapy.     Rehab Potential  Good    Clinical Impairments Affecting Rehab Potential  chronic pain     PT Frequency  2x / week    PT Duration  6 weeks    PT Treatment/Interventions  Patient/family education;ADLs/Self Care Home Management;Cryotherapy;Electrical Stimulation;Iontophoresis 62m/ml Dexamethasone;Moist Heat;Ultrasound;Dry needling;Manual techniques;Neuromuscular re-education;Functional mobility training;Therapeutic activities;Therapeutic exercise    PT Next Visit Plan  cervical stretching; posterior shoulder girdle strengthening; continue DN and manual work as indicated; modalities as indicated; hold PT pending MD visit - note to MD     PT Home Exercise Plan  Access Code: [email or web address]        Patient will benefit from skilled therapeutic intervention in order to improve the following deficits and impairments:  Postural dysfunction, Improper body mechanics, Pain, Increased fascial restricitons, Increased muscle spasms, Hypomobility, Decreased mobility, Decreased range of motion, Decreased activity tolerance  Visit Diagnosis: Cervicalgia  Acute intractable tension-type headache  Other symptoms and signs involving the musculoskeletal system  Abnormal posture     Problem List Patient Active Problem List   Diagnosis Date Noted  . Positive colorectal cancer screening using Cologuard test 03/02/2018  . Leukocytosis 01/26/2018  . Onychomycosis of great toe 12/22/2017  . Family history of DVT 03/20/2017  . Arthritis of right knee 05/13/2016  . Dyslipidemia 03/04/2016  . Right knee pain 03/03/2016  . Pre-diabetes 11/01/2015  . Allergic rhinitis 05/28/2015  .  BMI 33.0-33.9,adult 05/24/2013  . Polycystic ovary disease 07/14/2012  . Obstructive sleep apnea (adult) (pediatric) 02/04/2012  . Perimenopausal 11/10/2011  . Disturbance in sleep behavior 09/16/2011  . Asthma 05/23/2011  . Backache 05/23/2011  . Bipolar disorder in partial remission ([place] 05/23/2011  . Dysphagia 05/23/2011  . Esophageal reflux 05/23/2011    Celyn P Holt Pt, MPH  08/03/2018, 8:40 AM  [facility] [address] [address]Phone: [phone]  Fax:  [phone] Name: [name] DEIHLMRN: [id number] of Birth: [birth year] PHYSICAL THERAPY DISCHARGE SUMMARY  Visits from Start of Care: 4  Current functional level related to goals / functional outcomes: See progress note for discharge status    Remaining deficits: Unknown    Education / Equipment: HEP  Plan: Patient agrees to discharge.  Patient goals were partially met. Patient is being discharged due to being pleased with the current functional level.  ?????    Celyn P. HHelene KelpPT, MPH 09/02/18 9:33 AM

## 2018-08-09 ENCOUNTER — Ambulatory Visit: Payer: Self-pay | Admitting: Family Medicine

## 2018-08-09 DIAGNOSIS — G894 Chronic pain syndrome: Secondary | ICD-10-CM | POA: Diagnosis not present

## 2018-08-09 DIAGNOSIS — M62830 Muscle spasm of back: Secondary | ICD-10-CM | POA: Diagnosis not present

## 2018-08-09 DIAGNOSIS — K5900 Constipation, unspecified: Secondary | ICD-10-CM | POA: Diagnosis not present

## 2018-08-09 DIAGNOSIS — M47816 Spondylosis without myelopathy or radiculopathy, lumbar region: Secondary | ICD-10-CM | POA: Diagnosis not present

## 2018-08-30 DIAGNOSIS — J209 Acute bronchitis, unspecified: Secondary | ICD-10-CM | POA: Diagnosis not present

## 2018-08-30 DIAGNOSIS — J45909 Unspecified asthma, uncomplicated: Secondary | ICD-10-CM | POA: Diagnosis not present

## 2018-08-30 DIAGNOSIS — J069 Acute upper respiratory infection, unspecified: Secondary | ICD-10-CM | POA: Diagnosis not present

## 2018-08-30 DIAGNOSIS — R0602 Shortness of breath: Secondary | ICD-10-CM | POA: Diagnosis not present

## 2018-09-07 DIAGNOSIS — R69 Illness, unspecified: Secondary | ICD-10-CM | POA: Diagnosis not present

## 2018-09-09 DIAGNOSIS — M47816 Spondylosis without myelopathy or radiculopathy, lumbar region: Secondary | ICD-10-CM | POA: Diagnosis not present

## 2018-09-09 DIAGNOSIS — M62830 Muscle spasm of back: Secondary | ICD-10-CM | POA: Diagnosis not present

## 2018-09-09 DIAGNOSIS — K5900 Constipation, unspecified: Secondary | ICD-10-CM | POA: Diagnosis not present

## 2018-09-09 DIAGNOSIS — G894 Chronic pain syndrome: Secondary | ICD-10-CM | POA: Diagnosis not present

## 2018-09-13 DIAGNOSIS — R69 Illness, unspecified: Secondary | ICD-10-CM | POA: Diagnosis not present

## 2018-09-14 DIAGNOSIS — R69 Illness, unspecified: Secondary | ICD-10-CM | POA: Diagnosis not present

## 2018-09-28 ENCOUNTER — Other Ambulatory Visit: Payer: Self-pay | Admitting: Family Medicine

## 2018-09-28 NOTE — Telephone Encounter (Signed)
Patient reports she is still having some toe fungus. Ok to refill?  Please advise.

## 2018-10-10 ENCOUNTER — Other Ambulatory Visit: Payer: Self-pay | Admitting: Psychiatry

## 2018-10-12 DIAGNOSIS — G894 Chronic pain syndrome: Secondary | ICD-10-CM | POA: Diagnosis not present

## 2018-10-12 DIAGNOSIS — Z79891 Long term (current) use of opiate analgesic: Secondary | ICD-10-CM | POA: Diagnosis not present

## 2018-10-12 DIAGNOSIS — M47816 Spondylosis without myelopathy or radiculopathy, lumbar region: Secondary | ICD-10-CM | POA: Diagnosis not present

## 2018-10-12 DIAGNOSIS — K5900 Constipation, unspecified: Secondary | ICD-10-CM | POA: Diagnosis not present

## 2018-10-12 DIAGNOSIS — R69 Illness, unspecified: Secondary | ICD-10-CM | POA: Diagnosis not present

## 2018-10-12 DIAGNOSIS — M62830 Muscle spasm of back: Secondary | ICD-10-CM | POA: Diagnosis not present

## 2018-10-25 ENCOUNTER — Telehealth: Payer: Self-pay | Admitting: Family Medicine

## 2018-10-25 DIAGNOSIS — R69 Illness, unspecified: Secondary | ICD-10-CM | POA: Diagnosis not present

## 2018-10-25 NOTE — Telephone Encounter (Signed)
Received a fax from the pharmacy that insurance will only pay for 90 tablets for a whole year and patient will need more than the 90 for the year per PCP. Waiting on a response from insurance.

## 2018-10-27 ENCOUNTER — Other Ambulatory Visit: Payer: Self-pay | Admitting: Obstetrics & Gynecology

## 2018-10-27 NOTE — Telephone Encounter (Signed)
Approvedon April 21 PA Case: [id number], Status: Approved, Coverage Starts on: 07/07/2018, Coverage Ends on: 07/07/2019. Questions? Contact [phone]. Pharmacy aware.

## 2018-11-05 DIAGNOSIS — R69 Illness, unspecified: Secondary | ICD-10-CM | POA: Diagnosis not present

## 2018-11-08 ENCOUNTER — Encounter (HOSPITAL_COMMUNITY): Payer: Self-pay

## 2018-11-08 DIAGNOSIS — R69 Illness, unspecified: Secondary | ICD-10-CM | POA: Diagnosis not present

## 2018-11-09 DIAGNOSIS — G894 Chronic pain syndrome: Secondary | ICD-10-CM | POA: Diagnosis not present

## 2018-11-09 DIAGNOSIS — M47816 Spondylosis without myelopathy or radiculopathy, lumbar region: Secondary | ICD-10-CM | POA: Diagnosis not present

## 2018-11-09 DIAGNOSIS — M62830 Muscle spasm of back: Secondary | ICD-10-CM | POA: Diagnosis not present

## 2018-11-09 DIAGNOSIS — K5900 Constipation, unspecified: Secondary | ICD-10-CM | POA: Diagnosis not present

## 2018-11-17 DIAGNOSIS — R69 Illness, unspecified: Secondary | ICD-10-CM | POA: Diagnosis not present

## 2018-11-19 ENCOUNTER — Other Ambulatory Visit: Payer: Self-pay | Admitting: Obstetrics & Gynecology

## 2018-12-06 DIAGNOSIS — R69 Illness, unspecified: Secondary | ICD-10-CM | POA: Diagnosis not present

## 2018-12-07 DIAGNOSIS — M47815 Spondylosis without myelopathy or radiculopathy, thoracolumbar region: Secondary | ICD-10-CM | POA: Diagnosis not present

## 2018-12-13 DIAGNOSIS — R69 Illness, unspecified: Secondary | ICD-10-CM | POA: Diagnosis not present

## 2018-12-22 ENCOUNTER — Other Ambulatory Visit: Payer: Self-pay | Admitting: Family Medicine

## 2018-12-22 ENCOUNTER — Other Ambulatory Visit: Payer: Self-pay | Admitting: Psychiatry

## 2019-01-03 DIAGNOSIS — R69 Illness, unspecified: Secondary | ICD-10-CM | POA: Diagnosis not present

## 2019-01-05 DIAGNOSIS — K5900 Constipation, unspecified: Secondary | ICD-10-CM | POA: Diagnosis not present

## 2019-01-05 DIAGNOSIS — M62830 Muscle spasm of back: Secondary | ICD-10-CM | POA: Diagnosis not present

## 2019-01-05 DIAGNOSIS — G894 Chronic pain syndrome: Secondary | ICD-10-CM | POA: Diagnosis not present

## 2019-01-05 DIAGNOSIS — M47816 Spondylosis without myelopathy or radiculopathy, lumbar region: Secondary | ICD-10-CM | POA: Diagnosis not present

## 2019-01-08 DIAGNOSIS — H019 Unspecified inflammation of eyelid: Secondary | ICD-10-CM | POA: Diagnosis not present

## 2019-01-10 DIAGNOSIS — R69 Illness, unspecified: Secondary | ICD-10-CM | POA: Diagnosis not present

## 2019-01-24 DIAGNOSIS — R69 Illness, unspecified: Secondary | ICD-10-CM | POA: Diagnosis not present

## 2019-02-01 DIAGNOSIS — R69 Illness, unspecified: Secondary | ICD-10-CM | POA: Diagnosis not present

## 2019-02-02 DIAGNOSIS — G894 Chronic pain syndrome: Secondary | ICD-10-CM | POA: Diagnosis not present

## 2019-02-02 DIAGNOSIS — K5900 Constipation, unspecified: Secondary | ICD-10-CM | POA: Diagnosis not present

## 2019-02-02 DIAGNOSIS — M47816 Spondylosis without myelopathy or radiculopathy, lumbar region: Secondary | ICD-10-CM | POA: Diagnosis not present

## 2019-02-02 DIAGNOSIS — M62830 Muscle spasm of back: Secondary | ICD-10-CM | POA: Diagnosis not present

## 2019-02-16 ENCOUNTER — Other Ambulatory Visit: Payer: Self-pay | Admitting: Family Medicine

## 2019-02-22 ENCOUNTER — Ambulatory Visit (INDEPENDENT_AMBULATORY_CARE_PROVIDER_SITE_OTHER): Payer: Medicare HMO | Admitting: Family Medicine

## 2019-02-22 ENCOUNTER — Encounter: Payer: Self-pay | Admitting: Family Medicine

## 2019-02-22 VITALS — Temp 99.8°F | Wt 205.0 lb

## 2019-02-22 DIAGNOSIS — R69 Illness, unspecified: Secondary | ICD-10-CM | POA: Diagnosis not present

## 2019-02-22 DIAGNOSIS — M542 Cervicalgia: Secondary | ICD-10-CM

## 2019-02-22 DIAGNOSIS — R51 Headache: Secondary | ICD-10-CM

## 2019-02-22 DIAGNOSIS — R519 Headache, unspecified: Secondary | ICD-10-CM

## 2019-02-22 NOTE — Telephone Encounter (Signed)
-----   Message from Gregor Hams, MD sent at 02/22/2019 10:16 AM EDT ----- Regarding: recheck 4 weeks Recheck 4 weeks neck pain and headache in person.

## 2019-02-22 NOTE — Progress Notes (Signed)
Virtual Visit  via Video Note  I connected with      [name] by a video enabled telemedicine application and verified that I am speaking with the correct person using two identifiers.   I discussed the limitations of evaluation and management by telemedicine and the availability of in person appointments. The patient expressed understanding and agreed to proceed.  History of Present Illness: [name] is a 53 y.o. female who would like to discuss headache   [name] has had a headache present for about 2 months.  She notes the headache is located at the base of the skull.  Headache is present for most of the day and does not tend to change very much throughout the day.  She notes her headache can be worsened some with neck motion.  She is had problems in the past with neck pain.  She is 10 trials of physical therapy which did improve neck pain.  She notes her pain was lower down on her neck more towards her trapezius.  She has had some degenerative changes seen on MRI of her C-spine few years ago.  She denies any weakness or numbness or loss of function radiating pain.  She denies any vision change.  She denies any chronic history of headaches.  She does have chronic pain and uses tizanidine which does not help her neck very much.  She is had trouble with other muscle relaxers in the past.  She tried Tylenol and ibuprofen for headache which helped only a little.  Observations/Objective: Temp 99.8 F (37.7 C)   Wt 205 lb (93 kg)   LMP 08/22/2016 (Approximate)   BMI 32.11 kg/m   Wt Readings from Last 5 Encounters:  02/22/19 205 lb (93 kg)  07/09/18 216 lb (98 kg)  06/22/18 212 lb (96.2 kg)  04/21/18 211 lb (95.7 kg)  04/13/18 211 lb 1.6 oz (95.8 kg)   Exam: Appearance nontoxic appearing Normal Speech.  Alert and oriented normal coordination and speech and thought process.  Lab and Radiology Results No results found for this or any previous visit (from the past 72  hour(s)). No results found.   Assessment and Plan: 53 y.o. female with headache.  Headache is more posterior.  I think it is most likely related to cervical spine dysfunction or spasm.  We discussed options.  Plan for trial of physical therapy.  Check back in a month.  Will reassess in person at that time and proceed with further work-up if needed.  Hopefully she will have some improvement with physical therapy.  Continue current medical regimen.  Addition informed patient that I am transitioning to sports medicine only started in November.  Obviously issues like this I can still see her for however primary care related issues will be transitioning to new provider.  Handout provided to patient.  PDMP not reviewed this encounter. Orders Placed This Encounter  Procedures  . Ambulatory referral to Physical Therapy    Referral Priority:   Routine    Referral Type:   Physical Medicine    Referral Reason:   Specialty Services Required    Requested Specialty:   Physical Therapy   No orders of the defined types were placed in this encounter.   Follow Up Instructions:    I discussed the assessment and treatment plan with the patient. The patient was provided an opportunity to ask questions and all were answered. The patient agreed with the plan and demonstrated an understanding of  the instructions.   The patient was advised to call back or seek an in-person evaluation if the symptoms worsen or if the condition fails to improve as anticipated.  Time: 25 minutes of intraservice time, with >39 minutes of total time during today's visit.      Historical information moved to improve visibility of documentation.  Past Medical History:  Diagnosis Date  . Allergy   . Anxiety   . Arthritis   . Asthma    History of Asthma  . Bipolar disorder ([place])   . Chest pain, atypical 11/30/2012  . Clotting disorder ([place])   . Depression   . Diabetes mellitus without complication ([place])    type 2,  pre-diabetic before she lost weight  . GERD (gastroesophageal reflux disease)   . H/O degenerative disc disease    L4-L5, L5-S1  . Heart murmur   . Hyperglycemia    Postoperative hyperglycemia  . Hypertension    hx of  . 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 Obesity 07/14/2012  . OSA (obstructive sleep apnea)    mild  . Pneumonia   . Polycystic disease, ovaries   . Postoperative anemia    2018 - while dieting  hx of  . Reflux   . Sinus tachycardia 07/14/2012   Pt says HR > 100 is consistent  . Sleep apnea    has had two tests, different results- no CPAP used   Past Surgical History:  Procedure Laterality Date  . COLONOSCOPY    . FOOT SURGERY     Right plantar facsiitis scrape  . KNEE SURGERY Bilateral 2007  . LAPAROSCOPIC GASTRIC SLEEVE RESECTION N/A 03/30/2017   Procedure: LAPAROSCOPIC GASTRIC SLEEVE RESECTION, UPPER ENDO;  Surgeon: Greer Pickerel, MD;  Location: [facility];  Service: General;  Laterality: N/A;  . NASAL SINUS SURGERY  2004  . OVARIAN CYST REMOVAL  1998   A cyst on the fallopian tube removed  . SPINAL FUSION    . TONSILLECTOMY  1996   Social History   Tobacco Use  . Smoking status: Never Smoker  . Smokeless tobacco: Never Used  Substance Use Topics  . Alcohol use: No   family history includes Cancer in her father, maternal grandmother, paternal aunt, paternal aunt, paternal grandmother, and paternal uncle; Colon cancer in her maternal grandmother; Diabetes in her father, mother, and paternal aunt.  Medications: Current Outpatient Medications  Medication Sig Dispense Refill  . albuterol (PROAIR HFA) 108 (90 BASE) MCG/ACT inhaler Inhale 2 puffs into the lungs every 4 (four) hours as needed for wheezing or shortness of breath.    Marland Kitchen amoxicillin-clavulanate (AUGMENTIN) 875-125 MG tablet Take 1 tablet by mouth 2 (two) times daily.    . budesonide-formoterol (SYMBICORT) 160-4.5 MCG/ACT inhaler Inhale 2 puffs into the  lungs 2 (two) times daily. 3 Inhaler 4  . buPROPion (WELLBUTRIN XL) 150 MG 24 hr tablet Take 150 mg by mouth daily with breakfast. Total daily dose=450mg     . buPROPion (WELLBUTRIN XL) 300 MG 24 hr tablet Take 300 mg by mouth daily with breakfast. Total daily dose=450mg     . Calcium Citrate-Vitamin D (CELEBRATE CALCIUM PLUS 500) 500-333 MG-UNIT CHEW Chew 1 tablet by mouth 3 (three) times daily.    . carbamazepine (TEGRETOL) 200 MG tablet Take 200 mg by mouth 2 (two) times daily.    Marland Kitchen docusate sodium (COLACE) 100 MG capsule Take 100 mg by mouth 2 (two) times daily.    Marland Kitchen  escitalopram (LEXAPRO) 20 MG tablet Take 1 tablet (20 mg total) by mouth daily with breakfast. 30 tablet 3  . estradiol (ESTRACE) 1 MG tablet TAKE 1 TABLET BY MOUTH EVERY DAY 90 tablet 3  . fentaNYL (DURAGESIC - DOSED MCG/HR) 50 MCG/HR Place 50 mcg onto the skin every 3 (three) days.     . fluconazole (DIFLUCAN) 150 MG tablet TAKE 1 TABLET BY MOUTH AS ONE DOSE    . fluticasone (FLONASE) 50 MCG/ACT nasal spray INSTILL 1 SPRAY IN EACH NOSTRIL ONCE DAILY AS NEEDED FOR ALLERGIES 16 mL 12  . medroxyPROGESTERone (PROVERA) 2.5 MG tablet TAKE 1 TABLET BY MOUTH EVERY DAY 90 tablet 3  . MOVANTIK 25 MG TABS tablet Take 25 mg by mouth at bedtime.     . Multiple Vitamins-Minerals (CELEBRATE MULTI-COMPLETE 36) CHEW Chew 1 tablet by mouth 2 (two) times daily.    . pantoprazole (PROTONIX) 40 MG tablet TAKE 1 TABLET BY MOUTH EVERY DAY 90 tablet 3  . QUEtiapine (SEROQUEL) 50 MG tablet TAKE 1/2 TO 2 TABLETS (25-100 MG TOTAL) BY MOUTH AT BEDTIME AS NEEDED FOR SLEEP 180 tablet 1  . Sennosides (SENOKOT PO) Take by mouth. 2 tablets at night    . temazepam (RESTORIL) 15 MG capsule Take 30 mg by mouth at bedtime as needed.    . terbinafine (LAMISIL) 250 MG tablet TAKE 1 TABLET BY MOUTH EVERY DAY 90 tablet 0  . tiZANidine (ZANAFLEX) 2 MG tablet Take 2 mg by mouth 3 (three) times daily as needed.    . valACYclovir (VALTREX) 1000 MG tablet TAKE 1 TABLET BY  MOUTH EVERY DAY 90 tablet 4   No current facility-administered medications for this visit.    Allergies  Allergen Reactions  . Carbamazepine Swelling    Rash and tongue swelling  . Gabapentin Nausea And Vomiting and Anaphylaxis  . Pineapple Shortness Of Breath and Swelling    Throat swells  . Shellfish-Derived Products Anaphylaxis  . Erythromycin Swelling  . Latex Rash    Hives  . Pregabalin Swelling  . Duloxetine Other (See Comments)    Confusion/dizziness  . Other     Makes throat close  . Pollen Extract Other (See Comments)    Stuffy  Nose

## 2019-02-22 NOTE — Patient Instructions (Addendum)
Thank you for coming in today. Please attend PT.  Recheck in 4 weeks.  If not better return in person.  If better then video visit.   Go to the emergency room if your headache becomes excruciating or you have weakness or numbness or uncontrolled vomiting.   I will be moving to full time Sports Medicine in [place] starting on November 1st.  You will still be able to see me for your Sports Medicine or Orthopedic needs in the [facility] Location. I will still be part of [place].    [place], [place] [address]  [phone]  Telephone (phone line will be functional starting in November).  [phone] Fax [phone] Concussion Line  If you want to stay locally for your Sports Medicine issues Dr. Dianah Field here in [place] will be happy to see you.  Additionally Dr. Clearance Coots at [facility] will be happy to see you for sports medicine issues more locally.   For your primary care needs you are welcome to establish care with Dr. Emeterio Reeve.  We are working quickly to hire more physicians to cover the primary care needs however if you cannot get an appointment with Dr. Sheppard Coil in a timely manner [facility] has locations and openings for primary care services nearby.   [facility] at [facility] [address] . [employer] [place] , [place]: [phone] . Behavioral Medicine: [phone] . Fax: [place] at [employer] [address] . [place], [place]: [phone] . Behavioral Medicine: [phone] . Fax: [phone] . Hours (M-F): 7am - Academic librarian At [facility]. [place], [place]: [phone] . Behavioral Medicine: [phone] . Fax: [phone] . Hours (M-F): 8am - Optician, dispensing at [employer] . [place], [place] Phone: [phone] . Behavioral Medicine: [phone] . Fax: [phone]

## 2019-02-22 NOTE — Telephone Encounter (Signed)
Left patient a voicemail with information below. Let patient know to call us back to schedule an appointment in the office or a virtual visit.  °

## 2019-02-28 ENCOUNTER — Encounter: Payer: Self-pay | Admitting: Rehabilitative and Restorative Service Providers"

## 2019-02-28 ENCOUNTER — Other Ambulatory Visit: Payer: Self-pay

## 2019-02-28 ENCOUNTER — Ambulatory Visit: Payer: Medicare HMO | Admitting: Rehabilitative and Restorative Service Providers"

## 2019-02-28 DIAGNOSIS — R293 Abnormal posture: Secondary | ICD-10-CM

## 2019-02-28 DIAGNOSIS — Z20828 Contact with and (suspected) exposure to other viral communicable diseases: Secondary | ICD-10-CM | POA: Diagnosis not present

## 2019-02-28 DIAGNOSIS — M542 Cervicalgia: Secondary | ICD-10-CM | POA: Diagnosis not present

## 2019-02-28 DIAGNOSIS — R29898 Other symptoms and signs involving the musculoskeletal system: Secondary | ICD-10-CM

## 2019-02-28 DIAGNOSIS — G44201 Tension-type headache, unspecified, intractable: Secondary | ICD-10-CM | POA: Diagnosis not present

## 2019-02-28 DIAGNOSIS — J020 Streptococcal pharyngitis: Secondary | ICD-10-CM | POA: Diagnosis not present

## 2019-02-28 DIAGNOSIS — R51 Headache: Secondary | ICD-10-CM | POA: Diagnosis not present

## 2019-02-28 NOTE — Addendum Note (Signed)
Addended by: Everardo All on: 02/28/2019 12:34 PM   Modules accepted: Orders

## 2019-02-28 NOTE — Therapy (Addendum)
[place], [place], [address] Phone: [phone]   Fax:  [phone]  Physical Therapy Evaluation  Patient Details  Name: [name] MRN: [id number] Date of Birth: 05/26/1966 Referring Provider (PT): Dr [name]    Encounter Date: 02/28/2019  PT End of Session - 02/28/19 1230    Visit Number  1    Number of Visits  18    Date for PT Re-Evaluation  04/11/19    PT Start Time  [id number]    PT Stop Time  1105    PT Time Calculation (min)  50 min    Activity Tolerance  Patient tolerated treatment well       Past Medical History:  Diagnosis Date  . Allergy   . Anxiety   . Arthritis   . Asthma    History of Asthma  . Bipolar disorder ([place])   . Chest pain, atypical 11/30/2012  . Clotting disorder ([place])   . Depression   . Diabetes mellitus without complication ([place])    type 2, pre-diabetic before she lost weight  . GERD (gastroesophageal reflux disease)   . H/O degenerative disc disease    L4-L5, L5-S1  . Heart murmur   . Hyperglycemia    Postoperative hyperglycemia  . Hypertension    hx of  . 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 Obesity 07/14/2012  . OSA (obstructive sleep apnea)    mild  . Pneumonia   . Polycystic disease, ovaries   . Postoperative anemia    2018 - while dieting  hx of  . Reflux   . Sinus tachycardia 07/14/2012   Pt says HR > 100 is consistent  . Sleep apnea    has had two tests, different results- no CPAP used    Past Surgical History:  Procedure Laterality Date  . COLONOSCOPY    . FOOT SURGERY     Right plantar facsiitis scrape  . KNEE SURGERY Bilateral 2007  . LAPAROSCOPIC GASTRIC SLEEVE RESECTION N/A 03/30/2017   Procedure: LAPAROSCOPIC GASTRIC SLEEVE RESECTION, UPPER ENDO;  Surgeon: Greer Pickerel, MD;  Location: [facility];  Service: General;  Laterality: N/A;  . NASAL SINUS SURGERY  2004  . OVARIAN CYST REMOVAL   1998   A cyst on the fallopian tube removed  . SPINAL FUSION    . TONSILLECTOMY  1996    There were no vitals filed for this visit.   Subjective Assessment - 02/28/19 1012    Subjective  Patient reports that she fell forward down 4 steps while holding a flower pot, sustaining injury to neck ~ 2 months ago. Symptoms have increased in the past few weeks. She has some dizziness as well as pain in the base of the neck.    Pertinent History  cervicalgia; migraine headaches; LBP - with fusion L3- S1 in 2005; anxiety; depression; bilat knee pain with scope; 2 surgeries Lt foot    Diagnostic tests  no xrays or scans post fall    Patient Stated Goals  get rid of the neck pain and HA    Currently in Pain?  Yes    Pain Score  7     Pain Location  Neck    Pain Orientation  Posterior;Left;Right    Pain Descriptors / Indicators  Sharp    Pain Type  Acute pain    Pain Radiating Towards  up into head  Pain Onset  More than a month ago    Pain Frequency  Constant    Aggravating Factors   sunlight increasing HA    Pain Relieving Factors  no comfortable position for neck and headache         [facility] PT Assessment - 02/28/19 0001      Assessment   Medical Diagnosis  Cervicalgia; migraine    Referring Provider (PT)  Dr [name]     Onset Date/Surgical Date  12/20/18    Hand Dominance  Right    Next MD Visit  PRN     Prior Therapy  yes here for neck pain and HA       Precautions   Precautions  None      Balance Screen   Has the patient fallen in the past 6 months  Yes    How many times?  1    Has the patient had a decrease in activity level because of a fear of falling?   No    Is the patient reluctant to leave their home because of a fear of falling?   No      Home Film/video editor residence    Living Arrangements  Spouse/significant other    [place] to enter    Entrance Stairs-Number of Steps  3    Entrance Stairs-Rails  Can reach both       Prior Function   Level of Independence  Independent    Vocation  Other (comment)   disability    Leisure  minimal household chores; cares for dog; sedentary       Observation/Other Assessments   Focus on Therapeutic Outcomes (FOTO)   53% limitation       Sensation   Additional Comments  WFL's       Posture/Postural Control   Posture Comments  head forward; shoudlers rounded and elevated; scapulae abducted and rotated along the thoracic wall       AROM   Right/Left Shoulder  --   WFL's bilat   Cervical Flexion  47    Cervical Extension  40    Cervical - Right Side Bend  29    Cervical - Left Side Bend  22    Cervical - Right Rotation  61    Cervical - Left Rotation  46      Strength   Overall Strength Comments  grossly WFL's UE's       Palpation   Spinal mobility  hypomobility thoracic and cervical spine with CPA mobs     Palpation comment  muscular tightness Lt > Rt pecs; ant/lat/posterior cervical musculature; upper traps; leveator; teres                 Objective measurements completed on examination: See above findings.      [facility] Adult PT Treatment/Exercise - 02/28/19 0001      Neuro Re-ed    Neuro Re-ed Details   working on posture and alignment       Shoulder Exercises: Standing   Other Standing Exercises  axial extension 10 sec x 5; scap squeeze 10 sec x 5; L's x 10; W's x 10 with noodle       Shoulder Exercises: Stretch   Other Shoulder Stretches  3 way doorway stretch 30 sec x 3 reps ea position       Moist Heat Therapy   Number Minutes Moist Heat  15 Minutes  Moist Heat Location  Cervical;Shoulder      Acupuncturist Location  bilat cervical and upper trap     Electrical Stimulation Action  IFC    Electrical Stimulation Parameters  to tolerance    Electrical Stimulation Goals  Pain;Tone             PT Education - 02/28/19 1034    Education Details  HEP POC    Person(s) Educated  Patient     Methods  Explanation;Demonstration;Tactile cues;Verbal cues;Handout    Comprehension  Verbalized understanding;Returned demonstration;Verbal cues required;Tactile cues required          PT Long Term Goals - 02/28/19 1049      PT LONG TERM GOAL #1   Time  6    Period  Weeks    Status  New    Target Date  04/11/19      PT LONG TERM GOAL #2   Title  Increased AROM cervical ROM to WFL's in all planes    Time  6    Period  Weeks    Status  New    Target Date  04/11/19      PT LONG TERM GOAL #3   Title  Decrease cervical pain and headaches to 3/10 to 5/10 at most for functional activities and ADL's    Time  6    Period  Weeks    Status  New    Target Date  04/11/19      PT LONG TERM GOAL #4   Title  Independent in HEP    Time  6    Period  Weeks    Status  New    Target Date  04/11/19      PT LONG TERM GOAL #5   Title  Improve FOTO to </= 40% limitaion    Time  6    Period  Weeks    Status  New    Target Date  04/11/19             Plan - 02/28/19 1034    Clinical Impression Statement  Patient presents with cervical pain and headache x ~ 2 months following a fall forward down stairs while holding a flower pot. Patient has a history of cervical pain and migraines but reports that she was symptoms free prior to fall. Patient has poor posture and alignment limited cervical ROM; muscular tightness to palpation through the cervical and shoulder area; pain limiting functional activity level. patient will benefit from PT to address problems identified.    Stability/Clinical Decision Making  Stable/Uncomplicated    Clinical Decision Making  Low    Rehab Potential  Good    PT Frequency  3x / week    PT Duration  6 weeks    PT Treatment/Interventions  Patient/family education;ADLs/Self Care Home Management;Cryotherapy;Electrical Stimulation;Iontophoresis 4mg /ml Dexamethasone;Moist Heat;Ultrasound;Traction;Manual techniques;Dry needling;Neuromuscular re-education;Therapeutic  activities;Therapeutic exercise    PT Next Visit Plan  review HEP; porgress with exercises; trial of DN/manual work as indicated; modalities as indicated    PT Home Exercise Plan  [email or web address]    Consulted and Agree with Plan of Care  Patient       Patient will benefit from skilled therapeutic intervention in order to improve the following deficits and impairments:  Pain, Increased fascial restricitons, Increased muscle spasms, Hypomobility, Decreased mobility, Decreased range of motion, Decreased activity tolerance  Visit Diagnosis: Cervicalgia - Plan: PT plan of care cert/re-cert, CANCELED: PT plan of care cert/re-cert  Acute intractable tension-type headache - Plan: PT plan of care cert/re-cert, CANCELED: PT plan of care cert/re-cert  Other symptoms and signs involving the musculoskeletal system - Plan: PT plan of care cert/re-cert, CANCELED: PT plan of care cert/re-cert  Abnormal posture - Plan: PT plan of care cert/re-cert, CANCELED: PT plan of care cert/re-cert     Problem List Patient Active Problem List   Diagnosis Date Noted  . Positive colorectal cancer screening using Cologuard test 03/02/2018  . Leukocytosis 01/26/2018  . Onychomycosis of great toe 12/22/2017  . Family history of DVT 03/20/2017  . Arthritis of right knee 05/13/2016  . Dyslipidemia 03/04/2016  . Right knee pain 03/03/2016  . Pre-diabetes 11/01/2015  . Allergic rhinitis 05/28/2015  . BMI 33.0-33.9,adult 05/24/2013  . Polycystic ovary disease 07/14/2012  . Obstructive sleep apnea (adult) (pediatric) 02/04/2012  . Perimenopausal 11/10/2011  . Disturbance in sleep behavior 09/16/2011  . Asthma 05/23/2011  . Backache 05/23/2011  . 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 Dysphagia 05/23/2011  . Esophageal reflux 05/23/2011    Harnoor Reta Nilda Simmer PT, MPH  02/28/2019, 12:33 PM  [facility] [place], [place],  [address] Phone: [phone]   Fax:  [phone]  Name: TERRIYAH LINNEBUR MRN: [id number] Date of Birth: 1965/07/28

## 2019-02-28 NOTE — Patient Instructions (Signed)
Access Code: [email or web address]  URL: [email or web address]  Date: 02/28/2019  Prepared by: Gillermo Murdoch   Exercises  Seated Cervical Retraction - 10 reps - 1 sets - 3x daily - 7x weekly  Standing Scapular Retraction - 10 reps - 1 sets - 10 hold - 3x daily - 7x weekly  Shoulder External Rotation and Scapular Retraction - 10 reps - 1 sets - hold - 3x daily - 7x weekly  Standing Scapular Retraction in Abduction - 10 reps - 1 sets - 3x daily - 7x weekly  Doorway Pec Stretch at 60 Degrees Abduction - 3 reps - 1 sets - 3x daily - 7x weekly  Doorway Pec Stretch at 90 Degrees Abduction - 3 reps - 1 sets - 30 seconds hold - 3x daily - 7x weekly  Doorway Pec Stretch at 120 Degrees Abduction - 3 reps - 1 sets - 30 second hold hold - 3x daily - 7x weekly

## 2019-03-02 ENCOUNTER — Encounter: Payer: Self-pay | Admitting: Rehabilitative and Restorative Service Providers"

## 2019-03-02 ENCOUNTER — Other Ambulatory Visit: Payer: Self-pay

## 2019-03-02 ENCOUNTER — Ambulatory Visit: Payer: Medicare HMO | Admitting: Rehabilitative and Restorative Service Providers"

## 2019-03-02 DIAGNOSIS — M542 Cervicalgia: Secondary | ICD-10-CM

## 2019-03-02 DIAGNOSIS — R29898 Other symptoms and signs involving the musculoskeletal system: Secondary | ICD-10-CM | POA: Diagnosis not present

## 2019-03-02 DIAGNOSIS — G44201 Tension-type headache, unspecified, intractable: Secondary | ICD-10-CM | POA: Diagnosis not present

## 2019-03-02 DIAGNOSIS — R293 Abnormal posture: Secondary | ICD-10-CM | POA: Diagnosis not present

## 2019-03-02 NOTE — Therapy (Addendum)
[place], [place], [address] Phone: [phone]   Fax:  [phone]  Physical Therapy Treatment  Patient Details  Name: AVIANAH PELLMAN MRN: [id number] Date of Birth: [birth year] Referring Provider (PT): Dr [name]    Encounter Date: 03/02/2019  PT End of Session - 03/02/19 1431    Visit Number  2    Number of Visits  18    Date for PT Re-Evaluation  04/11/19    PT Start Time  0370    PT Stop Time  1518    PT Time Calculation (min)  47 min    Activity Tolerance  Patient tolerated treatment well       Past Medical History:  Diagnosis Date  . Allergy   . Anxiety   . Arthritis   . Asthma    History of Asthma  . Bipolar disorder ([place])   . Chest pain, atypical 11/30/2012  . Clotting disorder ([place])   . Depression   . Diabetes mellitus without complication ([place])    type 2, pre-diabetic before she lost weight  . GERD (gastroesophageal reflux disease)   . H/O degenerative disc disease    L4-L5, L5-S1  . Heart murmur   . Hyperglycemia    Postoperative hyperglycemia  . Hypertension    hx of  . 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 Obesity 07/14/2012  . OSA (obstructive sleep apnea)    mild  . Pneumonia   . Polycystic disease, ovaries   . Postoperative anemia    2018 - while dieting  hx of  . Reflux   . Sinus tachycardia 07/14/2012   Pt says HR > 100 is consistent  . Sleep apnea    has had two tests, different results- no CPAP used    Past Surgical History:  Procedure Laterality Date  . COLONOSCOPY    . FOOT SURGERY     Right plantar facsiitis scrape  . KNEE SURGERY Bilateral 2007  . LAPAROSCOPIC GASTRIC SLEEVE RESECTION N/A 03/30/2017   Procedure: LAPAROSCOPIC GASTRIC SLEEVE RESECTION, UPPER ENDO;  Surgeon: Greer Pickerel, MD;  Location: [facility];  Service: General;  Laterality: N/A;  . NASAL SINUS SURGERY  2004  . OVARIAN CYST REMOVAL   1998   A cyst on the fallopian tube removed  . SPINAL FUSION    . TONSILLECTOMY  1996    There were no vitals filed for this visit.  Subjective Assessment - 03/02/19 1431    Subjective  Patient reports that she has been working on her exercises at home. Feeling better. No HA today.    Currently in Pain?  No/denies                       [facility] Adult PT Treatment/Exercise - 03/02/19 0001      Shoulder Exercises: Standing   Extension  Strengthening;Both;10 reps;Theraband    Theraband Level (Shoulder Extension)  Level 2 (Red)    Row  Strengthening;Both;10 reps;Theraband    Theraband Level (Shoulder Row)  Level 2 (Red)    Retraction  Strengthening;Both;10 reps;Theraband    Theraband Level (Shoulder Retraction)  Level 2 (Red)   with noodle along spine    Other Standing Exercises  axial extension 10 sec x 5; scap squeeze 10 sec x 5; L's x 10; W's x 10 with noodle       Shoulder Exercises: Stretch  Other Shoulder Stretches  3 way doorway stretch 30 sec x 3 reps ea position       Moist Heat Therapy   Number Minutes Moist Heat  14 Minutes    Moist Heat Location  Cervical;Shoulder      Electrical Stimulation   Electrical Stimulation Location  bilat cervical and upper trap     Electrical Stimulation Action  IFC    Electrical Stimulation Parameters  to tolerance     Electrical Stimulation Goals  Pain;Tone      Manual Therapy   Manual therapy comments  pt supine     Soft tissue mobilization  deep tissue work through the posterior/lateral cervical musculature; upper trap     Myofascial Release  anterior chest/pecs              PT Education - 03/02/19 1445    Education Details  HEP    Person(s) Educated  Patient    Methods  Explanation;Demonstration;Tactile cues;Verbal cues;Handout    Comprehension  Verbalized understanding;Returned demonstration;Verbal cues required;Tactile cues required          PT Long Term Goals - 02/28/19 1049      PT LONG TERM  GOAL #1   Time  6    Period  Weeks    Status  New    Target Date  04/11/19      PT LONG TERM GOAL #2   Title  Increased AROM cervical ROM to WFL's in all planes    Time  6    Period  Weeks    Status  New    Target Date  04/11/19      PT LONG TERM GOAL #3   Title  Decrease cervical pain and headaches to 3/10 to 5/10 at most for functional activities and ADL's    Time  6    Period  Weeks    Status  New    Target Date  04/11/19      PT LONG TERM GOAL #4   Title  Independent in HEP    Time  6    Period  Weeks    Status  New    Target Date  04/11/19      PT LONG TERM GOAL #5   Title  Improve FOTO to </= 40% limitaion    Time  6    Period  Weeks    Status  New    Target Date  04/11/19            Plan - 03/02/19 1431    Clinical Impression Statement  Good response to initial treatment and HEP Added posterior shoulder girdle strengthening today without difficulty.    Stability/Clinical Decision Making  Stable/Uncomplicated    Rehab Potential  Good    PT Frequency  3x / week    PT Duration  6 weeks    PT Treatment/Interventions  Patient/family education;ADLs/Self Care Home Management;Cryotherapy;Electrical Stimulation;Iontophoresis 85m/ml Dexamethasone;Moist Heat;Ultrasound;Traction;Manual techniques;Dry needling;Neuromuscular re-education;Therapeutic activities;Therapeutic exercise    PT Next Visit Plan  review HEP; porgress with exercises; trial of DN/manual work as indicated; modalities as indicated    PT Home Exercise Plan  [email or web address]    Consulted and Agree with Plan of Care  Patient       Patient will benefit from skilled therapeutic intervention in order to improve the following deficits and impairments:  Pain, Increased fascial restricitons, Increased muscle spasms, Hypomobility, Decreased mobility, Decreased range of motion, Decreased activity tolerance  Visit Diagnosis: Cervicalgia  Acute intractable  tension-type headache  Other symptoms and signs involving  the musculoskeletal system  Abnormal posture     Problem List Patient Active Problem List   Diagnosis Date Noted  . Positive colorectal cancer screening using Cologuard test 03/02/2018  . Leukocytosis 01/26/2018  . Onychomycosis of great toe 12/22/2017  . Family history of DVT 03/20/2017  . Arthritis of right knee 05/13/2016  . Dyslipidemia 03/04/2016  . Right knee pain 03/03/2016  . Pre-diabetes 11/01/2015  . Allergic rhinitis 05/28/2015  . BMI 33.0-33.9,adult 05/24/2013  . Polycystic ovary disease 07/14/2012  . Obstructive sleep apnea (adult) (pediatric) 02/04/2012  . Perimenopausal 11/10/2011  . Disturbance in sleep behavior 09/16/2011  . Asthma 05/23/2011  . Backache 05/23/2011  . 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 Dysphagia 05/23/2011  . Esophageal reflux 05/23/2011    Sheyanne Munley Nilda Simmer PT, MPH  03/02/2019, 3:07 PM  [facility] [place], [place], [address] Phone: [phone]   Fax:  [phone]  Name: YAZMYN VALBUENA MRN: [id number] Date of Birth: [birth year]  PHYSICAL THERAPY DISCHARGE SUMMARY  Visits from Start of Care: 2  Current functional level related to goals / functional outcomes: See progress note for discharge status    Remaining deficits: Unknown    Education / Equipment: HEP Plan: Patient agrees to discharge.  Patient goals were partially met. Patient is being discharged due to not returning since the last visit.  ?????     Wandell Scullion P. Helene Kelp PT, MPH 08/26/19 10:57 AM

## 2019-03-02 NOTE — Patient Instructions (Signed)
Access Code: [email or web address]  URL: [email or web address]  Date: 03/02/2019  Prepared by: Gillermo Murdoch   Exercises  Seated Cervical Retraction - 10 reps - 1 sets - 3x daily - 7x weekly  Standing Scapular Retraction - 10 reps - 1 sets - 10 hold - 3x daily - 7x weekly  Shoulder External Rotation and Scapular Retraction - 10 reps - 1 sets - hold - 3x daily - 7x weekly  Standing Scapular Retraction in Abduction - 10 reps - 1 sets - 3x daily - 7x weekly  Doorway Pec Stretch at 60 Degrees Abduction - 3 reps - 1 sets - 3x daily - 7x weekly  Doorway Pec Stretch at 90 Degrees Abduction - 3 reps - 1 sets - 30 seconds hold - 3x daily - 7x weekly  Doorway Pec Stretch at 120 Degrees Abduction - 3 reps - 1 sets - 30 second hold hold - 3x daily - 7x weekly  Shoulder External Rotation and Scapular Retraction with Resistance - 10 reps - 3 sets - 2x daily - 7x weekly  Scapular Retraction with Resistance - 10 reps - 3 sets - 2x daily - 7x weekly  Scapular Retraction with Resistance Advanced - 10 reps - 3 sets - 2x daily - 7x weekly

## 2019-03-04 ENCOUNTER — Encounter: Payer: Medicare HMO | Admitting: Rehabilitative and Restorative Service Providers"

## 2019-03-20 ENCOUNTER — Other Ambulatory Visit: Payer: Self-pay | Admitting: Family Medicine

## 2019-03-21 DIAGNOSIS — K5900 Constipation, unspecified: Secondary | ICD-10-CM | POA: Diagnosis not present

## 2019-03-21 DIAGNOSIS — G894 Chronic pain syndrome: Secondary | ICD-10-CM | POA: Diagnosis not present

## 2019-03-21 DIAGNOSIS — M62830 Muscle spasm of back: Secondary | ICD-10-CM | POA: Diagnosis not present

## 2019-03-21 DIAGNOSIS — M5481 Occipital neuralgia: Secondary | ICD-10-CM | POA: Diagnosis not present

## 2019-03-21 DIAGNOSIS — M47816 Spondylosis without myelopathy or radiculopathy, lumbar region: Secondary | ICD-10-CM | POA: Diagnosis not present

## 2019-03-28 DIAGNOSIS — M461 Sacroiliitis, not elsewhere classified: Secondary | ICD-10-CM | POA: Diagnosis not present

## 2019-04-22 ENCOUNTER — Other Ambulatory Visit: Payer: Self-pay | Admitting: Family Medicine

## 2019-04-25 ENCOUNTER — Other Ambulatory Visit: Payer: Self-pay | Admitting: Family Medicine

## 2019-04-25 DIAGNOSIS — M62830 Muscle spasm of back: Secondary | ICD-10-CM | POA: Diagnosis not present

## 2019-04-25 DIAGNOSIS — G894 Chronic pain syndrome: Secondary | ICD-10-CM | POA: Diagnosis not present

## 2019-04-25 DIAGNOSIS — M47816 Spondylosis without myelopathy or radiculopathy, lumbar region: Secondary | ICD-10-CM | POA: Diagnosis not present

## 2019-04-25 DIAGNOSIS — K5900 Constipation, unspecified: Secondary | ICD-10-CM | POA: Diagnosis not present

## 2019-04-26 DIAGNOSIS — R69 Illness, unspecified: Secondary | ICD-10-CM | POA: Diagnosis not present

## 2019-04-27 NOTE — Telephone Encounter (Signed)
Need to recheck liver function before we can refill terbinafine.  I can order labs if patient will go get them.

## 2019-04-27 NOTE — Telephone Encounter (Signed)
Left message for patient to call back  

## 2019-05-14 ENCOUNTER — Other Ambulatory Visit: Payer: Self-pay | Admitting: Family Medicine

## 2019-05-16 ENCOUNTER — Ambulatory Visit (INDEPENDENT_AMBULATORY_CARE_PROVIDER_SITE_OTHER): Payer: Medicare HMO | Admitting: Obstetrics & Gynecology

## 2019-05-16 ENCOUNTER — Encounter: Payer: Self-pay | Admitting: Obstetrics & Gynecology

## 2019-05-16 ENCOUNTER — Other Ambulatory Visit (HOSPITAL_COMMUNITY)
Admission: RE | Admit: 2019-05-16 | Discharge: 2019-05-16 | Disposition: A | Discharge status: Home / Self Care | Payer: Medicare HMO | Source: Ambulatory Visit | Attending: Obstetrics & Gynecology | Admitting: Obstetrics & Gynecology

## 2019-05-16 ENCOUNTER — Other Ambulatory Visit: Payer: Self-pay

## 2019-05-16 VITALS — BP 125/83 | HR 83 | Temp 98.7°F | Resp 16 | Ht 67.0 in | Wt 206.0 lb

## 2019-05-16 DIAGNOSIS — Z1151 Encounter for screening for human papillomavirus (HPV): Secondary | ICD-10-CM | POA: Insufficient documentation

## 2019-05-16 DIAGNOSIS — Z01419 Encounter for gynecological examination (general) (routine) without abnormal findings: Secondary | ICD-10-CM | POA: Insufficient documentation

## 2019-05-16 DIAGNOSIS — Z780 Asymptomatic menopausal state: Secondary | ICD-10-CM | POA: Insufficient documentation

## 2019-05-16 DIAGNOSIS — R69 Illness, unspecified: Secondary | ICD-10-CM | POA: Diagnosis not present

## 2019-05-16 DIAGNOSIS — Z23 Encounter for immunization: Secondary | ICD-10-CM

## 2019-05-16 DIAGNOSIS — E785 Hyperlipidemia, unspecified: Secondary | ICD-10-CM | POA: Diagnosis not present

## 2019-05-16 DIAGNOSIS — R7303 Prediabetes: Secondary | ICD-10-CM | POA: Diagnosis not present

## 2019-05-16 MED ORDER — VALACYCLOVIR HCL 1 G PO TABS: ORAL_TABLET | ORAL | 4 refills | Status: DC

## 2019-05-16 MED ORDER — LIDOCAINE HCL URETHRAL/MUCOSAL 2 % EX GEL: 1.0000 "application " | CUTANEOUS | 2 refills | Status: DC | PRN

## 2019-05-16 MED ORDER — ESTRADIOL 1 MG PO TABS: 1.0000 mg | ORAL_TABLET | Freq: Every day | ORAL | 5 refills | Status: DC

## 2019-05-16 MED ORDER — MEDROXYPROGESTERONE ACETATE 2.5 MG PO TABS: 2.5000 mg | ORAL_TABLET | Freq: Every day | ORAL | 5 refills | Status: AC

## 2019-05-16 NOTE — Progress Notes (Signed)
Subjective:    [name] is a 53 y.o. female who presents for an annual exam. She has a sore area in her vagina that causes pain with sex, for about 6 months. This is different than her hsv pain. She needs refills today. She also reports no libido with a husband with a big sexual appetite.  The patient is sexually active. GYN screening history: last pap: was normal. The patient wears seatbelts: yes. The patient participates in regular exercise: yes. Has the patient ever been transfused or tattooed?: no. The patient reports that there is not domestic violence in her life.   Menstrual History: OB History    Gravida  1   Para      Term      Preterm      AB  1   Living        SAB      TAB      Ectopic      Multiple      Live Births              Menarche age: 60 Patient's last menstrual period was 08/22/2016 (approximate).    The following portions of the patient's history were reviewed and updated as appropriate: allergies, current medications, past family history, past medical history, past social history, past surgical history and problem list.  Review of Systems Pertinent items are noted in HPI.   married for 2 years Happy with HRT She had a colonoscopy at 53 yo She has a therapist/psychiatrist On disability due to back pain  Objective:    BP 125/83   Pulse 83   Temp 98.7 F (37.1 C) (Oral)   Resp 16   Ht 5\' 7"  (1.702 m)   Wt 206 lb (93.4 kg)   LMP 08/22/2016 (Approximate)   BMI 32.26 kg/m   General Appearance:    Alert, cooperative, no distress, appears stated age  Head:    Normocephalic, without obvious abnormality, atraumatic  Eyes:    PERRL, conjunctiva/corneas clear, EOM's intact, fundi    benign, both eyes  Ears:    Normal TM's and external ear canals, both ears  Nose:   Nares normal, septum midline, mucosa normal, no drainage    or sinus tenderness  Throat:   Lips, mucosa, and tongue normal; teeth and gums normal  Neck:   Supple,  symmetrical, trachea midline, no adenopathy;    thyroid:  no enlargement/tenderness/nodules; no carotid   bruit or JVD  Back:     Symmetric, no curvature, ROM normal, no CVA tenderness  Lungs:     Clear to auscultation bilaterally, respirations unlabored  Chest Wall:    No tenderness or deformity   Heart:    Regular rate and rhythm, S1 and S2 normal, no murmur, rub   or gallop  Breast Exam:    No tenderness, masses, or nipple abnormality  Abdomen:     Soft, non-tender, bowel sounds active all four quadrants,    no masses, no organomegaly  Genitalia:    Normal female without lesion, discharge or tenderness, mild atropju     Extremities:   Extremities normal, atraumatic, no cyanosis or edema  Pulses:   2+ and symmetric all extremities  Skin:   Skin color, texture, turgor normal, no rashes or lesions  Lymph nodes:   Cervical, supraclavicular, and axillary nodes normal  Neurologic:   CNII-XII intact, normal strength, sensation and reflexes    throughout  .    Assessment:  Healthy female exam.   Dyspareunia Decreased libido   Plan:     Mammogram. Thin prep Pap smear. with cotesting TDAP and flu vaccines today Lidocaine jelly prior to sex Test ointment 3 nights per week Fasting labs today

## 2019-05-17 LAB — LIPID PANEL
Cholesterol: 220 mg/dL — ABNORMAL HIGH (ref <200)
HDL: 61 mg/dL (ref >=50)
LDL Cholesterol (Calc): 143 mg/dL (calc) — ABNORMAL HIGH
Non-HDL Cholesterol (Calc): 159 mg/dL (calc) — ABNORMAL HIGH (ref <130)
Total CHOL/HDL Ratio: 3.6 (calc) (ref <5.0)
Triglycerides: 65 mg/dL (ref <150)

## 2019-05-17 LAB — CBC
HCT: 40.1 % (ref 35.0–45.0)
Hemoglobin: 12.9 g/dL (ref 11.7–15.5)
MCH: 27.7 pg (ref 27.0–33.0)
MCHC: 32.2 g/dL (ref 32.0–36.0)
MCV: 86.1 fL (ref 80.0–100.0)
MPV: 10.5 fL (ref 7.5–12.5)
Platelets: 312 10*3/uL (ref 140–400)
RBC: 4.66 10*6/uL (ref 3.80–5.10)
RDW: 13.3 % (ref 11.0–15.0)
WBC: 7.5 10*3/uL (ref 3.8–10.8)

## 2019-05-17 LAB — COMPREHENSIVE METABOLIC PANEL
AG Ratio: 1.8 (calc) (ref 1.0–2.5)
ALT: 7 U/L (ref 6–29)
AST: 14 U/L (ref 10–35)
Albumin: 4.6 g/dL (ref 3.6–5.1)
Alkaline phosphatase (APISO): 71 U/L (ref 37–153)
BUN: 7 mg/dL (ref 7–25)
CO2: 31 mmol/L (ref 20–32)
Calcium: 10 mg/dL (ref 8.6–10.4)
Chloride: 104 mmol/L (ref 98–110)
Creat: 0.93 mg/dL (ref 0.50–1.05)
Globulin: 2.6 g/dL (calc) (ref 1.9–3.7)
Glucose, Bld: 85 mg/dL (ref 65–99)
Potassium: 5 mmol/L (ref 3.5–5.3)
Sodium: 143 mmol/L (ref 135–146)
Total Bilirubin: 0.4 mg/dL (ref 0.2–1.2)
Total Protein: 7.2 g/dL (ref 6.1–8.1)

## 2019-05-17 LAB — HEMOGLOBIN A1C
Hgb A1c MFr Bld: 5.8 % of total Hgb — ABNORMAL HIGH (ref <5.7)
Mean Plasma Glucose: 120 (calc)
eAG (mmol/L): 6.6 (calc)

## 2019-05-17 LAB — TSH: TSH: 1.1 mIU/L

## 2019-05-17 LAB — VITAMIN D 25 HYDROXY (VIT D DEFICIENCY, FRACTURES): Vit D, 25-Hydroxy: 36 ng/mL (ref 30–100)

## 2019-05-18 LAB — CYTOLOGY - PAP
Adequacy: ABSENT
Comment: NEGATIVE
Diagnosis: NEGATIVE
High risk HPV: NEGATIVE

## 2019-05-23 DIAGNOSIS — M47816 Spondylosis without myelopathy or radiculopathy, lumbar region: Secondary | ICD-10-CM | POA: Diagnosis not present

## 2019-05-24 ENCOUNTER — Other Ambulatory Visit: Payer: Self-pay | Admitting: Sports Medicine

## 2019-06-06 DIAGNOSIS — Z20828 Contact with and (suspected) exposure to other viral communicable diseases: Secondary | ICD-10-CM | POA: Diagnosis not present

## 2019-06-06 DIAGNOSIS — J069 Acute upper respiratory infection, unspecified: Secondary | ICD-10-CM | POA: Diagnosis not present

## 2019-06-06 DIAGNOSIS — J45909 Unspecified asthma, uncomplicated: Secondary | ICD-10-CM | POA: Diagnosis not present

## 2019-06-06 DIAGNOSIS — J209 Acute bronchitis, unspecified: Secondary | ICD-10-CM | POA: Diagnosis not present

## 2019-06-09 ENCOUNTER — Other Ambulatory Visit: Payer: Self-pay | Admitting: Family Medicine

## 2019-06-10 DIAGNOSIS — R69 Illness, unspecified: Secondary | ICD-10-CM | POA: Diagnosis not present

## 2019-06-21 DIAGNOSIS — M62830 Muscle spasm of back: Secondary | ICD-10-CM | POA: Diagnosis not present

## 2019-06-21 DIAGNOSIS — K5900 Constipation, unspecified: Secondary | ICD-10-CM | POA: Diagnosis not present

## 2019-06-21 DIAGNOSIS — M47816 Spondylosis without myelopathy or radiculopathy, lumbar region: Secondary | ICD-10-CM | POA: Diagnosis not present

## 2019-06-21 DIAGNOSIS — R69 Illness, unspecified: Secondary | ICD-10-CM | POA: Diagnosis not present

## 2019-06-21 DIAGNOSIS — Z79891 Long term (current) use of opiate analgesic: Secondary | ICD-10-CM | POA: Diagnosis not present

## 2019-06-21 DIAGNOSIS — G894 Chronic pain syndrome: Secondary | ICD-10-CM | POA: Diagnosis not present

## 2019-06-22 ENCOUNTER — Ambulatory Visit (INDEPENDENT_AMBULATORY_CARE_PROVIDER_SITE_OTHER): Payer: Medicare HMO

## 2019-06-22 ENCOUNTER — Other Ambulatory Visit: Payer: Self-pay

## 2019-06-22 DIAGNOSIS — Z01419 Encounter for gynecological examination (general) (routine) without abnormal findings: Secondary | ICD-10-CM

## 2019-06-22 DIAGNOSIS — Z1231 Encounter for screening mammogram for malignant neoplasm of breast: Secondary | ICD-10-CM | POA: Diagnosis not present

## 2019-07-03 DIAGNOSIS — R0602 Shortness of breath: Secondary | ICD-10-CM | POA: Diagnosis not present

## 2019-07-03 DIAGNOSIS — R509 Fever, unspecified: Secondary | ICD-10-CM | POA: Diagnosis not present

## 2019-07-03 DIAGNOSIS — Z20828 Contact with and (suspected) exposure to other viral communicable diseases: Secondary | ICD-10-CM | POA: Diagnosis not present

## 2019-07-03 DIAGNOSIS — R05 Cough: Secondary | ICD-10-CM | POA: Diagnosis not present

## 2019-07-19 DIAGNOSIS — K5900 Constipation, unspecified: Secondary | ICD-10-CM | POA: Diagnosis not present

## 2019-07-19 DIAGNOSIS — G894 Chronic pain syndrome: Secondary | ICD-10-CM | POA: Diagnosis not present

## 2019-07-19 DIAGNOSIS — M62830 Muscle spasm of back: Secondary | ICD-10-CM | POA: Diagnosis not present

## 2019-07-19 DIAGNOSIS — M47816 Spondylosis without myelopathy or radiculopathy, lumbar region: Secondary | ICD-10-CM | POA: Diagnosis not present

## 2019-08-04 DIAGNOSIS — R69 Illness, unspecified: Secondary | ICD-10-CM | POA: Diagnosis not present

## 2019-08-16 DIAGNOSIS — G894 Chronic pain syndrome: Secondary | ICD-10-CM | POA: Diagnosis not present

## 2019-08-16 DIAGNOSIS — M791 Myalgia, unspecified site: Secondary | ICD-10-CM | POA: Diagnosis not present

## 2019-08-16 DIAGNOSIS — M47816 Spondylosis without myelopathy or radiculopathy, lumbar region: Secondary | ICD-10-CM | POA: Diagnosis not present

## 2019-08-16 DIAGNOSIS — M62830 Muscle spasm of back: Secondary | ICD-10-CM | POA: Diagnosis not present

## 2019-08-16 DIAGNOSIS — K5900 Constipation, unspecified: Secondary | ICD-10-CM | POA: Diagnosis not present

## 2019-08-24 DIAGNOSIS — R69 Illness, unspecified: Secondary | ICD-10-CM | POA: Diagnosis not present

## 2019-08-31 ENCOUNTER — Other Ambulatory Visit: Payer: Self-pay | Admitting: Physical Medicine and Rehabilitation

## 2019-08-31 DIAGNOSIS — M5416 Radiculopathy, lumbar region: Secondary | ICD-10-CM

## 2019-09-08 DIAGNOSIS — Z0000 Encounter for general adult medical examination without abnormal findings: Secondary | ICD-10-CM | POA: Diagnosis not present

## 2019-09-08 DIAGNOSIS — J4540 Moderate persistent asthma, uncomplicated: Secondary | ICD-10-CM | POA: Diagnosis not present

## 2019-09-08 DIAGNOSIS — E785 Hyperlipidemia, unspecified: Secondary | ICD-10-CM | POA: Diagnosis not present

## 2019-09-08 DIAGNOSIS — G8929 Other chronic pain: Secondary | ICD-10-CM | POA: Diagnosis not present

## 2019-09-08 DIAGNOSIS — I10 Essential (primary) hypertension: Secondary | ICD-10-CM | POA: Diagnosis not present

## 2019-09-08 DIAGNOSIS — E669 Obesity, unspecified: Secondary | ICD-10-CM | POA: Diagnosis not present

## 2019-09-08 DIAGNOSIS — K1120 Sialoadenitis, unspecified: Secondary | ICD-10-CM | POA: Diagnosis not present

## 2019-09-08 DIAGNOSIS — M545 Low back pain: Secondary | ICD-10-CM | POA: Diagnosis not present

## 2019-09-09 DIAGNOSIS — R69 Illness, unspecified: Secondary | ICD-10-CM | POA: Diagnosis not present

## 2019-09-09 DIAGNOSIS — E785 Hyperlipidemia, unspecified: Secondary | ICD-10-CM | POA: Diagnosis not present

## 2019-09-20 DIAGNOSIS — R69 Illness, unspecified: Secondary | ICD-10-CM | POA: Diagnosis not present

## 2019-09-24 ENCOUNTER — Ambulatory Visit
Admission: RE | Admit: 2019-09-24 | Discharge: 2019-09-24 | Disposition: A | Discharge status: Home / Self Care | Payer: Medicare HMO | Source: Ambulatory Visit | Attending: Physical Medicine and Rehabilitation | Admitting: Physical Medicine and Rehabilitation

## 2019-09-24 ENCOUNTER — Other Ambulatory Visit: Payer: Self-pay

## 2019-09-24 DIAGNOSIS — M5126 Other intervertebral disc displacement, lumbar region: Secondary | ICD-10-CM | POA: Diagnosis not present

## 2019-09-24 DIAGNOSIS — M5416 Radiculopathy, lumbar region: Secondary | ICD-10-CM

## 2019-09-24 DIAGNOSIS — M48061 Spinal stenosis, lumbar region without neurogenic claudication: Secondary | ICD-10-CM | POA: Diagnosis not present

## 2019-09-24 MED ORDER — GADOBENATE DIMEGLUMINE 529 MG/ML IV SOLN
18.0000 mL | Freq: Once | INTRAVENOUS | Status: AC | PRN
  Administered 2019-09-24: 18 mL via INTRAVENOUS

## 2019-09-26 DIAGNOSIS — K5900 Constipation, unspecified: Secondary | ICD-10-CM | POA: Diagnosis not present

## 2019-09-26 DIAGNOSIS — M62830 Muscle spasm of back: Secondary | ICD-10-CM | POA: Diagnosis not present

## 2019-09-26 DIAGNOSIS — M47816 Spondylosis without myelopathy or radiculopathy, lumbar region: Secondary | ICD-10-CM | POA: Diagnosis not present

## 2019-09-26 DIAGNOSIS — G894 Chronic pain syndrome: Secondary | ICD-10-CM | POA: Diagnosis not present

## 2019-09-29 DIAGNOSIS — M47816 Spondylosis without myelopathy or radiculopathy, lumbar region: Secondary | ICD-10-CM | POA: Diagnosis not present

## 2019-10-05 DIAGNOSIS — M545 Low back pain: Secondary | ICD-10-CM | POA: Diagnosis not present

## 2019-10-05 DIAGNOSIS — M5416 Radiculopathy, lumbar region: Secondary | ICD-10-CM | POA: Diagnosis not present

## 2019-10-11 ENCOUNTER — Encounter (HOSPITAL_COMMUNITY): Payer: Self-pay

## 2019-10-14 DIAGNOSIS — R69 Illness, unspecified: Secondary | ICD-10-CM | POA: Diagnosis not present

## 2019-10-17 DIAGNOSIS — M545 Low back pain: Secondary | ICD-10-CM | POA: Diagnosis not present

## 2019-10-17 DIAGNOSIS — M5416 Radiculopathy, lumbar region: Secondary | ICD-10-CM | POA: Diagnosis not present

## 2019-10-21 DIAGNOSIS — M5416 Radiculopathy, lumbar region: Secondary | ICD-10-CM | POA: Diagnosis not present

## 2019-10-21 DIAGNOSIS — M545 Low back pain: Secondary | ICD-10-CM | POA: Diagnosis not present

## 2019-10-24 DIAGNOSIS — K5900 Constipation, unspecified: Secondary | ICD-10-CM | POA: Diagnosis not present

## 2019-10-24 DIAGNOSIS — G894 Chronic pain syndrome: Secondary | ICD-10-CM | POA: Diagnosis not present

## 2019-10-24 DIAGNOSIS — M62830 Muscle spasm of back: Secondary | ICD-10-CM | POA: Diagnosis not present

## 2019-10-24 DIAGNOSIS — M47816 Spondylosis without myelopathy or radiculopathy, lumbar region: Secondary | ICD-10-CM | POA: Diagnosis not present

## 2019-10-28 DIAGNOSIS — R69 Illness, unspecified: Secondary | ICD-10-CM | POA: Diagnosis not present

## 2019-11-03 DIAGNOSIS — G8929 Other chronic pain: Secondary | ICD-10-CM | POA: Diagnosis not present

## 2019-11-03 DIAGNOSIS — R7303 Prediabetes: Secondary | ICD-10-CM | POA: Diagnosis not present

## 2019-11-03 DIAGNOSIS — Z9884 Bariatric surgery status: Secondary | ICD-10-CM | POA: Diagnosis not present

## 2019-11-03 DIAGNOSIS — R69 Illness, unspecified: Secondary | ICD-10-CM | POA: Diagnosis not present

## 2019-11-03 DIAGNOSIS — K219 Gastro-esophageal reflux disease without esophagitis: Secondary | ICD-10-CM | POA: Diagnosis not present

## 2019-11-03 DIAGNOSIS — E669 Obesity, unspecified: Secondary | ICD-10-CM | POA: Diagnosis not present

## 2019-11-03 DIAGNOSIS — M5441 Lumbago with sciatica, right side: Secondary | ICD-10-CM | POA: Diagnosis not present

## 2019-11-03 DIAGNOSIS — K5909 Other constipation: Secondary | ICD-10-CM | POA: Diagnosis not present

## 2019-11-04 ENCOUNTER — Other Ambulatory Visit: Payer: Self-pay | Admitting: General Surgery

## 2019-11-04 DIAGNOSIS — Z9884 Bariatric surgery status: Secondary | ICD-10-CM

## 2019-11-05 DIAGNOSIS — J209 Acute bronchitis, unspecified: Secondary | ICD-10-CM | POA: Diagnosis not present

## 2019-11-05 DIAGNOSIS — J309 Allergic rhinitis, unspecified: Secondary | ICD-10-CM | POA: Diagnosis not present

## 2019-11-05 DIAGNOSIS — L2089 Other atopic dermatitis: Secondary | ICD-10-CM | POA: Diagnosis not present

## 2019-11-05 DIAGNOSIS — J45909 Unspecified asthma, uncomplicated: Secondary | ICD-10-CM | POA: Diagnosis not present

## 2019-11-08 DIAGNOSIS — R69 Illness, unspecified: Secondary | ICD-10-CM | POA: Diagnosis not present

## 2019-11-15 ENCOUNTER — Ambulatory Visit: Payer: Medicare HMO | Admitting: Dietician

## 2019-11-21 ENCOUNTER — Ambulatory Visit
Admission: RE | Admit: 2019-11-21 | Discharge: 2019-11-21 | Disposition: A | Discharge status: Home / Self Care | Payer: Medicare HMO | Source: Ambulatory Visit | Attending: General Surgery | Admitting: General Surgery

## 2019-11-21 ENCOUNTER — Other Ambulatory Visit: Payer: Self-pay

## 2019-11-21 DIAGNOSIS — M62830 Muscle spasm of back: Secondary | ICD-10-CM | POA: Diagnosis not present

## 2019-11-21 DIAGNOSIS — Z9884 Bariatric surgery status: Secondary | ICD-10-CM

## 2019-11-21 DIAGNOSIS — G894 Chronic pain syndrome: Secondary | ICD-10-CM | POA: Diagnosis not present

## 2019-11-21 DIAGNOSIS — K5900 Constipation, unspecified: Secondary | ICD-10-CM | POA: Diagnosis not present

## 2019-11-21 DIAGNOSIS — M47816 Spondylosis without myelopathy or radiculopathy, lumbar region: Secondary | ICD-10-CM | POA: Diagnosis not present

## 2019-11-21 DIAGNOSIS — R109 Unspecified abdominal pain: Secondary | ICD-10-CM | POA: Diagnosis not present

## 2019-11-21 MED ORDER — IOPAMIDOL (ISOVUE-300) INJECTION 61%
100.0000 mL | Freq: Once | INTRAVENOUS | Status: AC | PRN
  Administered 2019-11-21: 100 mL via INTRAVENOUS

## 2019-11-22 DIAGNOSIS — M5416 Radiculopathy, lumbar region: Secondary | ICD-10-CM | POA: Diagnosis not present

## 2019-12-06 DIAGNOSIS — L570 Actinic keratosis: Secondary | ICD-10-CM | POA: Diagnosis not present

## 2019-12-06 DIAGNOSIS — L920 Granuloma annulare: Secondary | ICD-10-CM | POA: Diagnosis not present

## 2019-12-07 DIAGNOSIS — R69 Illness, unspecified: Secondary | ICD-10-CM | POA: Diagnosis not present

## 2019-12-13 DIAGNOSIS — R1012 Left upper quadrant pain: Secondary | ICD-10-CM | POA: Diagnosis not present

## 2019-12-13 DIAGNOSIS — K5900 Constipation, unspecified: Secondary | ICD-10-CM | POA: Insufficient documentation

## 2019-12-13 DIAGNOSIS — J4540 Moderate persistent asthma, uncomplicated: Secondary | ICD-10-CM | POA: Diagnosis not present

## 2019-12-13 DIAGNOSIS — R69 Illness, unspecified: Secondary | ICD-10-CM | POA: Diagnosis not present

## 2019-12-13 DIAGNOSIS — E669 Obesity, unspecified: Secondary | ICD-10-CM | POA: Diagnosis not present

## 2019-12-13 DIAGNOSIS — E785 Hyperlipidemia, unspecified: Secondary | ICD-10-CM | POA: Diagnosis not present

## 2019-12-13 DIAGNOSIS — I10 Essential (primary) hypertension: Secondary | ICD-10-CM | POA: Diagnosis not present

## 2019-12-18 ENCOUNTER — Other Ambulatory Visit: Payer: Self-pay | Admitting: Family Medicine

## 2019-12-26 DIAGNOSIS — G894 Chronic pain syndrome: Secondary | ICD-10-CM | POA: Diagnosis not present

## 2019-12-26 DIAGNOSIS — M461 Sacroiliitis, not elsewhere classified: Secondary | ICD-10-CM | POA: Diagnosis not present

## 2019-12-26 DIAGNOSIS — H5210 Myopia, unspecified eye: Secondary | ICD-10-CM | POA: Diagnosis not present

## 2019-12-26 DIAGNOSIS — M47816 Spondylosis without myelopathy or radiculopathy, lumbar region: Secondary | ICD-10-CM | POA: Diagnosis not present

## 2019-12-26 DIAGNOSIS — Z0100 Encounter for examination of eyes and vision without abnormal findings: Secondary | ICD-10-CM | POA: Diagnosis not present

## 2019-12-26 DIAGNOSIS — Z135 Encounter for screening for eye and ear disorders: Secondary | ICD-10-CM | POA: Diagnosis not present

## 2019-12-26 DIAGNOSIS — M62830 Muscle spasm of back: Secondary | ICD-10-CM | POA: Diagnosis not present

## 2019-12-28 DIAGNOSIS — R69 Illness, unspecified: Secondary | ICD-10-CM | POA: Diagnosis not present

## 2020-01-01 ENCOUNTER — Encounter (HOSPITAL_COMMUNITY): Payer: Self-pay | Admitting: Emergency Medicine

## 2020-01-01 ENCOUNTER — Other Ambulatory Visit: Payer: Self-pay

## 2020-01-01 ENCOUNTER — Emergency Department (HOSPITAL_COMMUNITY)
Admission: EM | Admit: 2020-01-01 | Discharge: 2020-01-01 | Disposition: A | Discharge status: Home / Self Care | Payer: Medicare HMO | Attending: Emergency Medicine | Admitting: Emergency Medicine

## 2020-01-01 DIAGNOSIS — Z91040 Latex allergy status: Secondary | ICD-10-CM | POA: Insufficient documentation

## 2020-01-01 DIAGNOSIS — Z7984 Long term (current) use of oral hypoglycemic drugs: Secondary | ICD-10-CM | POA: Diagnosis not present

## 2020-01-01 DIAGNOSIS — I10 Essential (primary) hypertension: Secondary | ICD-10-CM | POA: Diagnosis not present

## 2020-01-01 DIAGNOSIS — E119 Type 2 diabetes mellitus without complications: Secondary | ICD-10-CM | POA: Insufficient documentation

## 2020-01-01 DIAGNOSIS — Z79899 Other long term (current) drug therapy: Secondary | ICD-10-CM | POA: Insufficient documentation

## 2020-01-01 DIAGNOSIS — K5900 Constipation, unspecified: Secondary | ICD-10-CM | POA: Insufficient documentation

## 2020-01-01 LAB — CBC
HCT: 41.1 % (ref 36.0–46.0)
Hemoglobin: 12.8 g/dL (ref 12.0–15.0)
MCH: 27.1 pg (ref 26.0–34.0)
MCHC: 31.1 g/dL (ref 30.0–36.0)
MCV: 86.9 fL (ref 80.0–100.0)
Platelets: 284 10*3/uL (ref 150–400)
RBC: 4.73 MIL/uL (ref 3.87–5.11)
RDW: 14.7 % (ref 11.5–15.5)
WBC: 8 10*3/uL (ref 4.0–10.5)
nRBC: 0 % (ref 0.0–0.2)

## 2020-01-01 LAB — COMPREHENSIVE METABOLIC PANEL
ALT: 14 U/L (ref 0–44)
AST: 19 U/L (ref 15–41)
Albumin: 3.9 g/dL (ref 3.5–5.0)
Alkaline Phosphatase: 68 U/L (ref 38–126)
Anion gap: 11 (ref 5–15)
BUN: 6 mg/dL (ref 6–20)
CO2: 27 mmol/L (ref 22–32)
Calcium: 9.1 mg/dL (ref 8.9–10.3)
Chloride: 101 mmol/L (ref 98–111)
Creatinine, Ser: 0.85 mg/dL (ref 0.44–1.00)
GFR calc Af Amer: >60 mL/min (ref >60)
GFR calc non Af Amer: >60 mL/min (ref >60)
Glucose, Bld: 162 mg/dL — ABNORMAL HIGH (ref 70–99)
Potassium: 3.6 mmol/L (ref 3.5–5.1)
Sodium: 139 mmol/L (ref 135–145)
Total Bilirubin: 0.5 mg/dL (ref 0.3–1.2)
Total Protein: 6.9 g/dL (ref 6.5–8.1)

## 2020-01-01 LAB — LIPASE, BLOOD: Lipase: 38 U/L (ref 11–51)

## 2020-01-01 MED ORDER — SODIUM CHLORIDE 0.9% FLUSH: 3.0000 mL | Freq: Once | INTRAVENOUS | Status: DC

## 2020-01-01 MED ORDER — MILK AND MOLASSES ENEMA
1.0000 | Freq: Once | RECTAL | Status: AC
  Administered 2020-01-01: 240 mL via RECTAL
  Filled 2020-01-01: qty 240

## 2020-01-01 MED ORDER — POLYETHYLENE GLYCOL 3350 17 G PO PACK: 17.0000 g | PACK | Freq: Every day | ORAL | 0 refills | Status: DC

## 2020-01-01 NOTE — ED Triage Notes (Signed)
C/o constipation x 8 weeks.  States she has used 4 enemas with very small amount of stool.  Tried multiple meds with no success.  States she did have a CT scan of abd and referred to her GI. Tried to get appt with GI and can't be seen until August 3rd.  C/o lower abd pain.

## 2020-01-01 NOTE — ED Provider Notes (Signed)
[name] EMERGENCY DEPARTMENT Provider Note   CSN: [id number] Arrival date & time: 01/01/20  4268     History Chief Complaint  Patient presents with  . Constipation  . Abdominal Pain    [name] is a 54 y.o. female with history of chronic pain syndrome, history of gastric sleeve who presents with constipation.  She states that she has not had a normal bowel movement in approximately 8 weeks.  She is taking Movantik, Linzess, laxatives, and stool softeners and has not had a a normal bowel movement.  She tried an enema and initially had a bowel movement although it was small but then tried it several more times and it stopped working.  She takes Dilaudid and has fentanyl patches for her chronic back pain.  She saw the surgeon who did her gastric sleeve last month and had a CT scan of her abdomen which showed no evidence of any bowel obstruction but did show constipation.  She made appointment with her GI doctor but they cannot get her in until August and therefore she decided to come to the ED.  She denies fever, chills, nausea, vomiting.  She has left-sided abdominal pain.  HPI     Past Medical History:  Diagnosis Date  . Allergy   . Anxiety   . Arthritis   . Asthma    History of Asthma  . Bipolar disorder ([place])   . Chest pain, atypical 11/30/2012  . Clotting disorder ([place])   . Depression   . Diabetes mellitus without complication ([place])    type 2, pre-diabetic before she lost weight  . GERD (gastroesophageal reflux disease)   . H/O degenerative disc disease    L4-L5, L5-S1  . Heart murmur   . Hyperglycemia    Postoperative hyperglycemia  . Hypertension    hx of  . 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 Obesity 07/14/2012  . OSA (obstructive sleep apnea)    mild  . Pneumonia   . Polycystic disease, ovaries   . Postoperative anemia    2018 - while dieting  hx of  . Reflux   . Sinus tachycardia  07/14/2012   Pt says HR > 100 is consistent  . Sleep apnea    has had two tests, different results- no CPAP used    Patient Active Problem List   Diagnosis Date Noted  . Positive colorectal cancer screening using Cologuard test 03/02/2018  . Leukocytosis 01/26/2018  . Onychomycosis of great toe 12/22/2017  . Family history of DVT 03/20/2017  . Arthritis of right knee 05/13/2016  . Dyslipidemia 03/04/2016  . Right knee pain 03/03/2016  . Pre-diabetes 11/01/2015  . Allergic rhinitis 05/28/2015  . BMI 33.0-33.9,adult 05/24/2013  . Polycystic ovary disease 07/14/2012  . Obstructive sleep apnea (adult) (pediatric) 02/04/2012  . Perimenopausal 11/10/2011  . Disturbance in sleep behavior 09/16/2011  . Asthma 05/23/2011  . Backache 05/23/2011  . Bipolar disorder in partial remission ([place]) 05/23/2011  . Dysphagia 05/23/2011  . Esophageal reflux 05/23/2011    Past Surgical History:  Procedure Laterality Date  . COLONOSCOPY    . FOOT SURGERY     Right plantar facsiitis scrape  . KNEE SURGERY Bilateral 2007  . LAPAROSCOPIC GASTRIC SLEEVE RESECTION N/A 03/30/2017   Procedure: LAPAROSCOPIC GASTRIC SLEEVE RESECTION, UPPER ENDO;  Surgeon: Greer Pickerel, MD;  Location: [facility]S;  Service: General;  Laterality: N/A;  . NASAL SINUS SURGERY  2004  . OVARIAN CYST REMOVAL  1998   A cyst on the fallopian tube removed  . SPINAL FUSION    . TONSILLECTOMY  1996     OB History    Gravida  1   Para      Term      Preterm      AB  1   Living        SAB      TAB      Ectopic      Multiple      Live Births              Family History  Problem Relation Age of Onset  . Cancer Father        Leukemia  . Diabetes Father   . Diabetes Mother   . Cancer Maternal Grandmother        Stomach  . Colon cancer Maternal Grandmother   . Cancer Paternal Grandmother   . Cancer Paternal Aunt   . Diabetes Paternal Aunt   . Cancer Paternal Uncle   . Cancer Paternal Aunt   . Rectal  cancer Neg Hx   . Stomach cancer Neg Hx   . Esophageal cancer Neg Hx     Social History   Tobacco Use  . Smoking status: Never Smoker  . Smokeless tobacco: Never Used  Vaping Use  . Vaping Use: Never used  Substance Use Topics  . Alcohol use: No  . Drug use: No    Home Medications Prior to Admission medications   Medication Sig Start Date End Date Taking? Authorizing Provider  albuterol (PROAIR HFA) 108 (90 BASE) MCG/ACT inhaler Inhale 2 puffs into the lungs every 4 (four) hours as needed for wheezing or shortness of breath.    [provider]  ALPRAZolam Duanne Moron) 1 MG tablet Take 1 mg by mouth 3 (three) times daily. 04/24/19   [provider]  budesonide-formoterol (SYMBICORT) 160-4.5 MCG/ACT inhaler Inhale 2 puffs into the lungs 2 (two) times daily. 01/26/18   Gregor Hams, MD  buPROPion (WELLBUTRIN XL) 150 MG 24 hr tablet Take 150 mg by mouth daily with breakfast. Total daily dose=450mg  11/16/12   [provider]  buPROPion (WELLBUTRIN XL) 300 MG 24 hr tablet Take 300 mg by mouth daily with breakfast. Total daily dose=450mg  03/02/16   [provider]  Calcium Citrate-Vitamin D (CELEBRATE CALCIUM PLUS 500) 500-333 MG-UNIT CHEW Chew 1 tablet by mouth 3 (three) times daily.    [provider]  docusate sodium (COLACE) 100 MG capsule Take 100 mg by mouth 2 (two) times daily.    [provider]  escitalopram (LEXAPRO) 20 MG tablet Take 1 tablet (20 mg total) by mouth daily with breakfast. 07/09/18   Gregor Hams, MD  estradiol (ESTRACE) 1 MG tablet Take 1 tablet (1 mg total) by mouth daily. 05/16/19   Emily Filbert, MD  fentaNYL (DURAGESIC - DOSED MCG/HR) 50 MCG/HR Place 50 mcg onto the skin every 3 (three) days.  04/27/15   [provider]  fluticasone (FLONASE) 50 MCG/ACT nasal spray INSTILL 1 SPRAY IN EACH NOSTRIL ONCE DAILY AS NEEDED FOR ALLERGIES 12/27/18   Gregor Hams, MD  HYDROmorphone (DILAUDID) 2 MG tablet Take 2 mg by  mouth every 6 (six) hours. 04/25/19   [provider]  lidocaine (XYLOCAINE) 2 % jelly Apply 1 application topically as needed. 05/16/19   Emily Filbert, MD  medroxyPROGESTERone (PROVERA) 2.5 MG tablet Take  1 tablet (2.5 mg total) by mouth daily. 05/16/19   Dove, Wilhemina Cash, MD  MOVANTIK 25 MG TABS tablet Take 25 mg by mouth at bedtime.  03/17/17   [provider]  Multiple Vitamins-Minerals (CELEBRATE MULTI-COMPLETE 36) CHEW Chew 1 tablet by mouth 2 (two) times daily.    [provider]  pantoprazole (PROTONIX) 40 MG tablet TAKE 1 TABLET BY MOUTH EVERY DAY 02/16/19   Gregor Hams, MD  QUEtiapine (SEROQUEL) 50 MG tablet TAKE 1/2 TO 2 TABLETS BY MOUTH AT BEDTIME AS NEEDED FOR SLEEP. PT NEEDS APPT. BEFORE NEXT REFILL. 05/24/19   Silverio Decamp, MD  Sennosides (SENOKOT PO) Take by mouth. 2 tablets at night    [provider]  temazepam (RESTORIL) 15 MG capsule Take 30 mg by mouth at bedtime as needed.    [provider]  terbinafine (LAMISIL) 250 MG tablet TAKE 1 TABLET (250 MG TOTAL) BY MOUTH DAILY. DUE FOR FOLLOW UP 06/09/19   Hali Marry, MD  tiZANidine (ZANAFLEX) 2 MG tablet Take 2 mg by mouth 3 (three) times daily as needed. 08/16/18   [provider]  traZODone (DESYREL) 100 MG tablet Take 300 mg by mouth at bedtime. 05/09/19   [provider]  valACYclovir (VALTREX) 1000 MG tablet TAKE 1 TABLET BY MOUTH EVERY DAY 05/16/19   Clovia Cuff C, MD    Allergies    Carbamazepine, Gabapentin, Pineapple, Shellfish-derived products, Erythromycin, Latex, Pregabalin, Duloxetine, Other, and Pollen extract  Review of Systems   Review of Systems  Constitutional: Negative for chills and fever.  Respiratory: Negative for shortness of breath.   Cardiovascular: Negative for chest pain.  Gastrointestinal: Positive for abdominal pain and constipation. Negative for nausea and vomiting.  All other systems reviewed and are negative.   Physical  Exam Updated Vital Signs BP 131/70 (BP Location: Left Arm)   Pulse 89   Temp 98.3 F (36.8 C) (Oral)   Resp 14   Ht 5\' 7"  (1.702 m)   Wt 89.8 kg   LMP 08/22/2016 (Approximate)   SpO2 100%   BMI 31.01 kg/m   Physical Exam Vitals and nursing note reviewed.  Constitutional:      General: She is not in acute distress.    Appearance: She is well-developed. She is obese. She is not ill-appearing.  HENT:     Head: Normocephalic and atraumatic.  Eyes:     General: No scleral icterus.       Right eye: No discharge.        Left eye: No discharge.     Conjunctiva/sclera: Conjunctivae normal.     Pupils: Pupils are equal, round, and reactive to light.  Cardiovascular:     Rate and Rhythm: Normal rate and regular rhythm.  Pulmonary:     Effort: Pulmonary effort is normal. No respiratory distress.     Breath sounds: Normal breath sounds.  Abdominal:     General: Abdomen is protuberant. There is no distension.     Palpations: Abdomen is soft.     Tenderness: There is abdominal tenderness (Diffuse left sided abdominal pain).  Genitourinary:    Comments: Rectal: No tenderness. No fecal impaction Musculoskeletal:     Cervical back: Normal range of motion.  Skin:    General: Skin is warm and dry.  Neurological:     Mental Status: She is alert and oriented to person, place, and time.  Psychiatric:        Behavior: Behavior normal.  ED Results / Procedures / Treatments   Labs (all labs ordered are listed, but only abnormal results are displayed) Labs Reviewed  COMPREHENSIVE METABOLIC PANEL - Abnormal; Notable for the following components:      Result Value   Glucose, Bld 162 (*)    All other components within normal limits  LIPASE, BLOOD  CBC  URINALYSIS, ROUTINE W REFLEX MICROSCOPIC    EKG None  Radiology No results found.  Procedures Procedures (including critical care time)  Medications Ordered in ED Medications  sodium chloride flush (NS) 0.9 % injection 3  mL (has no administration in time range)    ED Course  I have reviewed the triage vital signs and the nursing notes.  Pertinent labs & imaging results that were available during my care of the patient were reviewed by me and considered in my medical decision making (see chart for details).  54 year old female presents with abdominal pain and constipation which is been going on for 2 months.  She is taking multiple prescription and over-the-counter remedies without relief.  Vital signs are normal.  Abdomen is soft and minimally tender in the left side.  She had a recent CT scan which was consistent with constipation.  I do not feel like we need to repeat this today.  Her lab work is normal.  Constipation is likely medication do so she is on multiple narcotics chronically.  Rectal exam was performed and there is no fecal impaction.  Will order enema and reassess  Patient had large bowel movement after the enema.  She still feels like she has a lot of stool and is having ongoing left-sided abdominal pain.  She was offered repeat enema versus prescription for MiraLAX.  She is opting for prescription which was given and advised to follow-up with GI.  MDM Rules/Calculators/A&P                           Final Clinical Impression(s) / ED Diagnoses Final diagnoses:  Constipation, unspecified constipation type    Rx / DC Orders ED Discharge Orders    None       Recardo Evangelist, PA-C 01/01/20 1529    Pattricia Boss, MD 01/02/20 1256

## 2020-01-01 NOTE — Discharge Instructions (Addendum)
Starting tomorrow, take 6 capfuls of MiraLAX in a 32 oz bottle of Gatorade over 2-4 hour period.  On day 2, take 3 capfuls, three times a day.  On day 3, take 1 capful 3 times a day. Slowly cut back as needed until you have normal bowel movements.  Follow up with Dr. Henrene Pastor

## 2020-01-04 ENCOUNTER — Encounter: Payer: Self-pay | Admitting: Physician Assistant

## 2020-01-04 ENCOUNTER — Ambulatory Visit: Payer: Medicare HMO | Admitting: Physician Assistant

## 2020-01-04 ENCOUNTER — Other Ambulatory Visit: Payer: Self-pay

## 2020-01-04 ENCOUNTER — Ambulatory Visit (INDEPENDENT_AMBULATORY_CARE_PROVIDER_SITE_OTHER)
Admission: RE | Admit: 2020-01-04 | Discharge: 2020-01-04 | Disposition: A | Discharge status: Home / Self Care | Payer: Medicare HMO | Source: Ambulatory Visit | Attending: Physician Assistant | Admitting: Physician Assistant

## 2020-01-04 VITALS — BP 120/72 | HR 74 | Ht 67.0 in | Wt 191.2 lb

## 2020-01-04 DIAGNOSIS — K5900 Constipation, unspecified: Secondary | ICD-10-CM

## 2020-01-04 DIAGNOSIS — R1032 Left lower quadrant pain: Secondary | ICD-10-CM | POA: Diagnosis not present

## 2020-01-04 MED ORDER — LINACLOTIDE 290 MCG PO CAPS: 290.0000 ug | ORAL_CAPSULE | Freq: Every day | ORAL | 3 refills | Status: DC

## 2020-01-04 NOTE — Patient Instructions (Signed)
If you are age 53 or older, your body mass index should be between 23-30. Your Body mass index is 29.95 kg/m. If this is out of the aforementioned range listed, please consider follow up with your Primary Care Provider.  If you are age 41 or younger, your body mass index should be between 19-25. Your Body mass index is 29.95 kg/m. If this is out of the aformentioned range listed, please consider follow up with your Primary Care Provider.   Your provider has requested that you have an abdominal x ray before leaving today. Please go to the basement floor to our Radiology department for the test.  We have sent the following medications to your pharmacy for you to pick up at your convenience: Linzess 290 mcg daily.   Anderson Malta recommends that you complete a bowel purge (to clean out your bowels). Please follow the steps provided in the [facility]pi[facility] box.

## 2020-01-04 NOTE — Progress Notes (Signed)
Assessment and plan reviewed 

## 2020-01-04 NOTE — Progress Notes (Signed)
Chief Complaint: Constipation  HPI:    [name] is a 54 year old female with a past medical history as listed below including chronic pain syndrome on opioids and history of gastric sleeve, known to Dr. Henrene Pastor, who presents clinic today with complaint of constipation.    04/21/2018 EGD for reflux with stomach with normal post sleeve gastrectomy and otherwise normal.  Colonoscopy on the same day for positive Cologuard test.  With 1 3 mm polyp in the descending colon, internal hemorrhoids and otherwise normal.  Repeat recommended in 5 years.    11/21/2019 CT of the abdomen pelvis with contrast showed diffuse stool throughout the colon, no bowel obstruction, no evident diverticular disease, status post gastric sleeve resection without appreciable complicating feature by CT.  Appendix appeared normal.  No evident renal or ureteral calculus.  Fairly small hiatal hernia.  Fatty infiltration portions of the head and neck in a process of the pancreas.  Postoperative changes in the lower lumbar upper sacral region leads.    01/01/2020 patient went to the ED with constipation abdominal pain.  She describes she not had a normal bowel movement in 8 weeks.  She is taking Movantik, Linzess, laxatives and stool softeners.  CMP, lipase, CBC and urinalysis normal.  Rectal exam was done there was no impaction.  She was ordered an enema.  She had a large bowel movement after the enema.    Today, the patient presents to clinic accompanied by her mother who does assist with history.  She explains that she has had trouble with constipation over the past 8 weeks or so.  Explains that she is on chronic pain medication and due to this was taking Movantik once daily as well as a Dulcolax and Colace which seemed to do pretty well until she had a change 8 weeks ago.  Notes that this stopped working for her, so her PCP gave her Linzess 145 mcg which she took once and gave her explosive diarrhea in the middle of the night.  Due to  this it was decreased to 75 mcg per night which she was taking with her Movantik and stool softeners and still not having any luck.  Her mom then started doing enemas, 4 days in a row which got "just a little bit out", patient continued to feel constipated and started with a left lower quadrant pain proceed to the ER as above.  Enema there got more stool out and they then put her on a MiraLAX purge which she did over the next few days and continue to move some amount of stool, but "I still feel full".    Describes a history of ileus years ago.    Also describes that she is anemic and her PCP wants to put her on oral iron supplementation.  She is worried that this will worsen her symptoms.    Patient is also on a lot of sleeping medication including Trazodone, 3 tabs at night and multiple other things to help her sleep.    Denies fever, chills, weight loss, blood in her stool or symptoms that awaken her from sleep.  Past Medical History:  Diagnosis Date  . Allergy   . Anxiety   . Arthritis   . Asthma    History of Asthma  . Bipolar disorder ([place])   . Chest pain, atypical 11/30/2012  . Clotting disorder ([place])   . Depression   . Diabetes mellitus without complication ([place])    type 2, pre-diabetic before she lost weight  .  GERD (gastroesophageal reflux disease)   . H/O degenerative disc disease    L4-L5, L5-S1  . Heart murmur   . Hyperglycemia    Postoperative hyperglycemia  . Hypertension    hx of  . 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 Obesity 07/14/2012  . OSA (obstructive sleep apnea)    mild  . Pneumonia   . Polycystic disease, ovaries   . Postoperative anemia    2018 - while dieting  hx of  . Reflux   . Sinus tachycardia 07/14/2012   Pt says HR > 100 is consistent  . Sleep apnea    has had two tests, different results- no CPAP used    Past Surgical History:  Procedure Laterality Date  . COLONOSCOPY    . FOOT SURGERY     Right  plantar facsiitis scrape  . KNEE SURGERY Bilateral 2007  . LAPAROSCOPIC GASTRIC SLEEVE RESECTION N/A 03/30/2017   Procedure: LAPAROSCOPIC GASTRIC SLEEVE RESECTION, UPPER ENDO;  Surgeon: Greer Pickerel, MD;  Location: [facility];  Service: General;  Laterality: N/A;  . NASAL SINUS SURGERY  2004  . OVARIAN CYST REMOVAL  1998   A cyst on the fallopian tube removed  . SPINAL FUSION    . TONSILLECTOMY  1996    Current Outpatient Medications  Medication Sig Dispense Refill  . albuterol (PROAIR HFA) 108 (90 BASE) MCG/ACT inhaler Inhale 2 puffs into the lungs every 4 (four) hours as needed for wheezing or shortness of breath.    . ALPRAZolam (XANAX XR) 1 MG 24 hr tablet Take 1 mg by mouth in the morning and at bedtime.    . budesonide-formoterol (SYMBICORT) 160-4.5 MCG/ACT inhaler Inhale 2 puffs into the lungs 2 (two) times daily. (Patient taking differently: Inhale 2 puffs into the lungs 2 (two) times daily as needed (shortness of breath). ) 3 Inhaler 4  . buPROPion (WELLBUTRIN XL) 150 MG 24 hr tablet Take 150 mg by mouth daily with breakfast. Total daily dose=450mg     . buPROPion (WELLBUTRIN XL) 300 MG 24 hr tablet Take 300 mg by mouth daily with breakfast. Total daily dose=450mg     . dexlansoprazole (DEXILANT) 60 MG capsule Take 60 mg by mouth daily.    Marland Kitchen docusate sodium (COLACE) 100 MG capsule Take 100 mg by mouth at bedtime.     Marland Kitchen escitalopram (LEXAPRO) 20 MG tablet Take 1 tablet (20 mg total) by mouth daily with breakfast. 30 tablet 3  . estradiol (ESTRACE) 1 MG tablet Take 1 tablet (1 mg total) by mouth daily. 90 tablet 5  . fentaNYL (DURAGESIC - DOSED MCG/HR) 50 MCG/HR Place 50 mcg onto the skin every 3 (three) days.     . fluticasone (FLONASE) 50 MCG/ACT nasal spray INSTILL 1 SPRAY IN EACH NOSTRIL ONCE DAILY AS NEEDED FOR ALLERGIES (Patient taking differently: Place 1 spray into both nostrils daily as needed for allergies. ) 16 mL 12  . HYDROmorphone (DILAUDID) 2 MG tablet Take 2 mg by mouth every  6 (six) hours.    . lidocaine (XYLOCAINE) 2 % jelly Apply 1 application topically as needed. 30 mL 2  . LINZESS 72 MCG capsule Take 72 mcg by mouth at bedtime.     . medroxyPROGESTERone (PROVERA) 2.5 MG tablet Take 1 tablet (2.5 mg total) by mouth daily. 90 tablet 5  . montelukast (SINGULAIR) 10 MG tablet Take 10 mg by mouth at bedtime.    Marland Kitchen MOVANTIK 25 MG TABS  tablet Take 25 mg by mouth at bedtime.     . Multiple Vitamins-Minerals (CELEBRATE MULTI-COMPLETE 36) CHEW Chew 1 tablet by mouth 2 (two) times daily.    . polyethylene glycol (MIRALAX / GLYCOLAX) 17 g packet Take 17 g by mouth daily as needed for mild constipation.    . polyethylene glycol (MIRALAX) 17 g packet Take 17 g by mouth daily. 14 each 0  . PRESCRIPTION MEDICATION Apply 1 application topically every 3 (three) days. Testosterone cream-compounded    . Sennosides (SENOKOT PO) Take 2 tablets by mouth at bedtime. 2 tablets at night     . temazepam (RESTORIL) 15 MG capsule Take 30 mg by mouth at bedtime.     Marland Kitchen tiZANidine (ZANAFLEX) 2 MG tablet Take 2 mg by mouth 3 (three) times daily as needed.    . traZODone (DESYREL) 100 MG tablet Take 300 mg by mouth at bedtime.    . valACYclovir (VALTREX) 1000 MG tablet TAKE 1 TABLET BY MOUTH EVERY DAY (Patient taking differently: Take 1,000 mg by mouth daily. ) 90 tablet 4   No current facility-administered medications for this visit.    Allergies as of 01/04/2020 - Review Complete 01/01/2020  Allergen Reaction Noted  . Carbamazepine Swelling 07/09/2018  . Gabapentin Nausea And Vomiting and Anaphylaxis 04/22/2011  . Pineapple Shortness Of Breath and Swelling   . Shellfish-derived products Anaphylaxis 12/13/2014  . Erythromycin Swelling 04/22/2011  . Latex Rash 04/22/2011  . Pregabalin Swelling 04/22/2011  . Duloxetine Other (See Comments) 05/28/2015  . Other  03/19/2018  . Pollen extract Other (See Comments) 02/04/2012    Family History  Problem Relation Age of Onset  . Cancer  Father        Leukemia  . Diabetes Father   . Diabetes Mother   . Cancer Maternal Grandmother        Stomach  . Colon cancer Maternal Grandmother   . Cancer Paternal Grandmother   . Cancer Paternal Aunt   . Diabetes Paternal Aunt   . Cancer Paternal Uncle   . Cancer Paternal Aunt   . Rectal cancer Neg Hx   . Stomach cancer Neg Hx   . Esophageal cancer Neg Hx     Social History   Socioeconomic History  . Marital status: Divorced    Spouse name: Not on file  . Number of children: 0  . Years of education: Not on file  . Highest education level: Not on file  Occupational History  . Occupation: disabled    Fish farm manager: UNEMPLOYED  Tobacco Use  . Smoking status: Never Smoker  . Smokeless tobacco: Never Used  Vaping Use  . Vaping Use: Never used  Substance and Sexual Activity  . Alcohol use: No  . Drug use: No  . Sexual activity: Yes    Birth control/protection: None  Other Topics Concern  . Not on file  Social History Narrative  . Not on file   Social Determinants of Health   Financial Resource Strain:   . Difficulty of Paying Living Expenses:   Food Insecurity:   . Worried About Charity fundraiser in the Last Year:   . Arboriculturist in the Last Year:   Transportation Needs:   . Film/video editor (Medical):   Marland Kitchen Lack of Transportation (Non-Medical):   Physical Activity:   . Days of Exercise per Week:   . Minutes of Exercise per Session:   Stress:   . Feeling of Stress :   Social  Connections:   . Frequency of Communication with Friends and Family:   . Frequency of Social Gatherings with Friends and Family:   . Attends Religious Services:   . Active Member of Clubs or Organizations:   . Attends Archivist Meetings:   Marland Kitchen Marital Status:   Intimate Partner Violence:   . Fear of Current or Ex-Partner:   . Emotionally Abused:   Marland Kitchen Physically Abused:   . Sexually Abused:     Review of Systems:    Constitutional: No weight loss, fever or  chills Cardiovascular: No chest pain  Respiratory: No SOB  Gastrointestinal: See HPI and otherwise negative   Physical Exam:  Vital signs: BP 120/72   Ht 5\' 7"  (1.702 m)   Wt 191 lb 3.2 oz (86.7 kg)   LMP 08/22/2016 (Approximate)   BMI 29.95 kg/m   Constitutional:   Pleasant Caucasian female appears to be in NAD, Well developed, Well nourished, alert and cooperative Respiratory: Respirations even and unlabored. Lungs clear to auscultation bilaterally.   No wheezes, crackles, or rhonchi.  Cardiovascular: Normal S1, S2. No MRG. Regular rate and rhythm. No peripheral edema, cyanosis or pallor.  Gastrointestinal:  Soft, nondistended, moderate LLQ ttp. No rebound or guarding. Normal bowel sounds. No appreciable masses or hepatomegaly. Rectal:  Not performed.  Psychiatric: Demonstrates good judgement and reason without abnormal affect or behaviors.  RELEVANT LABS AND IMAGING: CBC    Component Value Date/Time   WBC 8.0 01/01/2020 0842   RBC 4.73 01/01/2020 0842   HGB 12.8 01/01/2020 0842   HCT 41.1 01/01/2020 0842   PLT 284 01/01/2020 0842   MCV 86.9 01/01/2020 0842   MCH 27.1 01/01/2020 0842   MCHC 31.1 01/01/2020 0842   RDW 14.7 01/01/2020 0842   LYMPHSABS 1,837 03/05/2018 1203   MONOABS 1.1 (H) 03/31/2017 0514   EOSABS 656 (H) 03/05/2018 1203   BASOSABS 57 03/05/2018 1203    CMP     Component Value Date/Time   NA 139 01/01/2020 0842   NA 138 02/26/2016 0000   K 3.6 01/01/2020 0842   CL 101 01/01/2020 0842   CO2 27 01/01/2020 0842   GLUCOSE 162 (H) 01/01/2020 0842   BUN 6 01/01/2020 0842   CREATININE 0.85 01/01/2020 0842   CREATININE 0.93 05/16/2019 1107   CALCIUM 9.1 01/01/2020 0842   PROT 6.9 01/01/2020 0842   ALBUMIN 3.9 01/01/2020 0842   AST 19 01/01/2020 0842   ALT 14 01/01/2020 0842   ALKPHOS 68 01/01/2020 0842   BILITOT 0.5 01/01/2020 0842   GFRNONAA >60 01/01/2020 0842   GFRNONAA 97 06/22/2018 1025   GFRAA >60 01/01/2020 0842   GFRAA 112 06/22/2018 1025     Assessment: 1.  Severe constipation: Likely due to chronic opioid use, recent CT showing constipation, patient has had some movement after a MiraLAX purge and enemas 2.  Anemia: Recent EGD and colonoscopy in October 2019, PCP is discussing starting oral iron supplementation  Plan: 1.  Ordered a KUB for the patient today to assess how severe her constipation is. 2.  Provided the patient with a bowel prep sample.  Explained that after receiving results for KUB will call her and tell her how to take this.  Would recommend that she take half the first day and then half the next until she is running clear. 3.  Would recommend that the patient go on IV iron due to her severe constipation.  Discussed this with her today. 4.  Started the patient  on Linzess 290 mcg daily, recommend she take this in the morning.  She was prescribed #30 with 5 refills.  Also given 4 days of samples. 5.  Explained to the patient I am hoping she does not have to use her stool softeners in the future and hopefully narrow down to at least 1 or 2 products. 6.  Patient to follow in clinic with me in 3 to 4 weeks or sooner if necessary.  Ellouise Newer, PA-C [facility] 01/04/2020, 1:28 PM  Cc: Kristopher Glee., MD

## 2020-01-05 DIAGNOSIS — R69 Illness, unspecified: Secondary | ICD-10-CM | POA: Diagnosis not present

## 2020-01-11 ENCOUNTER — Telehealth: Payer: Self-pay | Admitting: Hematology and Oncology

## 2020-01-11 NOTE — Telephone Encounter (Signed)
Received a new hem referral from Dr. Redmond Pulling at [facility] for iron deficiency. Pt has been cld and scheduled to see Dr. Lorenso Courier on 7/8 at 2pm. Pt aware to arrive 15 minutes early.

## 2020-01-11 NOTE — Progress Notes (Signed)
[place] [phone]   Fax:([phone]) [place] NOTE  Patient Care Team: Kristopher Glee., MD as PCP - General (Internal Medicine) Margaretha Sheffield, MD as Referring Physician (Physical Medicine and Rehabilitation) Norma Fredrickson, MD as Consulting Physician (Psychiatry)  Hematological/Oncological History # Iron Deficiency without Anemia 1) 01/01/2020: WBC 8.0, Hgb 12.8, MCV 86.9, Plt 284 2) 01/12/2020: establish care with Dr. Lorenso Courier   CHIEF COMPLAINTS/PURPOSE OF CONSULTATION:  "Iron deficiency"  HISTORY OF PRESENTING ILLNESS:  [name] 54 y.o. 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   Of note, this patient previously established with Dr. Lebron Conners and was seen by him on 04/13/2017. The visit was for her strong family history of thrombophilia.   On review of the previous records [name] was referred by her GI surgeon after he noted that she had low levels of iron. On 11/03/2019 she was noted to have a serum iron of 31 (nml 45-160). On review of our prior records she was noted to have a consistently normal Hgb since 03/26/2017. On 03/03/2016 she was noted to have an iron of 54, TIBC of 470, iron sat 11%, and ferritin of 25.  The patient reports that she had never been on p.o. iron therapy and that she had not been notified that she has been iron deficient before in the past.  On exam today [name] notes that her primary symptom has been fatigue.  She notes that she wakes up and she feels like she has not slept at all.  She also notes that she cannot sleep without medications to help her.  She notes that she has been having issues with dizzy spells, but denies having any issues of shortness of breath or chest pain.   On further discussion she notes that she has not had any overt signs  of bleeding such as nosebleeds, massive bruises, or dark stools.  She has not had a menstrual cycle in 3 years and reports that she had very heavy menstrual cycles when she was approximate 54 years old and was on birth control pills until menopause.  She does not have any children.  She also notes that she is a never smoker and that she avoids eating a lot of meat and drinks protein shakes in order to help supplement this.  She also struggles with chronic constipation which has been a consistent issue with her pain medication for her back.  Of note she had a colonoscopy and upper endoscopy performed on 04/21/2018 which showed small internal hemorrhoids as well as a normal stomach and duodenum respectively.  Patient currently denies having any fevers, chills, sweats, nausea, abdominal pain, or diarrhea.  A full 10 point ROS is listed below.  MEDICAL HISTORY:  Past Medical History:  Diagnosis Date  . Allergy   . Anxiety   . Arthritis   . Asthma    History of Asthma  . Bipolar disorder ([place])   . Chest pain, atypical 11/30/2012  . Clotting disorder ([place])   . Depression   . Diabetes mellitus without complication ([place])    type 2, pre-diabetic before she lost weight  . GERD (gastroesophageal reflux disease)   . H/O degenerative disc disease    L4-L5, L5-S1  . Heart murmur   . Hyperglycemia    Postoperative hyperglycemia  . Hypertension    hx of  . 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 Obesity 07/14/2012  . OSA (obstructive sleep apnea)    mild  . Pneumonia   . Polycystic disease, ovaries   . Postoperative anemia    2018 - while dieting  hx of  . Reflux   . Sinus tachycardia 07/14/2012   Pt says HR > 100 is consistent  . Sleep apnea    has had two tests, different results- no CPAP used    SURGICAL HISTORY: Past Surgical History:  Procedure Laterality Date  . COLONOSCOPY    . FOOT SURGERY     Right plantar facsiitis scrape  . KNEE  SURGERY Bilateral 2007  . LAPAROSCOPIC GASTRIC SLEEVE RESECTION N/A 03/30/2017   Procedure: LAPAROSCOPIC GASTRIC SLEEVE RESECTION, UPPER ENDO;  Surgeon: Greer Pickerel, MD;  Location: [facility];  Service: General;  Laterality: N/A;  . NASAL SINUS SURGERY  2004  . OVARIAN CYST REMOVAL  1998   A cyst on the fallopian tube removed  . SPINAL FUSION    . TONSILLECTOMY  1996    SOCIAL HISTORY: Social History   Socioeconomic History  . Marital status: Divorced    Spouse name: Not on file  . Number of children: 0  . Years of education: Not on file  . Highest education level: Not on file  Occupational History  . Occupation: disabled    Fish farm manager: UNEMPLOYED  Tobacco Use  . Smoking status: Never Smoker  . Smokeless tobacco: Never Used  Vaping Use  . Vaping Use: Never used  Substance and Sexual Activity  . Alcohol use: No  . Drug use: No  . Sexual activity: Yes    Birth control/protection: None  Other Topics Concern  . Not on file  Social History Narrative  . Not on file   Social Determinants of Health   Financial Resource Strain:   . Difficulty of Paying Living Expenses:   Food Insecurity:   . Worried About Charity fundraiser in the Last Year:   . Arboriculturist in the Last Year:   Transportation Needs:   . Film/video editor (Medical):   Marland Kitchen Lack of Transportation (Non-Medical):   Physical Activity:   . Days of Exercise per Week:   . Minutes of Exercise per Session:   Stress:   . Feeling of Stress :   Social Connections:   . Frequency of Communication with Friends and Family:   . Frequency of Social Gatherings with Friends and Family:   . Attends Religious Services:   . Active Member of Clubs or Organizations:   . Attends Archivist Meetings:   Marland Kitchen Marital Status:   Intimate Partner Violence:   . Fear of Current or Ex-Partner:   . Emotionally Abused:   Marland Kitchen Physically Abused:   . Sexually Abused:     FAMILY HISTORY: Family History  Problem Relation Age of  Onset  . Cancer Father        Leukemia  . Diabetes Father   . Diabetes Mother   . Cancer Maternal Grandmother        Stomach  . Colon cancer Maternal Grandmother   . Cancer Paternal Grandmother   . Cancer Paternal Aunt   . Diabetes Paternal Aunt   . Cancer Paternal Uncle   . Cancer Paternal Aunt   . Rectal cancer Neg Hx   . Stomach cancer Neg Hx   . Esophageal cancer Neg Hx  ALLERGIES:  is allergic to carbamazepine, gabapentin, pineapple, shellfish-derived products, erythromycin, latex, pregabalin, duloxetine, other, and pollen extract.  MEDICATIONS:  Current Outpatient Medications  Medication Sig Dispense Refill  . albuterol (PROAIR HFA) 108 (90 BASE) MCG/ACT inhaler Inhale 2 puffs into the lungs every 4 (four) hours as needed for wheezing or shortness of breath.    . ALPRAZolam (XANAX XR) 1 MG 24 hr tablet Take 1 mg by mouth in the morning and at bedtime.    . budesonide-formoterol (SYMBICORT) 160-4.5 MCG/ACT inhaler Inhale 2 puffs into the lungs 2 (two) times daily. (Patient taking differently: Inhale 2 puffs into the lungs 2 (two) times daily as needed (shortness of breath). ) 3 Inhaler 4  . buPROPion (WELLBUTRIN XL) 150 MG 24 hr tablet Take 150 mg by mouth daily with breakfast. Total daily dose=450mg     . buPROPion (WELLBUTRIN XL) 300 MG 24 hr tablet Take 300 mg by mouth daily with breakfast. Total daily dose=450mg     . dexlansoprazole (DEXILANT) 60 MG capsule Take 60 mg by mouth daily.    Marland Kitchen docusate sodium (COLACE) 100 MG capsule Take 100 mg by mouth at bedtime.     Marland Kitchen escitalopram (LEXAPRO) 20 MG tablet Take 1 tablet (20 mg total) by mouth daily with breakfast. 30 tablet 3  . estradiol (ESTRACE) 1 MG tablet Take 1 tablet (1 mg total) by mouth daily. 90 tablet 5  . fentaNYL (DURAGESIC - DOSED MCG/HR) 50 MCG/HR Place 50 mcg onto the skin every 3 (three) days.     . fluticasone (FLONASE) 50 MCG/ACT nasal spray INSTILL 1 SPRAY IN EACH NOSTRIL ONCE DAILY AS NEEDED FOR ALLERGIES  (Patient taking differently: Place 1 spray into both nostrils daily as needed for allergies. ) 16 mL 12  . HYDROmorphone (DILAUDID) 2 MG tablet Take 2 mg by mouth every 6 (six) hours.    . lidocaine (XYLOCAINE) 2 % jelly Apply 1 application topically as needed. 30 mL 2  . linaclotide (LINZESS) 290 MCG CAPS capsule Take 1 capsule (290 mcg total) by mouth daily before breakfast. 30 capsule 3  . medroxyPROGESTERone (PROVERA) 2.5 MG tablet Take 1 tablet (2.5 mg total) by mouth daily. 90 tablet 5  . montelukast (SINGULAIR) 10 MG tablet Take 10 mg by mouth at bedtime.    Marland Kitchen MOVANTIK 25 MG TABS tablet Take 25 mg by mouth at bedtime.     . Multiple Vitamins-Minerals (CELEBRATE MULTI-COMPLETE 36) CHEW Chew 1 tablet by mouth 2 (two) times daily.    . polyethylene glycol (MIRALAX / GLYCOLAX) 17 g packet Take 17 g by mouth daily as needed for mild constipation.    . polyethylene glycol (MIRALAX) 17 g packet Take 17 g by mouth daily. 14 each 0  . PRESCRIPTION MEDICATION Apply 1 application topically every 3 (three) days. Testosterone cream-compounded    . Sennosides (SENOKOT PO) Take 2 tablets by mouth at bedtime. 2 tablets at night     . temazepam (RESTORIL) 15 MG capsule Take 30 mg by mouth at bedtime.     Marland Kitchen tiZANidine (ZANAFLEX) 2 MG tablet Take 2 mg by mouth 3 (three) times daily as needed.    . traZODone (DESYREL) 100 MG tablet Take 300 mg by mouth at bedtime.    . valACYclovir (VALTREX) 1000 MG tablet TAKE 1 TABLET BY MOUTH EVERY DAY (Patient taking differently: Take 1,000 mg by mouth daily. ) 90 tablet 4   No current facility-administered medications for this visit.    REVIEW OF SYSTEMS:  Constitutional: ( - ) fevers, ( - )  chills , ( - ) night sweats Eyes: ( - ) blurriness of vision, ( - ) double vision, ( - ) watery eyes Ears, nose, mouth, throat, and face: ( - ) mucositis, ( - ) sore throat Respiratory: ( - ) cough, ( - ) dyspnea, ( - ) wheezes Cardiovascular: ( - ) palpitation, ( - ) chest  discomfort, ( - ) lower extremity swelling Gastrointestinal:  ( - ) nausea, ( - ) heartburn, ( + ) constipation. Skin: ( - ) abnormal skin rashes Lymphatics: ( - ) new lymphadenopathy, ( - ) easy bruising Neurological: ( - ) numbness, ( - ) tingling, ( - ) new weaknesses Behavioral/Psych: ( - ) mood change, ( - ) new changes  All other systems were reviewed with the patient and are negative.  PHYSICAL EXAMINATION: ECOG PERFORMANCE STATUS: 1 - Symptomatic but completely ambulatory  Vitals:   01/12/20 1415  BP: 136/72  Pulse: 75  Resp: 18  Temp: 97.7 F (36.5 C)  SpO2: 96%   Filed Weights   01/12/20 1415  Weight: 188 lb 14.4 oz (85.7 kg)    GENERAL: well appearing middle aged Caucasian female in NAD  SKIN: skin color, texture, turgor are normal, no rashes or significant lesions EYES: conjunctiva are pink and non-injected, sclera clear LUNGS: clear to auscultation and percussion with normal breathing effort HEART: regular rate & rhythm and no murmurs and no lower extremity edema Musculoskeletal: no cyanosis of digits and no clubbing  PSYCH: alert & oriented x 3, fluent speech NEURO: no focal motor/sensory deficits  LABORATORY DATA:  I have reviewed the data as listed CBC Latest Ref Rng & Units 01/12/2020 01/01/2020 05/16/2019  WBC 4.0 - 10.5 K/uL 7.4 8.0 7.5  Hemoglobin 12.0 - 15.0 g/dL 12.1 12.8 12.9  Hematocrit 36 - 46 % 38.8 41.1 40.1  Platelets 150 - 400 K/uL 240 284 312    CMP Latest Ref Rng & Units 01/12/2020 01/01/2020 05/16/2019  Glucose 70 - 99 mg/dL 83 162(H) 85  BUN 6 - 20 mg/dL 5(L) 6 7  Creatinine 0.44 - 1.00 mg/dL 0.81 0.85 0.93  Sodium 135 - 145 mmol/L 142 139 143  Potassium 3.5 - 5.1 mmol/L 4.0 3.6 5.0  Chloride 98 - 111 mmol/L 105 101 104  CO2 22 - 32 mmol/L 27 27 31   Calcium 8.9 - 10.3 mg/dL 9.5 9.1 10.0  Total Protein 6.5 - 8.1 g/dL 7.4 6.9 7.2  Total Bilirubin 0.3 - 1.2 mg/dL 0.4 0.5 0.4  Alkaline Phos 38 - 126 U/L 73 68 -  AST 15 - 41 U/L 19 19 14     ALT 0 - 44 U/L 13 14 7     RADIOGRAPHIC STUDIES: DG Abd 1 View  Result Date: 01/05/2020 CLINICAL DATA:  Constipation, left lower quadrant pain EXAM: ABDOMEN - 1 VIEW COMPARISON:  None. FINDINGS: The bowel gas pattern is normal. Scattered stool throughout the colon. No radio-opaque calculi or other significant radiographic abnormality are seen. IMPRESSION: Nonobstructive pattern of bowel gas. No radiographic findings to explain left lower quadrant pain. Electronically Signed   By: Eddie Candle M.D.   On: 01/05/2020 09:33    ASSESSMENT & PLAN [name] 54 y.o. 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  After review the labs, discussion with the patient, and review the outside records the  patient's findings are most consistent with an iron deficiency anemia secondary to poor GI absorption.  The patient underwent laparoscopic sleeve gastrectomy which is likely decreasing her oral absorption of iron.  Additionally she eats a relatively low protein diet and does not have a lot of foods rich in iron staples of her meals.  Weekly in cases of iron deficiency without anemia we do not recommend IV iron therapy, however in this patient's case it is clear she has an absorptive issue as well as low dietary intake of iron.  With this combination of issues it is only a matter time before the patient does become anemic and therefore I think it be reasonable in order to replete her iron stores IV.  The patient does also have issues with constipation which would preclude the use of p.o. iron therapy.  As such I would recommend we proceed with IV Feraheme 510 mg q 7 days x 2 doses.  We will have her return to clinic in 6 to 8 weeks after the last dose to assure that her ferritin levels are replete.  # Iron Deficiency without Anemia --normally IV iron  therapy is reserved for patients with anemia, but in this patient's case she has iron deficiency due to poor absorption and dietary modifications towards foods with decreased iron. I think she would be a good candidate for IV iron therapy to replete her stores --today will order CBC, CMP, iron panel, and ferritin. Additionally will collect a reticulocyte panel --will order vitamin b12 and folate levels to assure these are being absorbed as well.  --plan for IV feraheme 510mg  q 7days x 2 doses pending the above iron studies --will plan to have patient return to clinic about 6-8 weeks after last dose of IV iron to recheck iron stores.   Orders Placed This Encounter  Procedures  . CBC with Differential ([facility] Only)    Standing Status:   Future    Number of Occurrences:   1    Standing Expiration Date:   01/11/2021  . Retic Panel    Standing Status:   Future    Number of Occurrences:   1    Standing Expiration Date:   01/11/2021  . Save Smear ([facility])    Standing Status:   Future    Number of Occurrences:   1    Standing Expiration Date:   01/11/2021  . CMP ([place] only)    Standing Status:   Future    Number of Occurrences:   1    Standing Expiration Date:   01/11/2021  . Iron and TIBC    Standing Status:   Future    Number of Occurrences:   1    Standing Expiration Date:   01/11/2021  . Ferritin    Standing Status:   Future    Number of Occurrences:   1    Standing Expiration Date:   01/11/2021  . Vitamin B12    Standing Status:   Future    Number of Occurrences:   1    Standing Expiration Date:   01/11/2021  . Folate, Serum    Standing Status:   Future    Number of Occurrences:   1    Standing Expiration Date:   01/11/2021  . Methylmalonic acid, serum    Standing Status:   Future    Number of Occurrences:   1    Standing Expiration Date:   01/11/2021  . Homocysteine, serum  Standing Status:   Future    Number of Occurrences:   1    Standing Expiration Date:   01/11/2021     All questions were answered. The patient knows to call the clinic with any problems, questions or concerns.  A total of more than 40 minutes were spent on this encounter and over half of that time was spent on counseling and coordination of care as outlined above. This is a return patient encounter as she was previously seen by Dr. Lebron Conners in late 2018.   Ledell Peoples, MD Department of Hematology/Oncology [place] [facility] Phone: [phone] Pager: [phone] Email: [email or web address]  01/12/2020 4:15 PM

## 2020-01-12 ENCOUNTER — Inpatient Hospital Stay: Payer: Medicare HMO

## 2020-01-12 ENCOUNTER — Telehealth: Payer: Self-pay | Admitting: Physician Assistant

## 2020-01-12 ENCOUNTER — Encounter: Payer: Self-pay | Admitting: Hematology and Oncology

## 2020-01-12 ENCOUNTER — Inpatient Hospital Stay: Payer: Medicare HMO | Attending: Hematology and Oncology | Admitting: Hematology and Oncology

## 2020-01-12 ENCOUNTER — Other Ambulatory Visit: Payer: Self-pay

## 2020-01-12 VITALS — BP 136/72 | HR 75 | Temp 97.7°F | Resp 18 | Ht 67.0 in | Wt 188.9 lb

## 2020-01-12 DIAGNOSIS — D508 Other iron deficiency anemias: Secondary | ICD-10-CM

## 2020-01-12 DIAGNOSIS — F319 Bipolar disorder, unspecified: Secondary | ICD-10-CM | POA: Diagnosis not present

## 2020-01-12 DIAGNOSIS — I10 Essential (primary) hypertension: Secondary | ICD-10-CM | POA: Insufficient documentation

## 2020-01-12 DIAGNOSIS — E119 Type 2 diabetes mellitus without complications: Secondary | ICD-10-CM | POA: Insufficient documentation

## 2020-01-12 DIAGNOSIS — Z79899 Other long term (current) drug therapy: Secondary | ICD-10-CM | POA: Insufficient documentation

## 2020-01-12 DIAGNOSIS — E611 Iron deficiency: Secondary | ICD-10-CM | POA: Insufficient documentation

## 2020-01-12 DIAGNOSIS — R69 Illness, unspecified: Secondary | ICD-10-CM | POA: Diagnosis not present

## 2020-01-12 LAB — VITAMIN B12: Vitamin B-12: 516 pg/mL (ref 180–914)

## 2020-01-12 LAB — RETIC PANEL
Immature Retic Fract: 5.7 % (ref 2.3–15.9)
RBC.: 4.47 MIL/uL (ref 3.87–5.11)
Retic Count, Absolute: 23.2 10*3/uL (ref 19.0–186.0)
Retic Ct Pct: 0.5 % (ref 0.4–3.1)
Reticulocyte Hemoglobin: 32.5 pg (ref >27.9)

## 2020-01-12 LAB — CBC WITH DIFFERENTIAL (CANCER CENTER ONLY)
Abs Immature Granulocytes: 0.01 10*3/uL (ref 0.00–0.07)
Basophils Absolute: 0.1 10*3/uL (ref 0.0–0.1)
Basophils Relative: 1 %
Eosinophils Absolute: 0.2 10*3/uL (ref 0.0–0.5)
Eosinophils Relative: 3 %
HCT: 38.8 % (ref 36.0–46.0)
Hemoglobin: 12.1 g/dL (ref 12.0–15.0)
Immature Granulocytes: 0 %
Lymphocytes Relative: 22 %
Lymphs Abs: 1.6 10*3/uL (ref 0.7–4.0)
MCH: 27 pg (ref 26.0–34.0)
MCHC: 31.2 g/dL (ref 30.0–36.0)
MCV: 86.6 fL (ref 80.0–100.0)
Monocytes Absolute: 0.6 10*3/uL (ref 0.1–1.0)
Monocytes Relative: 8 %
Neutro Abs: 4.9 10*3/uL (ref 1.7–7.7)
Neutrophils Relative %: 66 %
Platelet Count: 240 10*3/uL (ref 150–400)
RBC: 4.48 MIL/uL (ref 3.87–5.11)
RDW: 14.6 % (ref 11.5–15.5)
WBC Count: 7.4 10*3/uL (ref 4.0–10.5)
nRBC: 0 % (ref 0.0–0.2)

## 2020-01-12 LAB — SAVE SMEAR(SSMR), FOR PROVIDER SLIDE REVIEW

## 2020-01-12 LAB — CMP (CANCER CENTER ONLY)
ALT: 13 U/L (ref 0–44)
AST: 19 U/L (ref 15–41)
Albumin: 4.1 g/dL (ref 3.5–5.0)
Alkaline Phosphatase: 73 U/L (ref 38–126)
Anion gap: 10 (ref 5–15)
BUN: 5 mg/dL — ABNORMAL LOW (ref 6–20)
CO2: 27 mmol/L (ref 22–32)
Calcium: 9.5 mg/dL (ref 8.9–10.3)
Chloride: 105 mmol/L (ref 98–111)
Creatinine: 0.81 mg/dL (ref 0.44–1.00)
GFR, Est AFR Am: >60 mL/min (ref >60)
GFR, Estimated: >60 mL/min (ref >60)
Glucose, Bld: 83 mg/dL (ref 70–99)
Potassium: 4 mmol/L (ref 3.5–5.1)
Sodium: 142 mmol/L (ref 135–145)
Total Bilirubin: 0.4 mg/dL (ref 0.3–1.2)
Total Protein: 7.4 g/dL (ref 6.5–8.1)

## 2020-01-12 LAB — FOLATE: Folate: 21.4 ng/mL (ref >5.9)

## 2020-01-12 NOTE — Telephone Encounter (Signed)
FYI see pt update

## 2020-01-12 NOTE — Telephone Encounter (Signed)
Severe constipation-any other recs?  Thanks-JLL

## 2020-01-12 NOTE — Telephone Encounter (Signed)
[name],  Well, that just depends. 1.  What dose of Linzess? 2.  How much MiraLAX  If she feels that Linzess helps, she should take this daily.  You could also increase the dose of Linzess if it is not the maximal dose of 290 mcg daily.  If a solo agent is not adequate, then add daily MiraLAX.  Obviously, you can titrate MiraLAX to as high a dose as needed to achieve the desired effect.  Try that.  Good luck!

## 2020-01-12 NOTE — Telephone Encounter (Signed)
Patient called with an update the colon cleansing pack but it did not clean her out then she took the Alcolu for about 3 days and she added Miralax and she finally got some relief.

## 2020-01-13 ENCOUNTER — Telehealth: Payer: Self-pay | Admitting: Hematology and Oncology

## 2020-01-13 LAB — IRON AND TIBC
Iron: 41 ug/dL (ref 41–142)
Saturation Ratios: 11 % — ABNORMAL LOW (ref 21–57)
TIBC: 375 ug/dL (ref 236–444)
UIBC: 334 ug/dL (ref 120–384)

## 2020-01-13 LAB — FERRITIN: Ferritin: 16 ng/mL (ref 11–307)

## 2020-01-13 LAB — HOMOCYSTEINE: Homocysteine: 12 umol/L (ref 0.0–14.5)

## 2020-01-13 NOTE — Telephone Encounter (Signed)
Ok- thanks for the update- make sure she is taking the Linzess 290 mcg qd and tell her she can take up to 4 doses of Miralax with that daily to achieve desired effect, we will try that for a while.  Thanks-JLL

## 2020-01-13 NOTE — Telephone Encounter (Signed)
Scheduled per los. Called and left msg. Mailed printout  °

## 2020-01-13 NOTE — Telephone Encounter (Signed)
The pt has been advised and confirmed she is taking linzess 290 mcg daily and will increase miralax as needed to achieve optimal results.

## 2020-01-15 LAB — METHYLMALONIC ACID, SERUM: Methylmalonic Acid, Quantitative: 222 nmol/L (ref 0–378)

## 2020-01-19 DIAGNOSIS — Z79891 Long term (current) use of opiate analgesic: Secondary | ICD-10-CM | POA: Diagnosis not present

## 2020-01-19 DIAGNOSIS — G894 Chronic pain syndrome: Secondary | ICD-10-CM | POA: Diagnosis not present

## 2020-01-19 DIAGNOSIS — M62830 Muscle spasm of back: Secondary | ICD-10-CM | POA: Diagnosis not present

## 2020-01-19 DIAGNOSIS — M461 Sacroiliitis, not elsewhere classified: Secondary | ICD-10-CM | POA: Diagnosis not present

## 2020-01-19 DIAGNOSIS — M47816 Spondylosis without myelopathy or radiculopathy, lumbar region: Secondary | ICD-10-CM | POA: Diagnosis not present

## 2020-01-19 DIAGNOSIS — R69 Illness, unspecified: Secondary | ICD-10-CM | POA: Diagnosis not present

## 2020-01-20 ENCOUNTER — Other Ambulatory Visit: Payer: Self-pay

## 2020-01-20 ENCOUNTER — Inpatient Hospital Stay: Payer: Medicare HMO

## 2020-01-20 VITALS — BP 127/59 | HR 68 | Temp 98.9°F | Resp 16

## 2020-01-20 DIAGNOSIS — D508 Other iron deficiency anemias: Secondary | ICD-10-CM

## 2020-01-20 DIAGNOSIS — E611 Iron deficiency: Secondary | ICD-10-CM | POA: Diagnosis not present

## 2020-01-20 MED ORDER — SODIUM CHLORIDE 0.9 % IV SOLN
Freq: Once | INTRAVENOUS | Status: AC
  Filled 2020-01-20: qty 250

## 2020-01-20 MED ORDER — SODIUM CHLORIDE 0.9 % IV SOLN
510.0000 mg | Freq: Once | INTRAVENOUS | Status: AC
  Administered 2020-01-20: 510 mg via INTRAVENOUS
  Filled 2020-01-20: qty 510

## 2020-01-20 NOTE — Patient Instructions (Signed)

## 2020-01-27 ENCOUNTER — Other Ambulatory Visit: Payer: Self-pay

## 2020-01-27 ENCOUNTER — Inpatient Hospital Stay: Payer: Medicare HMO

## 2020-01-27 VITALS — BP 112/60 | HR 62 | Temp 97.8°F | Resp 16

## 2020-01-27 DIAGNOSIS — E611 Iron deficiency: Secondary | ICD-10-CM | POA: Diagnosis not present

## 2020-01-27 DIAGNOSIS — D508 Other iron deficiency anemias: Secondary | ICD-10-CM

## 2020-01-27 MED ORDER — SODIUM CHLORIDE 0.9 % IV SOLN
510.0000 mg | Freq: Once | INTRAVENOUS | Status: AC
  Administered 2020-01-27: 510 mg via INTRAVENOUS
  Filled 2020-01-27: qty 510

## 2020-01-27 MED ORDER — SODIUM CHLORIDE 0.9 % IV SOLN
Freq: Once | INTRAVENOUS | Status: AC
  Filled 2020-01-27: qty 250

## 2020-01-27 NOTE — Patient Instructions (Signed)

## 2020-02-02 DIAGNOSIS — R69 Illness, unspecified: Secondary | ICD-10-CM | POA: Diagnosis not present

## 2020-02-03 ENCOUNTER — Telehealth: Payer: Self-pay | Admitting: Physician Assistant

## 2020-02-03 MED ORDER — LINACLOTIDE 290 MCG PO CAPS: 290.0000 ug | ORAL_CAPSULE | Freq: Every day | ORAL | 3 refills | Status: DC

## 2020-02-03 NOTE — Telephone Encounter (Signed)
Sent script into pharmacy.

## 2020-02-03 NOTE — Telephone Encounter (Signed)
Patient called requesting refill on Linzess 

## 2020-02-07 DIAGNOSIS — M62830 Muscle spasm of back: Secondary | ICD-10-CM | POA: Diagnosis not present

## 2020-02-07 DIAGNOSIS — M47816 Spondylosis without myelopathy or radiculopathy, lumbar region: Secondary | ICD-10-CM | POA: Diagnosis not present

## 2020-02-07 DIAGNOSIS — M461 Sacroiliitis, not elsewhere classified: Secondary | ICD-10-CM | POA: Diagnosis not present

## 2020-02-07 DIAGNOSIS — G894 Chronic pain syndrome: Secondary | ICD-10-CM | POA: Diagnosis not present

## 2020-02-08 ENCOUNTER — Encounter: Payer: Self-pay | Admitting: Internal Medicine

## 2020-02-08 ENCOUNTER — Ambulatory Visit: Payer: Medicare HMO | Admitting: Internal Medicine

## 2020-02-08 VITALS — BP 110/80 | HR 66 | Ht 66.5 in | Wt 188.4 lb

## 2020-02-08 DIAGNOSIS — K5909 Other constipation: Secondary | ICD-10-CM | POA: Diagnosis not present

## 2020-02-08 DIAGNOSIS — K219 Gastro-esophageal reflux disease without esophagitis: Secondary | ICD-10-CM

## 2020-02-08 MED ORDER — PANTOPRAZOLE SODIUM 40 MG PO TBEC
40.0000 mg | DELAYED_RELEASE_TABLET | Freq: Every day | ORAL | 11 refills | Status: DC
Start: 2020-02-08 — End: 2020-05-28

## 2020-02-08 NOTE — Patient Instructions (Signed)
If you are age 53 or older, your body mass index should be between 23-30. Your Body mass index is 29.95 kg/m. If this is out of the aforementioned range listed, please consider follow up with your Primary Care Provider.  If you are age 1 or younger, your body mass index should be between 19-25. Your Body mass index is 29.95 kg/m. If this is out of the aformentioned range listed, please consider follow up with your Primary Care Provider.   It was a pleasure to see you today!  Dr. Henrene Pastor

## 2020-02-09 ENCOUNTER — Encounter: Payer: Self-pay | Admitting: Internal Medicine

## 2020-02-09 DIAGNOSIS — R69 Illness, unspecified: Secondary | ICD-10-CM | POA: Diagnosis not present

## 2020-02-09 NOTE — Progress Notes (Signed)
HISTORY OF PRESENT ILLNESS:  [name] is a 54 y.o. female with multiple significant problems including chronic pain syndrome on chronic opioids, history of gastric sleeve type bariatric surgery, chronic constipation, and GERD who presents today regarding ongoing management of her chronic constipation and GERD.  Patient underwent colonoscopy and upper endoscopy April 21, 2018 to evaluate positive Cologuard testing and reflux symptoms respectively.  She had had prior colonoscopy in 2014 elsewhere, which was negative.  Her most recent exam revealed a diminutive colon polyp found to be a tubular adenoma and internal hemorrhoids.  Follow-up in 5 years recommended.  Upper endoscopy revealed normal postoperative anatomy and the patient had prior sleeve gastrectomy.  At 1 point, she was placed on Dexilant to control GERD symptoms.  This works well but is too expensive.  She was seen by the GI physician assistant June 2021 regarding her constipation related colics.  In addition to MiraLAX, 4 doses daily, she was prescribed Linzess 290 mcg daily.  This has been helpful.  However she cannot afford it at has enrolled in any pharmaceutical drug assistance program.  In terms of GERD, her Dexilant is working nicely.  To control symptoms.  In terms of her constipation, she tells me that prior to her current bowel regimen she was having a bowel movement once every 6 or 8 weeks.  She now tells me that she has a bowel movement once every 2 or 3 days.  Her bowel movements remain firm.  She had tried Movantik previously, but this did not help.  REVIEW OF SYSTEMS:  All non-GI ROS negative unless otherwise stated in the HPI except for allergies, anxiety, arthritis, back pain, depression, fatigue, headaches, sleeping problems  Past Medical History:  Diagnosis Date  . Allergy   . Anxiety   . Arthritis   . Asthma    History of Asthma  . Bipolar disorder ([place])   . Chest pain, atypical 11/30/2012  . Clotting disorder  ([place])   . Depression   . Diabetes mellitus without complication ([place])    type 2, pre-diabetic before she lost weight  . GERD (gastroesophageal reflux disease)   . H/O degenerative disc disease    L4-L5, L5-S1  . Heart murmur   . Hyperglycemia    Postoperative hyperglycemia  . Hypertension    hx of  . 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 Obesity 07/14/2012  . OSA (obstructive sleep apnea)    mild  . Pneumonia   . Polycystic disease, ovaries   . Postoperative anemia    2018 - while dieting  hx of  . Reflux   . Sinus tachycardia 07/14/2012   Pt says HR > 100 is consistent  . Sleep apnea    has had two tests, different results- no CPAP used    Past Surgical History:  Procedure Laterality Date  . COLONOSCOPY    . FOOT SURGERY     Right plantar facsiitis scrape  . KNEE SURGERY Bilateral 2007  . LAPAROSCOPIC GASTRIC SLEEVE RESECTION N/A 03/30/2017   Procedure: LAPAROSCOPIC GASTRIC SLEEVE RESECTION, UPPER ENDO;  Surgeon: Greer Pickerel, MD;  Location: [facility];  Service: General;  Laterality: N/A;  . NASAL SINUS SURGERY  2004  . OVARIAN CYST REMOVAL  1998   A cyst on the fallopian tube removed  . SPINAL FUSION    . TONSILLECTOMY  1996    Social History [name]  reports that she has  never smoked. She has never used smokeless tobacco. She reports that she does not drink alcohol and does not use drugs.  family history includes Cancer in her father, maternal grandmother, paternal aunt, paternal aunt, paternal grandmother, and paternal uncle; Colon cancer in her maternal grandmother; Diabetes in her father, mother, and paternal aunt.  Allergies  Allergen Reactions  . Carbamazepine Swelling    Rash and tongue swelling  . Gabapentin Nausea And Vomiting and Anaphylaxis  . Pineapple Shortness Of Breath and Swelling    Throat swells  . Shellfish-Derived Products Anaphylaxis  . Erythromycin Swelling  . Latex Rash    Hives  .  Pregabalin Swelling  . Duloxetine Other (See Comments)    Confusion/dizziness  . Other     Makes throat close  . Pollen Extract Other (See Comments)    Stuffy  Nose       PHYSICAL EXAMINATION: Vital signs: BP 110/80   Pulse 66   Ht 5' 6.5" (1.689 m)   Wt 188 lb 6 oz (85.4 kg)   LMP 08/22/2016 (Approximate)   BMI 29.95 kg/m   Constitutional: generally well-appearing, no acute distress Psychiatric: alert and oriented x3, cooperative Eyes: extraocular movements intact, anicteric, conjunctiva pink Mouth: oral pharynx moist, no lesions Neck: supple no lymphadenopathy Cardiovascular: heart regular rate and rhythm, no murmur Lungs: clear to auscultation bilaterally Abdomen: soft, nontender, nondistended, no obvious ascites, no peritoneal signs, normal bowel sounds, no organomegaly Rectal: Omitted Extremities: no clubbing, cyanosis, or lower extremity edema bilaterally Skin: no lesions on visible extremities Neuro: No focal deficits.  Cranial nerves intact  ASSESSMENT:  1.  Chronic GERD.  Symptoms controlled with PPI.  Needs more affordable alternative therapy. 2.  Status post gastric sleeve procedure 3.  History of diminutive adenoma October 2019 4.  Chronic constipation secondary to narcotics.  Improved with current bowel regimen as described   PLAN:  1.  Reflux precautions 2.  Prescribe pantoprazole 40 mg daily.  Medication risks reviewed 3.  Continue with MiraLAX and Linzess therapies.  I discussed with her how MiraLAX might be titrated to achieve desired effect.  She understands 4.  Surveillance colonoscopy around 2024 5.  Routine GI follow-up 1 year. Total time of 30 minutes was spent preparing to see the patient, reviewing tests and records.  Obtaining history and performing comprehensive physical exam.  Counseling the patient regarding her above listed issues.  Ordering medications and reviewing the risks.  Document clinical information in the health record

## 2020-02-15 DIAGNOSIS — R69 Illness, unspecified: Secondary | ICD-10-CM | POA: Diagnosis not present

## 2020-03-08 ENCOUNTER — Inpatient Hospital Stay: Payer: Medicare HMO

## 2020-03-08 ENCOUNTER — Inpatient Hospital Stay: Payer: Medicare HMO | Attending: Hematology and Oncology | Admitting: Hematology and Oncology

## 2020-03-08 ENCOUNTER — Other Ambulatory Visit: Payer: Self-pay | Admitting: Hematology and Oncology

## 2020-03-08 ENCOUNTER — Other Ambulatory Visit: Payer: Self-pay

## 2020-03-08 ENCOUNTER — Encounter: Payer: Self-pay | Admitting: Hematology and Oncology

## 2020-03-08 VITALS — BP 150/89 | HR 92 | Temp 97.8°F | Resp 18 | Ht 66.5 in | Wt 194.4 lb

## 2020-03-08 DIAGNOSIS — D508 Other iron deficiency anemias: Secondary | ICD-10-CM

## 2020-03-08 DIAGNOSIS — I10 Essential (primary) hypertension: Secondary | ICD-10-CM | POA: Diagnosis not present

## 2020-03-08 DIAGNOSIS — E611 Iron deficiency: Secondary | ICD-10-CM | POA: Insufficient documentation

## 2020-03-08 DIAGNOSIS — E282 Polycystic ovarian syndrome: Secondary | ICD-10-CM | POA: Diagnosis not present

## 2020-03-08 DIAGNOSIS — R69 Illness, unspecified: Secondary | ICD-10-CM | POA: Diagnosis not present

## 2020-03-08 DIAGNOSIS — Z79899 Other long term (current) drug therapy: Secondary | ICD-10-CM | POA: Diagnosis not present

## 2020-03-08 DIAGNOSIS — E119 Type 2 diabetes mellitus without complications: Secondary | ICD-10-CM | POA: Insufficient documentation

## 2020-03-08 LAB — RETIC PANEL
Immature Retic Fract: 5.1 % (ref 2.3–15.9)
RBC.: 4.75 MIL/uL (ref 3.87–5.11)
Retic Count, Absolute: 38.5 10*3/uL (ref 19.0–186.0)
Retic Ct Pct: 0.8 % (ref 0.4–3.1)
Reticulocyte Hemoglobin: 35.8 pg (ref >27.9)

## 2020-03-08 LAB — CMP (CANCER CENTER ONLY)
ALT: 11 U/L (ref 0–44)
AST: 15 U/L (ref 15–41)
Albumin: 3.9 g/dL (ref 3.5–5.0)
Alkaline Phosphatase: 74 U/L (ref 38–126)
Anion gap: 5 (ref 5–15)
BUN: 7 mg/dL (ref 6–20)
CO2: 33 mmol/L — ABNORMAL HIGH (ref 22–32)
Calcium: 10.3 mg/dL (ref 8.9–10.3)
Chloride: 105 mmol/L (ref 98–111)
Creatinine: 0.79 mg/dL (ref 0.44–1.00)
GFR, Est AFR Am: >60 mL/min (ref >60)
GFR, Estimated: >60 mL/min (ref >60)
Glucose, Bld: 85 mg/dL (ref 70–99)
Potassium: 4.1 mmol/L (ref 3.5–5.1)
Sodium: 143 mmol/L (ref 135–145)
Total Bilirubin: 0.5 mg/dL (ref 0.3–1.2)
Total Protein: 7.2 g/dL (ref 6.5–8.1)

## 2020-03-08 LAB — IRON AND TIBC
Iron: 79 ug/dL (ref 41–142)
Saturation Ratios: 28 % (ref 21–57)
TIBC: 281 ug/dL (ref 236–444)
UIBC: 202 ug/dL (ref 120–384)

## 2020-03-08 LAB — CBC WITH DIFFERENTIAL (CANCER CENTER ONLY)
Abs Immature Granulocytes: 0.02 10*3/uL (ref 0.00–0.07)
Basophils Absolute: 0.1 10*3/uL (ref 0.0–0.1)
Basophils Relative: 1 %
Eosinophils Absolute: 0.5 10*3/uL (ref 0.0–0.5)
Eosinophils Relative: 6 %
HCT: 43 % (ref 36.0–46.0)
Hemoglobin: 13.8 g/dL (ref 12.0–15.0)
Immature Granulocytes: 0 %
Lymphocytes Relative: 15 %
Lymphs Abs: 1.2 10*3/uL (ref 0.7–4.0)
MCH: 28.2 pg (ref 26.0–34.0)
MCHC: 32.1 g/dL (ref 30.0–36.0)
MCV: 87.9 fL (ref 80.0–100.0)
Monocytes Absolute: 0.7 10*3/uL (ref 0.1–1.0)
Monocytes Relative: 8 %
Neutro Abs: 5.7 10*3/uL (ref 1.7–7.7)
Neutrophils Relative %: 70 %
Platelet Count: 253 10*3/uL (ref 150–400)
RBC: 4.89 MIL/uL (ref 3.87–5.11)
RDW: 14.8 % (ref 11.5–15.5)
WBC Count: 8.2 10*3/uL (ref 4.0–10.5)
nRBC: 0 % (ref 0.0–0.2)

## 2020-03-08 LAB — FERRITIN: Ferritin: 153 ng/mL (ref 11–307)

## 2020-03-08 NOTE — Progress Notes (Signed)
[facility] Telephone:([phone]   [phone]  PROGRESS NOTE  Patient Care Team: Kristopher Glee., MD as PCP - General (Internal Medicine) Margaretha Sheffield, MD as Referring Physician (Physical Medicine and Rehabilitation) Norma Fredrickson, MD as Consulting Physician (Psychiatry)  Hematological/Oncological History # Iron Deficiency without Anemia 1) 01/01/2020: WBC 8.0, Hgb 12.8, MCV 86.9, Plt 284 2) 01/12/2020: establish care with Dr. Lorenso Courier  3) 7/16-7/23/2021:  2 doses of IV feraheme  Interval History:  [name] 54 y.o. 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 The patient's last visit was on 01/12/2020 at which time she established care. In the interim since the last visit she has received 2 doses of IV feraheme on 7/16-7/23/2021.    On exam today Ms. Burkel notes that she has had no improvement or increase in her energy since she received the IV iron therapy in July.  She notes that she continues to feel tired and can fall asleep while she is simply sitting down.  She also notes that she has had a long running headache starting in the back of her neck for at least 3 months time.  Her weight has dropped from 194 pounds down to 188 pounds.  In the interim she is also seen by GI he was prescribed for her Linzess and MiraLAX in order to help with her constipation.  She continues to have sharp left-sided abdominal pain without a clear explanation for these findings on CT scan.  She currently denies having any issues with fevers, chills, sweats, nausea, chest pain, or shortness of breath.  A full 10 point ROS is listed below.  MEDICAL HISTORY:  Past Medical History:  Diagnosis Date  . Allergy   . Anxiety   . Arthritis   . Asthma    History of Asthma  . Bipolar disorder ([place])   . Chest pain, atypical 11/30/2012  . Clotting disorder  ([place])   . Depression   . Diabetes mellitus without complication ([place])    type 2, pre-diabetic before she lost weight  . GERD (gastroesophageal reflux disease)   . H/O degenerative disc disease    L4-L5, L5-S1  . Heart murmur   . Hyperglycemia    Postoperative hyperglycemia  . Hypertension    hx of  . 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 Obesity 07/14/2012  . OSA (obstructive sleep apnea)    mild  . Pneumonia   . Polycystic disease, ovaries   . Postoperative anemia    2018 - while dieting  hx of  . Reflux   . Sinus tachycardia 07/14/2012   Pt says HR > 100 is consistent  . Sleep apnea    has had two tests, different results- no CPAP used    SURGICAL HISTORY: Past Surgical History:  Procedure Laterality Date  . COLONOSCOPY    . FOOT SURGERY     Right plantar facsiitis scrape  . KNEE SURGERY Bilateral 2007  . LAPAROSCOPIC GASTRIC SLEEVE RESECTION N/A 03/30/2017   Procedure: LAPAROSCOPIC GASTRIC SLEEVE RESECTION, UPPER ENDO;  Surgeon: Greer Pickerel, MD;  Location: [facility];  Service: General;  Laterality: N/A;  . NASAL SINUS SURGERY  2004  . OVARIAN CYST REMOVAL  1998   A cyst on the fallopian tube removed  . SPINAL FUSION    . TONSILLECTOMY  1996  SOCIAL HISTORY: Social History   Socioeconomic History  . Marital status: Divorced    Spouse name: Not on file  . Number of children: 0  . Years of education: Not on file  . Highest education level: Not on file  Occupational History  . Occupation: disabled    Fish farm manager: UNEMPLOYED  Tobacco Use  . Smoking status: Never Smoker  . Smokeless tobacco: Never Used  Vaping Use  . Vaping Use: Never used  Substance and Sexual Activity  . Alcohol use: No  . Drug use: No  . Sexual activity: Yes    Birth control/protection: None  Other Topics Concern  . Not on file  Social History Narrative  . Not on file   Social Determinants of Health   Financial Resource Strain:   .  Difficulty of Paying Living Expenses: Not on file  Food Insecurity:   . Worried About Charity fundraiser in the Last Year: Not on file  . Ran Out of Food in the Last Year: Not on file  Transportation Needs:   . Lack of Transportation (Medical): Not on file  . Lack of Transportation (Non-Medical): Not on file  Physical Activity:   . Days of Exercise per Week: Not on file  . Minutes of Exercise per Session: Not on file  Stress:   . Feeling of Stress : Not on file  Social Connections:   . Frequency of Communication with Friends and Family: Not on file  . Frequency of Social Gatherings with Friends and Family: Not on file  . Attends Religious Services: Not on file  . Active Member of Clubs or Organizations: Not on file  . Attends Archivist Meetings: Not on file  . Marital Status: Not on file  Intimate Partner Violence:   . Fear of Current or Ex-Partner: Not on file  . Emotionally Abused: Not on file  . Physically Abused: Not on file  . Sexually Abused: Not on file    FAMILY HISTORY: Family History  Problem Relation Age of Onset  . Cancer Father        Leukemia  . Diabetes Father   . Diabetes Mother   . Cancer Maternal Grandmother        Stomach  . Colon cancer Maternal Grandmother   . Cancer Paternal Grandmother   . Cancer Paternal Aunt   . Diabetes Paternal Aunt   . Cancer Paternal Uncle   . Cancer Paternal Aunt   . Rectal cancer Neg Hx   . Stomach cancer Neg Hx   . Esophageal cancer Neg Hx     ALLERGIES:  is allergic to carbamazepine, gabapentin, pineapple, shellfish-derived products, erythromycin, latex, pregabalin, duloxetine, other, and pollen extract.  MEDICATIONS:  Current Outpatient Medications  Medication Sig Dispense Refill  . montelukast (SINGULAIR) 10 MG tablet Take 1 tablet by mouth daily.    Marland Kitchen albuterol (PROAIR HFA) 108 (90 BASE) MCG/ACT inhaler Inhale 2 puffs into the lungs every 4 (four) hours as needed for wheezing or shortness of breath.     . ALPRAZolam (XANAX XR) 1 MG 24 hr tablet Take 1 mg by mouth in the morning and at bedtime.    . budesonide-formoterol (SYMBICORT) 160-4.5 MCG/ACT inhaler Inhale 2 puffs into the lungs 2 (two) times daily. (Patient taking differently: Inhale 2 puffs into the lungs 2 (two) times daily as needed (shortness of breath). ) 3 Inhaler 4  . buPROPion (WELLBUTRIN XL) 150 MG 24 hr tablet Take 150 mg by mouth daily with  breakfast. Total daily dose=450mg     . buPROPion (WELLBUTRIN XL) 300 MG 24 hr tablet Take 300 mg by mouth daily with breakfast. Total daily dose=450mg     . dexlansoprazole (DEXILANT) 60 MG capsule Take 60 mg by mouth daily.    Marland Kitchen docusate sodium (COLACE) 100 MG capsule Take 100 mg by mouth at bedtime.     Marland Kitchen escitalopram (LEXAPRO) 20 MG tablet Take 1 tablet (20 mg total) by mouth daily with breakfast. 30 tablet 3  . estradiol (ESTRACE) 1 MG tablet Take 1 tablet (1 mg total) by mouth daily. 90 tablet 5  . fentaNYL (DURAGESIC - DOSED MCG/HR) 50 MCG/HR Place 50 mcg onto the skin every 3 (three) days.     . fluticasone (FLONASE) 50 MCG/ACT nasal spray INSTILL 1 SPRAY IN EACH NOSTRIL ONCE DAILY AS NEEDED FOR ALLERGIES (Patient taking differently: Place 1 spray into both nostrils daily as needed for allergies. ) 16 mL 12  . HYDROmorphone (DILAUDID) 2 MG tablet Take 2 mg by mouth every 6 (six) hours.    . lidocaine (XYLOCAINE) 2 % jelly Apply 1 application topically as needed. 30 mL 2  . linaclotide (LINZESS) 290 MCG CAPS capsule Take 1 capsule (290 mcg total) by mouth daily before breakfast. 30 capsule 3  . medroxyPROGESTERone (PROVERA) 2.5 MG tablet Take 1 tablet (2.5 mg total) by mouth daily. 90 tablet 5  . montelukast (SINGULAIR) 10 MG tablet Take 10 mg by mouth at bedtime.    Marland Kitchen MOVANTIK 25 MG TABS tablet Take 25 mg by mouth at bedtime.     . Multiple Vitamins-Minerals (CELEBRATE MULTI-COMPLETE 36) CHEW Chew 1 tablet by mouth 2 (two) times daily.    . pantoprazole (PROTONIX) 40 MG tablet Take 1  tablet (40 mg total) by mouth daily. (Patient not taking: Reported on 03/08/2020) 30 tablet 11  . polyethylene glycol (MIRALAX / GLYCOLAX) 17 g packet Take 17 g by mouth daily as needed for mild constipation.    . polyethylene glycol (MIRALAX) 17 g packet Take 17 g by mouth daily. 14 each 0  . PRESCRIPTION MEDICATION Apply 1 application topically every 3 (three) days. Testosterone cream-compounded    . Sennosides (SENOKOT PO) Take 2 tablets by mouth at bedtime. 2 tablets at night     . temazepam (RESTORIL) 15 MG capsule Take 30 mg by mouth at bedtime.     . temazepam (RESTORIL) 30 MG capsule Take 30 mg by mouth at bedtime as needed.    Marland Kitchen tiZANidine (ZANAFLEX) 2 MG tablet Take 2 mg by mouth 3 (three) times daily as needed.    . traZODone (DESYREL) 100 MG tablet Take 300 mg by mouth at bedtime.    . valACYclovir (VALTREX) 1000 MG tablet TAKE 1 TABLET BY MOUTH EVERY DAY (Patient taking differently: Take 1,000 mg by mouth daily. ) 90 tablet 4   No current facility-administered medications for this visit.    REVIEW OF SYSTEMS:   Constitutional: ( - ) fevers, ( - )  chills , ( - ) night sweats Eyes: ( - ) blurriness of vision, ( - ) double vision, ( - ) watery eyes Ears, nose, mouth, throat, and face: ( - ) mucositis, ( - ) sore throat Respiratory: ( - ) cough, ( - ) dyspnea, ( - ) wheezes Cardiovascular: ( - ) palpitation, ( - ) chest discomfort, ( - ) lower extremity swelling Gastrointestinal:  ( - ) nausea, ( - ) heartburn, ( - ) change in bowel habits Skin: ( - )  abnormal skin rashes Lymphatics: ( - ) new lymphadenopathy, ( - ) easy bruising Neurological: ( - ) numbness, ( - ) tingling, ( - ) new weaknesses Behavioral/Psych: ( - ) mood change, ( - ) new changes  All other systems were reviewed with the patient and are negative.  PHYSICAL EXAMINATION: ECOG PERFORMANCE STATUS: 1 - Symptomatic but completely ambulatory  Vitals:   03/08/20 1117  BP: (!) 150/89  Pulse: 92  Resp: 18  Temp:  97.8 F (36.6 C)  SpO2: 97%   Filed Weights   03/08/20 1117  Weight: 194 lb 6.4 oz (88.2 kg)    GENERAL: well appearing middle aged Caucasian female, alert, no distress and comfortable SKIN: skin color, texture, turgor are normal, no rashes or significant lesions EYES: conjunctiva are pink and non-injected, sclera clear LUNGS: clear to auscultation and percussion with normal breathing effort HEART: regular rate & rhythm and no murmurs and no lower extremity edema Musculoskeletal: no cyanosis of digits and no clubbing  PSYCH: alert & oriented x 3, fluent speech NEURO: no focal motor/sensory deficits  LABORATORY DATA:  I have reviewed the data as listed CBC Latest Ref Rng & Units 03/08/2020 01/12/2020 01/01/2020  WBC 4.0 - 10.5 K/uL 8.2 7.4 8.0  Hemoglobin 12.0 - 15.0 g/dL 13.8 12.1 12.8  Hematocrit 36 - 46 % 43.0 38.8 41.1  Platelets 150 - 400 K/uL 253 240 284    CMP Latest Ref Rng & Units 03/08/2020 01/12/2020 01/01/2020  Glucose 70 - 99 mg/dL 85 83 162(H)  BUN 6 - 20 mg/dL 7 5(L) 6  Creatinine 0.44 - 1.00 mg/dL 0.79 0.81 0.85  Sodium 135 - 145 mmol/L 143 142 139  Potassium 3.5 - 5.1 mmol/L 4.1 4.0 3.6  Chloride 98 - 111 mmol/L 105 105 101  CO2 22 - 32 mmol/L 33(H) 27 27  Calcium 8.9 - 10.3 mg/dL 10.3 9.5 9.1  Total Protein 6.5 - 8.1 g/dL 7.2 7.4 6.9  Total Bilirubin 0.3 - 1.2 mg/dL 0.5 0.4 0.5  Alkaline Phos 38 - 126 U/L 74 73 68  AST 15 - 41 U/L 15 19 19   ALT 0 - 44 U/L 11 13 14     RADIOGRAPHIC STUDIES: No results found.  ASSESSMENT & PLAN KAMILIA CAROLLO 54 y.o. 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  In the interim Mrs. Rapaport has received her IV iron therapy, but unfortunately has had no improvement in her symptoms.  She has had a near two-point increase in her hemoglobin with the iron therapy, but unfortunately she has not felt any benefit  as result of this.  We did discuss prior to administration of IV iron therapy that treating iron deficiency without anemia is controversial and not a guarantee to improve symptoms.  At this time I would recommend that she continue to follow-up with her other physicians in order to help work-up her fatigue and other symptoms.  We will plan to see the patient back in approximately 6 months time to assure that her hemoglobin and iron levels remain in the normal range.  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  --normally IV iron therapy is reserved for patients with anemia, but in this patient's case she has iron deficiency due to poor absorption and dietary modifications towards foods with decreased iron. I think she is a good candidate for IV iron therapy to replete  her stores --today will repeat CBC, CMP, iron panel, and ferritin. Additionally will collect a reticulocyte panel -- vitamin b12 and folate levels are WNL --completed IV feraheme 510mg  q 7days x 2 doses 7/16-7/23/2021 --RTC in 6 months unless more iron repletion therapy is needed.   No orders of the defined types were placed in this encounter.  All questions were answered. The patient knows to call the clinic with any problems, questions or concerns.  A total of more than 30 minutes were spent on this encounter and over half of that time was spent on counseling and coordination of care as outlined above.   Ledell Peoples, MD Department of Hematology/Oncology [place] Phone: [phone] Pager: [phone] Email: [email or web address]  03/08/2020 1:11 PM

## 2020-03-13 ENCOUNTER — Other Ambulatory Visit: Payer: Self-pay | Admitting: Family Medicine

## 2020-03-13 ENCOUNTER — Telehealth: Payer: Self-pay | Admitting: Hematology and Oncology

## 2020-03-13 NOTE — Telephone Encounter (Signed)
Scheduled per los. Called and left msg. Mailed printout  °

## 2020-03-14 ENCOUNTER — Telehealth: Payer: Self-pay | Admitting: *Deleted

## 2020-03-14 NOTE — Telephone Encounter (Signed)
-----   Message from Orson Slick, MD sent at 03/08/2020 10:08 PM EDT ----- Please let [name] know that her labs look excellent. She has a normal CBC with replete iron. We do not have a clear explanation for her other symptoms. We will see her back in 6 months to reassess. ----- Message ----- From: Buel Ream, Lab In [place] Sent: 03/08/2020  11:11 AM EDT To: Orson Slick, MD

## 2020-03-14 NOTE — Telephone Encounter (Signed)
TCT patient regarding lab results from lat week. Spoke with her and advised that her labs look good, she has a normal CBC and a repleted iron study. Advised that we will see her back in 6 months for repeat labs. Ayasha voiced understanding.

## 2020-03-21 DIAGNOSIS — R69 Illness, unspecified: Secondary | ICD-10-CM | POA: Diagnosis not present

## 2020-04-03 NOTE — Progress Notes (Signed)
Cardiology Office Note   Date:  04/05/2020   ID:  [name], DOB Sep 22, 1965, MRN [id number]  PCP:  Kristopher Glee., MD  Cardiologist:  Peter Martinique, MD EP: None  Chief Complaint  Patient presents with  . Chest Pain  . Palpitations      History of Present Illness: [name] is a 54 y.o. female with a PMH of HTN, HLD, persistent tachycardia, PCOS, bipolar disorder, and morbid obesity s/p gastric sleeve in 2018, who presents for routine follow-up.  She was last evaluated by cardiology at an outpatient visit with Almyra Deforest, PA-C 02/2017 for preoperative assessment for upcoming gastric sleeve surgery. She had no anginal complaints and was cleared to proceed without additional cardiac work-up. She was recommended to follow-up in 1 year for further evaluation, however was lost to follow-up. Her last echocardiogram in 2014 showed EF 50-55%, no RWMA, mild LAE, and no significant valvular abnormalities. Her last ischemic evaluation was a NST in 2017 which showed EF 66% and one defect felt to be breast artifact without evidence of ischemia.   She presents today for routine follow-up. She reports increased life stressors over the past 1.5 years. Her father passed away from complications of an infection 08/2018 and she has been struggling with maintaining a peaceful relationship with her half-sister since that time. She reports her blood pressure is generally elevated when she goes for doctors appointments or checks it at [facility] with SBP generally in the 140s-160s. She recalls being on HCTZ in the past and tolerated this without any trouble; it was discontinued after she lost a bunch of weight. She reports having trouble with intermittent chest pain described as a sharp left sided pain which last ~30 seconds and frequently comes on when doing upper body activities (painting, lifting, etc). She does have some DOE when walking up hills. Also gets palpitations which are sometimes associated with  chest pain. Palpitations often occur with activity and last for 30 seconds before resolving spontaneously. These occur ~3x/wk. No complaints of dizziness, lightheadedness, syncope, LE edema, or orthopnea. She reports her cholesterol was elevated on her last check though she has never been on cholesterol medications. She does not smoke.      Past Medical History:  Diagnosis Date  . Allergy   . Anxiety   . Arthritis   . Asthma    History of Asthma  . Bipolar disorder ([place])   . Chest pain, atypical 11/30/2012  . Clotting disorder ([place])   . Depression   . Diabetes mellitus without complication ([place])    type 2, pre-diabetic before she lost weight  . GERD (gastroesophageal reflux disease)   . H/O degenerative disc disease    L4-L5, L5-S1  . Heart murmur   . Hyperglycemia    Postoperative hyperglycemia  . Hypertension    hx of  . 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 Obesity 07/14/2012  . OSA (obstructive sleep apnea)    mild  . Pneumonia   . Polycystic disease, ovaries   . Postoperative anemia    2018 - while dieting  hx of  . Reflux   . Sinus tachycardia 07/14/2012   Pt says HR > 100 is consistent  . Sleep apnea    has had two tests, different results- no CPAP used    Past Surgical History:  Procedure Laterality Date  . COLONOSCOPY    . FOOT SURGERY  Right plantar facsiitis scrape  . KNEE SURGERY Bilateral 2007  . LAPAROSCOPIC GASTRIC SLEEVE RESECTION N/A 03/30/2017   Procedure: LAPAROSCOPIC GASTRIC SLEEVE RESECTION, UPPER ENDO;  Surgeon: Greer Pickerel, MD;  Location: [facility];  Service: General;  Laterality: N/A;  . NASAL SINUS SURGERY  2004  . OVARIAN CYST REMOVAL  1998   A cyst on the fallopian tube removed  . SPINAL FUSION    . TONSILLECTOMY  1996     Current Outpatient Medications  Medication Sig Dispense Refill  . albuterol (PROAIR HFA) 108 (90 BASE) MCG/ACT inhaler Inhale 2 puffs into the lungs every 4 (four) hours  as needed for wheezing or shortness of breath.    . ALPRAZolam (XANAX XR) 1 MG 24 hr tablet Take 1 mg by mouth in the morning and at bedtime.    . budesonide-formoterol (SYMBICORT) 160-4.5 MCG/ACT inhaler Inhale 2 puffs into the lungs 2 (two) times daily. (Patient taking differently: Inhale 2 puffs into the lungs 2 (two) times daily as needed (shortness of breath). ) 3 Inhaler 4  . buPROPion (WELLBUTRIN XL) 150 MG 24 hr tablet Take 150 mg by mouth daily with breakfast. Total daily dose=450mg     . buPROPion (WELLBUTRIN XL) 300 MG 24 hr tablet Take 300 mg by mouth daily with breakfast. Total daily dose=450mg     . dexlansoprazole (DEXILANT) 60 MG capsule Take 60 mg by mouth daily.    Marland Kitchen docusate sodium (COLACE) 100 MG capsule Take 100 mg by mouth at bedtime.     Marland Kitchen escitalopram (LEXAPRO) 20 MG tablet Take 1 tablet (20 mg total) by mouth daily with breakfast. 30 tablet 3  . estradiol (ESTRACE) 1 MG tablet Take 1 tablet (1 mg total) by mouth daily. 90 tablet 5  . fentaNYL (DURAGESIC - DOSED MCG/HR) 50 MCG/HR Place 50 mcg onto the skin every 3 (three) days.     . fluticasone (FLONASE) 50 MCG/ACT nasal spray INSTILL 1 SPRAY IN EACH NOSTRIL ONCE DAILY AS NEEDED FOR ALLERGIES (Patient taking differently: Place 1 spray into both nostrils daily as needed for allergies. ) 16 mL 12  . HYDROmorphone (DILAUDID) 2 MG tablet Take 2 mg by mouth every 6 (six) hours.    . lidocaine (XYLOCAINE) 2 % jelly Apply 1 application topically as needed. 30 mL 2  . linaclotide (LINZESS) 290 MCG CAPS capsule Take 1 capsule (290 mcg total) by mouth daily before breakfast. 30 capsule 3  . medroxyPROGESTERone (PROVERA) 2.5 MG tablet Take 1 tablet (2.5 mg total) by mouth daily. 90 tablet 5  . montelukast (SINGULAIR) 10 MG tablet Take 10 mg by mouth at bedtime.    . montelukast (SINGULAIR) 10 MG tablet Take 1 tablet by mouth daily.    Marland Kitchen MOVANTIK 25 MG TABS tablet Take 25 mg by mouth at bedtime.     . Multiple Vitamins-Minerals  (CELEBRATE MULTI-COMPLETE 36) CHEW Chew 1 tablet by mouth 2 (two) times daily.    . pantoprazole (PROTONIX) 40 MG tablet Take 1 tablet (40 mg total) by mouth daily. 30 tablet 11  . polyethylene glycol (MIRALAX / GLYCOLAX) 17 g packet Take 17 g by mouth daily as needed for mild constipation.    . polyethylene glycol (MIRALAX) 17 g packet Take 17 g by mouth daily. 14 each 0  . PRESCRIPTION MEDICATION Apply 1 application topically every 3 (three) days. Testosterone cream-compounded    . Sennosides (SENOKOT PO) Take 2 tablets by mouth at bedtime. 2 tablets at night     . temazepam (  RESTORIL) 15 MG capsule Take 30 mg by mouth at bedtime.     . temazepam (RESTORIL) 30 MG capsule Take 30 mg by mouth at bedtime as needed.    Marland Kitchen tiZANidine (ZANAFLEX) 2 MG tablet Take 2 mg by mouth 3 (three) times daily as needed.    . traZODone (DESYREL) 100 MG tablet Take 300 mg by mouth at bedtime.    . valACYclovir (VALTREX) 1000 MG tablet TAKE 1 TABLET BY MOUTH EVERY DAY (Patient taking differently: Take 1,000 mg by mouth daily. ) 90 tablet 4  . atorvastatin (LIPITOR) 40 MG tablet Take 1 tablet (40 mg total) by mouth daily. 90 tablet 1  . hydrochlorothiazide (HYDRODIURIL) 25 MG tablet Take 1 tablet (25 mg total) by mouth daily. 90 tablet 1  . metoprolol tartrate (LOPRESSOR) 100 MG tablet Take 1 tablet by mouth 2 hours prior to your CTA 1 tablet 0   No current facility-administered medications for this visit.    Allergies:   Carbamazepine, Gabapentin, Pineapple, Shellfish-derived products, Erythromycin, Latex, Pregabalin, Duloxetine, Other, and Pollen extract    Social History:  The patient  reports that she has never smoked. She has never used smokeless tobacco. She reports that she does not drink alcohol and does not use drugs.   Family History:  The patient's family history includes Cancer in her father, maternal grandmother, paternal aunt, paternal aunt, paternal grandmother, and paternal uncle; Colon cancer in  her maternal grandmother; Diabetes in her father, mother, and paternal aunt; Heart disease in her father.    ROS:  Please see the history of present illness.   Otherwise, review of systems are positive for none.   All other systems are reviewed and negative.    PHYSICAL EXAM: VS:  BP (!) 140/96   Pulse 70   Ht 5' 6.5" (1.689 m)   Wt 190 lb (86.2 kg)   LMP 08/22/2016 (Approximate)   BMI 30.21 kg/m  , BMI Body mass index is 30.21 kg/m. GEN: Well nourished, well developed, in no acute distress HEENT: sclera anicteric Neck: no JVD, carotid bruits, or masses Cardiac: RRR; no murmurs, rubs, or gallops, no edema  Respiratory:  clear to auscultation bilaterally, normal work of breathing GI: soft, nontender, nondistended, + BS MS: no deformity or atrophy Skin: warm and dry, no rash Neuro:  Strength and sensation are intact Psych: euthymic mood, full affect   EKG:  EKG is ordered today. The ekg ordered today demonstrates sinus rhythm with rate 70bpm, no STE/D, no TWI, no significant change from previous   Recent Labs: 05/16/2019: TSH 1.10 03/08/2020: ALT 11; BUN 7; Creatinine 0.79; Hemoglobin 13.8; Platelet Count 253; Potassium 4.1; Sodium 143    Lipid Panel    Component Value Date/Time   CHOL 220 (H) 05/16/2019 1107   TRIG 65 05/16/2019 1107   HDL 61 05/16/2019 1107   CHOLHDL 3.6 05/16/2019 1107   VLDL 21 03/03/2016 1215   LDLCALC 143 (H) 05/16/2019 1107      Wt Readings from Last 3 Encounters:  04/05/20 190 lb (86.2 kg)  03/08/20 194 lb 6.4 oz (88.2 kg)  02/08/20 188 lb 6 oz (85.4 kg)      Other studies Reviewed: Additional studies/ records that were reviewed today include:   NST 2017:  Nuclear stress EF: 66%. Normal LV function  There was no ST segment deviation noted during stress.  Defect 1: There is a medium defect present in the basal anteroseptal, basal inferoseptal, mid anteroseptal, mid inferoseptal and apex location.  This is most consistent with breast  artifact and apical thinning .  This is a low risk study.  Echocardiogram 2014: - Left ventricle: The cavity size was normal. Wall thickness  was normal. Systolic function was normal. The estimated  ejection fraction was in the range of 50% to 55%. Wall  motion was normal; there were no regional wall motion  abnormalities.  - Left atrium: The atrium was mildly dilated.    ASSESSMENT AND PLAN:  1. Chest pain: patient describes atypical chest pain, though does have several risk factors for CAD including HTN, HLD, pre-diabetes, and family history of CAD (Dad had first PCI in his 63s.).  - Will check a coronary CTA to further evaluate her coronary artery anatomy  2. Palpitations: she describes episodes of palpitations lasting ~30 seconds several times per week which are associated with chest pain - Will check a 1 week zio patch   3. HTN: BP 140/96 today - Will restart HCTZ 25mg  daily - Encourage patient to pick up a BP cuff for home monitoring and to notify the office if persistently >130/80 after 2-4 weeks back on HCTZ.  4. HLD: LDL 143 05/2019. Not on cholesterol medicine - Will start atorvastatin 40mg  daily - Will coordinate repeat FLP/LFTs at follow-up visit in ~1 month  5. Tachycardia: HR 70 today. She has been off BBlockers for several years now - Will check zio patch as above  6. Morbid obesity s/p gastric sleeve: She reports she's lost 100lbs since her surgery! - Continue to encourage healthy dietary/lifestyle modifications to promote weight loss    Current medicines are reviewed at length with the patient today.  The patient does not have concerns regarding medicines.  The following changes have been made:  As above  Labs/ tests ordered today include:   Orders Placed This Encounter  Procedures  . CT CORONARY MORPH W/CTA COR W/SCORE W/CA W/CM &/OR WO/CM  . CT CORONARY FRACTIONAL FLOW RESERVE DATA PREP  . CT CORONARY FRACTIONAL FLOW RESERVE FLUID ANALYSIS    . Basic metabolic panel  . LONG TERM MONITOR (3-14 DAYS)  . EKG 12-Lead     Disposition:   FU with me in 1 month following above work-up  Signed, Abigail Butts, PA-C  04/05/2020 3:42 PM

## 2020-04-05 ENCOUNTER — Ambulatory Visit (INDEPENDENT_AMBULATORY_CARE_PROVIDER_SITE_OTHER): Payer: Medicare HMO | Admitting: Medical

## 2020-04-05 ENCOUNTER — Encounter: Payer: Self-pay | Admitting: Medical

## 2020-04-05 ENCOUNTER — Other Ambulatory Visit: Payer: Self-pay

## 2020-04-05 VITALS — BP 140/96 | HR 70 | Ht 66.5 in | Wt 190.0 lb

## 2020-04-05 DIAGNOSIS — I10 Essential (primary) hypertension: Secondary | ICD-10-CM

## 2020-04-05 DIAGNOSIS — R072 Precordial pain: Secondary | ICD-10-CM

## 2020-04-05 DIAGNOSIS — R002 Palpitations: Secondary | ICD-10-CM | POA: Diagnosis not present

## 2020-04-05 DIAGNOSIS — M62830 Muscle spasm of back: Secondary | ICD-10-CM | POA: Diagnosis not present

## 2020-04-05 DIAGNOSIS — E785 Hyperlipidemia, unspecified: Secondary | ICD-10-CM | POA: Diagnosis not present

## 2020-04-05 DIAGNOSIS — R69 Illness, unspecified: Secondary | ICD-10-CM | POA: Diagnosis not present

## 2020-04-05 DIAGNOSIS — M461 Sacroiliitis, not elsewhere classified: Secondary | ICD-10-CM | POA: Diagnosis not present

## 2020-04-05 DIAGNOSIS — M47816 Spondylosis without myelopathy or radiculopathy, lumbar region: Secondary | ICD-10-CM | POA: Diagnosis not present

## 2020-04-05 DIAGNOSIS — G894 Chronic pain syndrome: Secondary | ICD-10-CM | POA: Diagnosis not present

## 2020-04-05 MED ORDER — HYDROCHLOROTHIAZIDE 25 MG PO TABS: 25.0000 mg | ORAL_TABLET | Freq: Every day | ORAL | 1 refills | Status: DC

## 2020-04-05 MED ORDER — ATORVASTATIN CALCIUM 40 MG PO TABS: 40.0000 mg | ORAL_TABLET | Freq: Every day | ORAL | 1 refills | Status: DC

## 2020-04-05 MED ORDER — METOPROLOL TARTRATE 100 MG PO TABS: ORAL_TABLET | ORAL | 0 refills | Status: DC

## 2020-04-05 NOTE — Patient Instructions (Signed)
Medication Instructions:   START Hydrochlorothiazide (HCTZ) 25 mg daily  START Atorvastatin 40 mg daily.  *If you need a refill on your cardiac medications before your next appointment, please call your pharmacy*   Lab Work: Your physician recommends that you return for lab work 1 week before your scheduled CTA.  If you have labs (blood work) drawn today and your tests are completely normal, you will receive your results only by: Marland Kitchen MyChart Message (if you have MyChart) OR . A paper copy in the mail If you have any lab test that is abnormal or we need to change your treatment, we will call you to review the results.   Testing/Procedures:  Your cardiac CT will be scheduled at one of the below locations:   [facility] [address] [place], [place] [address] [phone]  [place] [address] [place],  [address] [phone]  If scheduled at [facility], please arrive at the [facility] main entrance of [facility] 30 minutes prior to test start time. Proceed to the [facility] Radiology Department (first floor) to check-in and test prep.  If scheduled at [facility], please arrive 15 mins early for check-in and test prep.  Please follow these instructions carefully (unless otherwise directed):   On the Night Before the Test: . Be sure to Drink plenty of water. . Do not consume any caffeinated/decaffeinated beverages or chocolate 12 hours prior to your test. . Do not take any antihistamines 12 hours prior to your test.  On the Day of the Test: . Drink plenty of water. Do not drink any water within one hour of the test. . Do not eat any food 4 hours prior to the test. . You may take your regular medications prior to the test.  . Take metoprolol (Lopressor) two hours prior to test. . HOLD Hydrochlorothiazide morning of the test. . FEMALES- please wear  underwire-free bra if available       After the Test: . Drink plenty of water. . After receiving IV contrast, you may experience a mild flushed feeling. This is normal. . On occasion, you may experience a mild rash up to 24 hours after the test. This is not dangerous. If this occurs, you can take Benadryl 25 mg and increase your fluid intake. . If you experience trouble breathing, this can be serious. If it is severe call 911 IMMEDIATELY. If it is mild, please call our office. . If you take any of these medications: Glipizide/Metformin, Avandament, Glucavance, please do not take 48 hours after completing test unless otherwise instructed.   Once we have confirmed authorization from your insurance company, we will call you to set up a date and time for your test. Based on how quickly your insurance processes prior authorizations requests, please allow up to 4 weeks to be contacted for scheduling your Cardiac CT appointment. Be advised that routine Cardiac CT appointments could be scheduled as many as 8 weeks after your provider has ordered it.  For non-scheduling related questions, please contact the cardiac imaging nurse navigator should you have any questions/concerns: Marchia Bond, Cardiac Imaging Nurse Navigator Burley Saver, Interim Cardiac Imaging Nurse [facility] Direct Office Dial: [phone]   For scheduling needs, including cancellations and rescheduling, please call Vivien Rota at [phone], option 3.    Follow-Up: At [facility], you and your health needs are our priority.  As part  of our continuing mission to provide you with exceptional heart care, we have created designated Provider Care Teams.  These Care Teams include your primary Cardiologist (physician) and Advanced Practice Providers (APPs -  Physician Assistants and Nurse Practitioners) who all work together to provide you with the care you need, when you need it.  We recommend signing  up for the patient portal called "MyChart".  Sign up information is provided on this After Visit Summary.  MyChart is used to connect with patients for Virtual Visits (Telemedicine).  Patients are able to view lab/test results, encounter notes, upcoming appointments, etc.  Non-urgent messages can be sent to your provider as well.   To learn more about what you can do with MyChart, go to [email or web address].    Your next appointment:   After testing is completed  The format for your next appointment:   In Person  Provider:   Roby Lofts, PA-C   Other Instructions Your physician has requested that you regularly monitor and record your blood pressure readings at home. Please use the same machine at the same time of day to check your readings and record them to bring to your follow-up visit. Please call our office if your blood pressure is staying consistently above 130/80.    Low-Sodium Eating Plan Sodium, which is an element that makes up salt, helps you maintain a healthy balance of fluids in your body. Too much sodium can increase your blood pressure and cause fluid and waste to be held in your body. Your health care provider or dietitian may recommend following this plan if you have high blood pressure (hypertension), kidney disease, liver disease, or heart failure. Eating less sodium can help lower your blood pressure, reduce swelling, and protect your heart, liver, and kidneys. What are tips for following this plan? General guidelines  Most people on this plan should limit their sodium intake to 1,500-2,000 mg (milligrams) of sodium each day. Reading food labels   The Nutrition Facts label lists the amount of sodium in one serving of the food. If you eat more than one serving, you must multiply the listed amount of sodium by the number of servings.  Choose foods with less than 140 mg of sodium per serving.  Avoid foods with 300 mg of sodium or more per  serving. Shopping  Look for lower-sodium products, often labeled as "low-sodium" or "no salt added."  Always check the sodium content even if foods are labeled as "unsalted" or "no salt added".  Buy fresh foods. ? Avoid canned foods and premade or frozen meals. ? Avoid canned, cured, or processed meats  Buy breads that have less than 80 mg of sodium per slice. Cooking  Eat more home-cooked food and less restaurant, buffet, and fast food.  Avoid adding salt when cooking. Use salt-free seasonings or herbs instead of table salt or sea salt. Check with your health care provider or pharmacist before using salt substitutes.  Cook with plant-based oils, such as canola, sunflower, or olive oil. Meal planning  When eating at a restaurant, ask that your food be prepared with less salt or no salt, if possible.  Avoid foods that contain MSG (monosodium glutamate). MSG is sometimes added to [place] food, bouillon, and some canned foods. What foods are recommended? The items listed may not be a complete list. Talk with your dietitian about what dietary choices are best for you. Grains Low-sodium cereals, including oats, puffed wheat and rice, and shredded wheat. Low-sodium crackers. Unsalted rice. Unsalted  pasta. Low-sodium bread. Whole-grain breads and whole-grain pasta. Vegetables Fresh or frozen vegetables. "No salt added" canned vegetables. "No salt added" tomato sauce and paste. Low-sodium or reduced-sodium tomato and vegetable juice. Fruits Fresh, frozen, or canned fruit. Fruit juice. Meats and other protein foods Fresh or frozen (no salt added) meat, poultry, seafood, and fish. Low-sodium canned tuna and salmon. Unsalted nuts. Dried peas, beans, and lentils without added salt. Unsalted canned beans. Eggs. Unsalted nut butters. Dairy Milk. Soy milk. Cheese that is naturally low in sodium, such as ricotta cheese, fresh mozzarella, or Swiss cheese Low-sodium or reduced-sodium cheese. Cream  cheese. Yogurt. Fats and oils Unsalted butter. Unsalted margarine with no trans fat. Vegetable oils such as canola or olive oils. Seasonings and other foods Fresh and dried herbs and spices. Salt-free seasonings. Low-sodium mustard and ketchup. Sodium-free salad dressing. Sodium-free light mayonnaise. Fresh or refrigerated horseradish. Lemon juice. Vinegar. Homemade, reduced-sodium, or low-sodium soups. Unsalted popcorn and pretzels. Low-salt or salt-free chips. What foods are not recommended? The items listed may not be a complete list. Talk with your dietitian about what dietary choices are best for you. Grains Instant hot cereals. Bread stuffing, pancake, and biscuit mixes. Croutons. Seasoned rice or pasta mixes. Noodle soup cups. Boxed or frozen macaroni and cheese. Regular salted crackers. Self-rising flour. Vegetables Sauerkraut, pickled vegetables, and relishes. Olives. [place] fries. Onion rings. Regular canned vegetables (not low-sodium or reduced-sodium). Regular canned tomato sauce and paste (not low-sodium or reduced-sodium). Regular tomato and vegetable juice (not low-sodium or reduced-sodium). Frozen vegetables in sauces. Meats and other protein foods Meat or fish that is salted, canned, smoked, spiced, or pickled. Bacon, ham, sausage, hotdogs, corned beef, chipped beef, packaged lunch meats, salt pork, jerky, pickled herring, anchovies, regular canned tuna, sardines, salted nuts. Dairy Processed cheese and cheese spreads. Cheese curds. Blue cheese. Feta cheese. String cheese. Regular cottage cheese. Buttermilk. Canned milk. Fats and oils Salted butter. Regular margarine. Ghee. Bacon fat. Seasonings and other foods Onion salt, garlic salt, seasoned salt, table salt, and sea salt. Canned and packaged gravies. Worcestershire sauce. Tartar sauce. Barbecue sauce. Teriyaki sauce. Soy sauce, including reduced-sodium. Steak sauce. Fish sauce. Oyster sauce. Cocktail sauce. Horseradish that you  find on the shelf. Regular ketchup and mustard. Meat flavorings and tenderizers. Bouillon cubes. Hot sauce and Tabasco sauce. Premade or packaged marinades. Premade or packaged taco seasonings. Relishes. Regular salad dressings. Salsa. Potato and tortilla chips. Corn chips and puffs. Salted popcorn and pretzels. Canned or dried soups. Pizza. Frozen entrees and pot pies. Summary  Eating less sodium can help lower your blood pressure, reduce swelling, and protect your heart, liver, and kidneys.  Most people on this plan should limit their sodium intake to 1,500-2,000 mg (milligrams) of sodium each day.  Canned, boxed, and frozen foods are high in sodium. Restaurant foods, fast foods, and pizza are also very high in sodium. You also get sodium by adding salt to food.  Try to cook at home, eat more fresh fruits and vegetables, and eat less fast food, canned, processed, or prepared foods. This information is not intended to replace advice given to you by your health care provider. Make sure you discuss any questions you have with your health care provider. Document Revised: 06/05/2017 Document Reviewed: 06/16/2016 Elsevier Patient Education  2020 [employer].

## 2020-04-06 DIAGNOSIS — R69 Illness, unspecified: Secondary | ICD-10-CM | POA: Diagnosis not present

## 2020-04-09 ENCOUNTER — Ambulatory Visit (INDEPENDENT_AMBULATORY_CARE_PROVIDER_SITE_OTHER): Payer: Medicare HMO

## 2020-04-09 DIAGNOSIS — R002 Palpitations: Secondary | ICD-10-CM

## 2020-04-09 DIAGNOSIS — R072 Precordial pain: Secondary | ICD-10-CM | POA: Diagnosis not present

## 2020-04-11 DIAGNOSIS — R072 Precordial pain: Secondary | ICD-10-CM | POA: Diagnosis not present

## 2020-04-11 DIAGNOSIS — R69 Illness, unspecified: Secondary | ICD-10-CM | POA: Diagnosis not present

## 2020-04-11 DIAGNOSIS — R002 Palpitations: Secondary | ICD-10-CM | POA: Diagnosis not present

## 2020-04-11 LAB — BASIC METABOLIC PANEL
BUN/Creatinine Ratio: 11 (ref 9–23)
BUN: 10 mg/dL (ref 6–24)
CO2: 29 mmol/L (ref 20–29)
Calcium: 10.2 mg/dL (ref 8.7–10.2)
Chloride: 98 mmol/L (ref 96–106)
Creatinine, Ser: 0.87 mg/dL (ref 0.57–1.00)
GFR calc Af Amer: 88 mL/min/{1.73_m2} (ref >59)
GFR calc non Af Amer: 76 mL/min/{1.73_m2} (ref >59)
Glucose: 86 mg/dL (ref 65–99)
Potassium: 4.8 mmol/L (ref 3.5–5.2)
Sodium: 140 mmol/L (ref 134–144)

## 2020-04-12 DIAGNOSIS — Z0000 Encounter for general adult medical examination without abnormal findings: Secondary | ICD-10-CM | POA: Diagnosis not present

## 2020-04-12 DIAGNOSIS — N951 Menopausal and female climacteric states: Secondary | ICD-10-CM | POA: Diagnosis not present

## 2020-04-12 DIAGNOSIS — N9412 Deep dyspareunia: Secondary | ICD-10-CM | POA: Diagnosis not present

## 2020-04-12 DIAGNOSIS — N9411 Superficial (introital) dyspareunia: Secondary | ICD-10-CM | POA: Diagnosis not present

## 2020-04-12 DIAGNOSIS — Z01419 Encounter for gynecological examination (general) (routine) without abnormal findings: Secondary | ICD-10-CM | POA: Diagnosis not present

## 2020-04-12 DIAGNOSIS — Z79890 Hormone replacement therapy (postmenopausal): Secondary | ICD-10-CM | POA: Diagnosis not present

## 2020-04-16 ENCOUNTER — Telehealth (HOSPITAL_COMMUNITY): Payer: Self-pay | Admitting: Emergency Medicine

## 2020-04-16 NOTE — Telephone Encounter (Signed)
Reaching out to patient to offer assistance regarding upcoming cardiac imaging study; pt verbalizes understanding of appt date/time, parking situation and where to check in, pre-test NPO status and medications ordered, and verified current allergies; name and call back number provided for further questions should they arise Marchia Bond RN Navigator Cardiac Imaging [facility] [phone] office [phone] cell   Pt instructed to hold flonase and HCTZ, pt will take metoprolol 2 hr prior to scan Clarise Cruz

## 2020-04-18 ENCOUNTER — Other Ambulatory Visit: Payer: Self-pay

## 2020-04-18 ENCOUNTER — Ambulatory Visit (HOSPITAL_COMMUNITY)
Admission: RE | Admit: 2020-04-18 | Discharge: 2020-04-18 | Disposition: A | Discharge status: Home / Self Care | Payer: Medicare HMO | Source: Ambulatory Visit | Attending: Medical | Admitting: Medical

## 2020-04-18 ENCOUNTER — Encounter (HOSPITAL_COMMUNITY): Payer: Self-pay

## 2020-04-18 DIAGNOSIS — R072 Precordial pain: Secondary | ICD-10-CM | POA: Diagnosis not present

## 2020-04-18 DIAGNOSIS — R002 Palpitations: Secondary | ICD-10-CM

## 2020-04-18 MED ORDER — NITROGLYCERIN 0.4 MG SL SUBL
SUBLINGUAL_TABLET | SUBLINGUAL | Status: AC
  Filled 2020-04-18: qty 2

## 2020-04-18 MED ORDER — NITROGLYCERIN 0.4 MG SL SUBL
0.8000 mg | SUBLINGUAL_TABLET | Freq: Once | SUBLINGUAL | Status: AC
  Administered 2020-04-18: 0.8 mg via SUBLINGUAL

## 2020-04-18 MED ORDER — IOHEXOL 350 MG/ML SOLN
80.0000 mL | Freq: Once | INTRAVENOUS | Status: AC | PRN
  Administered 2020-04-18: 80 mL via INTRAVENOUS

## 2020-04-18 NOTE — Progress Notes (Signed)
Patient tolerated CT well. States did get at headache 6/10 throbbing. Drank a soda ate cookies after. Headache improved 4/10 throbbing. Ambulated to exit steady gait.

## 2020-04-25 DIAGNOSIS — Z79890 Hormone replacement therapy (postmenopausal): Secondary | ICD-10-CM | POA: Diagnosis not present

## 2020-04-25 DIAGNOSIS — N951 Menopausal and female climacteric states: Secondary | ICD-10-CM | POA: Diagnosis not present

## 2020-04-25 DIAGNOSIS — N9411 Superficial (introital) dyspareunia: Secondary | ICD-10-CM | POA: Diagnosis not present

## 2020-04-25 DIAGNOSIS — N9412 Deep dyspareunia: Secondary | ICD-10-CM | POA: Diagnosis not present

## 2020-04-25 DIAGNOSIS — R69 Illness, unspecified: Secondary | ICD-10-CM | POA: Diagnosis not present

## 2020-04-27 DIAGNOSIS — R002 Palpitations: Secondary | ICD-10-CM | POA: Diagnosis not present

## 2020-04-27 DIAGNOSIS — R072 Precordial pain: Secondary | ICD-10-CM | POA: Diagnosis not present

## 2020-05-03 DIAGNOSIS — M461 Sacroiliitis, not elsewhere classified: Secondary | ICD-10-CM | POA: Diagnosis not present

## 2020-05-17 DIAGNOSIS — R69 Illness, unspecified: Secondary | ICD-10-CM | POA: Diagnosis not present

## 2020-05-24 DIAGNOSIS — R69 Illness, unspecified: Secondary | ICD-10-CM | POA: Diagnosis not present

## 2020-05-27 NOTE — Progress Notes (Signed)
Virtual Visit via Telephone Note   This visit type was conducted due to national recommendations for restrictions regarding the COVID-19 Pandemic (e.g. social distancing) in an effort to limit this patient's exposure and mitigate transmission in our community.  Due to her co-morbid illnesses, this patient is at least at moderate risk for complications without adequate follow up.  This format is felt to be most appropriate for this patient at this time.  The patient did not have access to video technology/had technical difficulties with video requiring transitioning to audio format only (telephone).  All issues noted in this document were discussed and addressed.  No physical exam could be performed with this format.  Please refer to the patient's chart for her  consent to telehealth for [facility].    Date:  05/27/2020   ID:  [name], DOB 1965-07-10, MRN [id number] The patient was identified using 2 identifiers.  Patient Location: Home Provider Location: Home Office  PCP:  Kristopher Glee., MD  Cardiologist:  Peter Martinique, MD  Electrophysiologist:  None   Evaluation Performed:  Follow-Up Visit  Chief Complaint:  Follow-up chest pain and palpitations  History of Present Illness:    [name] is a 54 y.o. female with a PMH of HTN, HLD, persistent tachycardia, PCOS, bipolar disorder, and morbid obesity s/p gastric sleeve in 2018, who presents for follow-up of her chest pain and palpitations.  She was last seen by cardiology at an outpatient visit with myself 04/05/20, at which time she reported intermittent chest pain, Doe, and palpitations. She underwent a coronary CTA to further evaluate chest pain which showed a calcium score of 0 with normal coronary arteries. She had a 7 day zio patch monitor to further evaluate her palpitations which showed rare PACs/PVCs, though otherwise NSR with triggered events correlating with NSR. Additionally, her blood pressure was elevated and  she was restarted on HCTZ 25mg  daily, as well as atorvastatin 40mg  daily for management of HLD (LDL 143 05/2019).   She presents today for 2 month follow-up. She has been doing well from a cardiac standpoint since her last visit. She was very reassured by her recent cardiac work-up, though reports she did not experience any palpitations while wearing her cardiac monitor. She tells me her mother was recently diagnosed with atrial fibrillation. She was unable to tolerate atorvastatin due to leg pains  Additionally she reported blood pressure was particularly high at her last visit because of a heated conversation that took place just prior to her visit. She reports BP has been in the 100s-120s/60s-80s at her last several office visits and home checks. She reports stopping the HCTZ ~2 weeks ago and BP's have remained the same. No complaints of chest pain, palpitations, dizziness, lightheadedness, or syncope. She states her allergies are always bad this time of year which causes some SOB, though this is stable for her.   The patient does not have symptoms concerning for COVID-19 infection (fever, chills, cough, or new shortness of breath).     Past Medical History:  Diagnosis Date  . Allergy   . Anxiety   . Arthritis   . Asthma    History of Asthma  . Bipolar disorder ([place])   . Chest pain, atypical 11/30/2012  . Clotting disorder ([place])   . Depression   . Diabetes mellitus without complication ([place])    type 2, pre-diabetic before she lost weight  . GERD (gastroesophageal reflux disease)   . H/O degenerative disc disease  L4-L5, L5-S1  . Heart murmur   . Hyperglycemia    Postoperative hyperglycemia  . Hypertension    hx of  . 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 Obesity 07/14/2012  . OSA (obstructive sleep apnea)    mild  . Pneumonia   . Polycystic disease, ovaries   . Postoperative anemia    2018 - while dieting  hx of  . Reflux   . Sinus  tachycardia 07/14/2012   Pt says HR > 100 is consistent  . Sleep apnea    has had two tests, different results- no CPAP used   Past Surgical History:  Procedure Laterality Date  . COLONOSCOPY    . FOOT SURGERY     Right plantar facsiitis scrape  . KNEE SURGERY Bilateral 2007  . LAPAROSCOPIC GASTRIC SLEEVE RESECTION N/A 03/30/2017   Procedure: LAPAROSCOPIC GASTRIC SLEEVE RESECTION, UPPER ENDO;  Surgeon: Greer Pickerel, MD;  Location: [facility];  Service: General;  Laterality: N/A;  . NASAL SINUS SURGERY  2004  . OVARIAN CYST REMOVAL  1998   A cyst on the fallopian tube removed  . SPINAL FUSION    . TONSILLECTOMY  1996     No outpatient medications have been marked as taking for the 05/28/20 encounter (Appointment) with Abigail Butts., PA-C.     Allergies:   Carbamazepine, Gabapentin, Pineapple, Shellfish-derived products, Erythromycin, Latex, Pregabalin, Duloxetine, Other, and Pollen extract   Social History   Tobacco Use  . Smoking status: Never Smoker  . Smokeless tobacco: Never Used  Vaping Use  . Vaping Use: Never used  Substance Use Topics  . Alcohol use: No  . Drug use: No     Family Hx: The patient's family history includes Cancer in her father, maternal grandmother, paternal aunt, paternal aunt, paternal grandmother, and paternal uncle; Colon cancer in her maternal grandmother; Diabetes in her father, mother, and paternal aunt; Heart disease in her father. There is no history of Rectal cancer, Stomach cancer, or Esophageal cancer.  ROS:   Please see the history of present illness.     All other systems reviewed and are negative.   Prior CV studies:   The following studies were reviewed today:  Zio patch monitor 04/30/20:  Normal sinus rhythm  Very rare PACs  Very rare PVCs  Patients triggered events demonstrate NSR   Coronary CTA 04/18/20: 1. No evidence of CAD, CADRADS = 0.  2. Coronary calcium score of 0.  3. Normal coronary origin with right  dominance.  Labs/Other Tests and Data Reviewed:    EKG:  No ECG reviewed.  Recent Labs: 03/08/2020: ALT 11; Hemoglobin 13.8; Platelet Count 253 04/11/2020: BUN 10; Creatinine, Ser 0.87; Potassium 4.8; Sodium 140   Recent Lipid Panel Lab Results  Component Value Date/Time   CHOL 220 (H) 05/16/2019 11:07 AM   TRIG 65 05/16/2019 11:07 AM   HDL 61 05/16/2019 11:07 AM   CHOLHDL 3.6 05/16/2019 11:07 AM   LDLCALC 143 (H) 05/16/2019 11:07 AM    Wt Readings from Last 3 Encounters:  04/05/20 190 lb (86.2 kg)  03/08/20 194 lb 6.4 oz (88.2 kg)  02/08/20 188 lb 6 oz (85.4 kg)     Risk Assessment/Calculations:      Objective:    Vital Signs:  LMP 08/22/2016 (Approximate)    VITAL SIGNS:  reviewed GEN:  no acute distress RESPIRATORY:  speaking in full sentences without SOB CARDIOVASCULAR:  no peripheral edema  NEURO:  A&O x3 PSYCH:  normal affect  ASSESSMENT & PLAN:    1. Chest pain: Coronary CTA without evidence of CAD - Continue risk factor modifications to prevent CAD - goal BP <130/80, goal LDL <100, and goal A1C <7  2. Palpitations: 7 day zio patch monitor was reassuring - rate PACs/PVCs, otherwise NSR with triggered events correlating with NSR, though she states she did not experience her typical palpitations while wearing the monitor. We discussed using her watch to monitor her HR - she states it can also capture EKGs. She will send in any concerning rhythm strips if they occur.   - Continue to monitor   3. HTN: BP 111/73 today. She has been off HCTZ for the past 2 weeks after BP was frequently in the 100s-110s/60s, though she was not having any dizziness or lightheadedness - Continue to monitor for now  4. HLD: LDL 143 05/2019. Started on atorvastatin 40mg  daily at her last visit though did not tolerate due to leg pains. Goal LDL <100 given lack of CAD on coronary CTA. Unlikely to achieve LDL goal with diet alone given limited po intake following gastric sleeve - Will  trial zetia 10mg  daily for improved cholesterol  5. Tachycardia: HR 97 today. She has been off BBlockers for several years now. No evidence of sustained tachycardia on her recent zio patch, though she states she did not have any palpitations at that time.  - Continue to monitor  6. Morbid obesity s/p gastric sleeve: She reports she's lost 100lbs since her surgery - Continue to encourage healthy dietary/lifestyle modifications to promote weight loss   Shared Decision Making/Informed Consent        COVID-19 Education: The signs and symptoms of COVID-19 were discussed with the patient and how to seek care for testing (follow up with PCP or arrange E-visit).  The importance of social distancing was discussed today.  Time:   Today, I have spent 9 minutes with the patient with telehealth technology discussing the above problems.     Medication Adjustments/Labs and Tests Ordered: Current medicines are reviewed at length with the patient today.  Concerns regarding medicines are outlined above.   Tests Ordered: No orders of the defined types were placed in this encounter.   Medication Changes: No orders of the defined types were placed in this encounter.   Follow Up:  In Person in 1 year(s)  Signed, Abigail Butts, PA-C  05/27/2020 7:49 PM    [place]

## 2020-05-28 ENCOUNTER — Telehealth (INDEPENDENT_AMBULATORY_CARE_PROVIDER_SITE_OTHER): Payer: Medicare HMO | Admitting: Medical

## 2020-05-28 ENCOUNTER — Telehealth: Payer: Self-pay

## 2020-05-28 ENCOUNTER — Encounter: Payer: Self-pay | Admitting: Medical

## 2020-05-28 VITALS — BP 111/73 | HR 97 | Ht 66.0 in | Wt 181.0 lb

## 2020-05-28 DIAGNOSIS — R079 Chest pain, unspecified: Secondary | ICD-10-CM

## 2020-05-28 DIAGNOSIS — I10 Essential (primary) hypertension: Secondary | ICD-10-CM

## 2020-05-28 DIAGNOSIS — R002 Palpitations: Secondary | ICD-10-CM

## 2020-05-28 DIAGNOSIS — E785 Hyperlipidemia, unspecified: Secondary | ICD-10-CM | POA: Diagnosis not present

## 2020-05-28 MED ORDER — EZETIMIBE 10 MG PO TABS: 10.0000 mg | ORAL_TABLET | Freq: Every day | ORAL | 3 refills | Status: DC

## 2020-05-28 NOTE — Telephone Encounter (Signed)
Call Ms. Nodal to go over AVS Summary.

## 2020-05-28 NOTE — Telephone Encounter (Signed)
Ms. nanna, ertle are scheduled for a virtual visit with your provider today.    Just as we do with appointments in the office, we must obtain your consent to participate.  Your consent will be active for this visit and any virtual visit you may have with one of our providers in the next 365 days.    If you have a MyChart account, I can also send a copy of this consent to you electronically.  All virtual visits are billed to your insurance company just like a traditional visit in the office.  As this is a virtual visit, video technology does not allow for your provider to perform a traditional examination.  This may limit your provider's ability to fully assess your condition.  If your provider identifies any concerns that need to be evaluated in person or the need to arrange testing such as labs, EKG, etc, we will make arrangements to do so.    Although advances in technology are sophisticated, we cannot ensure that it will always work on either your end or our end.  If the connection with a video visit is poor, we may have to switch to a telephone visit.  With either a video or telephone visit, we are not always able to ensure that we have a secure connection.   I need to obtain your verbal consent now.   Are you willing to proceed with your visit today?   [name] has provided verbal consent on 05/28/2020 for a virtual visit (video or telephone).   Monia Pouch, CMA 05/28/2020  1:39 PM

## 2020-05-28 NOTE — Patient Instructions (Signed)
Medication Instructions:  Start Zetia 10 mg ( 1 Tablet Daily) *If you need a refill on your cardiac medications before your next appointment, please call your pharmacy*   Lab Work: No Labs If you have labs (blood work) drawn today and your tests are completely normal, you will receive your results only by: Marland Kitchen MyChart Message (if you have MyChart) OR . A paper copy in the mail If you have any lab test that is abnormal or we need to change your treatment, we will call you to review the results.   Testing/Procedures: No Testing Ordered   Follow-Up: At [facility], you and your health needs are our priority.  As part of our continuing mission to provide you with exceptional heart care, we have created designated Provider Care Teams.  These Care Teams include your primary Cardiologist (physician) and Advanced Practice Providers (APPs -  Physician Assistants and Nurse Practitioners) who all work together to provide you with the care you need, when you need it.  Your next appointment:   1 year  The format for your next appointment:   In Person  Provider:   Peter Martinique, MD or Roby Lofts PA-C

## 2020-06-04 ENCOUNTER — Other Ambulatory Visit (HOSPITAL_COMMUNITY)
Admission: RE | Admit: 2020-06-04 | Discharge: 2020-06-04 | Disposition: A | Discharge status: Home / Self Care | Payer: Medicare HMO | Source: Ambulatory Visit | Attending: Physical Medicine and Rehabilitation | Admitting: Physical Medicine and Rehabilitation

## 2020-06-04 DIAGNOSIS — M2550 Pain in unspecified joint: Secondary | ICD-10-CM | POA: Diagnosis not present

## 2020-06-04 DIAGNOSIS — M461 Sacroiliitis, not elsewhere classified: Secondary | ICD-10-CM | POA: Diagnosis not present

## 2020-06-04 DIAGNOSIS — M62830 Muscle spasm of back: Secondary | ICD-10-CM | POA: Diagnosis not present

## 2020-06-04 DIAGNOSIS — G894 Chronic pain syndrome: Secondary | ICD-10-CM | POA: Diagnosis not present

## 2020-06-04 DIAGNOSIS — M47816 Spondylosis without myelopathy or radiculopathy, lumbar region: Secondary | ICD-10-CM | POA: Diagnosis not present

## 2020-06-04 LAB — SEDIMENTATION RATE: Sed Rate: 20 mm/hr (ref 0–22)

## 2020-06-04 LAB — C-REACTIVE PROTEIN: CRP: 1.3 mg/dL — ABNORMAL HIGH (ref <1.0)

## 2020-06-05 LAB — ANA: Anti Nuclear Antibody (ANA): NEGATIVE

## 2020-06-05 LAB — RHEUMATOID FACTOR: Rheumatoid fact SerPl-aCnc: <10 IU/mL (ref <14.0)

## 2020-06-07 DIAGNOSIS — Z23 Encounter for immunization: Secondary | ICD-10-CM | POA: Diagnosis not present

## 2020-06-07 DIAGNOSIS — Z0184 Encounter for antibody response examination: Secondary | ICD-10-CM | POA: Diagnosis not present

## 2020-06-07 DIAGNOSIS — L739 Follicular disorder, unspecified: Secondary | ICD-10-CM | POA: Diagnosis not present

## 2020-06-07 DIAGNOSIS — E669 Obesity, unspecified: Secondary | ICD-10-CM | POA: Diagnosis not present

## 2020-06-07 DIAGNOSIS — J4540 Moderate persistent asthma, uncomplicated: Secondary | ICD-10-CM | POA: Diagnosis not present

## 2020-06-07 DIAGNOSIS — E785 Hyperlipidemia, unspecified: Secondary | ICD-10-CM | POA: Diagnosis not present

## 2020-06-13 DIAGNOSIS — R69 Illness, unspecified: Secondary | ICD-10-CM | POA: Diagnosis not present

## 2020-06-21 DIAGNOSIS — R69 Illness, unspecified: Secondary | ICD-10-CM | POA: Diagnosis not present

## 2020-06-25 DIAGNOSIS — Z1231 Encounter for screening mammogram for malignant neoplasm of breast: Secondary | ICD-10-CM | POA: Diagnosis not present

## 2020-06-25 DIAGNOSIS — R69 Illness, unspecified: Secondary | ICD-10-CM | POA: Diagnosis not present

## 2020-06-27 DIAGNOSIS — L02212 Cutaneous abscess of back [any part, except buttock]: Secondary | ICD-10-CM | POA: Diagnosis not present

## 2020-06-27 DIAGNOSIS — R300 Dysuria: Secondary | ICD-10-CM | POA: Diagnosis not present

## 2020-07-02 DIAGNOSIS — M461 Sacroiliitis, not elsewhere classified: Secondary | ICD-10-CM | POA: Diagnosis not present

## 2020-07-02 DIAGNOSIS — M47816 Spondylosis without myelopathy or radiculopathy, lumbar region: Secondary | ICD-10-CM | POA: Diagnosis not present

## 2020-07-02 DIAGNOSIS — G894 Chronic pain syndrome: Secondary | ICD-10-CM | POA: Diagnosis not present

## 2020-07-02 DIAGNOSIS — M62830 Muscle spasm of back: Secondary | ICD-10-CM | POA: Diagnosis not present

## 2020-07-02 DIAGNOSIS — Z79891 Long term (current) use of opiate analgesic: Secondary | ICD-10-CM | POA: Diagnosis not present

## 2020-07-11 DIAGNOSIS — R69 Illness, unspecified: Secondary | ICD-10-CM | POA: Diagnosis not present

## 2020-07-13 DIAGNOSIS — R590 Localized enlarged lymph nodes: Secondary | ICD-10-CM | POA: Insufficient documentation

## 2020-07-13 DIAGNOSIS — L723 Sebaceous cyst: Secondary | ICD-10-CM | POA: Insufficient documentation

## 2020-07-25 DIAGNOSIS — R69 Illness, unspecified: Secondary | ICD-10-CM | POA: Diagnosis not present

## 2020-07-27 DIAGNOSIS — G894 Chronic pain syndrome: Secondary | ICD-10-CM | POA: Diagnosis not present

## 2020-07-27 DIAGNOSIS — M47816 Spondylosis without myelopathy or radiculopathy, lumbar region: Secondary | ICD-10-CM | POA: Diagnosis not present

## 2020-07-27 DIAGNOSIS — M62830 Muscle spasm of back: Secondary | ICD-10-CM | POA: Diagnosis not present

## 2020-07-27 DIAGNOSIS — M461 Sacroiliitis, not elsewhere classified: Secondary | ICD-10-CM | POA: Diagnosis not present

## 2020-08-07 DIAGNOSIS — R69 Illness, unspecified: Secondary | ICD-10-CM | POA: Diagnosis not present

## 2020-08-08 DIAGNOSIS — M3500 Sicca syndrome, unspecified: Secondary | ICD-10-CM | POA: Diagnosis not present

## 2020-08-08 DIAGNOSIS — M542 Cervicalgia: Secondary | ICD-10-CM | POA: Diagnosis not present

## 2020-08-08 DIAGNOSIS — E669 Obesity, unspecified: Secondary | ICD-10-CM | POA: Diagnosis not present

## 2020-08-08 DIAGNOSIS — Z6830 Body mass index (BMI) 30.0-30.9, adult: Secondary | ICD-10-CM | POA: Diagnosis not present

## 2020-08-08 DIAGNOSIS — M2550 Pain in unspecified joint: Secondary | ICD-10-CM | POA: Diagnosis not present

## 2020-08-08 DIAGNOSIS — R5383 Other fatigue: Secondary | ICD-10-CM | POA: Diagnosis not present

## 2020-08-08 DIAGNOSIS — G894 Chronic pain syndrome: Secondary | ICD-10-CM | POA: Diagnosis not present

## 2020-08-08 DIAGNOSIS — M150 Primary generalized (osteo)arthritis: Secondary | ICD-10-CM | POA: Diagnosis not present

## 2020-08-28 DIAGNOSIS — M62830 Muscle spasm of back: Secondary | ICD-10-CM | POA: Diagnosis not present

## 2020-08-28 DIAGNOSIS — M1711 Unilateral primary osteoarthritis, right knee: Secondary | ICD-10-CM | POA: Diagnosis not present

## 2020-08-28 DIAGNOSIS — R69 Illness, unspecified: Secondary | ICD-10-CM | POA: Diagnosis not present

## 2020-08-28 DIAGNOSIS — G894 Chronic pain syndrome: Secondary | ICD-10-CM | POA: Diagnosis not present

## 2020-08-28 DIAGNOSIS — M47816 Spondylosis without myelopathy or radiculopathy, lumbar region: Secondary | ICD-10-CM | POA: Diagnosis not present

## 2020-09-03 ENCOUNTER — Other Ambulatory Visit: Payer: Self-pay | Admitting: Medical

## 2020-09-04 ENCOUNTER — Other Ambulatory Visit: Payer: Self-pay | Admitting: Hematology and Oncology

## 2020-09-04 DIAGNOSIS — D508 Other iron deficiency anemias: Secondary | ICD-10-CM

## 2020-09-04 NOTE — Progress Notes (Signed)
No show

## 2020-09-05 ENCOUNTER — Other Ambulatory Visit: Payer: Medicare HMO

## 2020-09-05 ENCOUNTER — Encounter: Payer: Medicare HMO | Admitting: Hematology and Oncology

## 2020-09-05 DIAGNOSIS — D508 Other iron deficiency anemias: Secondary | ICD-10-CM

## 2020-09-10 ENCOUNTER — Telehealth: Payer: Self-pay | Admitting: Hematology and Oncology

## 2020-09-10 DIAGNOSIS — M2550 Pain in unspecified joint: Secondary | ICD-10-CM | POA: Diagnosis not present

## 2020-09-10 DIAGNOSIS — G894 Chronic pain syndrome: Secondary | ICD-10-CM | POA: Diagnosis not present

## 2020-09-10 DIAGNOSIS — E663 Overweight: Secondary | ICD-10-CM | POA: Diagnosis not present

## 2020-09-10 DIAGNOSIS — Z6829 Body mass index (BMI) 29.0-29.9, adult: Secondary | ICD-10-CM | POA: Diagnosis not present

## 2020-09-10 DIAGNOSIS — M150 Primary generalized (osteo)arthritis: Secondary | ICD-10-CM | POA: Diagnosis not present

## 2020-09-10 DIAGNOSIS — M3500 Sicca syndrome, unspecified: Secondary | ICD-10-CM | POA: Diagnosis not present

## 2020-09-10 NOTE — Telephone Encounter (Signed)
Scheduled appointments per 03/06 schedule message. Contacted patient, patient is aware.

## 2020-09-11 DIAGNOSIS — R69 Illness, unspecified: Secondary | ICD-10-CM | POA: Diagnosis not present

## 2020-09-13 ENCOUNTER — Other Ambulatory Visit: Payer: Self-pay | Admitting: Medical

## 2020-09-13 NOTE — Telephone Encounter (Signed)
This is Dr. Jordan's pt. °

## 2020-09-19 DIAGNOSIS — R69 Illness, unspecified: Secondary | ICD-10-CM | POA: Diagnosis not present

## 2020-09-25 DIAGNOSIS — M1711 Unilateral primary osteoarthritis, right knee: Secondary | ICD-10-CM | POA: Diagnosis not present

## 2020-09-25 DIAGNOSIS — G894 Chronic pain syndrome: Secondary | ICD-10-CM | POA: Diagnosis not present

## 2020-09-25 DIAGNOSIS — M47816 Spondylosis without myelopathy or radiculopathy, lumbar region: Secondary | ICD-10-CM | POA: Diagnosis not present

## 2020-09-25 DIAGNOSIS — M62830 Muscle spasm of back: Secondary | ICD-10-CM | POA: Diagnosis not present

## 2020-09-25 DIAGNOSIS — R69 Illness, unspecified: Secondary | ICD-10-CM | POA: Diagnosis not present

## 2020-09-27 ENCOUNTER — Other Ambulatory Visit: Payer: Self-pay | Admitting: Physical Medicine and Rehabilitation

## 2020-09-27 DIAGNOSIS — R69 Illness, unspecified: Secondary | ICD-10-CM | POA: Diagnosis not present

## 2020-09-27 DIAGNOSIS — R519 Headache, unspecified: Secondary | ICD-10-CM

## 2020-10-01 ENCOUNTER — Other Ambulatory Visit: Payer: Self-pay

## 2020-10-01 ENCOUNTER — Inpatient Hospital Stay: Payer: Medicare HMO | Attending: Hematology and Oncology

## 2020-10-01 ENCOUNTER — Inpatient Hospital Stay: Payer: Medicare HMO | Admitting: Hematology and Oncology

## 2020-10-01 VITALS — BP 112/57 | HR 73 | Temp 97.9°F | Resp 13 | Ht 66.0 in | Wt 188.4 lb

## 2020-10-01 DIAGNOSIS — Z79899 Other long term (current) drug therapy: Secondary | ICD-10-CM | POA: Insufficient documentation

## 2020-10-01 DIAGNOSIS — D508 Other iron deficiency anemias: Secondary | ICD-10-CM

## 2020-10-01 DIAGNOSIS — Z9884 Bariatric surgery status: Secondary | ICD-10-CM | POA: Insufficient documentation

## 2020-10-01 DIAGNOSIS — E611 Iron deficiency: Secondary | ICD-10-CM | POA: Insufficient documentation

## 2020-10-01 LAB — CMP (CANCER CENTER ONLY)
ALT: 24 U/L (ref 0–44)
AST: 50 U/L — ABNORMAL HIGH (ref 15–41)
Albumin: 4.3 g/dL (ref 3.5–5.0)
Alkaline Phosphatase: 73 U/L (ref 38–126)
Anion gap: 10 (ref 5–15)
BUN: 8 mg/dL (ref 6–20)
CO2: 27 mmol/L (ref 22–32)
Calcium: 9.1 mg/dL (ref 8.9–10.3)
Chloride: 104 mmol/L (ref 98–111)
Creatinine: 0.88 mg/dL (ref 0.44–1.00)
GFR, Estimated: >60 mL/min (ref >60)
Glucose, Bld: 77 mg/dL (ref 70–99)
Potassium: 3.7 mmol/L (ref 3.5–5.1)
Sodium: 141 mmol/L (ref 135–145)
Total Bilirubin: 0.5 mg/dL (ref 0.3–1.2)
Total Protein: 7.6 g/dL (ref 6.5–8.1)

## 2020-10-01 LAB — CBC WITH DIFFERENTIAL (CANCER CENTER ONLY)
Abs Immature Granulocytes: 0.02 10*3/uL (ref 0.00–0.07)
Basophils Absolute: 0.1 10*3/uL (ref 0.0–0.1)
Basophils Relative: 1 %
Eosinophils Absolute: 0.6 10*3/uL — ABNORMAL HIGH (ref 0.0–0.5)
Eosinophils Relative: 8 %
HCT: 43.8 % (ref 36.0–46.0)
Hemoglobin: 14.2 g/dL (ref 12.0–15.0)
Immature Granulocytes: 0 %
Lymphocytes Relative: 21 %
Lymphs Abs: 1.4 10*3/uL (ref 0.7–4.0)
MCH: 29.6 pg (ref 26.0–34.0)
MCHC: 32.4 g/dL (ref 30.0–36.0)
MCV: 91.3 fL (ref 80.0–100.0)
Monocytes Absolute: 0.8 10*3/uL (ref 0.1–1.0)
Monocytes Relative: 11 %
Neutro Abs: 4 10*3/uL (ref 1.7–7.7)
Neutrophils Relative %: 59 %
Platelet Count: 230 10*3/uL (ref 150–400)
RBC: 4.8 MIL/uL (ref 3.87–5.11)
RDW: 12.6 % (ref 11.5–15.5)
WBC Count: 6.9 10*3/uL (ref 4.0–10.5)
nRBC: 0 % (ref 0.0–0.2)

## 2020-10-01 LAB — IRON AND TIBC
Iron: 80 ug/dL (ref 41–142)
Saturation Ratios: 28 % (ref 21–57)
TIBC: 290 ug/dL (ref 236–444)
UIBC: 210 ug/dL (ref 120–384)

## 2020-10-01 LAB — TSH: TSH: 1.33 u[IU]/mL (ref 0.308–3.960)

## 2020-10-01 LAB — FERRITIN: Ferritin: 111 ng/mL (ref 11–307)
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
## 2020-10-02 ENCOUNTER — Encounter: Payer: Self-pay | Admitting: Hematology and Oncology

## 2020-10-03 ENCOUNTER — Telehealth: Payer: Self-pay | Admitting: Hematology and Oncology

## 2020-10-03 ENCOUNTER — Telehealth: Payer: Self-pay | Admitting: *Deleted

## 2020-10-03 NOTE — Telephone Encounter (Signed)
-----   Message from Orson Slick, MD sent at 10/02/2020  1:07 PM EDT ----- Please let [name] know that her iron stores remain strong and elevated. There is no need for additional IV iron at this time. Her fatigue may be related to another medical issue. We will see her back in 6 months, recommend continued f/u with her PCP.   ----- Message ----- From: Interface, Lab In [place] Sent: 10/01/2020   9:05 AM EDT To: Orson Slick, MD

## 2020-10-03 NOTE — Telephone Encounter (Signed)
Scheduled per los. Called and spoke with patient. Confirmed appts  

## 2020-10-03 NOTE — Telephone Encounter (Signed)
TCT patient regarding lab results. Spoke with patient. Advised that her iron stores are very good and that she does not need any IV iron at this time. Advised that her fatigue may be due to another cause and to f/u with her PCP.  She will return to this clinic in 6 months

## 2020-10-03 NOTE — Telephone Encounter (Signed)
Scheduled per los. Called and spoke with patient

## 2020-10-10 DIAGNOSIS — R69 Illness, unspecified: Secondary | ICD-10-CM | POA: Diagnosis not present

## 2020-10-11 ENCOUNTER — Encounter: Payer: Self-pay | Admitting: Family Medicine

## 2020-10-11 ENCOUNTER — Other Ambulatory Visit: Payer: Self-pay

## 2020-10-11 ENCOUNTER — Ambulatory Visit (INDEPENDENT_AMBULATORY_CARE_PROVIDER_SITE_OTHER): Payer: Medicare HMO | Admitting: Family Medicine

## 2020-10-11 VITALS — BP 129/84 | HR 96 | Ht 66.0 in | Wt 183.8 lb

## 2020-10-11 DIAGNOSIS — R7303 Prediabetes: Secondary | ICD-10-CM | POA: Diagnosis not present

## 2020-10-11 DIAGNOSIS — E611 Iron deficiency: Secondary | ICD-10-CM

## 2020-10-11 DIAGNOSIS — F3177 Bipolar disorder, in partial remission, most recent episode mixed: Secondary | ICD-10-CM

## 2020-10-11 DIAGNOSIS — I10 Essential (primary) hypertension: Secondary | ICD-10-CM

## 2020-10-11 DIAGNOSIS — K219 Gastro-esophageal reflux disease without esophagitis: Secondary | ICD-10-CM | POA: Diagnosis not present

## 2020-10-11 DIAGNOSIS — J4540 Moderate persistent asthma, uncomplicated: Secondary | ICD-10-CM | POA: Diagnosis not present

## 2020-10-11 DIAGNOSIS — E282 Polycystic ovarian syndrome: Secondary | ICD-10-CM

## 2020-10-11 DIAGNOSIS — G4733 Obstructive sleep apnea (adult) (pediatric): Secondary | ICD-10-CM

## 2020-10-11 DIAGNOSIS — Z7689 Persons encountering health services in other specified circumstances: Secondary | ICD-10-CM

## 2020-10-11 DIAGNOSIS — M25561 Pain in right knee: Secondary | ICD-10-CM

## 2020-10-11 DIAGNOSIS — E785 Hyperlipidemia, unspecified: Secondary | ICD-10-CM

## 2020-10-11 DIAGNOSIS — R1012 Left upper quadrant pain: Secondary | ICD-10-CM

## 2020-10-11 DIAGNOSIS — R69 Illness, unspecified: Secondary | ICD-10-CM | POA: Diagnosis not present

## 2020-10-11 MED ORDER — FLOVENT HFA 110 MCG/ACT IN AERO: 2.0000 | INHALATION_SPRAY | Freq: Two times a day (BID) | RESPIRATORY_TRACT | 12 refills | Status: DC
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
## 2020-10-17 ENCOUNTER — Ambulatory Visit
Admission: RE | Admit: 2020-10-17 | Discharge: 2020-10-17 | Disposition: A | Discharge status: Home / Self Care | Payer: Medicare HMO | Source: Ambulatory Visit | Attending: Physical Medicine and Rehabilitation | Admitting: Physical Medicine and Rehabilitation

## 2020-10-17 ENCOUNTER — Other Ambulatory Visit: Payer: Self-pay

## 2020-10-17 DIAGNOSIS — R519 Headache, unspecified: Secondary | ICD-10-CM

## 2020-10-17 DIAGNOSIS — J3489 Other specified disorders of nose and nasal sinuses: Secondary | ICD-10-CM | POA: Diagnosis not present

## 2020-10-22 ENCOUNTER — Encounter: Payer: Self-pay | Admitting: Family Medicine

## 2020-10-22 ENCOUNTER — Telehealth: Payer: Self-pay | Admitting: Family Medicine

## 2020-10-22 MED ORDER — AMOXICILLIN-POT CLAVULANATE 875-125 MG PO TABS: 1.0000 | ORAL_TABLET | Freq: Two times a day (BID) | ORAL | 0 refills | Status: DC

## 2020-10-22 NOTE — Telephone Encounter (Signed)
Pt called stating she's filling out some paperwork and needs Dr. Junius Roads DEA # . Pt would like a CB to further discuss.   [phone]

## 2020-10-23 DIAGNOSIS — R69 Illness, unspecified: Secondary | ICD-10-CM | POA: Diagnosis not present

## 2020-10-23 NOTE — Telephone Encounter (Signed)
I called and left voice mail to give me a call back to go over the paperwork needs.

## 2020-10-23 NOTE — Telephone Encounter (Signed)
I called the patient. The DEA # is not needed on the form for the [place]. The patient said she has refaxed the form, along with her 1099 (that is what was missing). Advised her that I would let her know when we get a response from [place] (patient assistance program).

## 2020-10-24 DIAGNOSIS — G894 Chronic pain syndrome: Secondary | ICD-10-CM | POA: Diagnosis not present

## 2020-10-24 DIAGNOSIS — M47816 Spondylosis without myelopathy or radiculopathy, lumbar region: Secondary | ICD-10-CM | POA: Diagnosis not present

## 2020-10-24 DIAGNOSIS — M62830 Muscle spasm of back: Secondary | ICD-10-CM | POA: Diagnosis not present

## 2020-10-24 DIAGNOSIS — Z79891 Long term (current) use of opiate analgesic: Secondary | ICD-10-CM | POA: Diagnosis not present

## 2020-10-24 DIAGNOSIS — M1711 Unilateral primary osteoarthritis, right knee: Secondary | ICD-10-CM | POA: Diagnosis not present

## 2020-11-07 DIAGNOSIS — G44219 Episodic tension-type headache, not intractable: Secondary | ICD-10-CM | POA: Insufficient documentation

## 2020-11-07 DIAGNOSIS — J343 Hypertrophy of nasal turbinates: Secondary | ICD-10-CM | POA: Insufficient documentation

## 2020-11-07 DIAGNOSIS — R0982 Postnasal drip: Secondary | ICD-10-CM | POA: Insufficient documentation

## 2020-11-07 DIAGNOSIS — J320 Chronic maxillary sinusitis: Secondary | ICD-10-CM | POA: Diagnosis not present

## 2020-11-08 DIAGNOSIS — R69 Illness, unspecified: Secondary | ICD-10-CM | POA: Diagnosis not present

## 2020-11-09 MED ORDER — FLUCONAZOLE 150 MG PO TABS: 150.0000 mg | ORAL_TABLET | ORAL | 0 refills | Status: DC

## 2020-11-13 DIAGNOSIS — R69 Illness, unspecified: Secondary | ICD-10-CM | POA: Diagnosis not present

## 2020-11-14 ENCOUNTER — Other Ambulatory Visit: Payer: Self-pay | Admitting: Family Medicine

## 2020-11-21 DIAGNOSIS — M62830 Muscle spasm of back: Secondary | ICD-10-CM | POA: Diagnosis not present

## 2020-11-21 DIAGNOSIS — M1711 Unilateral primary osteoarthritis, right knee: Secondary | ICD-10-CM | POA: Diagnosis not present

## 2020-11-21 DIAGNOSIS — M47816 Spondylosis without myelopathy or radiculopathy, lumbar region: Secondary | ICD-10-CM | POA: Diagnosis not present

## 2020-11-21 DIAGNOSIS — G894 Chronic pain syndrome: Secondary | ICD-10-CM | POA: Diagnosis not present

## 2020-11-23 ENCOUNTER — Other Ambulatory Visit: Payer: Self-pay | Admitting: Physical Medicine and Rehabilitation

## 2020-11-23 DIAGNOSIS — M5416 Radiculopathy, lumbar region: Secondary | ICD-10-CM

## 2020-11-25 DIAGNOSIS — J0101 Acute recurrent maxillary sinusitis: Secondary | ICD-10-CM | POA: Diagnosis not present

## 2020-11-25 DIAGNOSIS — R519 Headache, unspecified: Secondary | ICD-10-CM | POA: Diagnosis not present

## 2020-11-27 DIAGNOSIS — R69 Illness, unspecified: Secondary | ICD-10-CM | POA: Diagnosis not present

## 2020-11-30 DIAGNOSIS — Z23 Encounter for immunization: Secondary | ICD-10-CM | POA: Diagnosis not present

## 2020-11-30 DIAGNOSIS — E785 Hyperlipidemia, unspecified: Secondary | ICD-10-CM | POA: Diagnosis not present

## 2020-11-30 DIAGNOSIS — J45909 Unspecified asthma, uncomplicated: Secondary | ICD-10-CM | POA: Diagnosis not present

## 2020-11-30 DIAGNOSIS — J4540 Moderate persistent asthma, uncomplicated: Secondary | ICD-10-CM | POA: Diagnosis not present

## 2020-11-30 DIAGNOSIS — R0602 Shortness of breath: Secondary | ICD-10-CM | POA: Diagnosis not present

## 2020-11-30 DIAGNOSIS — M5416 Radiculopathy, lumbar region: Secondary | ICD-10-CM | POA: Diagnosis not present

## 2020-12-22 ENCOUNTER — Other Ambulatory Visit: Payer: Self-pay | Admitting: Internal Medicine

## 2020-12-31 ENCOUNTER — Ambulatory Visit: Payer: Medicare HMO | Admitting: Family Medicine

## 2021-01-08 ENCOUNTER — Telehealth: Payer: Self-pay | Admitting: Family Medicine

## 2021-01-08 NOTE — Telephone Encounter (Signed)
Called and left pt vm about right knee pains. Was unsure if pt needed to set appt with Dr. Junius Roads. Will try back at later date.

## 2021-01-11 ENCOUNTER — Other Ambulatory Visit: Payer: Self-pay

## 2021-01-11 ENCOUNTER — Ambulatory Visit
Admission: RE | Admit: 2021-01-11 | Discharge: 2021-01-11 | Disposition: A | Discharge status: Home / Self Care | Payer: Medicare HMO | Source: Ambulatory Visit | Attending: Physical Medicine and Rehabilitation | Admitting: Physical Medicine and Rehabilitation

## 2021-01-11 DIAGNOSIS — M5416 Radiculopathy, lumbar region: Secondary | ICD-10-CM

## 2021-01-11 MED ORDER — GADOBENATE DIMEGLUMINE 529 MG/ML IV SOLN
16.0000 mL | Freq: Once | INTRAVENOUS | Status: AC | PRN
  Administered 2021-01-11: 16 mL via INTRAVENOUS

## 2021-01-11 NOTE — Progress Notes (Deleted)
    Subjective:    CC: R knee pain  I, Renisha Cockrum, LAT, ATC, am serving as scribe for Dr. [name].  HPI: Pt is a 55 y/o female presenting w/ c/o R knee pain and patellar instability x .  She locates her pain to .  R knee swelling: R knee mechanical factors: Treatments tried: Aggravating factors:  Diagnostic imaging: R knee XR- 03/03/16 Pertinent review of Systems: ***  Relevant historical information: ***   Objective:   There were no vitals filed for this visit. General: Well Developed, well nourished, and in no acute distress.   MSK: ***  Lab and Radiology Results No results found for this or any previous visit (from the past 72 hour(s)). No results found.    Impression and Recommendations:    Assessment and Plan: 55 y.o. female with ***.  PDMP not reviewed this encounter. No orders of the defined types were placed in this encounter.  No orders of the defined types were placed in this encounter.   Discussed warning signs or symptoms. Please see discharge instructions. Patient expresses understanding.   ***

## 2021-01-14 ENCOUNTER — Ambulatory Visit: Payer: Medicare HMO | Admitting: Family Medicine

## 2021-01-16 ENCOUNTER — Other Ambulatory Visit: Payer: Self-pay

## 2021-01-16 ENCOUNTER — Ambulatory Visit: Payer: Self-pay

## 2021-01-16 ENCOUNTER — Ambulatory Visit (INDEPENDENT_AMBULATORY_CARE_PROVIDER_SITE_OTHER): Payer: Medicare HMO

## 2021-01-16 ENCOUNTER — Ambulatory Visit: Payer: Medicare HMO | Admitting: Family Medicine

## 2021-01-16 VITALS — BP 122/78 | HR 90 | Ht 66.0 in | Wt 180.8 lb

## 2021-01-16 DIAGNOSIS — M25561 Pain in right knee: Secondary | ICD-10-CM | POA: Diagnosis not present

## 2021-01-16 DIAGNOSIS — M1711 Unilateral primary osteoarthritis, right knee: Secondary | ICD-10-CM

## 2021-01-16 DIAGNOSIS — G8929 Other chronic pain: Secondary | ICD-10-CM

## 2021-01-16 NOTE — Progress Notes (Signed)
I, [name], LAT, ATC acting as a scribe for [name] Leader, MD.  Subjective:    CC: Right knee pain  HPI: Pt is a 55 y/o female c/o R knee pain x 1 month. Pt reports a hx of a tibial plateaux fx in R knee in 2006. 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 Pt locates pain to deep within knee joint, deep to patella, and along the posterior aspect. Pt c/o pain waking her up when trying to sleep.  R knee swelling: yes- slight Radiates: no Mechanical symptoms: yes- grinding Aggravates: sleeping, knee flex/ex, stepping down Treatments tried: heat, ice, knee brace  Dx imaging: 03/03/16 R knee XR  12/07/14 R knee XR  Pertinent review of Systems: No fevers or chills  Relevant historical information: Chronic back and neck pain.  History lateral release both knees.   Objective:    Vitals:   01/16/21 1603  BP: 122/78  Pulse: 90  SpO2: 97%   General: Well Developed, well nourished, and in no acute distress.   MSK: Right knee Quad atrophy with decreased VMO bulk.  No large effusion present. Normal motion with crepitation. Tender palpation anterior knee. Stable ligamentous exam. Positive medial McMurray's test. Intact strength over pain with resisted knee extension is present. Negative patellar apprehension test. Pulses and capillary refill are intact distally.  Lab and Radiology Results  Procedure: Real-time Ultrasound Guided Injection of right knee superior lateral patellar space Device: Philips Affiniti 50G Images permanently stored and available for review in PACS Verbal informed consent obtained.  Discussed risks and benefits of procedure. Warned about infection bleeding damage to structures skin hypopigmentation and fat atrophy among others. Patient expresses understanding and agreement Time-out conducted.   Noted no overlying erythema, induration, or other signs of local infection.   Skin prepped in  a sterile fashion.   Local anesthesia: Topical Ethyl chloride.   With sterile technique and under real time ultrasound guidance: 40 mg of Kenalog and 2 mL of Marcaine injected into knee joint. Fluid seen entering the joint capsule.   Completed without difficulty   Pain immediately resolved suggesting accurate placement of the medication.   Advised to call if fevers/chills, erythema, induration, drainage, or persistent bleeding.   Images permanently stored and available for review in the ultrasound unit.  Impression: Technically successful ultrasound guided injection.    X-ray images right knee obtained today personally and independently interpreted Significant patellofemoral degenerative changes.  No acute fractures visible. Await formal radiology review   Impression and Recommendations:    Assessment and Plan: 55 y.o. female with right knee pain primarily of the patellofemoral joint to be due to degenerative changes..  Additionally she does have very likely some dynamic and patellar lateral tracking issues primarily due to VMO weakness.  She has trouble affording physical therapy so I taught in clinic today VMO strengthening exercises that she can do without putting her knee excessively including straight leg raises and open chain knee extension exercises highlighting the VMO.  Additionally proceed with steroid injection in the knee while at the same time authorizing ironic acid injections and we can start with a steroid injection wears off.  Additionally recommend patellar stabilizing knee brace.  Recommend DonJoy Tru pull lite.  Recheck when it is time to proceed with the hyaluronic acid injections.  Additionally I congratulated her on her weight loss.  She is lost a considerable amount of weight with a lot of effort.  This is an excellent for  her knee pain.  PDMP not reviewed this encounter. Orders Placed This Encounter  Procedures   DG Knee AP/LAT W/Sunrise Right    Standing Status:    Future    Number of Occurrences:   1    Standing Expiration Date:   01/16/2022    Order Specific Question:   Reason for Exam (SYMPTOM  OR DIAGNOSIS REQUIRED)    Answer:   right knee pain    Order Specific Question:   Preferred imaging location?    Answer:   [name]    Order Specific Question:   Is patient pregnant?    Answer:   No   [place] LIMITED JOINT SPACE STRUCTURES LOW RIGHT(NO LINKED CHARGES)    Standing Status:   Future    Number of Occurrences:   1    Standing Expiration Date:   07/19/2021    Order Specific Question:   Reason for Exam (SYMPTOM  OR DIAGNOSIS REQUIRED)    Answer:   right knee pain    Order Specific Question:   Preferred imaging location?    Answer:   [facility]   No orders of the defined types were placed in this encounter.   Discussed warning signs or symptoms. Please see discharge instructions. Patient expresses understanding.   The above documentation has been reviewed and is accurate and complete [name], M.D.

## 2021-01-16 NOTE — Patient Instructions (Addendum)
Thank you for coming in today.   Call or go to the ER if you develop a large red swollen joint with extreme pain or oozing puss.    We will get set up for gel shots in the knee as well. These authorizations are typically valid for 6 months so I would recommend waiting till the cortisone shots starts wearing off.  Give me a little warning that you are coming in the gel shots.   DonJoy Tru-Pull Lite  Straight leg raise, toe out straight leg raise and ball squeeze knee extension

## 2021-01-21 NOTE — Progress Notes (Signed)
Right knee x-ray shows arthritis changes.

## 2021-02-04 ENCOUNTER — Other Ambulatory Visit: Payer: Self-pay | Admitting: Cardiology

## 2021-02-28 ENCOUNTER — Ambulatory Visit (INDEPENDENT_AMBULATORY_CARE_PROVIDER_SITE_OTHER): Payer: Medicare HMO | Admitting: Family Medicine

## 2021-02-28 ENCOUNTER — Ambulatory Visit: Payer: Self-pay

## 2021-02-28 ENCOUNTER — Other Ambulatory Visit: Payer: Self-pay

## 2021-02-28 DIAGNOSIS — M25561 Pain in right knee: Secondary | ICD-10-CM | POA: Diagnosis not present

## 2021-02-28 DIAGNOSIS — G8929 Other chronic pain: Secondary | ICD-10-CM | POA: Diagnosis not present

## 2021-02-28 DIAGNOSIS — M1711 Unilateral primary osteoarthritis, right knee: Secondary | ICD-10-CM | POA: Diagnosis not present

## 2021-02-28 NOTE — Patient Instructions (Signed)
Thank you for coming in today.   You received an Orthovisc gel injection today. Seek immediate medical attention if the joint becomes red, extremely painful, or is oozing fluid.   Please schedule the second injection for next week and the 3rd injection for the following week

## 2021-02-28 NOTE — Progress Notes (Signed)
[name] presents to clinic to Orthovisc injection right knee 1/3  Procedure: Real-time Ultrasound Guided Injection of right knee superior lateral patellar space Device: Philips Affiniti 50G Images permanently stored and available for review in PACS Verbal informed consent obtained.  Discussed risks and benefits of procedure. Warned about infection bleeding damage to structures skin hypopigmentation and fat atrophy among others. Patient expresses understanding and agreement Time-out conducted.   Noted no overlying erythema, induration, or other signs of local infection.   Skin prepped in a sterile fashion.   Local anesthesia: Topical Ethyl chloride.   With sterile technique and under real time ultrasound guidance: Orthovisc 30 mg injected into knee joint. Fluid seen entering the joint capsule.   Completed without difficulty    Advised to call if fevers/chills, erythema, induration, drainage, or persistent bleeding.   Images permanently stored and available for review in the ultrasound unit.  Impression: Technically successful ultrasound guided injection.  Lot [id number]  Return 1 week for Orthovisc injection right knee 2/3

## 2021-03-06 ENCOUNTER — Ambulatory Visit (INDEPENDENT_AMBULATORY_CARE_PROVIDER_SITE_OTHER): Payer: Medicare HMO | Admitting: Family Medicine

## 2021-03-06 ENCOUNTER — Other Ambulatory Visit: Payer: Self-pay

## 2021-03-06 ENCOUNTER — Ambulatory Visit: Payer: Self-pay

## 2021-03-06 DIAGNOSIS — G8929 Other chronic pain: Secondary | ICD-10-CM | POA: Diagnosis not present

## 2021-03-06 DIAGNOSIS — M1711 Unilateral primary osteoarthritis, right knee: Secondary | ICD-10-CM | POA: Diagnosis not present

## 2021-03-06 DIAGNOSIS — M25561 Pain in right knee: Secondary | ICD-10-CM | POA: Diagnosis not present

## 2021-03-06 NOTE — Patient Instructions (Signed)
Good to see you today.  You had a R knee Orthovisc injection (2/3).  Call or go to the ER if you develop a large red swollen joint with extreme pain or oozing puss.   Please make sure you're scheduled for your 3rd Orthovisc injection sometime next week.

## 2021-03-06 NOTE — Progress Notes (Signed)
[name] presents to clinic today for Orthovisc injection right knee 2/3  Procedure: Real-time Ultrasound Guided Injection of right knee superior lateral patellar space Device: Philips Affiniti 50G Images permanently stored and available for review in PACS Verbal informed consent obtained.  Discussed risks and benefits of procedure. Warned about infection bleeding damage to structures skin hypopigmentation and fat atrophy among others. Patient expresses understanding and agreement Time-out conducted.   Noted no overlying erythema, induration, or other signs of local infection.   Skin prepped in a sterile fashion.   Local anesthesia: Topical Ethyl chloride.   With sterile technique and under real time ultrasound guidance: Orthovisc 30 mg injected into right knee. Fluid seen entering the joint capsule.   Completed without difficulty   Pain immediately resolved suggesting accurate placement of the medication.   Advised to call if fevers/chills, erythema, induration, drainage, or persistent bleeding.   Images permanently stored and available for review in the ultrasound unit.  Impression: Technically successful ultrasound guided injection.   Lot number: [id number]  Return in 1 week for Orthovisc injection right knee 3/3

## 2021-03-13 ENCOUNTER — Ambulatory Visit: Payer: Self-pay

## 2021-03-13 ENCOUNTER — Other Ambulatory Visit: Payer: Self-pay

## 2021-03-13 ENCOUNTER — Ambulatory Visit (INDEPENDENT_AMBULATORY_CARE_PROVIDER_SITE_OTHER): Payer: Medicare HMO | Admitting: Family Medicine

## 2021-03-13 DIAGNOSIS — M25561 Pain in right knee: Secondary | ICD-10-CM

## 2021-03-13 DIAGNOSIS — M1711 Unilateral primary osteoarthritis, right knee: Secondary | ICD-10-CM

## 2021-03-13 DIAGNOSIS — G8929 Other chronic pain: Secondary | ICD-10-CM | POA: Diagnosis not present

## 2021-03-13 NOTE — Progress Notes (Signed)
[name] presents to clinic today for Orthovisc injection right knee 3/3  Procedure: Real-time Ultrasound Guided Injection of right knee superior lateral patellar space Device: Philips Affiniti 50G Images permanently stored and available for review in PACS Verbal informed consent obtained.  Discussed risks and benefits of procedure. Warned about infection bleeding damage to structures skin hypopigmentation and fat atrophy among others. Patient expresses understanding and agreement Time-out conducted.   Noted no overlying erythema, induration, or other signs of local infection.   Skin prepped in a sterile fashion.   Local anesthesia: Topical Ethyl chloride.   With sterile technique and under real time ultrasound guidance: Orthovisc 30 mg injected into knee joint. Fluid seen entering the joint capsule.   Completed without difficulty   Advised to call if fevers/chills, erythema, induration, drainage, or persistent bleeding.   Images permanently stored and available for review in the ultrasound unit.  Impression: Technically successful ultrasound guided injection.  Lot number: [id number]  Return as needed

## 2021-03-13 NOTE — Patient Instructions (Signed)
Thank you for coming in today.   You received a gel injection today in your knee. Seek immediate medical attention if the joint becomes red, extremely painful, or is oozing fluid.   Recheck back as needed

## 2021-03-28 DIAGNOSIS — J069 Acute upper respiratory infection, unspecified: Secondary | ICD-10-CM | POA: Insufficient documentation

## 2021-04-04 ENCOUNTER — Other Ambulatory Visit: Payer: Self-pay

## 2021-04-04 ENCOUNTER — Other Ambulatory Visit: Payer: Self-pay | Admitting: Hematology and Oncology

## 2021-04-04 ENCOUNTER — Inpatient Hospital Stay: Payer: Medicare HMO | Attending: Hematology and Oncology | Admitting: Hematology and Oncology

## 2021-04-04 ENCOUNTER — Inpatient Hospital Stay: Payer: Medicare HMO

## 2021-04-04 VITALS — BP 138/85 | HR 90 | Temp 97.1°F | Resp 18 | Wt 172.3 lb

## 2021-04-04 DIAGNOSIS — Z79899 Other long term (current) drug therapy: Secondary | ICD-10-CM | POA: Diagnosis not present

## 2021-04-04 DIAGNOSIS — E611 Iron deficiency: Secondary | ICD-10-CM | POA: Diagnosis present

## 2021-04-04 DIAGNOSIS — D508 Other iron deficiency anemias: Secondary | ICD-10-CM

## 2021-04-04 DIAGNOSIS — K909 Intestinal malabsorption, unspecified: Secondary | ICD-10-CM | POA: Diagnosis not present

## 2021-04-04 DIAGNOSIS — Z9884 Bariatric surgery status: Secondary | ICD-10-CM | POA: Insufficient documentation

## 2021-04-04 LAB — CBC WITH DIFFERENTIAL (CANCER CENTER ONLY)
Abs Immature Granulocytes: 0.03 10*3/uL (ref 0.00–0.07)
Basophils Absolute: 0.1 10*3/uL (ref 0.0–0.1)
Basophils Relative: 1 %
Eosinophils Absolute: 0.4 10*3/uL (ref 0.0–0.5)
Eosinophils Relative: 6 %
HCT: 41.1 % (ref 36.0–46.0)
Hemoglobin: 13.8 g/dL (ref 12.0–15.0)
Immature Granulocytes: 1 %
Lymphocytes Relative: 24 %
Lymphs Abs: 1.5 10*3/uL (ref 0.7–4.0)
MCH: 30.1 pg (ref 26.0–34.0)
MCHC: 33.6 g/dL (ref 30.0–36.0)
MCV: 89.7 fL (ref 80.0–100.0)
Monocytes Absolute: 0.5 10*3/uL (ref 0.1–1.0)
Monocytes Relative: 8 %
Neutro Abs: 4 10*3/uL (ref 1.7–7.7)
Neutrophils Relative %: 60 %
Platelet Count: 226 10*3/uL (ref 150–400)
RBC: 4.58 MIL/uL (ref 3.87–5.11)
RDW: 13.4 % (ref 11.5–15.5)
WBC Count: 6.5 10*3/uL (ref 4.0–10.5)
nRBC: 0 % (ref 0.0–0.2)

## 2021-04-04 LAB — CMP (CANCER CENTER ONLY)
ALT: 11 U/L (ref 0–44)
AST: 15 U/L (ref 15–41)
Albumin: 4.1 g/dL (ref 3.5–5.0)
Alkaline Phosphatase: 66 U/L (ref 38–126)
Anion gap: 9 (ref 5–15)
BUN: 7 mg/dL (ref 6–20)
CO2: 27 mmol/L (ref 22–32)
Calcium: 9.4 mg/dL (ref 8.9–10.3)
Chloride: 105 mmol/L (ref 98–111)
Creatinine: 0.83 mg/dL (ref 0.44–1.00)
GFR, Estimated: >60 mL/min (ref >60)
Glucose, Bld: 94 mg/dL (ref 70–99)
Potassium: 3.9 mmol/L (ref 3.5–5.1)
Sodium: 141 mmol/L (ref 135–145)
Total Bilirubin: 0.6 mg/dL (ref 0.3–1.2)
Total Protein: 6.9 g/dL (ref 6.5–8.1)

## 2021-04-04 LAB — RETIC PANEL
Immature Retic Fract: 4.2 % (ref 2.3–15.9)
RBC.: 4.64 MIL/uL (ref 3.87–5.11)
Retic Count, Absolute: 51.5 10*3/uL (ref 19.0–186.0)
Retic Ct Pct: 1.1 % (ref 0.4–3.1)
Reticulocyte Hemoglobin: 35.3 pg (ref >27.9)

## 2021-04-04 LAB — IRON AND TIBC
Iron: 77 ug/dL (ref 41–142)
Saturation Ratios: 28 % (ref 21–57)
TIBC: 279 ug/dL (ref 236–444)
UIBC: 202 ug/dL (ref 120–384)

## 2021-04-04 LAB — FERRITIN: Ferritin: 75 ng/mL (ref 11–307)

## 2021-04-04 LAB — VITAMIN B12: Vitamin B-12: 546 pg/mL (ref 180–914)

## 2021-04-05 ENCOUNTER — Encounter: Payer: Self-pay | Admitting: Hematology and Oncology

## 2021-04-05 NOTE — Progress Notes (Signed)
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  1) 01/01/2020: WBC 8.0, Hgb 12.8, MCV 86.9, Plt 284 2) 01/12/2020: establish care with Dr. Lorenso Courier  3) 7/16-7/23/2021:  2 doses of IV feraheme 4) 10/01/2020: WBC 6.9, Hgb 14.2, MCV 91.3, Plt 230 5) 04/04/2021: WBC 6.5, Hgb 13.8, MCV 89.7, Plt 226, Ferritin 75   Interval History:  [name] 55 y.o. 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 The patient's last visit was on 10/01/2020. In the interim since the last visit she has had no major changes in her health.    On exam today [name] notes she feels like she is getting a little bit worse compared to her last visit.  She notes worsening fatigue and easy bruising.  She also reports that she easily loses balance and falls.  She notes that she has headaches which she is currently taking Topamax.  She has a neuropathy of her hands she reports that her legs "stay bruised".  She has that she has been evaluated by ENT for this without much relief.  She currently denies having any issues with fevers, chills, sweats, nausea, chest pain, or shortness of breath.  A full 10 point ROS is listed below.  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   Obesity 07/14/2012   OSA (obstructive sleep apnea)    mild   Pneumonia    Polycystic disease, ovaries    Postoperative anemia    2018 - while dieting  hx of   Reflux    Sinus tachycardia 07/14/2012   Pt says HR > 100 is consistent   Sleep apnea    has had two tests, different results- no CPAP used    SURGICAL HISTORY: Past Surgical History:  Procedure Laterality Date   COLONOSCOPY     FOOT SURGERY     Right plantar facsiitis scrape   KNEE SURGERY Bilateral 2007   LAPAROSCOPIC GASTRIC SLEEVE RESECTION N/A 03/30/2017   Procedure: LAPAROSCOPIC GASTRIC SLEEVE RESECTION, UPPER ENDO;  Surgeon: Greer Pickerel, MD;  Location: [facility];  Service: General;  Laterality: N/A;   NASAL SINUS SURGERY  2004   OVARIAN CYST REMOVAL  1998   A cyst on the fallopian tube removed   SPINAL FUSION     TONSILLECTOMY  1996    SOCIAL HISTORY: Social History   Socioeconomic History  Marital status: Married    Spouse name: Not on file   Number of children: 0   Years of education: Not on file   Highest education level: Not on file  Occupational History   Occupation: disabled    Employer: UNEMPLOYED  Tobacco Use   Smoking status: Never   Smokeless tobacco: Never  Vaping Use   Vaping Use: Never used  Substance and Sexual Activity   Alcohol use: No   Drug use: No   Sexual activity: Yes    Birth control/protection: None  Other Topics Concern   Not on file  Social History Narrative   Not on file   Social Determinants of Health   Financial Resource Strain: Not on file  Food Insecurity: Not on file  Transportation Needs: Not on file  Physical Activity: Not on file   Stress: Not on file  Social Connections: Not on file  Intimate Partner Violence: Not on file    FAMILY HISTORY: Family History  Problem Relation Age of Onset   Cancer Father        Leukemia   Diabetes Father    Heart disease Father        first PCI in his 43s   Diabetes Mother    Cancer Maternal Grandmother        Stomach   Colon cancer Maternal Grandmother    Cancer Paternal Grandmother    Cancer Paternal Aunt    Diabetes Paternal Aunt    Cancer Paternal Uncle    Cancer Paternal Aunt    Rectal cancer Neg Hx    Stomach cancer Neg Hx    Esophageal cancer Neg Hx     ALLERGIES:  is allergic to carbamazepine, gabapentin, pineapple, shellfish-derived products, erythromycin, latex, pregabalin, duloxetine, other, and pollen extract.  MEDICATIONS:  Current Outpatient Medications  Medication Sig Dispense Refill   EPINEPHrine 0.3 mg/0.3 mL IJ SOAJ injection Inject into the muscle.     rizatriptan (MAXALT) 10 MG tablet Take 1 tablet by mouth as needed.     triamcinolone cream (KENALOG) 0.1 % APPLY 1 APPLICATION ON THE SKIN AS DIRECTED APPLY 1-2X PER DAY TO ELBOWS     albuterol (VENTOLIN HFA) 108 (90 Base) MCG/ACT inhaler Inhale 2 puffs into the lungs every 4 (four) hours as needed for wheezing or shortness of breath.     ALPRAZolam (XANAX XR) 1 MG 24 hr tablet Take 1 mg by mouth 2 (two) times daily.     ALPRAZolam (XANAX) 1 MG tablet Take 1 mg by mouth every morning.     atomoxetine (STRATTERA) 40 MG capsule Take by mouth every morning.     Biotin 2500 MCG CAPS Take by mouth daily in the afternoon.     dexlansoprazole (DEXILANT) 60 MG capsule Take 60 mg by mouth daily.     escitalopram (LEXAPRO) 20 MG tablet Take 1 tablet (20 mg total) by mouth daily with breakfast. 30 tablet 3   estradiol (ESTRACE) 1 MG tablet      fentaNYL (DURAGESIC - DOSED MCG/HR) 50 MCG/HR Place 50 mcg onto the skin every 3 (three) days.      FIBER PO Take 5 mg by mouth daily.     fluconazole (DIFLUCAN) 150  MG tablet TAKE 1 TABLET (150 MG TOTAL) BY MOUTH EVERY 3 (THREE) DAYS 5 tablet 0   fluticasone (FLONASE) 50 MCG/ACT nasal spray INSTILL 1 SPRAY IN EACH NOSTRIL ONCE DAILY AS NEEDED FOR ALLERGIES (Patient taking differently: Place 1 spray into both nostrils daily  as needed for allergies.) 16 mL 12   fluticasone (FLOVENT HFA) 110 MCG/ACT inhaler Inhale 2 puffs into the lungs in the morning and at bedtime. 1 each 12   HYDROmorphone (DILAUDID) 2 MG tablet Take 2 mg by mouth every 6 (six) hours.     lidocaine (XYLOCAINE) 2 % jelly Apply 1 application topically as needed. 30 mL 2   linaCLOtide (LINZESS PO)      medroxyPROGESTERone (PROVERA) 2.5 MG tablet Take 1 tablet (2.5 mg total) by mouth daily. 90 tablet 5   Melatonin 10 MG CAPS Take by mouth at bedtime.     montelukast (SINGULAIR) 10 MG tablet Take 10 mg by mouth at bedtime.     MOVANTIK 25 MG TABS tablet Take 25 mg by mouth at bedtime.      Multiple Vitamins-Minerals (MULTIVITAMIN GUMMIES WOMENS PO) Take by mouth daily in the afternoon.     naloxone (NARCAN) nasal spray 4 mg/0.1 mL SMARTSIG:Both Nares     pantoprazole (PROTONIX) 40 MG tablet Take 1 tablet (40 mg total) by mouth daily. Office visit for further refills 90 tablet 0   QUEtiapine (SEROQUEL) 100 MG tablet Take by mouth.     Sennosides (SENOKOT PO) Take 2 tablets by mouth at bedtime. 2 tablets at night      temazepam (RESTORIL) 30 MG capsule Take 30 mg by mouth at bedtime as needed.     tiZANidine (ZANAFLEX) 2 MG tablet Take 2 mg by mouth 3 (three) times daily as needed.     traZODone (DESYREL) 100 MG tablet Take 300 mg by mouth at bedtime.     valACYclovir (VALTREX) 1000 MG tablet TAKE 1 TABLET BY MOUTH EVERY DAY (Patient taking differently: Take 1,000 mg by mouth daily.) 90 tablet 4   No current facility-administered medications for this visit.    REVIEW OF SYSTEMS:   Constitutional: ( - ) fevers, ( - )  chills , ( - ) night sweats Eyes: ( - ) blurriness of vision, ( - ) double  vision, ( - ) watery eyes Ears, nose, mouth, throat, and face: ( - ) mucositis, ( - ) sore throat Respiratory: ( - ) cough, ( - ) dyspnea, ( - ) wheezes Cardiovascular: ( - ) palpitation, ( - ) chest discomfort, ( - ) lower extremity swelling Gastrointestinal:  ( - ) nausea, ( - ) heartburn, ( - ) change in bowel habits Skin: ( - ) abnormal skin rashes Lymphatics: ( - ) new lymphadenopathy, ( - ) easy bruising Neurological: ( - ) numbness, ( - ) tingling, ( - ) new weaknesses Behavioral/Psych: ( - ) mood change, ( - ) new changes  All other systems were reviewed with the patient and are negative.  PHYSICAL EXAMINATION: ECOG PERFORMANCE STATUS: 1 - Symptomatic but completely ambulatory  Vitals:   04/04/21 1050  BP: 138/85  Pulse: 90  Resp: 18  Temp: (!) 97.1 F (36.2 C)  SpO2: 96%   Filed Weights   04/04/21 1050  Weight: 172 lb 5 oz (78.2 kg)    GENERAL: well appearing middle aged Caucasian female, alert, no distress and comfortable SKIN: skin color, texture, turgor are normal, no rashes or significant lesions EYES: conjunctiva are pink and non-injected, sclera clear LUNGS: clear to auscultation and percussion with normal breathing effort HEART: regular rate & rhythm and no murmurs and no lower extremity edema Musculoskeletal: no cyanosis of digits and no clubbing  PSYCH: alert & oriented x 3, fluent speech NEURO: no focal  motor/sensory deficits  LABORATORY DATA:  I have reviewed the data as listed CBC Latest Ref Rng & Units 04/04/2021 10/01/2020 03/08/2020  WBC 4.0 - 10.5 K/uL 6.5 6.9 8.2  Hemoglobin 12.0 - 15.0 g/dL 13.8 14.2 13.8  Hematocrit 36.0 - 46.0 % 41.1 43.8 43.0  Platelets 150 - 400 K/uL 226 230 253    CMP Latest Ref Rng & Units 04/04/2021 10/01/2020 04/11/2020  Glucose 70 - 99 mg/dL 94 77 86  BUN 6 - 20 mg/dL 7 8 10   Creatinine 0.44 - 1.00 mg/dL 0.83 0.88 0.87  Sodium 135 - 145 mmol/L 141 141 140  Potassium 3.5 - 5.1 mmol/L 3.9 3.7 4.8  Chloride 98 - 111 mmol/L  105 104 98  CO2 22 - 32 mmol/L 27 27 29   Calcium 8.9 - 10.3 mg/dL 9.4 9.1 10.2  Total Protein 6.5 - 8.1 g/dL 6.9 7.6 -  Total Bilirubin 0.3 - 1.2 mg/dL 0.6 0.5 -  Alkaline Phos 38 - 126 U/L 66 73 -  AST 15 - 41 U/L 15 50(H) -  ALT 0 - 44 U/L 11 24 -    RADIOGRAPHIC STUDIES: [place] LIMITED JOINT SPACE STRUCTURES LOW RIGHT(NO LINKED CHARGES)  Result Date: 03/26/2021 [name] presents to clinic today for Orthovisc injection right knee 3/3   Procedure: Real-time Ultrasound Guided Injection of right knee superior lateral patellar space Device: Philips Affiniti 50G Images permanently stored and available for review in PACS Verbal informed consent obtained.  Discussed risks and benefits of procedure. Warned about infection bleeding damage to structures skin hypopigmentation and fat atrophy among others. Patient expresses understanding and agreement Time-out conducted.   Noted no overlying erythema, induration, or other signs of local infection.   Skin prepped in a sterile fashion.   Local anesthesia: Topical Ethyl chloride.   With sterile technique and under real time ultrasound guidance: Orthovisc 30 mg injected into knee joint. Fluid seen entering the joint capsule.   Completed without difficulty   Advised to call if fevers/chills, erythema, induration, drainage, or persistent bleeding.   Images permanently stored and available for review in the ultrasound unit. Impression: Technically successful ultrasound guided injection.   Lot number: [id number]   Return as needed   ASSESSMENT & PLAN NATALIYAH PACKHAM 55 y.o. 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    In the interim [name] has unfortunately had no improvement in her symptoms.  She had a near two-point increase in her hemoglobin with the iron therapy, but unfortunately she has not felt any benefit as result of this.  We did  discuss prior to administration of IV iron therapy that treating iron deficiency without anemia is controversial and not a guarantee to improve symptoms.  At this time I would recommend that she continue to follow-up with her other physicians in order to help work-up her fatigue and other symptoms.  We will plan to see the patient back in approximately 6 months time to assure that her hemoglobin and iron levels remain in the normal range.  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  --Ferritin 75, Hgb 13.8 today.  --normally IV iron therapy is reserved for patients with anemia, but in this patient's case she has iron deficiency due to poor absorption and dietary modifications towards foods with decreased iron. I think she is a good candidate for IV iron therapy to replete her stores --  today will repeat CBC, CMP, iron panel, and ferritin.  -- vitamin b12 and folate levels are WNL --completed IV feraheme 510mg  q 7days x 2 doses 7/16-7/23/2021 --RTC in 6 months unless more iron repletion therapy is needed.   #Dizziness #Neuropathy -- [name] reports that she has been having dizziness and occasional falls. --Also endorses having numbness and tingling of her hands and feet --Iron deficiency anemia has corrected and no evidence of vitamin B12 deficiency --We will make referral to neurology.  No orders of the defined types were placed in this encounter.  All questions were answered. The patient knows to call the clinic with any problems, questions or concerns.  A total of more than 30 minutes were spent on this encounter and over half of that time was spent on counseling and coordination of care as outlined above.   [name] Peoples, MD Department of Hematology/Oncology [place] [facility] Phone: [phone] Pager: [phone] Email: [email or web address]  04/05/2021 1:14 PM

## 2021-04-06 ENCOUNTER — Other Ambulatory Visit: Payer: Self-pay | Admitting: Medical

## 2021-04-06 ENCOUNTER — Other Ambulatory Visit: Payer: Self-pay | Admitting: Internal Medicine

## 2021-04-28 ENCOUNTER — Other Ambulatory Visit: Payer: Self-pay | Admitting: Medical

## 2021-04-28 ENCOUNTER — Other Ambulatory Visit: Payer: Self-pay | Admitting: Internal Medicine

## 2021-07-09 LAB — HM PAP SMEAR: HPV, high-risk: NEGATIVE

## 2021-09-26 ENCOUNTER — Encounter: Payer: Self-pay | Admitting: Hematology and Oncology

## 2021-10-01 ENCOUNTER — Telehealth: Payer: Self-pay | Admitting: Hematology and Oncology

## 2021-10-01 NOTE — Telephone Encounter (Signed)
Rescheduled appointment per patient. Left message with new appointment times.  ?

## 2021-10-03 ENCOUNTER — Ambulatory Visit: Payer: Medicare HMO | Admitting: Hematology and Oncology

## 2021-10-03 ENCOUNTER — Other Ambulatory Visit: Payer: Medicare HMO

## 2021-10-16 ENCOUNTER — Other Ambulatory Visit: Payer: Self-pay

## 2021-10-16 DIAGNOSIS — D508 Other iron deficiency anemias: Secondary | ICD-10-CM

## 2021-10-17 ENCOUNTER — Other Ambulatory Visit: Payer: Self-pay

## 2021-10-17 ENCOUNTER — Inpatient Hospital Stay: Payer: Medicare (Managed Care) | Attending: Hematology and Oncology

## 2021-10-17 ENCOUNTER — Inpatient Hospital Stay (HOSPITAL_BASED_OUTPATIENT_CLINIC_OR_DEPARTMENT_OTHER): Payer: Medicare (Managed Care) | Admitting: Hematology and Oncology

## 2021-10-17 VITALS — BP 122/73 | HR 70 | Temp 97.8°F | Resp 17 | Ht 66.0 in | Wt 192.6 lb

## 2021-10-17 DIAGNOSIS — R42 Dizziness and giddiness: Secondary | ICD-10-CM | POA: Diagnosis not present

## 2021-10-17 DIAGNOSIS — E119 Type 2 diabetes mellitus without complications: Secondary | ICD-10-CM | POA: Insufficient documentation

## 2021-10-17 DIAGNOSIS — I10 Essential (primary) hypertension: Secondary | ICD-10-CM | POA: Insufficient documentation

## 2021-10-17 DIAGNOSIS — E282 Polycystic ovarian syndrome: Secondary | ICD-10-CM | POA: Diagnosis not present

## 2021-10-17 DIAGNOSIS — D508 Other iron deficiency anemias: Secondary | ICD-10-CM

## 2021-10-17 DIAGNOSIS — Z9884 Bariatric surgery status: Secondary | ICD-10-CM | POA: Insufficient documentation

## 2021-10-17 DIAGNOSIS — E611 Iron deficiency: Secondary | ICD-10-CM | POA: Insufficient documentation

## 2021-10-17 DIAGNOSIS — Z79899 Other long term (current) drug therapy: Secondary | ICD-10-CM | POA: Diagnosis not present

## 2021-10-17 LAB — CBC WITH DIFFERENTIAL (CANCER CENTER ONLY)
Abs Immature Granulocytes: 0.02 10*3/uL (ref 0.00–0.07)
Basophils Absolute: 0.1 10*3/uL (ref 0.0–0.1)
Basophils Relative: 1 %
Eosinophils Absolute: 0.5 10*3/uL (ref 0.0–0.5)
Eosinophils Relative: 5 %
HCT: 42.4 % (ref 36.0–46.0)
Hemoglobin: 14 g/dL (ref 12.0–15.0)
Immature Granulocytes: 0 %
Lymphocytes Relative: 11 %
Lymphs Abs: 1.1 10*3/uL (ref 0.7–4.0)
MCH: 29.6 pg (ref 26.0–34.0)
MCHC: 33 g/dL (ref 30.0–36.0)
MCV: 89.6 fL (ref 80.0–100.0)
Monocytes Absolute: 0.7 10*3/uL (ref 0.1–1.0)
Monocytes Relative: 7 %
Neutro Abs: 7.5 10*3/uL (ref 1.7–7.7)
Neutrophils Relative %: 76 %
Platelet Count: 212 10*3/uL (ref 150–400)
RBC: 4.73 MIL/uL (ref 3.87–5.11)
RDW: 12.9 % (ref 11.5–15.5)
WBC Count: 9.8 10*3/uL (ref 4.0–10.5)
nRBC: 0 % (ref 0.0–0.2)

## 2021-10-17 LAB — CMP (CANCER CENTER ONLY)
ALT: 7 U/L (ref 0–44)
AST: 12 U/L — ABNORMAL LOW (ref 15–41)
Albumin: 4.2 g/dL (ref 3.5–5.0)
Alkaline Phosphatase: 66 U/L (ref 38–126)
Anion gap: 4 — ABNORMAL LOW (ref 5–15)
BUN: 7 mg/dL (ref 6–20)
CO2: 33 mmol/L — ABNORMAL HIGH (ref 22–32)
Calcium: 9.1 mg/dL (ref 8.9–10.3)
Chloride: 104 mmol/L (ref 98–111)
Creatinine: 0.86 mg/dL (ref 0.44–1.00)
GFR, Estimated: >60 mL/min (ref >60)
Glucose, Bld: 85 mg/dL (ref 70–99)
Potassium: 3.7 mmol/L (ref 3.5–5.1)
Sodium: 141 mmol/L (ref 135–145)
Total Bilirubin: 0.8 mg/dL (ref 0.3–1.2)
Total Protein: 7 g/dL (ref 6.5–8.1)

## 2021-10-17 LAB — IRON AND IRON BINDING CAPACITY (CC-WL,HP ONLY)
Iron: 84 ug/dL (ref 28–170)
Saturation Ratios: 24 % (ref 10.4–31.8)
TIBC: 354 ug/dL (ref 250–450)
UIBC: 270 ug/dL (ref 148–442)

## 2021-10-17 LAB — FERRITIN: Ferritin: 41 ng/mL (ref 11–307)

## 2021-10-17 LAB — RETIC PANEL
Immature Retic Fract: 2.8 % (ref 2.3–15.9)
RBC.: 4.7 MIL/uL (ref 3.87–5.11)
Retic Count, Absolute: 26.8 10*3/uL (ref 19.0–186.0)
Retic Ct Pct: 0.6 % (ref 0.4–3.1)
Reticulocyte Hemoglobin: 33.8 pg (ref >27.9)

## 2021-10-17 LAB — VITAMIN B12: Vitamin B-12: 363 pg/mL (ref 180–914)

## 2021-10-17 NOTE — Progress Notes (Signed)
?[facility] ?[phone]   [phone] ? ?PROGRESS NOTE ? ?Patient Care Team: ?Kristopher Glee., MD as PCP - General (Internal Medicine) ?Martinique, Peter M, MD as PCP - Cardiology (Cardiology) ?Margaretha Sheffield, MD as Referring Physician (Physical Medicine and Rehabilitation) ?Norma Fredrickson, MD as Consulting Physician (Psychiatry) ? 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  ?1) 01/01/2020: WBC 8.0, Hgb 12.8, MCV 86.9, Plt 284 ?2) 01/12/2020: establish care with Dr. Lorenso Courier  ?3) 7/16-7/23/2021:  2 doses of IV feraheme ?4) 10/01/2020: WBC 6.9, Hgb 14.2, MCV 91.3, Plt 230 ?5) 04/04/2021: WBC 6.5, Hgb 13.8, MCV 89.7, Plt 226, Ferritin 75 ?6) 10/17/2021: WBC 9.8, Hgb 14.0, MCV 89.6, Plt 212, Ferritin 41 ?  ?Interval History:  ?[name] 56 y.o. 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 The patient's last visit was on 04/04/2021. In the interim since the last visit she has had no major changes in her health.  ? ? On exam today [name] notes she continues to have terrible fatigue.  She notes she has low levels of motivation and her energy is currently a 3 out of 10.  She notes it has been gradually worsening since her last visit.  There has been no other cause for her fatigue found.  She notes that she does have occasional episodes of lightheadedness and dizziness.  Her appetite has not been good as her taste for food has decreased.  Her weight has increased from 172 pounds up to 192 pounds today.  She is unsure what might be causing this.  She is currently being evaluated by her psychiatrist and PCP with no etiology found.  She had thyroid studies ordered on 09/12/2021 which showed no clear cause.  She has that she is not having feelings of depression or feeling down.  She currently  denies having any issues with fevers, chills, sweats, nausea, chest pain, or shortness of breath.  A full 10 point ROS is listed below. ? ?MEDICAL HISTORY:  ?Past Medical History:  ?Diagnosis Date  ? Allergy   ? Anxiety   ? Arthritis   ? Asthma   ? History of Asthma  ? Bipolar disorder ([place])   ? Chest pain, atypical 11/30/2012  ? Clotting disorder ([place])   ? Depression   ? Diabetes mellitus without complication ([place])   ? type 2, pre-diabetic before she lost weight  ? GERD (gastroesophageal reflux disease)   ? H/O degenerative disc disease   ? L4-L5, L5-S1  ? Heart murmur   ? Hyperglycemia   ? Postoperative hyperglycemia  ? Hypertension   ? hx of  ? Neuromuscular disorder ([place])   ? 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 Obesity 07/14/2012  ? OSA (obstructive sleep apnea)   ? mild  ? Pneumonia   ? Polycystic disease, ovaries   ? Postoperative anemia   ? 2018 - while dieting  hx of  ? Reflux   ? Sinus tachycardia 07/14/2012  ? Pt says HR > 100 is consistent  ? Sleep apnea   ? has had two tests, different results- no CPAP used  ? ? ?SURGICAL HISTORY: ?Past Surgical History:  ?Procedure Laterality Date  ? COLONOSCOPY    ? FOOT SURGERY    ? Right plantar facsiitis scrape  ? KNEE SURGERY Bilateral 2007  ? LAPAROSCOPIC GASTRIC SLEEVE  RESECTION N/A 03/30/2017  ? Procedure: LAPAROSCOPIC GASTRIC SLEEVE RESECTION, UPPER ENDO;  Surgeon: Greer Pickerel, MD;  Location: [facility];  Service: General;  Laterality: N/A;  ? NASAL SINUS SURGERY  2004  ? OVARIAN CYST REMOVAL  1998  ? A cyst on the fallopian tube removed  ? SPINAL FUSION    ? TONSILLECTOMY  1996  ? ? ?SOCIAL HISTORY: ?Social History  ? ?Socioeconomic History  ? Marital status: Married  ?  Spouse name: Not on file  ? Number of children: 0  ? Years of education: Not on file  ? Highest education level: Not on file  ?Occupational History  ? Occupation: disabled  ?  Employer: UNEMPLOYED  ?Tobacco Use  ? Smoking status: Never  ? Smokeless tobacco: Never  ?Vaping Use  ? Vaping  Use: Never used  ?Substance and Sexual Activity  ? Alcohol use: No  ? Drug use: No  ? Sexual activity: Yes  ?  Birth control/protection: None  ?Other Topics Concern  ? Not on file  ?Social History Narrative  ? Not on file  ? ?Social Determinants of Health  ? ?Financial Resource Strain: Not on file  ?Food Insecurity: Not on file  ?Transportation Needs: Not on file  ?Physical Activity: Not on file  ?Stress: Not on file  ?Social Connections: Not on file  ?Intimate Partner Violence: Not on file  ? ? ?FAMILY HISTORY: ?Family History  ?Problem Relation Age of Onset  ? Cancer Father   ?     Leukemia  ? Diabetes Father   ? Heart disease Father   ?     first PCI in his 83s  ? Diabetes Mother   ? Cancer Maternal Grandmother   ?     Stomach  ? Colon cancer Maternal Grandmother   ? Cancer Paternal Grandmother   ? Cancer Paternal Aunt   ? Diabetes Paternal Aunt   ? Cancer Paternal Uncle   ? Cancer Paternal Aunt   ? Rectal cancer Neg Hx   ? Stomach cancer Neg Hx   ? Esophageal cancer Neg Hx   ? ? ?ALLERGIES:  is allergic to carbamazepine, gabapentin, pineapple, shellfish-derived products, erythromycin, latex, pregabalin, duloxetine, other, and pollen extract. ? ?MEDICATIONS:  ?Current Outpatient Medications  ?Medication Sig Dispense Refill  ? albuterol (VENTOLIN HFA) 108 (90 Base) MCG/ACT inhaler Inhale 2 puffs into the lungs every 4 (four) hours as needed for wheezing or shortness of breath.    ? ALPRAZolam (XANAX XR) 1 MG 24 hr tablet Take 1 mg by mouth 2 (two) times daily.    ? ALPRAZolam (XANAX) 1 MG tablet Take 1 mg by mouth every morning.    ? atomoxetine (STRATTERA) 40 MG capsule Take by mouth every morning.    ? Biotin 2500 MCG CAPS Take by mouth daily in the afternoon.    ? dexlansoprazole (DEXILANT) 60 MG capsule Take 60 mg by mouth daily.    ? EPINEPHrine 0.3 mg/0.3 mL IJ SOAJ injection Inject into the muscle.    ? escitalopram (LEXAPRO) 20 MG tablet Take 1 tablet (20 mg total) by mouth daily with breakfast. 30 tablet  3  ? estradiol (ESTRACE) 1 MG tablet     ? fentaNYL (DURAGESIC - DOSED MCG/HR) 50 MCG/HR Place 50 mcg onto the skin every 3 (three) days.     ? FIBER PO Take 5 mg by mouth daily.    ? fluconazole (DIFLUCAN) 150 MG tablet TAKE 1 TABLET (150 MG TOTAL) BY MOUTH EVERY 3 (  THREE) DAYS 5 tablet 0  ? fluticasone (FLONASE) 50 MCG/ACT nasal spray INSTILL 1 SPRAY IN EACH NOSTRIL ONCE DAILY AS NEEDED FOR ALLERGIES (Patient taking differently: Place 1 spray into both nostrils daily as needed for allergies.) 16 mL 12  ? fluticasone (FLOVENT HFA) 110 MCG/ACT inhaler Inhale 2 puffs into the lungs in the morning and at bedtime. 1 each 12  ? HYDROmorphone (DILAUDID) 2 MG tablet Take 2 mg by mouth every 6 (six) hours.    ? lidocaine (XYLOCAINE) 2 % jelly Apply 1 application topically as needed. 30 mL 2  ? linaCLOtide (LINZESS PO)     ? medroxyPROGESTERone (PROVERA) 2.5 MG tablet Take 1 tablet (2.5 mg total) by mouth daily. 90 tablet 5  ? Melatonin 10 MG CAPS Take by mouth at bedtime.    ? montelukast (SINGULAIR) 10 MG tablet Take 10 mg by mouth at bedtime.    ? MOVANTIK 25 MG TABS tablet Take 25 mg by mouth at bedtime.     ? Multiple Vitamins-Minerals (MULTIVITAMIN GUMMIES WOMENS PO) Take by mouth daily in the afternoon.    ? naloxone (NARCAN) nasal spray 4 mg/0.1 mL SMARTSIG:Both Nares    ? pantoprazole (PROTONIX) 40 MG tablet Take 1 tablet (40 mg total) by mouth daily. Office visit for further refills 90 tablet 0  ? QUEtiapine (SEROQUEL) 100 MG tablet Take by mouth.    ? rizatriptan (MAXALT) 10 MG tablet Take 1 tablet by mouth as needed.    ? Sennosides (SENOKOT PO) Take 2 tablets by mouth at bedtime. 2 tablets at night     ? temazepam (RESTORIL) 30 MG capsule Take 30 mg by mouth at bedtime as needed.    ? tiZANidine (ZANAFLEX) 2 MG tablet Take 2 mg by mouth 3 (three) times daily as needed.    ? traZODone (DESYREL) 100 MG tablet Take 300 mg by mouth at bedtime.    ? triamcinolone cream (KENALOG) 0.1 % APPLY 1 APPLICATION ON THE SKIN  AS DIRECTED APPLY 1-2X PER DAY TO ELBOWS    ? valACYclovir (VALTREX) 1000 MG tablet TAKE 1 TABLET BY MOUTH EVERY DAY (Patient taking differently: Take 1,000 mg by mouth daily.) 90 tablet 4  ? ?No curr

## 2021-10-18 ENCOUNTER — Telehealth: Payer: Self-pay | Admitting: Hematology and Oncology

## 2021-10-18 ENCOUNTER — Encounter (HOSPITAL_COMMUNITY): Payer: Self-pay | Admitting: *Deleted

## 2021-10-18 NOTE — Telephone Encounter (Signed)
Scheduled per 4/13 los, calender has been mailed for appt in one year ?

## 2021-12-25 ENCOUNTER — Other Ambulatory Visit: Payer: Self-pay | Admitting: Physical Medicine and Rehabilitation

## 2021-12-25 DIAGNOSIS — R519 Headache, unspecified: Secondary | ICD-10-CM

## 2022-01-05 IMAGING — MR MR LUMBAR SPINE WO/W CM
4 of 7 series · 27 of 48 positions shown · IV contrast (18 ML Multihance)
Comparison: Previous MRI from 12/14/2016.

CLINICAL DATA: Initial evaluation for chronic low back pain with
radiation into the hips, legs, and feet with associated numbness and
weakness in left foot. History of prior back surgery.

EXAM:
MRI LUMBAR SPINE WITHOUT AND WITH CONTRAST
TECHNIQUE: Multiplanar and multiecho pulse sequences of the lumbar spine were
obtained without and with intravenous contrast.
CONTRAST:  18mL MULTIHANCE GADOBENATE DIMEGLUMINE 529 MG/ML IV SOLN

[Series 3: T1 · sagittal · 4.0mm · 0.55mm/px · 3 of 13 slices shown (1 of 2)]
[im 1/13]
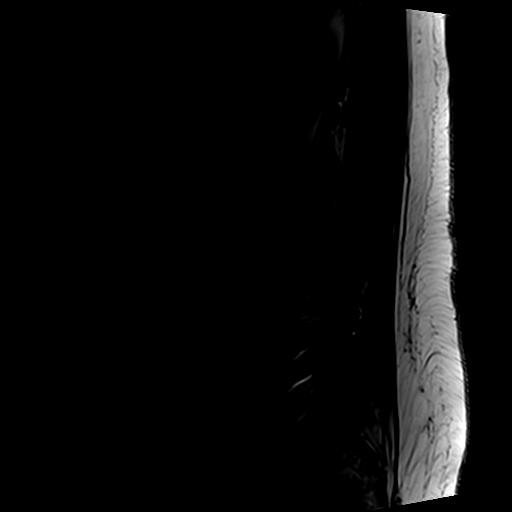
[im 7/13]
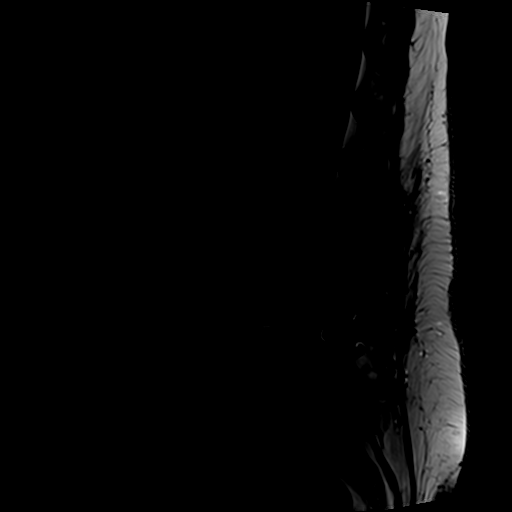
[im 13/13]
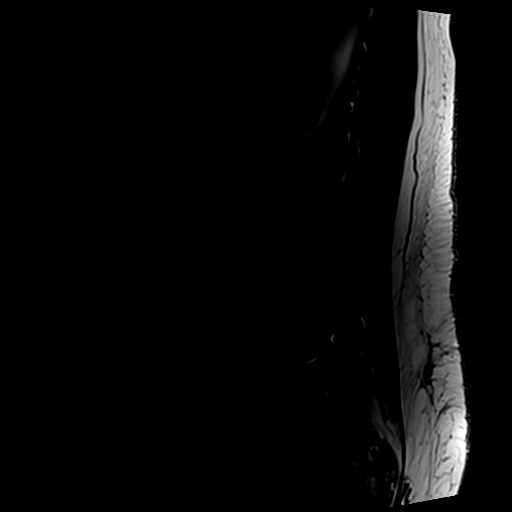

[Series 5: T2 · axial · 4.0mm · 0.70mm/px · z∈[-108,+106]mm · 11 of 38 slices shown (1 of 2)]
[im 1/38]
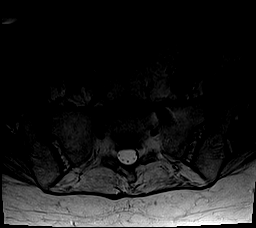
[im 4/38]
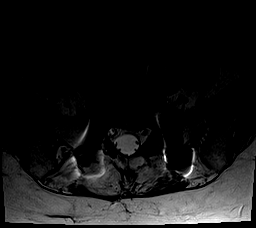
[im 8/38]
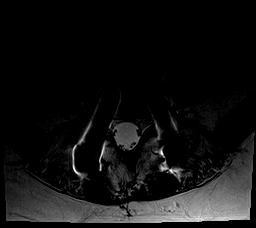
[im 12/38]
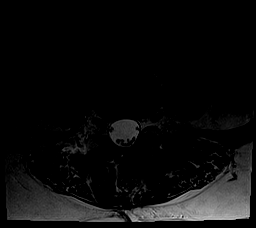
[im 15/38]
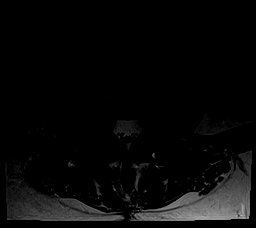
[im 19/38]
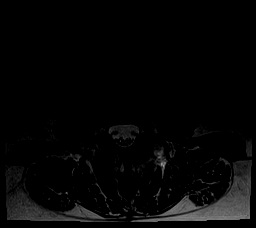
[im 23/38]
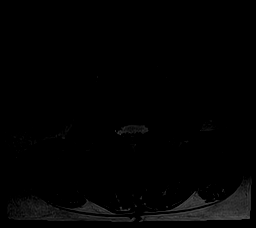
[im 26/38]
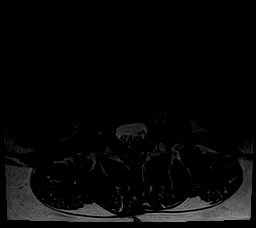
[im 30/38]
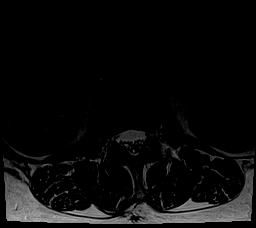
[im 34/38]
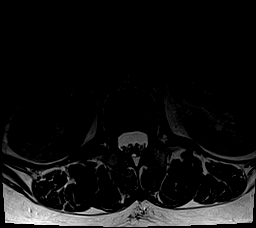
[im 38/38]
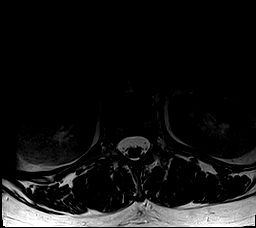

[Series 6: T1 · axial · 4.0mm · 0.35mm/px · z∈[-108,+85]mm · 9 of 38 slices shown (2 of 2)]
[im 1/38]
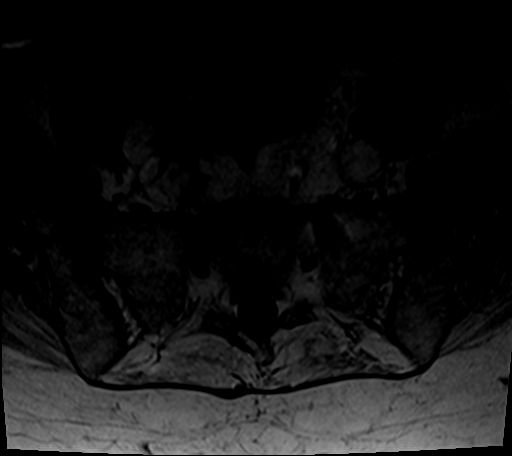
[im 4/38]
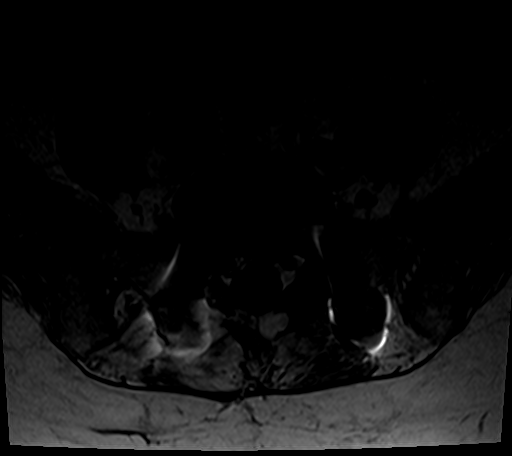
[im 8/38]
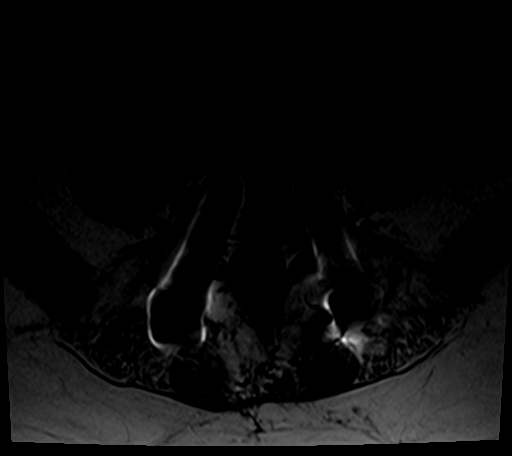
[im 12/38]
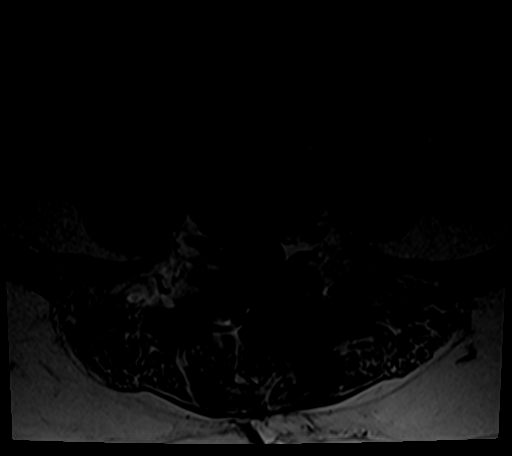
[im 15/38]
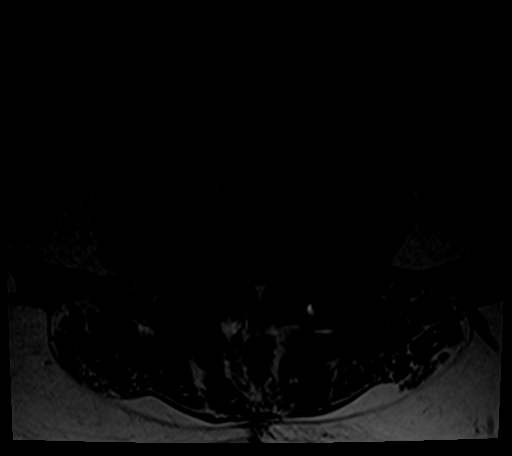
[im 19/38]
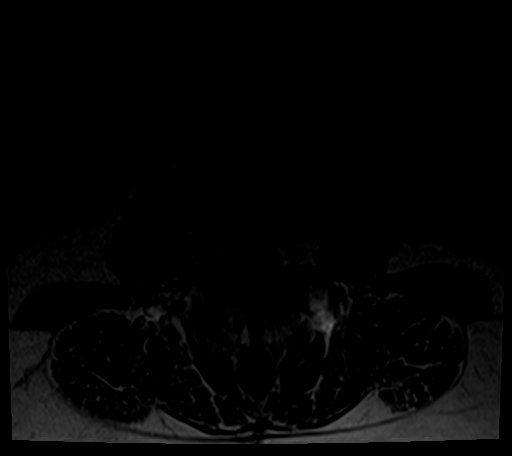
[im 23/38]
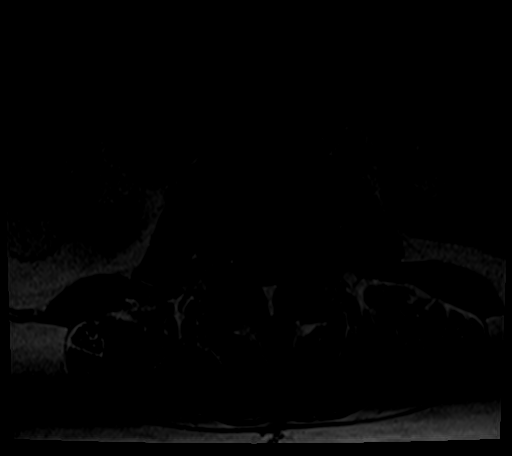
[im 26/38]
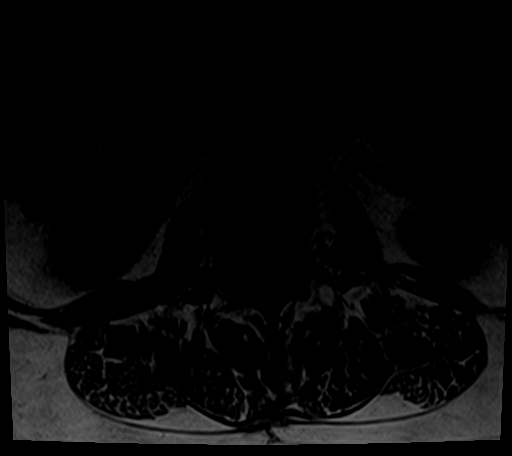
[im 34/38]
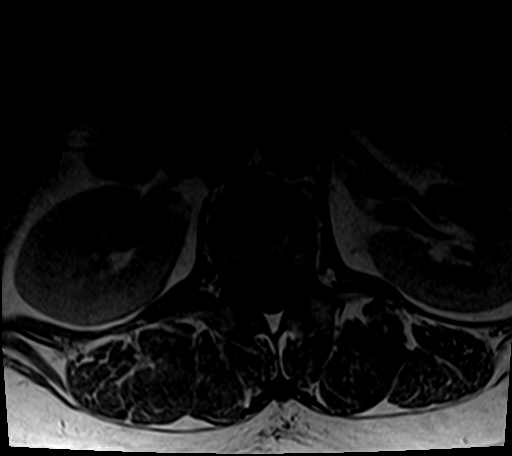

[Series 7: T2 · sagittal · 4.0mm · 0.55mm/px · 4 of 13 slices shown (2 of 2)]
[im 1/13]
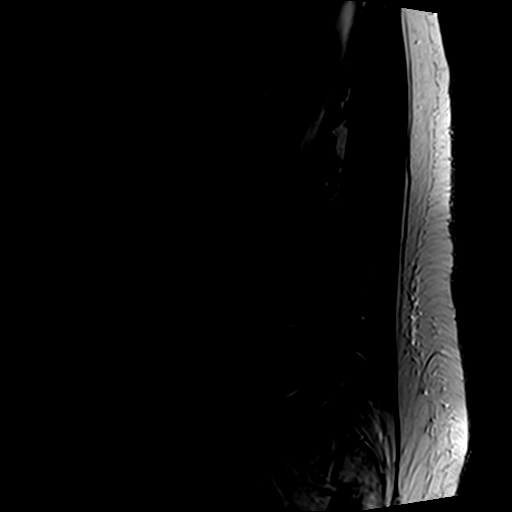
[im 5/13]
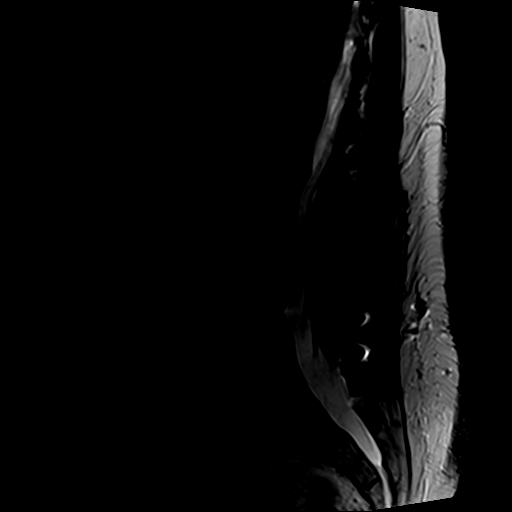
[im 9/13]
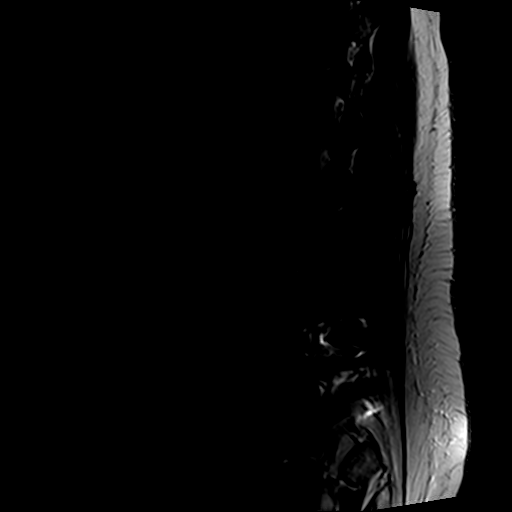
[im 13/13]
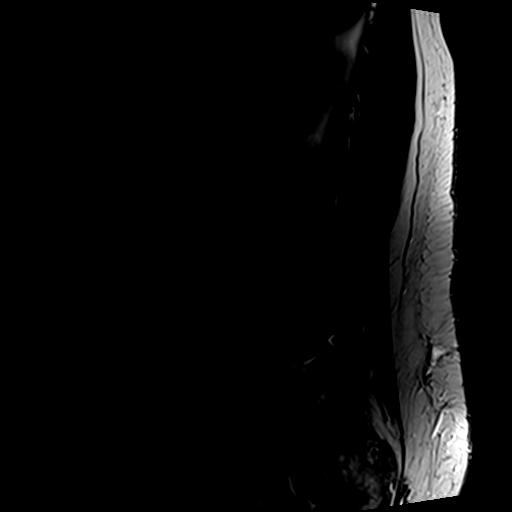

[27 of 48 positions shown; findings below may reference images not displayed]

FINDINGS: Segmentation: Standard. Lowest well-formed disc space labeled the
L5-S1 level.

Alignment: Trace retrolisthesis of L1 on L2 and L2 on L3, stable.
Underlying mild dextroscoliosis, also unchanged.

Vertebrae: Vertebral body height maintained without evidence for
acute or chronic fracture. Bone marrow signal intensity within
normal limits. Few scattered benign hemangiomata noted. No worrisome
osseous lesions. No abnormal marrow edema or enhancement.
Susceptibility artifact from prior posterior and interbody fusion at
L4-5 and L5-S1 with solid appearing arthrodesis.

Conus medullaris and cauda equina: Conus extends to the L1 level.
Conus and cauda equina appear normal.

Paraspinal and other soft tissues: Paraspinous soft tissues within
normal limits. Visualized visceral structures are normal.

Disc levels:

L1-2: Trace retrolisthesis. Mild disc bulge with disc desiccation
and intervertebral disc space narrowing. Mild bilateral facet
hypertrophy. No canal or foraminal stenosis. No impingement.

L2-3: Trace retrolisthesis. Mild diffuse disc bulge with disc
desiccation and intervertebral disc space narrowing. Borderline mild
spinal stenosis. Foramina remain patent.

L3-4: Mild disc bulge with disc desiccation. Disc bulging asymmetric
to the left, closely approximating the exiting left L3 nerve root
(series 5, image 22). Mild bilateral facet hypertrophy. No
significant spinal stenosis. Foramina remain patent.

L4-5: Prior posterior and interbody fusion with posterior
decompression. No residual spinal stenosis. Foramina remain patent.

L5-S1: Prior posterior and interbody fusion with posterior
decompression. Residual central to left subarticular endplate
osseous ridging, mildly narrowing the left lateral recess. Central
canal widely patent. No foraminal narrowing.
IMPRESSION: 1. Postoperative changes from prior posterior and interbody fusion
at L4-5 and L5-S1 without residual or recurrent spinal stenosis.
2. Residual left subarticular endplate osseous ridging at L5-S1 with
secondary mild left lateral recess stenosis.
3. Mild left eccentric disc bulge at L3-4, closely approximating the
exiting left L3 nerve root without frank neural impingement.
4. Mild noncompressive disc bulging and facet hypertrophy at L1-2
and L2-3 without stenosis or impingement.

## 2022-01-29 ENCOUNTER — Other Ambulatory Visit: Payer: Medicare (Managed Care)

## 2022-02-05 ENCOUNTER — Other Ambulatory Visit: Payer: Medicare (Managed Care)

## 2022-02-10 ENCOUNTER — Other Ambulatory Visit: Payer: Self-pay | Admitting: General Surgery

## 2022-02-10 DIAGNOSIS — E669 Obesity, unspecified: Secondary | ICD-10-CM

## 2022-04-17 IMAGING — DX DG ABDOMEN 1V
1 series · 1 of 1 positions shown · non-contrast
Comparison: None.

CLINICAL DATA: Constipation, left lower quadrant pain

EXAM:
ABDOMEN - 1 VIEW

[abdomen kub]
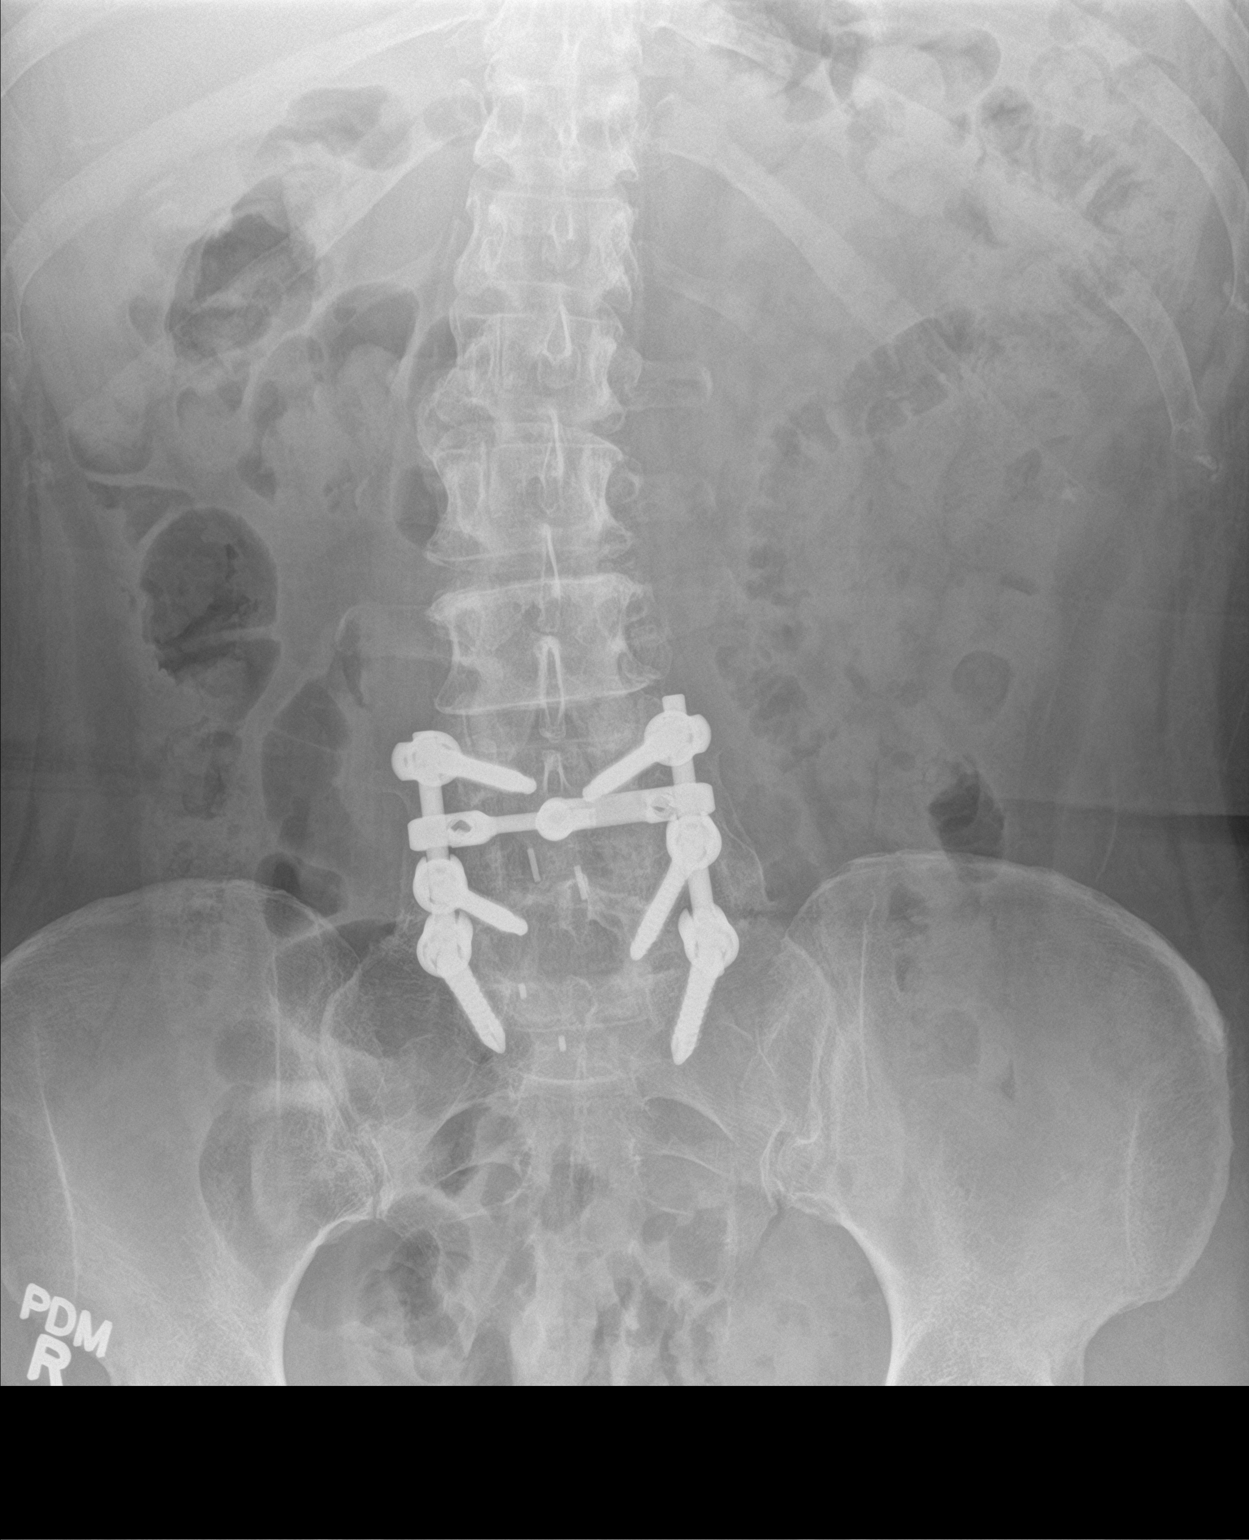

[1 of 1 positions shown; findings below may reference images not displayed]

FINDINGS: The bowel gas pattern is normal. Scattered stool throughout the
colon. No radio-opaque calculi or other significant radiographic
abnormality are seen.
IMPRESSION: Nonobstructive pattern of bowel gas. No radiographic findings to
explain left lower quadrant pain.

## 2022-06-20 ENCOUNTER — Encounter: Payer: Self-pay | Admitting: Hematology and Oncology

## 2022-06-20 ENCOUNTER — Encounter (HOSPITAL_COMMUNITY): Payer: Self-pay

## 2022-06-20 ENCOUNTER — Other Ambulatory Visit (HOSPITAL_COMMUNITY): Payer: Self-pay

## 2022-06-20 MED ORDER — HYDROMORPHONE HCL 2 MG PO TABS
2.0000 mg | ORAL_TABLET | Freq: Four times a day (QID) | ORAL | 0 refills | Status: DC
  Filled 2022-06-20: qty 120, 30d supply, fill #0

## 2022-06-21 ENCOUNTER — Other Ambulatory Visit (HOSPITAL_COMMUNITY): Payer: Self-pay

## 2022-07-09 DIAGNOSIS — J4541 Moderate persistent asthma with (acute) exacerbation: Secondary | ICD-10-CM | POA: Diagnosis not present

## 2022-07-16 DIAGNOSIS — F900 Attention-deficit hyperactivity disorder, predominantly inattentive type: Secondary | ICD-10-CM | POA: Diagnosis not present

## 2022-07-16 DIAGNOSIS — M1711 Unilateral primary osteoarthritis, right knee: Secondary | ICD-10-CM | POA: Diagnosis not present

## 2022-07-16 DIAGNOSIS — M47816 Spondylosis without myelopathy or radiculopathy, lumbar region: Secondary | ICD-10-CM | POA: Diagnosis not present

## 2022-07-16 DIAGNOSIS — Z79891 Long term (current) use of opiate analgesic: Secondary | ICD-10-CM | POA: Diagnosis not present

## 2022-07-16 DIAGNOSIS — M62830 Muscle spasm of back: Secondary | ICD-10-CM | POA: Diagnosis not present

## 2022-07-16 DIAGNOSIS — G894 Chronic pain syndrome: Secondary | ICD-10-CM | POA: Diagnosis not present

## 2022-07-16 DIAGNOSIS — F339 Major depressive disorder, recurrent, unspecified: Secondary | ICD-10-CM | POA: Diagnosis not present

## 2022-07-17 DIAGNOSIS — H524 Presbyopia: Secondary | ICD-10-CM | POA: Diagnosis not present

## 2022-07-17 DIAGNOSIS — H52223 Regular astigmatism, bilateral: Secondary | ICD-10-CM | POA: Diagnosis not present

## 2022-07-17 DIAGNOSIS — H5213 Myopia, bilateral: Secondary | ICD-10-CM | POA: Diagnosis not present

## 2022-07-21 DIAGNOSIS — M47816 Spondylosis without myelopathy or radiculopathy, lumbar region: Secondary | ICD-10-CM | POA: Diagnosis not present

## 2022-07-24 DIAGNOSIS — F3181 Bipolar II disorder: Secondary | ICD-10-CM | POA: Diagnosis not present

## 2022-07-24 DIAGNOSIS — G894 Chronic pain syndrome: Secondary | ICD-10-CM | POA: Diagnosis not present

## 2022-07-24 DIAGNOSIS — F900 Attention-deficit hyperactivity disorder, predominantly inattentive type: Secondary | ICD-10-CM | POA: Diagnosis not present

## 2022-08-01 DIAGNOSIS — M1711 Unilateral primary osteoarthritis, right knee: Secondary | ICD-10-CM | POA: Diagnosis not present

## 2022-08-01 DIAGNOSIS — M62830 Muscle spasm of back: Secondary | ICD-10-CM | POA: Diagnosis not present

## 2022-08-01 DIAGNOSIS — M961 Postlaminectomy syndrome, not elsewhere classified: Secondary | ICD-10-CM | POA: Diagnosis not present

## 2022-08-01 DIAGNOSIS — M1612 Unilateral primary osteoarthritis, left hip: Secondary | ICD-10-CM | POA: Diagnosis not present

## 2022-08-06 DIAGNOSIS — F3181 Bipolar II disorder: Secondary | ICD-10-CM | POA: Diagnosis not present

## 2022-08-06 DIAGNOSIS — F900 Attention-deficit hyperactivity disorder, predominantly inattentive type: Secondary | ICD-10-CM | POA: Diagnosis not present

## 2022-08-18 DIAGNOSIS — L920 Granuloma annulare: Secondary | ICD-10-CM | POA: Diagnosis not present

## 2022-08-18 DIAGNOSIS — Z79899 Other long term (current) drug therapy: Secondary | ICD-10-CM | POA: Diagnosis not present

## 2022-08-18 DIAGNOSIS — I788 Other diseases of capillaries: Secondary | ICD-10-CM | POA: Diagnosis not present

## 2022-08-21 DIAGNOSIS — F900 Attention-deficit hyperactivity disorder, predominantly inattentive type: Secondary | ICD-10-CM | POA: Diagnosis not present

## 2022-08-21 DIAGNOSIS — F3181 Bipolar II disorder: Secondary | ICD-10-CM | POA: Diagnosis not present

## 2022-08-26 DIAGNOSIS — F3181 Bipolar II disorder: Secondary | ICD-10-CM | POA: Diagnosis not present

## 2022-08-26 DIAGNOSIS — F900 Attention-deficit hyperactivity disorder, predominantly inattentive type: Secondary | ICD-10-CM | POA: Diagnosis not present

## 2022-09-03 DIAGNOSIS — F3181 Bipolar II disorder: Secondary | ICD-10-CM | POA: Diagnosis not present

## 2022-09-03 DIAGNOSIS — F900 Attention-deficit hyperactivity disorder, predominantly inattentive type: Secondary | ICD-10-CM | POA: Diagnosis not present

## 2022-09-11 DIAGNOSIS — M62830 Muscle spasm of back: Secondary | ICD-10-CM | POA: Diagnosis not present

## 2022-09-11 DIAGNOSIS — G894 Chronic pain syndrome: Secondary | ICD-10-CM | POA: Diagnosis not present

## 2022-09-11 DIAGNOSIS — M1711 Unilateral primary osteoarthritis, right knee: Secondary | ICD-10-CM | POA: Diagnosis not present

## 2022-09-11 DIAGNOSIS — M47816 Spondylosis without myelopathy or radiculopathy, lumbar region: Secondary | ICD-10-CM | POA: Diagnosis not present

## 2022-09-16 ENCOUNTER — Other Ambulatory Visit (HOSPITAL_COMMUNITY): Payer: Self-pay

## 2022-09-20 ENCOUNTER — Encounter: Payer: Self-pay | Admitting: Hematology and Oncology

## 2022-09-24 DIAGNOSIS — F3181 Bipolar II disorder: Secondary | ICD-10-CM | POA: Diagnosis not present

## 2022-09-24 DIAGNOSIS — F900 Attention-deficit hyperactivity disorder, predominantly inattentive type: Secondary | ICD-10-CM | POA: Diagnosis not present

## 2022-09-30 DIAGNOSIS — F900 Attention-deficit hyperactivity disorder, predominantly inattentive type: Secondary | ICD-10-CM | POA: Diagnosis not present

## 2022-09-30 DIAGNOSIS — F3181 Bipolar II disorder: Secondary | ICD-10-CM | POA: Diagnosis not present

## 2022-10-01 DIAGNOSIS — L400 Psoriasis vulgaris: Secondary | ICD-10-CM | POA: Diagnosis not present

## 2022-10-01 DIAGNOSIS — F3181 Bipolar II disorder: Secondary | ICD-10-CM | POA: Diagnosis not present

## 2022-10-01 DIAGNOSIS — F900 Attention-deficit hyperactivity disorder, predominantly inattentive type: Secondary | ICD-10-CM | POA: Diagnosis not present

## 2022-10-03 ENCOUNTER — Encounter: Payer: Self-pay | Admitting: Hematology and Oncology

## 2022-10-08 DIAGNOSIS — F900 Attention-deficit hyperactivity disorder, predominantly inattentive type: Secondary | ICD-10-CM | POA: Diagnosis not present

## 2022-10-08 DIAGNOSIS — F3181 Bipolar II disorder: Secondary | ICD-10-CM | POA: Diagnosis not present

## 2022-10-09 DIAGNOSIS — M62830 Muscle spasm of back: Secondary | ICD-10-CM | POA: Diagnosis not present

## 2022-10-09 DIAGNOSIS — M1711 Unilateral primary osteoarthritis, right knee: Secondary | ICD-10-CM | POA: Diagnosis not present

## 2022-10-09 DIAGNOSIS — J339 Nasal polyp, unspecified: Secondary | ICD-10-CM | POA: Diagnosis not present

## 2022-10-09 DIAGNOSIS — M47816 Spondylosis without myelopathy or radiculopathy, lumbar region: Secondary | ICD-10-CM | POA: Diagnosis not present

## 2022-10-09 DIAGNOSIS — G894 Chronic pain syndrome: Secondary | ICD-10-CM | POA: Diagnosis not present

## 2022-10-14 DIAGNOSIS — F3181 Bipolar II disorder: Secondary | ICD-10-CM | POA: Diagnosis not present

## 2022-10-14 DIAGNOSIS — F900 Attention-deficit hyperactivity disorder, predominantly inattentive type: Secondary | ICD-10-CM | POA: Diagnosis not present

## 2022-10-20 ENCOUNTER — Inpatient Hospital Stay: Payer: Medicare HMO | Attending: Hematology and Oncology

## 2022-10-20 ENCOUNTER — Other Ambulatory Visit: Payer: Self-pay

## 2022-10-20 ENCOUNTER — Inpatient Hospital Stay (HOSPITAL_BASED_OUTPATIENT_CLINIC_OR_DEPARTMENT_OTHER): Payer: Medicare HMO | Admitting: Hematology and Oncology

## 2022-10-20 ENCOUNTER — Other Ambulatory Visit: Payer: Self-pay | Admitting: Hematology and Oncology

## 2022-10-20 VITALS — BP 119/73 | HR 76 | Temp 98.2°F | Resp 14 | Wt 194.3 lb

## 2022-10-20 DIAGNOSIS — D508 Other iron deficiency anemias: Secondary | ICD-10-CM

## 2022-10-20 DIAGNOSIS — E611 Iron deficiency: Secondary | ICD-10-CM | POA: Insufficient documentation

## 2022-10-20 DIAGNOSIS — Z9884 Bariatric surgery status: Secondary | ICD-10-CM | POA: Diagnosis not present

## 2022-10-20 LAB — CBC WITH DIFFERENTIAL (CANCER CENTER ONLY)
Abs Immature Granulocytes: 0.03 10*3/uL (ref 0.00–0.07)
Basophils Absolute: 0.1 10*3/uL (ref 0.0–0.1)
Basophils Relative: 1 %
Eosinophils Absolute: 0.5 10*3/uL (ref 0.0–0.5)
Eosinophils Relative: 6 %
HCT: 38.7 % (ref 36.0–46.0)
Hemoglobin: 13.2 g/dL (ref 12.0–15.0)
Immature Granulocytes: 0 %
Lymphocytes Relative: 17 %
Lymphs Abs: 1.4 10*3/uL (ref 0.7–4.0)
MCH: 30.8 pg (ref 26.0–34.0)
MCHC: 34.1 g/dL (ref 30.0–36.0)
MCV: 90.2 fL (ref 80.0–100.0)
Monocytes Absolute: 0.7 10*3/uL (ref 0.1–1.0)
Monocytes Relative: 9 %
Neutro Abs: 5.6 10*3/uL (ref 1.7–7.7)
Neutrophils Relative %: 67 %
Platelet Count: 208 10*3/uL (ref 150–400)
RBC: 4.29 MIL/uL (ref 3.87–5.11)
RDW: 13.8 % (ref 11.5–15.5)
WBC Count: 8.3 10*3/uL (ref 4.0–10.5)
nRBC: 0 % (ref 0.0–0.2)

## 2022-10-20 LAB — CMP (CANCER CENTER ONLY)
ALT: 15 U/L (ref 0–44)
AST: 16 U/L (ref 15–41)
Albumin: 4.1 g/dL (ref 3.5–5.0)
Alkaline Phosphatase: 57 U/L (ref 38–126)
Anion gap: 5 (ref 5–15)
BUN: 12 mg/dL (ref 6–20)
CO2: 32 mmol/L (ref 22–32)
Calcium: 9.5 mg/dL (ref 8.9–10.3)
Chloride: 103 mmol/L (ref 98–111)
Creatinine: 0.83 mg/dL (ref 0.44–1.00)
GFR, Estimated: >60 mL/min (ref >60)
Glucose, Bld: 98 mg/dL (ref 70–99)
Potassium: 3.9 mmol/L (ref 3.5–5.1)
Sodium: 140 mmol/L (ref 135–145)
Total Bilirubin: 0.6 mg/dL (ref 0.3–1.2)
Total Protein: 6.8 g/dL (ref 6.5–8.1)

## 2022-10-20 LAB — IRON AND IRON BINDING CAPACITY (CC-WL,HP ONLY)
Iron: 56 ug/dL (ref 28–170)
Saturation Ratios: 19 % (ref 10.4–31.8)
TIBC: 302 ug/dL (ref 250–450)
UIBC: 246 ug/dL (ref 148–442)

## 2022-10-20 LAB — RETIC PANEL
Immature Retic Fract: 3.5 % (ref 2.3–15.9)
RBC.: 4.27 MIL/uL (ref 3.87–5.11)
Retic Count, Absolute: 32.5 10*3/uL (ref 19.0–186.0)
Retic Ct Pct: 0.8 % (ref 0.4–3.1)
Reticulocyte Hemoglobin: 34.5 pg (ref >27.9)

## 2022-10-20 LAB — FERRITIN: Ferritin: 78 ng/mL (ref 11–307)

## 2022-10-20 NOTE — Progress Notes (Signed)
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  1) 01/01/2020: WBC 8.0, Hgb 12.8, MCV 86.9, Plt 284 2) 01/12/2020: establish care with Dr. Leonides Schanz  3) 7/16-7/23/2021:  2 doses of IV feraheme 4) 10/01/2020: WBC 6.9, Hgb 14.2, MCV 91.3, Plt 230 5) 04/04/2021: WBC 6.5, Hgb 13.8, MCV 89.7, Plt 226, Ferritin 75 6) 10/17/2021: WBC 9.8, Hgb 14.0, MCV 89.6, Plt 212, Ferritin 41   Interval History:  [name] 57 y.o. 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 The patient's last visit was on 10/17/2021. In the interim since the last visit she has had no major changes in her health.    On exam today [name] notes she has been "okay" in the interim since our last visit.  She has had no major changes in her health.  She reports that they decreased the dose of her fentanyl and she can "feel it".  She notes her energy levels have just been "okay".  She reports that about a 7 out of 10 today.  Her appetite has been poor and she is not eating well.  She notes has not been having any ice cravings but does endorse issues with headache, dizziness, and occasional shortness of breath.  She reports that she has had no issues with bleeding but does have some occasional bruising.  She reports that for her arthritis and skin rash she will be started on Humira in the near future.  She currently denies having any issues with fevers, chills, sweats, nausea, chest pain, or  shortness of breath.  A full 10 point ROS is listed below.  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   Obesity 07/14/2012   OSA (obstructive sleep apnea)    mild   Pneumonia    Polycystic disease, ovaries    Postoperative anemia    2018 - while dieting  hx of   Reflux    Sinus tachycardia 07/14/2012   Pt says HR > 100 is consistent   Sleep apnea    has had two tests, different results- no CPAP used    SURGICAL HISTORY: Past Surgical History:  Procedure Laterality Date   COLONOSCOPY     FOOT SURGERY     Right plantar facsiitis scrape   KNEE SURGERY Bilateral 2007   LAPAROSCOPIC GASTRIC SLEEVE RESECTION N/A 03/30/2017   Procedure: LAPAROSCOPIC GASTRIC SLEEVE RESECTION,  UPPER ENDO;  Surgeon: Gaynelle Adu, MD;  Location: [facility];  Service: General;  Laterality: N/A;   NASAL SINUS SURGERY  2004   OVARIAN CYST REMOVAL  1998   A cyst on the fallopian tube removed   SPINAL FUSION     TONSILLECTOMY  1996    SOCIAL HISTORY: Social History   Socioeconomic History   Marital status: Married    Spouse name: Not on file   Number of children: 0   Years of education: Not on file   Highest education level: Not on file  Occupational History   Occupation: disabled    Employer: UNEMPLOYED  Tobacco Use   Smoking status: Never   Smokeless tobacco: Never  Vaping Use   Vaping Use: Never used  Substance and Sexual Activity   Alcohol use: No   Drug  use: No   Sexual activity: Yes    Birth control/protection: None  Other Topics Concern   Not on file  Social History Narrative   Not on file   Social Determinants of Health   Financial Resource Strain: Not on file  Food Insecurity: Food Insecurity Present (05/26/2017)   Hunger Vital Sign    Worried About Running Out of Food in the Last Year: Sometimes true    Ran Out of Food in the Last Year: Never true  Transportation Needs: Not on file  Physical Activity: Not on file  Stress: Not on file  Social Connections: Not on file  Intimate Partner Violence: Not on file    FAMILY HISTORY: Family History  Problem Relation Age of Onset   Cancer Father        Leukemia   Diabetes Father    Heart disease Father        first PCI in his 56s   Diabetes Mother    Cancer Maternal Grandmother        Stomach   Colon cancer Maternal Grandmother    Cancer Paternal Grandmother    Cancer Paternal Aunt    Diabetes Paternal Aunt    Cancer Paternal Uncle    Cancer Paternal Aunt    Rectal cancer Neg Hx    Stomach cancer Neg Hx    Esophageal cancer Neg Hx     ALLERGIES:  is allergic to carbamazepine, gabapentin, pineapple, shellfish-derived products, erythromycin, latex, pregabalin, duloxetine, other, and pollen extract.  MEDICATIONS:  Current Outpatient Medications  Medication Sig Dispense Refill   albuterol (VENTOLIN HFA) 108 (90 Base) MCG/ACT inhaler Inhale 2 puffs into the lungs every 4 (four) hours as needed for wheezing or shortness of breath.     ALPRAZolam (XANAX XR) 1 MG 24 hr tablet Take 1 mg by mouth 2 (two) times daily.     ALPRAZolam (XANAX) 1 MG tablet Take 1 mg by mouth every morning.     atomoxetine (STRATTERA) 40 MG capsule Take by mouth every morning.     Biotin 2500 MCG CAPS Take by mouth daily in the afternoon.     dexlansoprazole (DEXILANT) 60 MG capsule Take 60 mg by mouth daily.     EPINEPHrine 0.3 mg/0.3 mL IJ SOAJ injection Inject into the muscle.     escitalopram  (LEXAPRO) 20 MG tablet Take 1 tablet (20 mg total) by mouth daily with breakfast. 30 tablet 3   estradiol (ESTRACE) 1 MG tablet      fentaNYL (DURAGESIC - DOSED MCG/HR) 50 MCG/HR Place 50 mcg onto the skin every 3 (three) days.      FIBER PO Take  5 mg by mouth daily.     fluconazole (DIFLUCAN) 150 MG tablet TAKE 1 TABLET (150 MG TOTAL) BY MOUTH EVERY 3 (THREE) DAYS 5 tablet 0   fluticasone (FLONASE) 50 MCG/ACT nasal spray INSTILL 1 SPRAY IN EACH NOSTRIL ONCE DAILY AS NEEDED FOR ALLERGIES (Patient taking differently: Place 1 spray into both nostrils daily as needed for allergies.) 16 mL 12   fluticasone (FLOVENT HFA) 110 MCG/ACT inhaler Inhale 2 puffs into the lungs in the morning and at bedtime. 1 each 12   HYDROmorphone (DILAUDID) 2 MG tablet Take 2 mg by mouth every 6 (six) hours.     HYDROmorphone (DILAUDID) 2 MG tablet Take 1 tablet (2 mg total) by mouth every 6 (six) hours. 120 tablet 0   lidocaine (XYLOCAINE) 2 % jelly Apply 1 application topically as needed. 30 mL 2   linaCLOtide (LINZESS PO)      medroxyPROGESTERone (PROVERA) 2.5 MG tablet Take 1 tablet (2.5 mg total) by mouth daily. 90 tablet 5   Melatonin 10 MG CAPS Take by mouth at bedtime.     montelukast (SINGULAIR) 10 MG tablet Take 10 mg by mouth at bedtime.     MOVANTIK 25 MG TABS tablet Take 25 mg by mouth at bedtime.      Multiple Vitamins-Minerals (MULTIVITAMIN GUMMIES WOMENS PO) Take by mouth daily in the afternoon.     naloxone (NARCAN) nasal spray 4 mg/0.1 mL SMARTSIG:Both Nares     pantoprazole (PROTONIX) 40 MG tablet Take 1 tablet (40 mg total) by mouth daily. Office visit for further refills 90 tablet 0   QUEtiapine (SEROQUEL) 100 MG tablet Take by mouth.     rizatriptan (MAXALT) 10 MG tablet Take 1 tablet by mouth as needed.     Sennosides (SENOKOT PO) Take 2 tablets by mouth at bedtime. 2 tablets at night      temazepam (RESTORIL) 30 MG capsule Take 30 mg by mouth at bedtime as needed.     tiZANidine (ZANAFLEX) 2 MG  tablet Take 2 mg by mouth 3 (three) times daily as needed.     traZODone (DESYREL) 100 MG tablet Take 300 mg by mouth at bedtime.     triamcinolone cream (KENALOG) 0.1 % APPLY 1 APPLICATION ON THE SKIN AS DIRECTED APPLY 1-2X PER DAY TO ELBOWS     valACYclovir (VALTREX) 1000 MG tablet TAKE 1 TABLET BY MOUTH EVERY DAY (Patient taking differently: Take 1,000 mg by mouth daily.) 90 tablet 4   No current facility-administered medications for this visit.    REVIEW OF SYSTEMS:   Constitutional: ( - ) fevers, ( - )  chills , ( - ) night sweats Eyes: ( - ) blurriness of vision, ( - ) double vision, ( - ) watery eyes Ears, nose, mouth, throat, and face: ( - ) mucositis, ( - ) sore throat Respiratory: ( - ) cough, ( - ) dyspnea, ( - ) wheezes Cardiovascular: ( - ) palpitation, ( - ) chest discomfort, ( - ) lower extremity swelling Gastrointestinal:  ( - ) nausea, ( - ) heartburn, ( - ) change in bowel habits Skin: ( - ) abnormal skin rashes Lymphatics: ( - ) new lymphadenopathy, ( - ) easy bruising Neurological: ( - ) numbness, ( - ) tingling, ( - ) new weaknesses Behavioral/Psych: ( - ) mood change, ( - ) new changes  All other systems were reviewed with the patient and are negative.  PHYSICAL EXAMINATION: ECOG PERFORMANCE STATUS: 1 - Symptomatic but completely  ambulatory  There were no vitals filed for this visit.  There were no vitals filed for this visit.   GENERAL: well appearing middle aged Caucasian female, alert, no distress and comfortable SKIN: skin color, texture, turgor are normal, no rashes or significant lesions EYES: conjunctiva are pink and non-injected, sclera clear LUNGS: clear to auscultation and percussion with normal breathing effort HEART: regular rate & rhythm and no murmurs and no lower extremity edema Musculoskeletal: no cyanosis of digits and no clubbing  PSYCH: alert & oriented x 3, fluent speech NEURO: no focal motor/sensory deficits  LABORATORY DATA:  I have  reviewed the data as listed    Latest Ref Rng & Units 10/17/2021   10:12 AM 04/04/2021   10:11 AM 10/01/2020    8:54 AM  CBC  WBC 4.0 - 10.5 K/uL 9.8  6.5  6.9   Hemoglobin 12.0 - 15.0 g/dL 16.1  09.6  04.5   Hematocrit 36.0 - 46.0 % 42.4  41.1  43.8   Platelets 150 - 400 K/uL 212  226  230        Latest Ref Rng & Units 10/17/2021   10:12 AM 04/04/2021   10:11 AM 10/01/2020    8:54 AM  CMP  Glucose 70 - 99 mg/dL 85  94  77   BUN 6 - 20 mg/dL 7  7  8    Creatinine 0.44 - 1.00 mg/dL 4.09  8.11  9.14   Sodium 135 - 145 mmol/L 141  141  141   Potassium 3.5 - 5.1 mmol/L 3.7  3.9  3.7   Chloride 98 - 111 mmol/L 104  105  104   CO2 22 - 32 mmol/L 33  27  27   Calcium 8.9 - 10.3 mg/dL 9.1  9.4  9.1   Total Protein 6.5 - 8.1 g/dL 7.0  6.9  7.6   Total Bilirubin 0.3 - 1.2 mg/dL 0.8  0.6  0.5   Alkaline Phos 38 - 126 U/L 66  66  73   AST 15 - 41 U/L 12  15  50   ALT 0 - 44 U/L 7  11  24      RADIOGRAPHIC STUDIES: No results found.  ASSESSMENT & PLAN [name] 57 y.o. 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    In the interim [name] has unfortunately had no improvement in her symptoms.  She had a near two-point increase in her hemoglobin with the iron therapy, but unfortunately she has not felt any benefit as result of this.  We did discuss prior to administration of IV iron therapy that treating iron deficiency without anemia is controversial and not a guarantee to improve symptoms.  At this time I would recommend that she continue to follow-up with her other physicians in order to help work-up her fatigue and other symptoms.  We will plan to see the patient back in approximately 12 months time to assure that her hemoglobin and iron levels remain in the normal range.  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   --normally IV iron therapy is reserved for patients with anemia, but in this patient's case she has iron deficiency due to poor absorption and dietary modifications towards foods with decreased iron. I think she is a good candidate for IV iron therapy to replete her stores when they get low.  --  labs today show WBC 8.3, Hgb 13.2, MCV 90.2, Plt 208 -- vitamin b12 and folate levels are WNL --completed IV feraheme  q 7days x 2 doses 7/16-7/23/2021 --RTC in 12 months unless more iron repletion therapy is needed.    No orders of the defined types were placed in this encounter.  All questions were answered. The patient knows to call the clinic with any problems, questions or concerns.  A total of more than 30 minutes were spent on this encounter and over half of that time was spent on counseling and coordination of care as outlined above.   Ulysees Barns, MD Department of Hematology/Oncology [facility] Phone: [phone] Pager: [phone] Email: [email or web address]  10/20/2022 6:27 AM

## 2022-10-22 ENCOUNTER — Telehealth: Payer: Self-pay | Admitting: *Deleted

## 2022-10-22 NOTE — Telephone Encounter (Signed)
PC to patient, informed her per Dr Leonides Schanz her iron & Hgb levels are excellent.  Informed her of next appointment 10/19/23.  She verbalizes understanding.

## 2022-10-22 NOTE — Telephone Encounter (Signed)
-----   Message from Kyra Searles, RN sent at 10/22/2022 10:48 AM EDT -----  ----- Message ----- From: Jaci Standard, MD Sent: 10/22/2022   7:47 AM EDT To: Kyra Searles, RN  Please let Mrs. Espino know that her iron levels and Hgb are excellent.  ----- Message ----- From: Leory Plowman, Lab In [facility] Sent: 10/20/2022  10:34 AM EDT To: Jaci Standard, MD

## 2022-10-23 ENCOUNTER — Encounter (HOSPITAL_COMMUNITY): Payer: Self-pay | Admitting: *Deleted

## 2022-10-23 DIAGNOSIS — F900 Attention-deficit hyperactivity disorder, predominantly inattentive type: Secondary | ICD-10-CM | POA: Diagnosis not present

## 2022-10-23 DIAGNOSIS — G894 Chronic pain syndrome: Secondary | ICD-10-CM | POA: Diagnosis not present

## 2022-10-23 DIAGNOSIS — F3130 Bipolar disorder, current episode depressed, mild or moderate severity, unspecified: Secondary | ICD-10-CM | POA: Diagnosis not present

## 2022-10-28 DIAGNOSIS — F3130 Bipolar disorder, current episode depressed, mild or moderate severity, unspecified: Secondary | ICD-10-CM | POA: Diagnosis not present

## 2022-10-28 DIAGNOSIS — F900 Attention-deficit hyperactivity disorder, predominantly inattentive type: Secondary | ICD-10-CM | POA: Diagnosis not present

## 2022-11-10 ENCOUNTER — Ambulatory Visit (INDEPENDENT_AMBULATORY_CARE_PROVIDER_SITE_OTHER): Payer: Medicare HMO

## 2022-11-10 ENCOUNTER — Encounter: Payer: Self-pay | Admitting: Family Medicine

## 2022-11-10 ENCOUNTER — Ambulatory Visit: Payer: Medicare HMO | Admitting: Family Medicine

## 2022-11-10 VITALS — BP 180/78 | HR 98 | Ht 66.0 in | Wt 193.0 lb

## 2022-11-10 DIAGNOSIS — W19XXXA Unspecified fall, initial encounter: Secondary | ICD-10-CM | POA: Diagnosis not present

## 2022-11-10 DIAGNOSIS — M25512 Pain in left shoulder: Secondary | ICD-10-CM | POA: Diagnosis not present

## 2022-11-10 DIAGNOSIS — R296 Repeated falls: Secondary | ICD-10-CM | POA: Diagnosis not present

## 2022-11-10 DIAGNOSIS — M542 Cervicalgia: Secondary | ICD-10-CM

## 2022-11-10 DIAGNOSIS — R0781 Pleurodynia: Secondary | ICD-10-CM | POA: Diagnosis not present

## 2022-11-10 DIAGNOSIS — R5383 Other fatigue: Secondary | ICD-10-CM

## 2022-11-10 DIAGNOSIS — R0789 Other chest pain: Secondary | ICD-10-CM

## 2022-11-10 DIAGNOSIS — J019 Acute sinusitis, unspecified: Secondary | ICD-10-CM | POA: Diagnosis not present

## 2022-11-10 DIAGNOSIS — R051 Acute cough: Secondary | ICD-10-CM | POA: Diagnosis not present

## 2022-11-10 DIAGNOSIS — H81399 Other peripheral vertigo, unspecified ear: Secondary | ICD-10-CM | POA: Diagnosis not present

## 2022-11-10 DIAGNOSIS — R0981 Nasal congestion: Secondary | ICD-10-CM | POA: Diagnosis not present

## 2022-11-10 LAB — CBC WITH DIFFERENTIAL/PLATELET
Basophils Absolute: 0.1 10*3/uL (ref 0.0–0.1)
Basophils Relative: 0.6 % (ref 0.0–3.0)
Eosinophils Absolute: 0 10*3/uL (ref 0.0–0.7)
Eosinophils Relative: 0.5 % (ref 0.0–5.0)
HCT: 40.4 % (ref 36.0–46.0)
Hemoglobin: 13.7 g/dL (ref 12.0–15.0)
Lymphocytes Relative: 4.9 % — ABNORMAL LOW (ref 12.0–46.0)
Lymphs Abs: 0.5 10*3/uL — ABNORMAL LOW (ref 0.7–4.0)
MCHC: 33.8 g/dL (ref 30.0–36.0)
MCV: 90.1 fl (ref 78.0–100.0)
Monocytes Absolute: 1.1 10*3/uL — ABNORMAL HIGH (ref 0.1–1.0)
Monocytes Relative: 10.6 % (ref 3.0–12.0)
Neutro Abs: 8.3 10*3/uL — ABNORMAL HIGH (ref 1.4–7.7)
Neutrophils Relative %: 83.4 % — ABNORMAL HIGH (ref 43.0–77.0)
Platelets: 234 10*3/uL (ref 150.0–400.0)
RBC: 4.48 Mil/uL (ref 3.87–5.11)
RDW: 14.2 % (ref 11.5–15.5)
WBC: 10 10*3/uL (ref 4.0–10.5)

## 2022-11-10 LAB — BASIC METABOLIC PANEL
BUN: 8 mg/dL (ref 6–23)
CO2: 30 mEq/L (ref 19–32)
Calcium: 8.7 mg/dL (ref 8.4–10.5)
Chloride: 96 mEq/L (ref 96–112)
Creatinine, Ser: 0.96 mg/dL (ref 0.40–1.20)
GFR: 66.55 mL/min (ref >60.00)
Glucose, Bld: 90 mg/dL (ref 70–99)
Potassium: 4.2 mEq/L (ref 3.5–5.1)
Sodium: 135 mEq/L (ref 135–145)

## 2022-11-10 NOTE — Progress Notes (Signed)
Rubin Payor, PhD, LAT, ATC acting as a scribe for Clementeen Graham, MD.  [name] is a 57 y.o. female who presents to [facility] at [facility] today for L shoulder pain. Pt was previously seen by Dr. Denyse Amass on 03/13/21 for chronic R knee pain.   Today, pt reports suffering a fall on Saturday when she was weeding, she stood up, got dizzy and fell. She tried 3 times to get up. She landed on her L-side. Pt locates pain to L proximal upper arm, around the L scapula, L-sided rib cage, and along the anterior-L-sided chest.  She notes weakness in her L hand.   Radiates: no Aggravates: any movement, breathing Treatments tried: heat, IBU, muscle relaxer  Pertinent review of systems: Positive for nasal congestion and and chills.  No fevers.  Relevant historical information: Chronic pain management.  Uses fentanyl and Dilaudid.  Dose has decreased in the last several months.  No other new medications.   Exam:  BP (!) 180/78   Pulse 98   Ht 5\' 6"  (1.676 m)   Wt 193 lb (87.5 kg)   LMP 08/22/2016 (Approximate)   SpO2 92%   BMI 31.15 kg/m  Orthostatic vital signs: Laying: Blood pressure 142/70.  Heart rate 94. Sitting 140/80 heart rate 102 Standing: Blood pressure 118/82 heart rate 104  General: Well Developed, well nourished, and in no acute distress.  Fatigued appearing MSK: C-spine nontender palpation spinal midline.  Decreased cervical motion. Upper extremity strength decreased left grip strength 4/5 otherwise upper extremity strength is intact. Left shoulder: Normal-appearing Tender palpation anterior upper arm.  Decreased range of motion.  Strength generally intact. Positive Hawkins and Neer's test.  Left ribs: Tender palpation left lateral ribs.  Heart: Regular rate and rhythm normal rhythm.  No murmurs rubs or gallops. Lungs: Clear to auscultation bilaterally.  Neuro: Impaired balance.  Fatigued with standing.  No lower extremity edema bilateral lower  extremities.  No calf swelling.  Lab and Radiology Results  X-ray images C-spine, left shoulder, chest with ribs left obtained today personally and independently interpreted.  C-spine: No acute fractures.  No severe DDD.  Left shoulder: No acute fractures are visible.  Left ribs and chest: No acute infiltrate or pneumothorax visible.  No displaced rib fracture visible per my read.  Await formal radiology review    Assessment and Plan: 57 y.o. female with fall.  Patient fell several times on Saturday and injured her left side.  I do not see any fractures on x-rays today.  I think her primary issue is contusions and perhaps a rotator cuff strain.  My more your concern is why she is falling.  She is fatigued and has orthostatic vital signs which probably is related to her fall.  She does have some orthostatic vital signs today which could explain why she is dizzy.  She is on a fair amount of multiple sedating medications including fentanyl and Dilaudid which could explain her fatigue and dizziness as well.  Medicines have not changed.  Plan for a little bit of watchful waiting and check back in a week.  Recommend she contact her PCP and get an appointment.  If she is any worse or not improving she should go to the emergency room.  She declined going today.  Will check some basic labs today as well.  PDMP reviewed during this encounter. Orders Placed This Encounter  Procedures   DG Shoulder Left    Standing Status:   Future  Number of Occurrences:   1    Standing Expiration Date:   12/11/2022    Order Specific Question:   Reason for Exam (SYMPTOM  OR DIAGNOSIS REQUIRED)    Answer:   left shoulder pain    Order Specific Question:   Preferred imaging location?    Answer:   [facility]    Order Specific Question:   Is patient pregnant?    Answer:   No   DG Ribs Unilateral W/Chest Left    Standing Status:   Future    Number of Occurrences:   1    Standing Expiration Date:    12/11/2022    Order Specific Question:   Reason for Exam (SYMPTOM  OR DIAGNOSIS REQUIRED)    Answer:   rib pain    Order Specific Question:   Preferred imaging location?    Answer:   [facility]    Order Specific Question:   Is patient pregnant?    Answer:   No   DG Cervical Spine 2 or 3 views    Standing Status:   Future    Number of Occurrences:   1    Standing Expiration Date:   11/10/2023    Order Specific Question:   Reason for Exam (SYMPTOM  OR DIAGNOSIS REQUIRED)    Answer:   neck pain    Order Specific Question:   Is patient pregnant?    Answer:   No    Order Specific Question:   Preferred imaging location?    Answer:   [facility]   CBC with Differential/Platelet    Standing Status:   Future    Number of Occurrences:   1    Standing Expiration Date:   03/13/2023   Basic Metabolic Panel (BMET)    Standing Status:   Future    Number of Occurrences:   1    Standing Expiration Date:   11/10/2023   No orders of the defined types were placed in this encounter.    Discussed warning signs or symptoms. Please see discharge instructions. Patient expresses understanding.   The above documentation has been reviewed and is accurate and complete Clementeen Graham, M.D.

## 2022-11-10 NOTE — Patient Instructions (Addendum)
Thank you for coming in today.   Keep active.   Please get labs today before you leave   If you are worsening go to the ER.   Follow up with your PCP.   Recheck in 1 week.

## 2022-11-11 NOTE — Progress Notes (Signed)
White blood cell count possibly indicates an infection somewhere.  Otherwise labs look okay.

## 2022-11-14 ENCOUNTER — Emergency Department (HOSPITAL_COMMUNITY): Payer: Medicare HMO

## 2022-11-14 ENCOUNTER — Emergency Department (HOSPITAL_COMMUNITY)
Admission: EM | Admit: 2022-11-14 | Discharge: 2022-11-14 | Disposition: A | Discharge status: Home / Self Care | Payer: Medicare HMO | Attending: Emergency Medicine | Admitting: Emergency Medicine

## 2022-11-14 ENCOUNTER — Encounter (HOSPITAL_COMMUNITY): Payer: Self-pay

## 2022-11-14 ENCOUNTER — Other Ambulatory Visit: Payer: Self-pay

## 2022-11-14 ENCOUNTER — Telehealth: Payer: Self-pay | Admitting: Family Medicine

## 2022-11-14 DIAGNOSIS — R0789 Other chest pain: Secondary | ICD-10-CM | POA: Insufficient documentation

## 2022-11-14 DIAGNOSIS — R079 Chest pain, unspecified: Secondary | ICD-10-CM

## 2022-11-14 DIAGNOSIS — R059 Cough, unspecified: Secondary | ICD-10-CM | POA: Insufficient documentation

## 2022-11-14 DIAGNOSIS — E876 Hypokalemia: Secondary | ICD-10-CM | POA: Diagnosis not present

## 2022-11-14 DIAGNOSIS — R0981 Nasal congestion: Secondary | ICD-10-CM | POA: Diagnosis not present

## 2022-11-14 DIAGNOSIS — Z91040 Latex allergy status: Secondary | ICD-10-CM | POA: Diagnosis not present

## 2022-11-14 DIAGNOSIS — J189 Pneumonia, unspecified organism: Secondary | ICD-10-CM | POA: Diagnosis not present

## 2022-11-14 LAB — I-STAT CHEM 8, ED
BUN: 7 mg/dL (ref 6–20)
Calcium, Ion: 1.11 mmol/L — ABNORMAL LOW (ref 1.15–1.40)
Chloride: 101 mmol/L (ref 98–111)
Creatinine, Ser: 0.7 mg/dL (ref 0.44–1.00)
Glucose, Bld: 96 mg/dL (ref 70–99)
HCT: 40 % (ref 36.0–46.0)
Hemoglobin: 13.6 g/dL (ref 12.0–15.0)
Potassium: 3.4 mmol/L — ABNORMAL LOW (ref 3.5–5.1)
Sodium: 139 mmol/L (ref 135–145)
TCO2: 27 mmol/L (ref 22–32)

## 2022-11-14 LAB — COMPREHENSIVE METABOLIC PANEL
ALT: 17 U/L (ref 0–44)
AST: 24 U/L (ref 15–41)
Albumin: 3.5 g/dL (ref 3.5–5.0)
Alkaline Phosphatase: 71 U/L (ref 38–126)
Anion gap: 9 (ref 5–15)
BUN: 6 mg/dL (ref 6–20)
CO2: 27 mmol/L (ref 22–32)
Calcium: 8.6 mg/dL — ABNORMAL LOW (ref 8.9–10.3)
Chloride: 100 mmol/L (ref 98–111)
Creatinine, Ser: 0.84 mg/dL (ref 0.44–1.00)
GFR, Estimated: >60 mL/min (ref >60)
Glucose, Bld: 98 mg/dL (ref 70–99)
Potassium: 3.3 mmol/L — ABNORMAL LOW (ref 3.5–5.1)
Sodium: 136 mmol/L (ref 135–145)
Total Bilirubin: 0.6 mg/dL (ref 0.3–1.2)
Total Protein: 6.8 g/dL (ref 6.5–8.1)

## 2022-11-14 LAB — CBC WITH DIFFERENTIAL/PLATELET
Abs Immature Granulocytes: 0.07 10*3/uL (ref 0.00–0.07)
Basophils Absolute: 0.1 10*3/uL (ref 0.0–0.1)
Basophils Relative: 1 %
Eosinophils Absolute: 0.2 10*3/uL (ref 0.0–0.5)
Eosinophils Relative: 4 %
HCT: 43.5 % (ref 36.0–46.0)
Hemoglobin: 13.9 g/dL (ref 12.0–15.0)
Immature Granulocytes: 1 %
Lymphocytes Relative: 28 %
Lymphs Abs: 1.4 10*3/uL (ref 0.7–4.0)
MCH: 29 pg (ref 26.0–34.0)
MCHC: 32 g/dL (ref 30.0–36.0)
MCV: 90.8 fL (ref 80.0–100.0)
Monocytes Absolute: 0.6 10*3/uL (ref 0.1–1.0)
Monocytes Relative: 11 %
Neutro Abs: 2.8 10*3/uL (ref 1.7–7.7)
Neutrophils Relative %: 55 %
Platelets: 242 10*3/uL (ref 150–400)
RBC: 4.79 MIL/uL (ref 3.87–5.11)
RDW: 13.2 % (ref 11.5–15.5)
WBC: 5 10*3/uL (ref 4.0–10.5)
nRBC: 0 % (ref 0.0–0.2)

## 2022-11-14 LAB — TROPONIN I (HIGH SENSITIVITY): Troponin I (High Sensitivity): 2 ng/L (ref <18)

## 2022-11-14 MED ORDER — MORPHINE SULFATE (PF) 4 MG/ML IV SOLN
4.0000 mg | Freq: Once | INTRAVENOUS | Status: AC
  Administered 2022-11-14: 4 mg via INTRAVENOUS
  Filled 2022-11-14: qty 1

## 2022-11-14 MED ORDER — IOHEXOL 350 MG/ML SOLN
75.0000 mL | Freq: Once | INTRAVENOUS | Status: AC | PRN
  Administered 2022-11-14: 75 mL via INTRAVENOUS

## 2022-11-14 MED ORDER — POTASSIUM CHLORIDE CRYS ER 20 MEQ PO TBCR
40.0000 meq | EXTENDED_RELEASE_TABLET | Freq: Once | ORAL | Status: AC
  Administered 2022-11-14: 40 meq via ORAL
  Filled 2022-11-14: qty 2

## 2022-11-14 NOTE — ED Notes (Signed)
AVS provided to and discussed with patient. Pt verbalizes understanding of discharge instructions and denies any questions or concerns at this time. Pt has ride home. Pt ambulated out of department independently with steady gait.  

## 2022-11-14 NOTE — ED Provider Notes (Signed)
4:16 PM Care assumed for Dr. Rodena Medin.  At time of transfer of care, patient is awaiting CT PE study results to rule out worsen pneumonia, rib fractures from the recent trauma, or any other causes of her chest discomfort.  Patient is already had a single troponin as this has been going on for multiple days previously and felt a single troponin was sufficient.  If workup is reassuring, plan of care is to discharge home for outpatient follow-up.  6:12 PM CT scan returned showing no evidence of acute pulm embolism.  No rib fractures.  No pneumothorax.  The imaging shows some opacities but clinically it does not seem like patient is having worsening infection at this time.  Vital signs reassuring on reassessment.  Patient will call her pulmonologist to discuss the workup and imaging findings to discuss ongoing plan.  Patient had no other questions or concerns and was discharged in good condition.   Clinical Impression: 1. Nonspecific chest pain     Disposition: Discharge  Condition: Good  I have discussed the results, Dx and Tx plan with the pt(& family if present). He/she/they expressed understanding and agree(s) with the plan. Discharge instructions discussed at great length. Strict return precautions discussed and pt &/or family have verbalized understanding of the instructions. No further questions at time of discharge.    New Prescriptions   No medications on file    Follow Up: No follow-up provider specified.     Antonae Zbikowski, Canary Brim, MD 11/14/22 [phone]

## 2022-11-14 NOTE — Progress Notes (Signed)
Rib and chest x-ray concerning for pneumonia.  This would explain why you have been feeling bad.  I think you should go to the emergency room.  I think you would benefit from further treatment.

## 2022-11-14 NOTE — Progress Notes (Signed)
Cervical spine x-ray shows arthritis.  No significant change from x-ray October 2023.

## 2022-11-14 NOTE — Discharge Instructions (Signed)
Your history, exam, workup today did not show evidence of pulm embolism, collapsed lung, rib fractures, or significantly worsened infection compared to prior.  It does still show evidence of some opacity and consolidation with likely resolving pneumonia.  Given the patient's reassuring labs and vital signs we feel she is safe for discharge home to call her pulmonologist to discuss ongoing management.  Patient had no other questions or concerns and was discharged in good condition.

## 2022-11-14 NOTE — ED Provider Notes (Signed)
[place] EMERGENCY DEPARTMENT AT [facility] Provider Note   CSN: [id number] Arrival date & time: 11/14/22  1247     History  Chief Complaint  Patient presents with   Chest Pain    [name] is a 57 y.o. female.  57 year old female with prior medical history as detailed below presents for evaluation.  Patient complains of persistent left-sided chest pain.  Patient reports that she fell approximately 1 week ago.  During her evaluation for this fall approximately 1 week ago she was told that she might have a pneumonia.  She was placed on Augmentin for same.  She presents today complaining of persistent left-sided chest discomfort.  This is associated with cough and congestion.  She denies fevers.    The history is provided by the patient and medical records.       Home Medications Prior to Admission medications   Medication Sig Start Date End Date Taking? Authorizing Provider  albuterol (VENTOLIN HFA) 108 (90 Base) MCG/ACT inhaler Inhale 2 puffs into the lungs every 4 (four) hours as needed for wheezing or shortness of breath.    [provider]  ALPRAZolam (XANAX XR) 1 MG 24 hr tablet Take 1 mg by mouth 2 (two) times daily.    [provider]  ALPRAZolam Prudy Feeler) 1 MG tablet Take 1 mg by mouth every morning. 09/27/20   [provider]  Biotin 2500 MCG CAPS Take by mouth daily in the afternoon.    [provider]  dexlansoprazole (DEXILANT) 60 MG capsule Take 60 mg by mouth daily.    [provider]  EPINEPHrine 0.3 mg/0.3 mL IJ SOAJ injection Inject into the muscle. 01/23/21   [provider]  escitalopram (LEXAPRO) 20 MG tablet Take 1 tablet (20 mg total) by mouth daily with breakfast. 07/09/18   Rodolph Bong, MD  estradiol (ESTRACE) 1 MG tablet     [provider]  fentaNYL 37.5 MCG/HR PT72 Apply 1 patch topically every 3 (three) days. 10/21/22   [provider]  FIBER PO Take 5 mg by mouth  daily.    [provider]  fluconazole (DIFLUCAN) 150 MG tablet TAKE 1 TABLET (150 MG TOTAL) BY MOUTH EVERY 3 (THREE) DAYS 11/14/20   Hilts, Casimiro Needle, MD  fluticasone (FLONASE) 50 MCG/ACT nasal spray INSTILL 1 SPRAY IN EACH NOSTRIL ONCE DAILY AS NEEDED FOR ALLERGIES Patient taking differently: Place 1 spray into both nostrils daily as needed for allergies. 12/27/18   Rodolph Bong, MD  fluticasone (FLOVENT HFA) 110 MCG/ACT inhaler Inhale 2 puffs into the lungs in the morning and at bedtime. 10/11/20   Hilts, Casimiro Needle, MD  HYDROmorphone (DILAUDID) 2 MG tablet Take 1 tablet (2 mg total) by mouth every 6 (six) hours. 06/20/22     lidocaine (XYLOCAINE) 2 % jelly Apply 1 application topically as needed. 05/16/19   Allie Bossier, MD  medroxyPROGESTERone (PROVERA) 2.5 MG tablet Take 1 tablet (2.5 mg total) by mouth daily. 05/16/19   Allie Bossier, MD  Melatonin 10 MG CAPS Take by mouth at bedtime.    [provider]  montelukast (SINGULAIR) 10 MG tablet Take 10 mg by mouth at bedtime.    [provider]  MOVANTIK 25 MG TABS tablet Take 25 mg by mouth at bedtime.  03/17/17   [provider]  Multiple Vitamins-Minerals (MULTIVITAMIN GUMMIES WOMENS PO) Take by mouth daily in the afternoon.    [provider]  naloxone [facility]) nasal spray 4  mg/0.1 mL SMARTSIG:Both Nares 11/23/20   [provider]  pantoprazole (PROTONIX) 40 MG tablet Take 1 tablet (40 mg total) by mouth daily. Office visit for further refills 12/24/20   Hilarie Fredrickson, MD  rizatriptan (MAXALT) 10 MG tablet Take 1 tablet by mouth as needed. 12/26/20   [provider]  Sennosides (SENOKOT PO) Take 2 tablets by mouth at bedtime. 2 tablets at night     [provider]  temazepam (RESTORIL) 30 MG capsule Take 30 mg by mouth at bedtime as needed. 02/11/20   [provider]  tiZANidine (ZANAFLEX) 2 MG tablet Take 2 mg by mouth 3 (three) times daily as needed. 08/16/18   [provider]  traZODone (DESYREL) 100 MG tablet Take 300 mg by mouth at bedtime. 05/09/19   [provider]  triamcinolone cream (KENALOG) 0.1 % APPLY 1 APPLICATION ON THE SKIN AS DIRECTED APPLY 1-2X PER DAY TO ELBOWS 03/12/21   [provider]  valACYclovir (VALTREX) 1000 MG tablet TAKE 1 TABLET BY MOUTH EVERY DAY Patient taking differently: Take 1,000 mg by mouth daily. 05/16/19   Allie Bossier, MD      Allergies    Carbamazepine, Gabapentin, Pineapple, Shellfish-derived products, Erythromycin, Latex, Pregabalin, Duloxetine, Other, and Pollen extract    Review of Systems   Review of Systems  All other systems reviewed and are negative.   Physical Exam Updated Vital Signs BP (!) 145/81   Pulse 85   Temp 99.2 F (37.3 C) (Oral)   Resp 18   LMP 08/22/2016 (Approximate)   SpO2 93%  Physical Exam Vitals and nursing note reviewed.  Constitutional:      General: She is not in acute distress.    Appearance: Normal appearance. She is well-developed.  HENT:     Head: Normocephalic and atraumatic.  Eyes:     Conjunctiva/sclera: Conjunctivae normal.     Pupils: Pupils are equal, round, and reactive to light.  Cardiovascular:     Rate and Rhythm: Normal rate and regular rhythm.     Heart sounds: Normal heart sounds.  Pulmonary:     Effort: Pulmonary effort is normal. No respiratory distress.     Breath sounds: Normal breath sounds.  Chest:     Chest wall: Tenderness present.     Comments: Significant tenderness with palpation of the left anterior lateral chest wall.  No bony step-off.  No crepitus. Abdominal:     General: There is no distension.     Palpations: Abdomen is soft.     Tenderness: There is no abdominal tenderness.  Musculoskeletal:        General: No deformity. Normal range of motion.     Cervical back: Normal range of motion and neck supple.  Skin:    General: Skin is warm and dry.  Neurological:     General: No focal deficit present.     Mental  Status: She is alert and oriented to person, place, and time.     ED Results / Procedures / Treatments   Labs (all labs ordered are listed, but only abnormal results are displayed) Labs Reviewed  I-STAT CHEM 8, ED - Abnormal; Notable for the following components:      Result Value   Potassium 3.4 (*)    Calcium, Ion 1.11 (*)    All other components within normal limits  CBC WITH DIFFERENTIAL/PLATELET  COMPREHENSIVE METABOLIC PANEL  TROPONIN I (HIGH SENSITIVITY)    EKG None  Radiology DG Chest 2  View  Result Date: 11/14/2022 CLINICAL DATA:  Pneumonia. EXAM: CHEST - 2 VIEW COMPARISON:  X-ray 11/10/2022 FINDINGS: The patchy right midlung opacities have improved. No new consolidation, pneumothorax or effusion. No edema. Normal cardiopericardial silhouette. Degenerative changes along the spine. IMPRESSION: Improving right midlung opacities. Electronically Signed   By: Karen Kays M.D.   On: 11/14/2022 14:12    Procedures Procedures    Medications Ordered in ED Medications  morphine (PF) 4 MG/ML injection 4 mg (4 mg Intravenous Given 11/14/22 1421)    ED Course/ Medical Decision Making/ A&P                             Medical Decision Making Amount and/or Complexity of Data Reviewed Labs: ordered. Radiology: ordered.  Risk Prescription drug management.    Medical Screen Complete  This patient presented to the ED with complaint of left-sided chest pain, shortness of breath.  This complaint involves an extensive number of treatment options. The initial differential diagnosis includes, but is not limited to, pain related to recent fall, ACS, PE, pneumonia, metabolic abnormality  This presentation is: Acute, Chronic, Self-Limited, Previously Undiagnosed, Uncertain Prognosis, Complicated, Systemic Symptoms, and Threat to Life/Bodily Function  Patient is presenting with complaint of persistent left-sided chest discomfort with associated cough and shortness of  breath.  Patient is completing treatment in the outpatient setting for presumed pneumonia.  Notably infiltrates were seen previously in the right midlung.  Chest x-ray obtained today demonstrates improving right sided infiltrates.  However, patient is complaining of persistent left-sided chest pain.  Patient is afebrile.  White count is 5.  Potassium noted to be decreased at 3.3.  P.o. potassium ordered.  Troponin is 2.  Patient with persistent discomfort for most of the week.  No need for additional troponin test.  CTA chest ordered.  Results are pending.  Suspect that if CTA is negative for acute pathology patient should be appropriate for discharge.  Oncoming ED provider Dr. Rush Landmark is aware of case and need for disposition    Additional history obtained:  External records from outside sources obtained and reviewed including prior ED visits and prior Inpatient records.    Lab Tests:  I ordered and personally interpreted labs.  The pertinent results include: CBC, CMP, troponin, i-STAT Chem-8   Imaging Studies ordered:  I ordered imaging studies including chest x-ray, CT angio chest I independently visualized and interpreted obtained imaging which showed resolving infiltrates on right  I agree with the radiologist interpretation.   Cardiac Monitoring:  The patient was maintained on a cardiac monitor.  I personally viewed and interpreted the cardiac monitor which showed an underlying rhythm of: NSR    Medicines ordered:  I ordered medication including morphine, potassium for him, hypokalemia Reevaluation of the patient after these medicines showed that the patient: improved    Problem List / ED Course:  Left-sided chest pain   Reevaluation:  After the interventions noted above, I reevaluated the patient and found that they have: improved  Disposition:  After consideration of the diagnostic results and the patients response to treatment, I feel that the  patent would benefit from completion of ED evaluation.          Final Clinical Impression(s) / ED Diagnoses Final diagnoses:  Nonspecific chest pain    Rx / DC Orders ED Discharge Orders     None         Wynetta Fines, MD 11/14/22  1629  

## 2022-11-14 NOTE — Progress Notes (Signed)
Left shoulder x-ray shows some mild arthritis

## 2022-11-14 NOTE — ED Triage Notes (Signed)
PT arrives via POV from home. Pt reports she fell this past Saturday. She was seen and had an xray of her left shoulder and chest. She was diagnosed with pneumonia and started on abx this past Monday. Pt denies improvement.

## 2022-11-17 NOTE — Progress Notes (Deleted)
Rubin Payor, PhD, LAT, ATC acting as a scribe for Clementeen Graham, MD.  [name] is a 57 y.o. female who presents to [facility] at [facility] today for 1-wk f/u L shoulder, neck, and rib pain after suffering a fall on May 4th. Pt was last seen by Dr. Denyse Amass on 11/10/22 and was advised to f/u w/ her PCP or ED if worsening. Based on chest XR findings, pt was contacted and advised to proceed to ED. She was seen at the [facility] ED on 5/10 and additional dx testing was performed.  Today, pt reports ***  Dx imaging: 11/14/22 Chest CT & chest XR and labs  11/10/22 C-spine, L-sided ribs/chest, & L shoulder XR  Pertinent review of systems: ***  Relevant historical information: ***   Exam:  LMP 08/22/2016 (Approximate)  General: Well Developed, well nourished, and in no acute distress.   MSK: ***    Lab and Radiology Results Results for orders placed or performed during the hospital encounter of 11/14/22 (from the past 72 hour(s))  CBC with Differential     Status: None   Collection Time: 11/14/22  2:23 PM  Result Value Ref Range   WBC 5.0 4.0 - 10.5 K/uL   RBC 4.79 3.87 - 5.11 MIL/uL   Hemoglobin 13.9 12.0 - 15.0 g/dL   HCT 96.0 45.4 - 09.8 %   MCV 90.8 80.0 - 100.0 fL   MCH 29.0 26.0 - 34.0 pg   MCHC 32.0 30.0 - 36.0 g/dL   RDW 11.9 14.7 - 82.9 %   Platelets 242 150 - 400 K/uL   nRBC 0.0 0.0 - 0.2 %   Neutrophils Relative % 55 %   Neutro Abs 2.8 1.7 - 7.7 K/uL   Lymphocytes Relative 28 %   Lymphs Abs 1.4 0.7 - 4.0 K/uL   Monocytes Relative 11 %   Monocytes Absolute 0.6 0.1 - 1.0 K/uL   Eosinophils Relative 4 %   Eosinophils Absolute 0.2 0.0 - 0.5 K/uL   Basophils Relative 1 %   Basophils Absolute 0.1 0.0 - 0.1 K/uL   WBC Morphology MORPHOLOGY UNREMARKABLE    RBC Morphology MORPHOLOGY UNREMARKABLE    Smear Review MORPHOLOGY UNREMARKABLE    Immature Granulocytes 1 %   Abs Immature Granulocytes 0.07 0.00 - 0.07 K/uL    Comment: Performed at [facility], [address], [place], [place] [address]  Comprehensive metabolic panel     Status: Abnormal   Collection Time: 11/14/22  2:23 PM  Result Value Ref Range   Sodium 136 135 - 145 mmol/L   Potassium 3.3 (L) 3.5 - 5.1 mmol/L   Chloride 100 98 - 111 mmol/L   CO2 27 22 - 32 mmol/L   Glucose, Bld 98 70 - 99 mg/dL    Comment: Glucose reference range applies only to samples taken after fasting for at least 8 hours.   BUN 6 6 - 20 mg/dL   Creatinine, Ser 0.86 0.44 - 1.00 mg/dL   Calcium 8.6 (L) 8.9 - 10.3 mg/dL   Total Protein 6.8 6.5 - 8.1 g/dL   Albumin 3.5 3.5 - 5.0 g/dL   AST 24 15 - 41 U/L   ALT 17 0 - 44 U/L   Alkaline Phosphatase 71 38 - 126 U/L   Total Bilirubin 0.6 0.3 - 1.2 mg/dL   GFR, Estimated >57 >84 mL/min    Comment: (NOTE) Calculated using the CKD-EPI Creatinine Equation (2021)  Anion gap 9 5 - 15    Comment: Performed at [facility], [address], [place], [place] [address]  Troponin I (High Sensitivity)     Status: None   Collection Time: 11/14/22  2:23 PM  Result Value Ref Range   Troponin I (High Sensitivity) 2 <18 ng/L    Comment: (NOTE) Elevated high sensitivity troponin I (hsTnI) values and significant  changes across serial measurements may suggest ACS but many other  chronic and acute conditions are known to elevate hsTnI results.  Refer to the "Links" section for chest pain algorithms and additional  guidance. Performed at [facility], [address], [place], [place] [address]   I-stat chem 8, ED     Status: Abnormal   Collection Time: 11/14/22  3:09 PM  Result Value Ref Range   Sodium 139 135 - 145 mmol/L   Potassium 3.4 (L) 3.5 - 5.1 mmol/L   Chloride 101 98 - 111 mmol/L   BUN 7 6 - 20 mg/dL   Creatinine, Ser 8.65 0.44 - 1.00 mg/dL   Glucose, Bld 96 70 - 99 mg/dL    Comment: Glucose reference range applies only to samples taken after fasting for at least 8 hours.   Calcium, Ion 1.11 (L) 1.15 - 1.40 mmol/L   TCO2 27  22 - 32 mmol/L   Hemoglobin 13.6 12.0 - 15.0 g/dL   HCT 78.4 69.6 - 29.5 %   CT Angio Chest PE W and/or Wo Contrast  Result Date: 11/14/2022 CLINICAL DATA:  Following week ago with left-sided chest pain. EXAM: CT ANGIOGRAPHY CHEST WITH CONTRAST TECHNIQUE: Multidetector CT imaging of the chest was performed using the standard protocol during bolus administration of intravenous contrast. Multiplanar CT image reconstructions and MIPs were obtained to evaluate the vascular anatomy. RADIATION DOSE REDUCTION: This exam was performed according to the departmental dose-optimization program which includes automated exposure control, adjustment of the mA and/or kV according to patient size and/or use of iterative reconstruction technique. CONTRAST:  75mL OMNIPAQUE IOHEXOL 350 MG/ML SOLN COMPARISON:  Chest x-ray earlier 11/14/2022.  Older x-rays as well FINDINGS: Cardiovascular: Mild breathing motion. This limits evaluation of small and peripheral emboli. No segmental or larger pulmonary embolism identified. Heart is nonenlarged. No significant pericardial effusion. The thoracic aorta has a normal course and caliber. Mediastinum/Nodes: Small hiatal hernia. Gastric sleeve changes. Patulous esophagus. Small thyroid gland. No specific abnormal lymph node enlargement identified in the axillary region there are several enlarged hilar nodes. Example on the right on series 5, image 54 measures 2.3 by 1.9 cm. More caudal in the right hilum on series 5, image 68 a lymph node measures 2.4 x 1.5 cm. Small nodes in the left hilum. Small mediastinal nodes are seen, more prominent than usually seen. Lungs/Pleura: There are areas of ground-glass and patchy confluence opacities identified greatest in the right upper lobe and lower lobe with more mild areas along the middle lobe and left lung. Associated areas of bronchial wall thickening particularly in the right lung. Some areas of bronchial debris. Please correlate for an infectious  or inflammatory process. The areas are more pronounced on CT scan than the prior x-ray. Upper Abdomen: No acute abnormality. Musculoskeletal: Mild degenerative changes seen along the spine. Review of the MIP images confirms the above findings. IMPRESSION: Breathing motion.  No segmental or larger pulmonary embolism. Bilateral ill-defined parenchymal opacities identified in a patchy distribution with the associated bronchial wall thickening. Right lung greater than left. An infiltrative  process is possible. In addition there are some enlarged hilar nodes, right-greater-than-left. These could be reactive. Overall recommend follow-up to confirm resolution. Electronically Signed   By: Karen Kays M.D.   On: 11/14/2022 17:05   DG Chest 2 View  Result Date: 11/14/2022 CLINICAL DATA:  Pneumonia. EXAM: CHEST - 2 VIEW COMPARISON:  X-ray 11/10/2022 FINDINGS: The patchy right midlung opacities have improved. No new consolidation, pneumothorax or effusion. No edema. Normal cardiopericardial silhouette. Degenerative changes along the spine. IMPRESSION: Improving right midlung opacities. Electronically Signed   By: Karen Kays M.D.   On: 11/14/2022 14:12       Assessment and Plan: 57 y.o. female with ***   PDMP not reviewed this encounter. No orders of the defined types were placed in this encounter.  No orders of the defined types were placed in this encounter.    Discussed warning signs or symptoms. Please see discharge instructions. Patient expresses understanding.   ***

## 2022-11-18 ENCOUNTER — Ambulatory Visit: Payer: Medicare HMO | Admitting: Family Medicine

## 2022-11-18 DIAGNOSIS — F3130 Bipolar disorder, current episode depressed, mild or moderate severity, unspecified: Secondary | ICD-10-CM | POA: Diagnosis not present

## 2022-11-18 DIAGNOSIS — R590 Localized enlarged lymph nodes: Secondary | ICD-10-CM | POA: Diagnosis not present

## 2022-11-18 DIAGNOSIS — J4541 Moderate persistent asthma with (acute) exacerbation: Secondary | ICD-10-CM | POA: Diagnosis not present

## 2022-11-18 DIAGNOSIS — R1311 Dysphagia, oral phase: Secondary | ICD-10-CM | POA: Diagnosis not present

## 2022-11-18 DIAGNOSIS — F900 Attention-deficit hyperactivity disorder, predominantly inattentive type: Secondary | ICD-10-CM | POA: Diagnosis not present

## 2022-11-18 DIAGNOSIS — R918 Other nonspecific abnormal finding of lung field: Secondary | ICD-10-CM | POA: Diagnosis not present

## 2022-11-18 DIAGNOSIS — J189 Pneumonia, unspecified organism: Secondary | ICD-10-CM | POA: Diagnosis not present

## 2022-11-24 DIAGNOSIS — M62830 Muscle spasm of back: Secondary | ICD-10-CM | POA: Diagnosis not present

## 2022-11-24 DIAGNOSIS — M1711 Unilateral primary osteoarthritis, right knee: Secondary | ICD-10-CM | POA: Diagnosis not present

## 2022-11-24 DIAGNOSIS — G894 Chronic pain syndrome: Secondary | ICD-10-CM | POA: Diagnosis not present

## 2022-11-24 DIAGNOSIS — M47816 Spondylosis without myelopathy or radiculopathy, lumbar region: Secondary | ICD-10-CM | POA: Diagnosis not present

## 2022-11-27 DIAGNOSIS — R058 Other specified cough: Secondary | ICD-10-CM | POA: Diagnosis not present

## 2022-11-29 DIAGNOSIS — Z008 Encounter for other general examination: Secondary | ICD-10-CM | POA: Diagnosis not present

## 2022-12-02 DIAGNOSIS — F3130 Bipolar disorder, current episode depressed, mild or moderate severity, unspecified: Secondary | ICD-10-CM | POA: Diagnosis not present

## 2022-12-02 DIAGNOSIS — F900 Attention-deficit hyperactivity disorder, predominantly inattentive type: Secondary | ICD-10-CM | POA: Diagnosis not present

## 2022-12-04 DIAGNOSIS — F3175 Bipolar disorder, in partial remission, most recent episode depressed: Secondary | ICD-10-CM | POA: Diagnosis not present

## 2022-12-04 DIAGNOSIS — F900 Attention-deficit hyperactivity disorder, predominantly inattentive type: Secondary | ICD-10-CM | POA: Diagnosis not present

## 2022-12-04 DIAGNOSIS — G43009 Migraine without aura, not intractable, without status migrainosus: Secondary | ICD-10-CM | POA: Diagnosis not present

## 2022-12-08 ENCOUNTER — Encounter: Payer: Self-pay | Admitting: Hematology and Oncology

## 2022-12-11 DIAGNOSIS — R69 Illness, unspecified: Secondary | ICD-10-CM | POA: Diagnosis not present

## 2022-12-18 DIAGNOSIS — G894 Chronic pain syndrome: Secondary | ICD-10-CM | POA: Diagnosis not present

## 2022-12-18 DIAGNOSIS — F900 Attention-deficit hyperactivity disorder, predominantly inattentive type: Secondary | ICD-10-CM | POA: Diagnosis not present

## 2022-12-18 DIAGNOSIS — F3175 Bipolar disorder, in partial remission, most recent episode depressed: Secondary | ICD-10-CM | POA: Diagnosis not present

## 2022-12-18 DIAGNOSIS — M62830 Muscle spasm of back: Secondary | ICD-10-CM | POA: Diagnosis not present

## 2022-12-18 DIAGNOSIS — M47816 Spondylosis without myelopathy or radiculopathy, lumbar region: Secondary | ICD-10-CM | POA: Diagnosis not present

## 2022-12-18 DIAGNOSIS — M1711 Unilateral primary osteoarthritis, right knee: Secondary | ICD-10-CM | POA: Diagnosis not present

## 2022-12-31 DIAGNOSIS — J309 Allergic rhinitis, unspecified: Secondary | ICD-10-CM | POA: Diagnosis not present

## 2022-12-31 DIAGNOSIS — J339 Nasal polyp, unspecified: Secondary | ICD-10-CM | POA: Diagnosis not present

## 2023-01-01 DIAGNOSIS — F900 Attention-deficit hyperactivity disorder, predominantly inattentive type: Secondary | ICD-10-CM | POA: Diagnosis not present

## 2023-01-01 DIAGNOSIS — F3175 Bipolar disorder, in partial remission, most recent episode depressed: Secondary | ICD-10-CM | POA: Diagnosis not present

## 2023-01-13 DIAGNOSIS — Z9289 Personal history of other medical treatment: Secondary | ICD-10-CM | POA: Insufficient documentation

## 2023-01-13 DIAGNOSIS — J338 Other polyp of sinus: Secondary | ICD-10-CM | POA: Diagnosis not present

## 2023-01-13 DIAGNOSIS — J3489 Other specified disorders of nose and nasal sinuses: Secondary | ICD-10-CM | POA: Diagnosis not present

## 2023-01-13 DIAGNOSIS — R438 Other disturbances of smell and taste: Secondary | ICD-10-CM | POA: Diagnosis not present

## 2023-01-13 DIAGNOSIS — J339 Nasal polyp, unspecified: Secondary | ICD-10-CM | POA: Diagnosis not present

## 2023-01-13 DIAGNOSIS — J309 Allergic rhinitis, unspecified: Secondary | ICD-10-CM | POA: Diagnosis not present

## 2023-01-16 DIAGNOSIS — M47816 Spondylosis without myelopathy or radiculopathy, lumbar region: Secondary | ICD-10-CM | POA: Diagnosis not present

## 2023-01-16 DIAGNOSIS — G894 Chronic pain syndrome: Secondary | ICD-10-CM | POA: Diagnosis not present

## 2023-01-16 DIAGNOSIS — M62830 Muscle spasm of back: Secondary | ICD-10-CM | POA: Diagnosis not present

## 2023-01-16 DIAGNOSIS — M1711 Unilateral primary osteoarthritis, right knee: Secondary | ICD-10-CM | POA: Diagnosis not present

## 2023-01-19 DIAGNOSIS — F3175 Bipolar disorder, in partial remission, most recent episode depressed: Secondary | ICD-10-CM | POA: Diagnosis not present

## 2023-01-19 DIAGNOSIS — F900 Attention-deficit hyperactivity disorder, predominantly inattentive type: Secondary | ICD-10-CM | POA: Diagnosis not present

## 2023-01-23 ENCOUNTER — Ambulatory Visit: Payer: Medicare HMO | Admitting: Neurology

## 2023-01-23 ENCOUNTER — Encounter: Payer: Self-pay | Admitting: Neurology

## 2023-01-23 VITALS — BP 110/74 | HR 81 | Ht 66.0 in | Wt 187.5 lb

## 2023-01-23 DIAGNOSIS — G43711 Chronic migraine without aura, intractable, with status migrainosus: Secondary | ICD-10-CM

## 2023-01-23 MED ORDER — AJOVY 225 MG/1.5ML ~~LOC~~ SOAJ
225.0000 mg | SUBCUTANEOUS | 11 refills | Status: DC
Start: 2023-01-23 — End: 2023-08-12

## 2023-01-23 MED ORDER — FREMANEZUMAB-VFRM 225 MG/1.5ML ~~LOC~~ SOSY
225.0000 mg | PREFILLED_SYRINGE | Freq: Once | SUBCUTANEOUS | Status: DC
Start: 2023-01-23 — End: 2023-08-12

## 2023-01-23 MED ORDER — AJOVY 225 MG/1.5ML ~~LOC~~ SOAJ
225.0000 mg | SUBCUTANEOUS | Status: AC
Start: 2023-01-23 — End: ?

## 2023-01-23 NOTE — Patient Instructions (Signed)
Try Ajovy monthly for prevention. Insurance may make [place] chane to [employer]. Acute: if this works, try [place] or [employer] is this medication? FREMANEZUMAB (fre ma NEZ ue mab) prevents migraines. It works by blocking a substance in the body that causes migraines. It is a monoclonal antibody. This medicine may be used for other purposes; ask your health care provider or pharmacist if you have questions. COMMON BRAND NAME(S): AJOVY What should I tell my care team before I take this medication? They need to know if you have any of these conditions: An unusual or allergic reaction to fremanezumab, other medications, foods, dyes, or preservatives Pregnant or trying to get pregnant Breast-feeding How should I use this medication? This medication is injected under the skin. You will be taught how to prepare and give it. Take it as directed on the prescription label. Keep taking it unless your care team tells you to stop. It is important that you put your used needles and syringes in a special sharps container. Do not put them in a trash can. If you do not have a sharps container, call your pharmacist or care team to get one. Talk to your care team about the use of this medication in children. Special care may be needed. Overdosage: If you think you have taken too much of this medicine contact a poison control center or emergency room at once. NOTE: This medicine is only for you. Do not share this medicine with others. What if I miss a dose? If you miss a dose, take it as soon as you can. If it is almost time for your next dose, take only that dose. Do not take double or extra doses. What may interact with this medication? Interactions are not expected. This list may not describe all possible interactions. Give your health care provider a list of all the medicines, herbs, non-prescription drugs, or dietary supplements you use. Also tell them if you smoke, drink alcohol, or use  illegal drugs. Some items may interact with your medicine. What should I watch for while using this medication? Tell your care team if your symptoms do not start to get better or if they get worse. What side effects may I notice from receiving this medication? Side effects that you should report to your care team as soon as possible: Allergic reactions or angioedema--skin rash, itching or hives, swelling of the face, eyes, lips, tongue, arms, or legs, trouble swallowing or breathing Side effects that usually do not require medical attention (report to your care team if they continue or are bothersome): Pain, redness, or irritation at injection site This list may not describe all possible side effects. Call your doctor for medical advice about side effects. You may report side effects to FDA at 1-800-FDA-1088. Where should I keep my medication? Keep out of the reach of children and pets. Store in a refrigerator or at room temperature between 20 and 25 degrees C (68 and 77 degrees F). Refrigeration (preferred): Store in the refrigerator. Do not freeze. Keep in the original container until you are ready to take it. Remove the dose from the carton about 30 minutes before it is time for you to use it. If the dose is not used, it may be stored in the original container at room temperature for 7 days. Get rid of any unused medication after the expiration date. Room Temperature: This medication may be stored at room temperature for up to 7 days. Keep it in the original  container. Protect from light until time of use. If it is stored at room temperature, get rid of any unused medication after 7 days or after it expires, whichever is first. To get rid of medications that are no longer needed or have expired: Take the medication to a medication take-back program. Check with your pharmacy or law enforcement to find a location. If you cannot return the medication, ask your pharmacist or care team how to get rid  of this medication safely. NOTE: This sheet is a summary. It may not cover all possible information. If you have questions about this medicine, talk to your doctor, pharmacist, or health care provider.  2024 Elsevier/Gold Standard (2021-08-16 00:00:00)

## 2023-01-23 NOTE — Progress Notes (Unsigned)
[facility]    Provider:  Dr Lucia Gaskins Requesting Provider: Ellis Savage, NP Primary Care Provider:  Andreas Blower., MD  CC:  chronic migraines  HPI:  [name] is a 57 y.o. female here as requested by Ellis Savage, NP for chronic migraines. 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); Disturbance in sleep behavior; Dysphagia; Esophageal reflux; Perimenopausal; Obstructive sleep apnea (adult) (pediatric); BMI 33.0-33.9,adult; Right knee pain; Dyslipidemia; Arthritis of right knee; Family history of DVT; Onychomycosis of great toe; Leukocytosis; Positive colorectal cancer screening using Cologuard test; Iron deficiency anemia secondary to inadequate dietary iron intake; Constipation; Hyperlipidemia LDL goal <130; Lymphadenopathy, anterior cervical; Sebaceous cyst; and Chronic migraine without aura, with intractable migraine, so stated, with status migrainosus on their problem list.   Here with mother who provides much information. Migraines started years ago more than 7 years but started getting worse 3 years ago. Sharp pains in the back of her head. She has seen her pain doctor for shots in the back of the head and treatment of neck and that didn't help. Cab be unilateral, ends up behind her eye, she saw an eye doctor and all was fine. Dad had migraines. They are Pulsating, pounding.throbbing, phonophobia, dizziness, nasuea no vomiting. She has daily headaches and > 10 moderate to severe migraine gays a month, she thought it was sinus and saw ENT and had prednisone and polyps removed and didn't help. Can last 24 hours untreated. She can wake up with a headache. Excedrin every once in a while, no medication overuse. No aura. She takes dilaudid, fentanyl for other chronic pain. Father had a severe manic episode. She is here with her mother and her emotional dog. No botox. Stress, weather is a trigger.  Nothing has helped. Barometric pressure can trigger. Associated dizziness. No other focal neurologic deficits, associated symptoms, inciting events or modifiable factors.  Reviewed notes, labs and imaging from outside physicians, which showed:  From a thorough review of records, Meds tried > 3 months or had side effects: Topamax, rizatriptan, tylenol, baclofen, bisoprolol(BP b-blocker like propranolol),abilify, tegretol,flexeril, lexapro, lisinopril, mobic, robaxin, metoprolol, nortriptyline, zofran, prednisone, seroquel, maxalt,imitrex, tizanidine, trazodone, aimovig contraindicated due to constipation    10/17/2020: mri brain  CLINICAL DATA:  Increasing headaches.  Prior trauma.   EXAM: MRI HEAD WITHOUT CONTRAST   TECHNIQUE: Multiplanar, multiecho pulse sequences of the brain and surrounding structures were obtained without intravenous contrast.   COMPARISON:  None.   FINDINGS: Brain: No acute infarction, hemorrhage, hydrocephalus, extra-axial collection or mass lesion. There are a few small T2/FLAIR hyperintensities within the white matter.   Vascular: Major arterial flow voids are maintained at the skull base.   Skull and upper cervical spine: Normal marrow signal.   Sinuses/Orbits: Mucosal thickening of the left frontal sinus, ethmoid air cells, and left greater than right maxillary sinuses with left maxillary air-fluid level.   Other: No sizable mastoid effusion.   IMPRESSION: 1. No evidence of acute intracranial abnormality. 2. There are a few T2/FLAIR hyperintensities within the white matter, which are nonspecific but most likely related to age-appropriate chronic microvascular disease. 3. Paranasal sinus mucosal thickening with left maxillary sinus air-fluid level. Recommend correlation with the presence or absence of signs/symptoms of sinusitis.     Electronically Signed   By: Feliberto Harts MD   On: 10/17/2020 21:09  Recent Results (from the past 2160  hour(s))  Basic Metabolic Panel (BMET)     Status: None  Collection Time: 11/10/22  9:30 AM  Result Value Ref Range   Sodium 135 135 - 145 mEq/L   Potassium 4.2 3.5 - 5.1 mEq/L   Chloride 96 96 - 112 mEq/L   CO2 30 19 - 32 mEq/L   Glucose, Bld 90 70 - 99 mg/dL   BUN 8 6 - 23 mg/dL   Creatinine, Ser 0.98 0.40 - 1.20 mg/dL   GFR 11.91 >47.82 mL/min    Comment: Calculated using the CKD-EPI Creatinine Equation (2021)   Calcium 8.7 8.4 - 10.5 mg/dL  CBC with Differential/Platelet     Status: Abnormal   Collection Time: 11/10/22  9:30 AM  Result Value Ref Range   WBC 10.0 4.0 - 10.5 K/uL   RBC 4.48 3.87 - 5.11 Mil/uL   Hemoglobin 13.7 12.0 - 15.0 g/dL   HCT 95.6 21.3 - 08.6 %   MCV 90.1 78.0 - 100.0 fl   MCHC 33.8 30.0 - 36.0 g/dL   RDW 57.8 46.9 - 62.9 %   Platelets 234.0 150.0 - 400.0 K/uL   Neutrophils Relative % 83.4 (H) 43.0 - 77.0 %   Lymphocytes Relative 4.9 Repeated and verified X2. (L) 12.0 - 46.0 %   Monocytes Relative 10.6 3.0 - 12.0 %   Eosinophils Relative 0.5 0.0 - 5.0 %   Basophils Relative 0.6 0.0 - 3.0 %   Neutro Abs 8.3 (H) 1.4 - 7.7 K/uL   Lymphs Abs 0.5 (L) 0.7 - 4.0 K/uL   Monocytes Absolute 1.1 (H) 0.1 - 1.0 K/uL   Eosinophils Absolute 0.0 0.0 - 0.7 K/uL   Basophils Absolute 0.1 0.0 - 0.1 K/uL  CBC with Differential     Status: None   Collection Time: 11/14/22  2:23 PM  Result Value Ref Range   WBC 5.0 4.0 - 10.5 K/uL   RBC 4.79 3.87 - 5.11 MIL/uL   Hemoglobin 13.9 12.0 - 15.0 g/dL   HCT 52.8 41.3 - 24.4 %   MCV 90.8 80.0 - 100.0 fL   MCH 29.0 26.0 - 34.0 pg   MCHC 32.0 30.0 - 36.0 g/dL   RDW 01.0 27.2 - 53.6 %   Platelets 242 150 - 400 K/uL   nRBC 0.0 0.0 - 0.2 %   Neutrophils Relative % 55 %   Neutro Abs 2.8 1.7 - 7.7 K/uL   Lymphocytes Relative 28 %   Lymphs Abs 1.4 0.7 - 4.0 K/uL   Monocytes Relative 11 %   Monocytes Absolute 0.6 0.1 - 1.0 K/uL   Eosinophils Relative 4 %   Eosinophils Absolute 0.2 0.0 - 0.5 K/uL   Basophils Relative 1 %    Basophils Absolute 0.1 0.0 - 0.1 K/uL   WBC Morphology MORPHOLOGY UNREMARKABLE    RBC Morphology MORPHOLOGY UNREMARKABLE    Smear Review MORPHOLOGY UNREMARKABLE    Immature Granulocytes 1 %   Abs Immature Granulocytes 0.07 0.00 - 0.07 K/uL    Comment: Performed at [facility], [address] [place], [place] [address]  Comprehensive metabolic panel     Status: Abnormal   Collection Time: 11/14/22  2:23 PM  Result Value Ref Range   Sodium 136 135 - 145 mmol/L   Potassium 3.3 (L) 3.5 - 5.1 mmol/L   Chloride 100 98 - 111 mmol/L   CO2 27 22 - 32 mmol/L   Glucose, Bld 98 70 - 99 mg/dL    Comment: Glucose reference range applies only to samples taken after fasting for at least 8 hours.  BUN 6 6 - 20 mg/dL   Creatinine, Ser 1.61 0.44 - 1.00 mg/dL   Calcium 8.6 (L) 8.9 - 10.3 mg/dL   Total Protein 6.8 6.5 - 8.1 g/dL   Albumin 3.5 3.5 - 5.0 g/dL   AST 24 15 - 41 U/L   ALT 17 0 - 44 U/L   Alkaline Phosphatase 71 38 - 126 U/L   Total Bilirubin 0.6 0.3 - 1.2 mg/dL   GFR, Estimated >09 >60 mL/min    Comment: (NOTE) Calculated using the CKD-EPI Creatinine Equation (2021)    Anion gap 9 5 - 15    Comment: Performed at [facility], [address], [place], [place] [address]  Troponin I (High Sensitivity)     Status: None   Collection Time: 11/14/22  2:23 PM  Result Value Ref Range   Troponin I (High Sensitivity) 2 <18 ng/L    Comment: (NOTE) Elevated high sensitivity troponin I (hsTnI) values and significant  changes across serial measurements may suggest ACS but many other  chronic and acute conditions are known to elevate hsTnI results.  Refer to the "Links" section for chest pain algorithms and additional  guidance. Performed at [facility], [address], [place], [place] [address]   I-stat chem 8, ED     Status: Abnormal   Collection Time: 11/14/22  3:09 PM  Result Value Ref Range   Sodium 139 135 - 145 mmol/L   Potassium 3.4 (L) 3.5 - 5.1 mmol/L    Chloride 101 98 - 111 mmol/L   BUN 7 6 - 20 mg/dL   Creatinine, Ser 4.78 0.44 - 1.00 mg/dL   Glucose, Bld 96 70 - 99 mg/dL    Comment: Glucose reference range applies only to samples taken after fasting for at least 8 hours.   Calcium, Ion 1.11 (L) 1.15 - 1.40 mmol/L   TCO2 27 22 - 32 mmol/L   Hemoglobin 13.6 12.0 - 15.0 g/dL   HCT 29.5 62.1 - 30.8 %    Review of Systems: Patient complains of symptoms per HPI as well as the following symptoms chronic pain. Pertinent negatives and positives per HPI. All others negative.   Social History   Socioeconomic History   Marital status: Married    Spouse name: Not on file   Number of children: 0   Years of education: Not on file   Highest education level: Not on file  Occupational History   Occupation: disabled    Employer: UNEMPLOYED  Tobacco Use   Smoking status: Never   Smokeless tobacco: Never  Vaping Use   Vaping status: Never Used  Substance and Sexual Activity   Alcohol use: No   Drug use: No   Sexual activity: Yes    Birth control/protection: None  Other Topics Concern   Not on file  Social History Narrative   Not on file   Social Determinants of Health   Financial Resource Strain: Not on file  Food Insecurity: Food Insecurity Present (05/26/2017)   Hunger Vital Sign    Worried About Running Out of Food in the Last Year: Sometimes true    Ran Out of Food in the Last Year: Never true  Transportation Needs: Not on file  Physical Activity: Not on file  Stress: Not on file  Social Connections: Not on file  Intimate Partner Violence: Not on file    Family History  Problem Relation Age of Onset   Cancer Father  Leukemia   Diabetes Father    Heart disease Father        first PCI in his 80s   Diabetes Mother    Cancer Maternal Grandmother        Stomach   Colon cancer Maternal Grandmother    Cancer Paternal Grandmother    Cancer Paternal Aunt    Diabetes Paternal Aunt    Cancer Paternal Uncle     Cancer Paternal Aunt    Rectal cancer Neg Hx    Stomach cancer Neg Hx    Esophageal cancer Neg Hx     Past Medical History:  Diagnosis Date   Allergy    Anxiety    Arthritis    Asthma    History of Asthma   Bipolar disorder (HCC)    Chest pain, atypical 11/30/2012   Clotting disorder (HCC)    Depression    Diabetes mellitus without complication (HCC)    type 2, pre-diabetic before she lost weight   GERD (gastroesophageal reflux disease)    H/O degenerative disc disease    L4-L5, L5-S1   Heart murmur    Hyperglycemia    Postoperative hyperglycemia   Hypertension    hx of   Neuromuscular disorder (HCC)    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   Obesity 07/14/2012   OSA (obstructive sleep apnea)    mild   Pneumonia    Polycystic disease, ovaries    Postoperative anemia    2018 - while dieting  hx of   Reflux    Sinus tachycardia 07/14/2012   Pt says HR > 100 is consistent   Sleep apnea    has had two tests, different results- no CPAP used    Patient Active Problem List   Diagnosis Date Noted   Chronic migraine without aura, with intractable migraine, so stated, with status migrainosus 01/25/2023   Lymphadenopathy, anterior cervical 07/13/2020   Sebaceous cyst 07/13/2020   Iron deficiency anemia secondary to inadequate dietary iron intake 01/12/2020   Constipation 12/13/2019   Hyperlipidemia LDL goal <130 09/08/2019   Positive colorectal cancer screening using Cologuard test 03/02/2018   Leukocytosis 01/26/2018   Onychomycosis of great toe 12/22/2017   Family history of DVT 03/20/2017   Arthritis of right knee 05/13/2016   Dyslipidemia 03/04/2016   Right knee pain 03/03/2016   Pre-diabetes 11/01/2015   Allergic rhinitis 05/28/2015   BMI 33.0-33.9,adult 05/24/2013   Polycystic ovary disease 07/14/2012   Obstructive sleep apnea (adult) (pediatric) 02/04/2012   Perimenopausal 11/10/2011   Disturbance in sleep behavior 09/16/2011   Asthma 05/23/2011    Backache 05/23/2011   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) 05/23/2011   Dysphagia 05/23/2011   Esophageal reflux 05/23/2011    Past Surgical History:  Procedure Laterality Date   COLONOSCOPY     FOOT SURGERY     Right plantar facsiitis scrape   KNEE SURGERY Bilateral 2007   LAPAROSCOPIC GASTRIC SLEEVE RESECTION N/A 03/30/2017   Procedure: LAPAROSCOPIC GASTRIC SLEEVE RESECTION, UPPER ENDO;  Surgeon: Gaynelle Adu, MD;  Location: [facility];  Service: General;  Laterality: N/A;   NASAL SINUS SURGERY  2004   OVARIAN CYST REMOVAL  1998   A cyst on the fallopian tube removed   SPINAL FUSION     TONSILLECTOMY  1996    Current Outpatient Medications  Medication Sig Dispense Refill   albuterol (VENTOLIN HFA) 108 (90 Base) MCG/ACT inhaler Inhale 2 puffs into the lungs every 4 (four) hours as  needed for wheezing or shortness of breath.     ALPRAZolam (XANAX XR) 1 MG 24 hr tablet Take 1 mg by mouth 2 (two) times daily.     ALPRAZolam (XANAX) 1 MG tablet Take 1 mg by mouth every morning.     Biotin 2500 MCG CAPS Take by mouth daily in the afternoon.     EPINEPHrine 0.3 mg/0.3 mL IJ SOAJ injection Inject into the muscle.     escitalopram (LEXAPRO) 20 MG tablet Take 1 tablet (20 mg total) by mouth daily with breakfast. 30 tablet 3   estradiol (ESTRACE) 1 MG tablet      fentaNYL 37.5 MCG/HR PT72 Apply 1 patch topically every 3 (three) days.     FIBER PO Take 5 mg by mouth daily.     fluconazole (DIFLUCAN) 150 MG tablet TAKE 1 TABLET (150 MG TOTAL) BY MOUTH EVERY 3 (THREE) DAYS 5 tablet 0   fluticasone (FLOVENT HFA) 110 MCG/ACT inhaler Inhale 2 puffs into the lungs in the morning and at bedtime. 1 each 12   Fremanezumab-vfrm (AJOVY) 225 MG/1.5ML SOAJ Inject 225 mg into the skin every 30 (thirty) days.     Fremanezumab-vfrm (AJOVY) 225 MG/1.5ML SOAJ Inject 225 mg into the skin every 30 (thirty) days. 1.5 mL 11   HYDROmorphone (DILAUDID) 2 MG tablet Take 1 tablet (2 mg total) by mouth every 6  (six) hours. 120 tablet 0   lidocaine (XYLOCAINE) 2 % jelly Apply 1 application topically as needed. 30 mL 2   medroxyPROGESTERone (PROVERA) 2.5 MG tablet Take 1 tablet (2.5 mg total) by mouth daily. 90 tablet 5   Melatonin 10 MG CAPS Take by mouth at bedtime.     montelukast (SINGULAIR) 10 MG tablet Take 10 mg by mouth at bedtime.     MOVANTIK 25 MG TABS tablet Take 25 mg by mouth at bedtime.      Multiple Vitamins-Minerals (MULTIVITAMIN GUMMIES WOMENS PO) Take by mouth daily in the afternoon.     naloxone (NARCAN) nasal spray 4 mg/0.1 mL SMARTSIG:Both Nares     pantoprazole (PROTONIX) 40 MG tablet Take 1 tablet (40 mg total) by mouth daily. Office visit for further refills 90 tablet 0   Sennosides (SENOKOT PO) Take 2 tablets by mouth at bedtime. 2 tablets at night      temazepam (RESTORIL) 30 MG capsule Take 30 mg by mouth at bedtime as needed.     tiZANidine (ZANAFLEX) 2 MG tablet Take 2 mg by mouth 3 (three) times daily as needed.     topiramate (TOPAMAX) 50 MG tablet Take 50 mg by mouth 2 (two) times daily.     traZODone (DESYREL) 100 MG tablet Take 300 mg by mouth at bedtime.     triamcinolone cream (KENALOG) 0.1 % APPLY 1 APPLICATION ON THE SKIN AS DIRECTED APPLY 1-2X PER DAY TO ELBOWS     Current Facility-Administered Medications  Medication Dose Route Frequency Provider Last Rate Last Admin   Fremanezumab-vfrm SOSY 225 mg  225 mg Subcutaneous Once         Allergies as of 01/23/2023 - Review Complete 01/23/2023  Allergen Reaction Noted   Carbamazepine Swelling 07/09/2018   Gabapentin Nausea And Vomiting and Anaphylaxis 04/22/2011   Pineapple Shortness Of Breath and Swelling    Shellfish-derived products Anaphylaxis 12/13/2014   Erythromycin Swelling 04/22/2011   Latex Rash 04/22/2011   Pregabalin Swelling 04/22/2011   Duloxetine Other (See Comments) 05/28/2015   Other  03/19/2018   Pollen extract Other (See Comments)  02/04/2012    Vitals: BP 110/74   Pulse 81   Ht 5\' 6"   (1.676 m)   Wt 187 lb 8 oz (85 kg)   LMP 08/22/2016 (Approximate)   BMI 30.26 kg/m  Last Weight:  Wt Readings from Last 1 Encounters:  01/23/23 187 lb 8 oz (85 kg)   Last Height:   Ht Readings from Last 1 Encounters:  01/23/23 5\' 6"  (1.676 m)     Physical exam: Exam: Gen: NAD, conversant, well nourised, obese, well groomed                     CV: RRR, no MRG. No Carotid Bruits. No peripheral edema, warm, nontender Eyes: Conjunctivae clear without exudates or hemorrhage  Neuro: Detailed Neurologic Exam  Speech:    Speech is normal; fluent and spontaneous with normal comprehension.  Cognition:    The patient is oriented to person, place, and time;     recent and remote memory intact;     language fluent;     normal attention, concentration,     fund of knowledge Cranial Nerves:    The pupils are equal, round, and reactive to light. ONH flat, no edema.  Visual fields are full to finger confrontation. Extraocular movements are intact. Trigeminal sensation is intact and the muscles of mastication are normal. The face is symmetric. The palate elevates in the midline. Hearing intact. Voice is normal. Shoulder shrug is normal. The tongue has normal motion without fasciculations.   Coordination:    Normal finger to nose and heel to shin. Normal rapid alternating movements.   Gait: nml  Motor Observation:    No asymmetry, no atrophy, and no involuntary movements noted. Tone:    Normal muscle tone.    Posture:    Posture is normal. normal erect    Strength:    Strength is V/V in the upper and lower limbs.      Sensation: intact to LT     Reflex Exam:  DTR's:    Deep tendon reflexes in the upper and lower extremities are normal bilaterally.   Toes:    The toes are downgoing bilaterally.   Clonus:    Clonus is absent.    Assessment/Plan:  Patient with chronic migraines > 3 years. MRI did not show any pathology for migraines, no changes in quality/severity since  then, neuro exam normal will hold off per patient on another one.    From a thorough review of records, Meds tried > 3 months or had side effects: Topamax, rizatriptan, tylenol, baclofen, bisoprolol(BP b-blocker like propranolol),abilify, tegretol,flexeril, lexapro, lisinopril, mobic, robaxin, metoprolol, nortriptyline, zofran, prednisone, seroquel, maxalt,imitrex, tizanidine, trazodone, aimovig contraindicated due to constipation   Try Ajovy monthly for prevention. Insurance may make [place] chane to [employer]. Does not want botox. Could try qulipta next or Vyepti.  Acute: if this works, try [place] or nurtec  No orders of the defined types were placed in this encounter.  Meds ordered this encounter  Medications   Fremanezumab-vfrm SOSY 225 mg   Fremanezumab-vfrm (AJOVY) 225 MG/1.5ML SOAJ    Sig: Inject 225 mg into the skin every 30 (thirty) days.    11-2023 [id number]   Fremanezumab-vfrm (AJOVY) 225 MG/1.5ML SOAJ    Sig: Inject 225 mg into the skin every 30 (thirty) days.    Dispense:  1.5 mL    Refill:  11    Cc: Ellis Savage, NP,  Andreas Blower., MD  Naomie Dean, MD  [facility] [address] [place], [place] [address]  Phone [phone] Fax [phone]

## 2023-01-25 ENCOUNTER — Encounter: Payer: Self-pay | Admitting: Neurology

## 2023-01-25 DIAGNOSIS — G43711 Chronic migraine without aura, intractable, with status migrainosus: Secondary | ICD-10-CM | POA: Insufficient documentation

## 2023-02-02 DIAGNOSIS — J339 Nasal polyp, unspecified: Secondary | ICD-10-CM | POA: Diagnosis not present

## 2023-02-02 DIAGNOSIS — F3175 Bipolar disorder, in partial remission, most recent episode depressed: Secondary | ICD-10-CM | POA: Diagnosis not present

## 2023-02-02 DIAGNOSIS — F900 Attention-deficit hyperactivity disorder, predominantly inattentive type: Secondary | ICD-10-CM | POA: Diagnosis not present

## 2023-02-03 ENCOUNTER — Other Ambulatory Visit (HOSPITAL_COMMUNITY): Payer: Self-pay

## 2023-02-03 ENCOUNTER — Encounter: Payer: Self-pay | Admitting: Hematology and Oncology

## 2023-02-03 ENCOUNTER — Telehealth: Payer: Self-pay

## 2023-02-03 NOTE — Telephone Encounter (Signed)
Received a PA request on CMM for Ajovy-The covered alternatives are: Aimovig (I know it is contraindicated due to constipation), Divalproex Sodium (I did not see this in chart as having tried and failed), Topamax (Was previously Tried and failed), Propranolol (Took a similar medication in the past), and Nurtec. Would you want ot try Divalproex Sodium or Nurtec or would you like fo rme to proceed with PA for Ajovy-If continuing for Ajovy please provide the rationale for not trying the alternatives-Please advise-

## 2023-02-05 NOTE — Telephone Encounter (Signed)
Pharmacy Patient Advocate Encounter   Received notification from CoverMyMeds that prior authorization for AJOVY (fremanezumab-vfrm) injection 225MG /1.5ML syringes is required/requested.   Insurance verification completed.   The patient is insured through [employer] .   Per test claim: PA required; PA submitted to [facility] via CoverMyMeds Key/confirmation #/EOC [id number] Status is pending

## 2023-02-06 ENCOUNTER — Other Ambulatory Visit (HOSPITAL_COMMUNITY): Payer: Self-pay

## 2023-02-06 NOTE — Telephone Encounter (Signed)
Pharmacy Patient Advocate Encounter  Received notification from [facility] that Prior Authorization for AJOVY (fremanezumab-vfrm) injection 225MG /1.5ML syringes has been APPROVED from 02/05/2023 to 07/07/2023. Ran test claim, Copay is $11.20 per 30DS  PA #/Case ID/Reference #: PA Case ID #: [id number]

## 2023-02-17 DIAGNOSIS — F3175 Bipolar disorder, in partial remission, most recent episode depressed: Secondary | ICD-10-CM | POA: Diagnosis not present

## 2023-02-17 DIAGNOSIS — F900 Attention-deficit hyperactivity disorder, predominantly inattentive type: Secondary | ICD-10-CM | POA: Diagnosis not present

## 2023-02-17 DIAGNOSIS — R590 Localized enlarged lymph nodes: Secondary | ICD-10-CM | POA: Diagnosis not present

## 2023-02-17 DIAGNOSIS — J189 Pneumonia, unspecified organism: Secondary | ICD-10-CM | POA: Diagnosis not present

## 2023-02-17 DIAGNOSIS — R918 Other nonspecific abnormal finding of lung field: Secondary | ICD-10-CM | POA: Diagnosis not present

## 2023-02-17 DIAGNOSIS — I2510 Atherosclerotic heart disease of native coronary artery without angina pectoris: Secondary | ICD-10-CM | POA: Diagnosis not present

## 2023-02-17 DIAGNOSIS — I700 Atherosclerosis of aorta: Secondary | ICD-10-CM | POA: Diagnosis not present

## 2023-02-24 DIAGNOSIS — M1711 Unilateral primary osteoarthritis, right knee: Secondary | ICD-10-CM | POA: Diagnosis not present

## 2023-02-24 DIAGNOSIS — M47816 Spondylosis without myelopathy or radiculopathy, lumbar region: Secondary | ICD-10-CM | POA: Diagnosis not present

## 2023-02-24 DIAGNOSIS — Z79891 Long term (current) use of opiate analgesic: Secondary | ICD-10-CM | POA: Diagnosis not present

## 2023-02-24 DIAGNOSIS — G894 Chronic pain syndrome: Secondary | ICD-10-CM | POA: Diagnosis not present

## 2023-02-24 DIAGNOSIS — M62830 Muscle spasm of back: Secondary | ICD-10-CM | POA: Diagnosis not present

## 2023-02-25 DIAGNOSIS — G894 Chronic pain syndrome: Secondary | ICD-10-CM | POA: Diagnosis not present

## 2023-02-25 DIAGNOSIS — G43009 Migraine without aura, not intractable, without status migrainosus: Secondary | ICD-10-CM | POA: Diagnosis not present

## 2023-02-25 DIAGNOSIS — F3130 Bipolar disorder, current episode depressed, mild or moderate severity, unspecified: Secondary | ICD-10-CM | POA: Diagnosis not present

## 2023-03-03 DIAGNOSIS — F3130 Bipolar disorder, current episode depressed, mild or moderate severity, unspecified: Secondary | ICD-10-CM | POA: Diagnosis not present

## 2023-03-05 ENCOUNTER — Ambulatory Visit (INDEPENDENT_AMBULATORY_CARE_PROVIDER_SITE_OTHER): Payer: Medicare HMO

## 2023-03-05 ENCOUNTER — Encounter: Payer: Self-pay | Admitting: Family Medicine

## 2023-03-05 ENCOUNTER — Ambulatory Visit: Payer: Medicare HMO | Admitting: Family Medicine

## 2023-03-05 ENCOUNTER — Other Ambulatory Visit: Payer: Self-pay

## 2023-03-05 VITALS — BP 122/76 | HR 81 | Ht 66.0 in | Wt 185.0 lb

## 2023-03-05 DIAGNOSIS — M1711 Unilateral primary osteoarthritis, right knee: Secondary | ICD-10-CM | POA: Diagnosis not present

## 2023-03-05 DIAGNOSIS — M25461 Effusion, right knee: Secondary | ICD-10-CM | POA: Diagnosis not present

## 2023-03-05 DIAGNOSIS — M25561 Pain in right knee: Secondary | ICD-10-CM

## 2023-03-05 DIAGNOSIS — L920 Granuloma annulare: Secondary | ICD-10-CM | POA: Diagnosis not present

## 2023-03-05 DIAGNOSIS — G8929 Other chronic pain: Secondary | ICD-10-CM | POA: Diagnosis not present

## 2023-03-05 NOTE — Patient Instructions (Signed)
Thank you for coming in today.   Please get an Xray today before you leave   You received an injection today. Seek immediate medical attention if the joint becomes red, extremely painful, or is oozing fluid.   Let me know if your knee is not getting better.

## 2023-03-05 NOTE — Progress Notes (Signed)
I, Stevenson Clinch, CMA acting as a scribe for Clementeen Graham, MD.  [name] is a 58 y.o. female who presents to [facility] today for R knee pain. Pt was previously seen by Dr. Denyse Amass on 11/10/22 for L shoulder pain.  Today, pt c/o R knee pain, worsening over the past week. Pt locates pain to lateral aspect of the knee. Denies injury. Notes tightness and swelling. Mechanical sx present, fall about 2 months ago, no significant injury.   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   Exam:  BP 122/76   Pulse 81   Ht 5\' 6"  (1.676 m)   Wt 185 lb (83.9 kg)   LMP 08/22/2016 (Approximate)   SpO2 95%   BMI 29.86 kg/m  General: Well Developed, well nourished, and in no acute distress.   MSK: Right knee mild effusion normal-appearing otherwise.  Length decreased range of motion.  Antalgic gait. Stable ligamentous exam.    Lab and Radiology Results  Procedure: Real-time Ultrasound Guided Injection of right knee joint superior lateral patella space Device: Philips Affiniti 50G/GE Logiq Images permanently stored and available for review in PACS Verbal informed consent obtained.  Discussed risks and benefits of procedure. Warned about infection, bleeding, hyperglycemia damage to structures among others. Patient expresses understanding and agreement Time-out conducted.   Noted no overlying erythema, induration, or other signs of local infection.   Skin prepped in a sterile fashion.   Local anesthesia: Topical Ethyl chloride.   With sterile technique and under real time ultrasound guidance: 40 mg of Kenalog and 2 mL of Marcaine injected into knee joint. Fluid seen entering the joint capsule.   Completed without difficulty   Pain immediately  resolved suggesting accurate placement of the medication.   Advised to call if fevers/chills, erythema, induration, drainage, or persistent bleeding.   Images permanently stored and available for review in the ultrasound unit.  Impression: Technically successful ultrasound guided injection.   X-ray images right knee obtained today personally and independently interpreted Moderate DJD worse patellofemoral joint.  No acute fractures are visible. Await formal radiology review   Assessment and Plan: 57 y.o. female with chronic right knee pain.  This is an acute exacerbation of what has historically been a chronic knee problem.  2 years ago she had hyaluronic acid injections which worked until recently.  She has an acute exacerbation without clear cause.  Plan for steroid injection today.  Consider repeat hyaluronic acid injection or Zilretta injection in the future if needed.  She will let me know how she feels.  I noticed a rash that she notes has been diagnosed as granuloma annulare by her dermatologist.  I do not think this is related to her knee pain.   PDMP not reviewed this encounter. Orders Placed This Encounter  Procedures   [place] LIMITED JOINT SPACE STRUCTURES LOW RIGHT(NO LINKED CHARGES)    Order Specific Question:   Reason for Exam (SYMPTOM  OR DIAGNOSIS REQUIRED)    Answer:   right knee pain    Order Specific Question:   Preferred imaging location?    Answer:   [facility] Knee AP/LAT W/Sunrise Right    Standing Status:   Future    Number of Occurrences:   1  Standing Expiration Date:   04/05/2023    Order Specific Question:   Reason for Exam (SYMPTOM  OR DIAGNOSIS REQUIRED)    Answer:   right knee pain    Order Specific Question:   Preferred imaging location?    Answer:   [facility]    Order Specific Question:   Is patient pregnant?    Answer:   No   No orders of the defined types were placed in this encounter.    Discussed  warning signs or symptoms. Please see discharge instructions. Patient expresses understanding.   The above documentation has been reviewed and is accurate and complete Clementeen Graham, M.D.

## 2023-03-10 ENCOUNTER — Telehealth: Payer: Self-pay | Admitting: Neurology

## 2023-03-10 ENCOUNTER — Telehealth: Payer: Medicare HMO | Admitting: Neurology

## 2023-03-10 DIAGNOSIS — G43711 Chronic migraine without aura, intractable, with status migrainosus: Secondary | ICD-10-CM

## 2023-03-10 MED ORDER — RIZATRIPTAN BENZOATE 10 MG PO TBDP: 10.0000 mg | ORAL_TABLET | ORAL | 11 refills | Status: DC | PRN

## 2023-03-10 MED ORDER — RIZATRIPTAN BENZOATE 10 MG PO TBDP
10.0000 mg | ORAL_TABLET | ORAL | 11 refills | Status: DC | PRN
Start: 2023-03-10 — End: 2023-03-18

## 2023-03-10 NOTE — Telephone Encounter (Signed)
Pt scheduled for six month vv with Megan for 09/18/23 at 11:30am

## 2023-03-10 NOTE — Telephone Encounter (Signed)
Can you call and get her scheduled with megan for a migraine follow up, video is fine thanks

## 2023-03-10 NOTE — Telephone Encounter (Signed)
Angel, Can you call and get her scheduled with megan for a migraine follow up, video is fine. 6 months.

## 2023-03-10 NOTE — Progress Notes (Signed)
[facility]    Provider:  Dr Lucia Gaskins Requesting Provider: Andreas Blower., MD Primary Care Provider:  Andreas Blower., MD  CC:  chronic migraines  Virtual Visit via Video Note  I connected with [name] on 03/10/23 at 11:30 AM EDT by a video enabled telemedicine application and verified that I am speaking with the correct person using two identifiers.  Location: Patient: home Provider: work   I discussed the limitations of evaluation and management by telemedicine and the availability of in person appointments. The patient expressed understanding and agreed to proceed.   Follow Up Instructions:    I discussed the assessment and treatment plan with the patient. The patient was provided an opportunity to ask questions and all were answered. The patient agreed with the plan and demonstrated an understanding of the instructions.   The patient was advised to call back or seek an in-person evaluation if the symptoms worsen or if the condition fails to improve as anticipated.  I provided 10 minutes of non-face-to-face time during this encounter.   Anson Fret, MD   9/3/2024Arnetha Massy working great. Prior to Ajovy. She has daily headaches and > 10 moderate to severe migraine days a month. Now only 2 migraines a month. Will give Rizatriptan a try again. No significant nausea. SHe has taken 2 shots and is going to be on her third. I see her mother for dementia, [name] [birth year]45.  Patient complains of symptoms per HPI as well as the following symptoms: none . Pertinent negatives and positives per HPI. All others negative   HPI:  [name] is a 57 y.o. female here as requested by Andreas Blower., MD for chronic migraines. 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); Disturbance in sleep behavior; Dysphagia; Esophageal reflux; Perimenopausal; Obstructive sleep apnea  (adult) (pediatric); BMI 33.0-33.9,adult; Right knee pain; Dyslipidemia; Arthritis of right knee; Family history of DVT; Onychomycosis of great toe; Leukocytosis; Positive colorectal cancer screening using Cologuard test; Iron deficiency anemia secondary to inadequate dietary iron intake; Constipation; Hyperlipidemia LDL goal <130; Lymphadenopathy, anterior cervical; Sebaceous cyst; Chronic migraine without aura, with intractable migraine, so stated, with status migrainosus; 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   Here with mother who provides much information. Migraines started years ago more than 7 years but started getting worse 3 years ago. Sharp pains in the back of her head. She has seen her pain doctor for shots in the back of the head and treatment of neck and that didn't help. Cab be unilateral, ends up behind her eye, she saw an eye doctor and all was fine. Dad had migraines. They are Pulsating, pounding.throbbing, phonophobia, dizziness, nasuea no vomiting. She has daily headaches and > 10 moderate to severe migraine gays a month, she thought it was sinus and saw ENT and had prednisone and polyps removed and didn't help. Can last 24 hours untreated. She can wake up with a headache. Excedrin every once in a while, no medication overuse. No aura. She takes dilaudid, fentanyl for other chronic pain. Father had a severe manic episode. She is here with her mother and her emotional dog. No botox. Stress, weather is a trigger. Nothing has helped. Barometric pressure can trigger. Associated dizziness. No other focal neurologic deficits, associated symptoms, inciting events or modifiable factors.  Reviewed notes, labs and imaging from outside physicians, which showed:  From a thorough review of records, Meds tried > 3 months  or had side effects: Topamax, rizatriptan, tylenol, baclofen, bisoprolol(BP b-blocker like propranolol),abilify, tegretol,flexeril, lexapro, lisinopril, mobic, robaxin,  metoprolol, nortriptyline, zofran, prednisone, seroquel, maxalt,imitrex, tizanidine, trazodone, aimovig contraindicated due to constipation    10/17/2020: mri brain  CLINICAL DATA:  Increasing headaches.  Prior trauma.   EXAM: MRI HEAD WITHOUT CONTRAST   TECHNIQUE: Multiplanar, multiecho pulse sequences of the brain and surrounding structures were obtained without intravenous contrast.   COMPARISON:  None.   FINDINGS: Brain: No acute infarction, hemorrhage, hydrocephalus, extra-axial collection or mass lesion. There are a few small T2/FLAIR hyperintensities within the white matter.   Vascular: Major arterial flow voids are maintained at the skull base.   Skull and upper cervical spine: Normal marrow signal.   Sinuses/Orbits: Mucosal thickening of the left frontal sinus, ethmoid air cells, and left greater than right maxillary sinuses with left maxillary air-fluid level.   Other: No sizable mastoid effusion.   IMPRESSION: 1. No evidence of acute intracranial abnormality. 2. There are a few T2/FLAIR hyperintensities within the white matter, which are nonspecific but most likely related to age-appropriate chronic microvascular disease. 3. Paranasal sinus mucosal thickening with left maxillary sinus air-fluid level. Recommend correlation with the presence or absence of signs/symptoms of sinusitis.     Electronically Signed   By: Feliberto Harts MD   On: 10/17/2020 21:09  No results found for this or any previous visit (from the past 2160 hour(s)).   Review of Systems: Patient complains of symptoms per HPI as well as the following symptoms chronic pain. Pertinent negatives and positives per HPI. All others negative.   Social History   Socioeconomic History   Marital status: Married    Spouse name: Not on file   Number of children: 0   Years of education: Not on file   Highest education level: Not on file  Occupational History   Occupation: disabled     Employer: UNEMPLOYED  Tobacco Use   Smoking status: Never   Smokeless tobacco: Never  Vaping Use   Vaping status: Never Used  Substance and Sexual Activity   Alcohol use: No   Drug use: No   Sexual activity: Yes    Birth control/protection: None  Other Topics Concern   Not on file  Social History Narrative   Not on file   Social Determinants of Health   Financial Resource Strain: Not on file  Food Insecurity: Food Insecurity Present (05/26/2017)   Hunger Vital Sign    Worried About Running Out of Food in the Last Year: Sometimes true    Ran Out of Food in the Last Year: Never true  Transportation Needs: Not on file  Physical Activity: Not on file  Stress: Not on file  Social Connections: Not on file  Intimate Partner Violence: Not on file    Family History  Problem Relation Age of Onset   Cancer Father        Leukemia   Diabetes Father    Heart disease Father        first PCI in his 49s   Diabetes Mother    Cancer Maternal Grandmother        Stomach   Colon cancer Maternal Grandmother    Cancer Paternal Grandmother    Cancer Paternal Aunt    Diabetes Paternal Aunt    Cancer Paternal Uncle    Cancer Paternal Aunt    Rectal cancer Neg Hx    Stomach cancer Neg Hx    Esophageal cancer Neg Hx  Past Medical History:  Diagnosis Date   Allergy    Anxiety    Arthritis    Asthma    History of Asthma   Bipolar disorder (HCC)    Chest pain, atypical 11/30/2012   Clotting disorder (HCC)    Depression    Diabetes mellitus without complication (HCC)    type 2, pre-diabetic before she lost weight   GERD (gastroesophageal reflux disease)    H/O degenerative disc disease    L4-L5, L5-S1   Heart murmur    Hyperglycemia    Postoperative hyperglycemia   Hypertension    hx of   Neuromuscular disorder (HCC)    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   Obesity 07/14/2012   OSA (obstructive sleep apnea)    mild   Pneumonia    Polycystic disease, ovaries     Postoperative anemia    2018 - while dieting  hx of   Reflux    Sinus tachycardia 07/14/2012   Pt says HR > 100 is consistent   Sleep apnea    has had two tests, different results- no CPAP used    Patient Active Problem List   Diagnosis Date Noted   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   Chronic migraine without aura, with intractable migraine, so stated, with status migrainosus 01/25/2023   Lymphadenopathy, anterior cervical 07/13/2020   Sebaceous cyst 07/13/2020   Iron deficiency anemia secondary to inadequate dietary iron intake 01/12/2020   Constipation 12/13/2019   Hyperlipidemia LDL goal <130 09/08/2019   Positive colorectal cancer screening using Cologuard test 03/02/2018   Leukocytosis 01/26/2018   Onychomycosis of great toe 12/22/2017   Family history of DVT 03/20/2017   Arthritis of right knee 05/13/2016   Dyslipidemia 03/04/2016   Right knee pain 03/03/2016   Pre-diabetes 11/01/2015   Allergic rhinitis 05/28/2015   BMI 33.0-33.9,adult 05/24/2013   Polycystic ovary disease 07/14/2012   Obstructive sleep apnea (adult) (pediatric) 02/04/2012   Perimenopausal 11/10/2011   Disturbance in sleep behavior 09/16/2011   Asthma 05/23/2011   Backache 05/23/2011   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) 05/23/2011   Dysphagia 05/23/2011   Esophageal reflux 05/23/2011    Past Surgical History:  Procedure Laterality Date   COLONOSCOPY     FOOT SURGERY     Right plantar facsiitis scrape   KNEE SURGERY Bilateral 2007   LAPAROSCOPIC GASTRIC SLEEVE RESECTION N/A 03/30/2017   Procedure: LAPAROSCOPIC GASTRIC SLEEVE RESECTION, UPPER ENDO;  Surgeon: Gaynelle Adu, MD;  Location: [facility];  Service: General;  Laterality: N/A;   NASAL SINUS SURGERY  2004   OVARIAN CYST REMOVAL  1998   A cyst on the fallopian tube removed   SPINAL FUSION     TONSILLECTOMY  1996    Current Outpatient Medications  Medication Sig Dispense Refill   albuterol (VENTOLIN HFA) 108 (90 Base) MCG/ACT  inhaler Inhale 2 puffs into the lungs every 4 (four) hours as needed for wheezing or shortness of breath.     ALPRAZolam (XANAX XR) 1 MG 24 hr tablet Take 1 mg by mouth 2 (two) times daily.     ALPRAZolam (XANAX) 1 MG tablet Take 1 mg by mouth every morning.     Biotin 2500 MCG CAPS Take by mouth daily in the afternoon.     EPINEPHrine 0.3 mg/0.3 mL IJ SOAJ injection Inject into the muscle.     escitalopram (LEXAPRO) 20 MG tablet Take 1 tablet (20 mg total) by mouth daily with breakfast. 30 tablet 3  estradiol (ESTRACE) 1 MG tablet      fentaNYL 37.5 MCG/HR PT72 Apply 1 patch topically every 3 (three) days.     FIBER PO Take 5 mg by mouth daily.     fluconazole (DIFLUCAN) 150 MG tablet TAKE 1 TABLET (150 MG TOTAL) BY MOUTH EVERY 3 (THREE) DAYS 5 tablet 0   fluticasone (FLOVENT HFA) 110 MCG/ACT inhaler Inhale 2 puffs into the lungs in the morning and at bedtime. 1 each 12   Fremanezumab-vfrm (AJOVY) 225 MG/1.5ML SOAJ Inject 225 mg into the skin every 30 (thirty) days.     Fremanezumab-vfrm (AJOVY) 225 MG/1.5ML SOAJ Inject 225 mg into the skin every 30 (thirty) days. 1.5 mL 11   HYDROmorphone (DILAUDID) 2 MG tablet Take 1 tablet (2 mg total) by mouth every 6 (six) hours. 120 tablet 0   lidocaine (XYLOCAINE) 2 % jelly Apply 1 application topically as needed. 30 mL 2   medroxyPROGESTERone (PROVERA) 2.5 MG tablet Take 1 tablet (2.5 mg total) by mouth daily. 90 tablet 5   Melatonin 10 MG CAPS Take by mouth at bedtime.     montelukast (SINGULAIR) 10 MG tablet Take 10 mg by mouth at bedtime.     MOVANTIK 25 MG TABS tablet Take 25 mg by mouth at bedtime.      Multiple Vitamins-Minerals (MULTIVITAMIN GUMMIES WOMENS PO) Take by mouth daily in the afternoon.     naloxone (NARCAN) nasal spray 4 mg/0.1 mL SMARTSIG:Both Nares     pantoprazole (PROTONIX) 40 MG tablet Take 1 tablet (40 mg total) by mouth daily. Office visit for further refills 90 tablet 0   Sennosides (SENOKOT PO) Take 2 tablets by mouth at  bedtime. 2 tablets at night      temazepam (RESTORIL) 30 MG capsule Take 30 mg by mouth at bedtime as needed.     tiZANidine (ZANAFLEX) 2 MG tablet Take 2 mg by mouth 3 (three) times daily as needed.     topiramate (TOPAMAX) 50 MG tablet Take 50 mg by mouth 2 (two) times daily.     traZODone (DESYREL) 100 MG tablet Take 300 mg by mouth at bedtime.     triamcinolone cream (KENALOG) 0.1 % APPLY 1 APPLICATION ON THE SKIN AS DIRECTED APPLY 1-2X PER DAY TO ELBOWS     Current Facility-Administered Medications  Medication Dose Route Frequency Provider Last Rate Last Admin   Fremanezumab-vfrm SOSY 225 mg  225 mg Subcutaneous Once         Allergies as of 03/10/2023 - Review Complete 03/05/2023  Allergen Reaction Noted   Carbamazepine Swelling 07/09/2018   Gabapentin Nausea And Vomiting and Anaphylaxis 04/22/2011   Pineapple Shortness Of Breath and Swelling    Shellfish-derived products Anaphylaxis 12/13/2014   Erythromycin Swelling 04/22/2011   Latex Rash 04/22/2011   Pregabalin Swelling 04/22/2011   Duloxetine Other (See Comments) 05/28/2015   Other  03/19/2018   Pollen extract Other (See Comments) 02/04/2012    Vitals: LMP 08/22/2016 (Approximate)  Last Weight:  Wt Readings from Last 1 Encounters:  03/05/23 185 lb (83.9 kg)   Last Height:   Ht Readings from Last 1 Encounters:  03/05/23 5\' 6"  (1.676 m)    Physical exam: Exam: Gen: NAD, conversant      CV:  Denies palpitations or chest pain or SOB. VS: Breathing at a normal rate. Weight appears within normal limits. Not febrile. Eyes: Conjunctivae clear without exudates or hemorrhage  Neuro: Detailed Neurologic Exam  Speech:    Speech is normal; fluent  and spontaneous with normal comprehension.  Cognition:    The patient is oriented to person, place, and time;     recent and remote memory intact;     language fluent;     normal attention, concentration,     fund of knowledge Cranial Nerves:    The pupils are equal,  round, and reactive to light.  Visual fields are full to finger confrontation. Extraocular movements are intact.  The face is symmetric with normal sensation. The palate elevates in the midline. Hearing intact. Voice is normal. Shoulder shrug is normal. The tongue has normal motion without fasciculations.   Gait:    Normal native gait  Motor Observation:   no involuntary movements noted. Tone:    Appears normal  Posture:    Posture is normal. normal erect    Strength:    Strength is anti-gravity and symmetric in the upper and lower limbs.      Sensation: intact to LT          Assessment/Plan:  Patient with chronic migraines > 3 years. MRI did not show any pathology for migraines, no changes in quality/severity since then, neuro exam normal will hold off per patient on another one.   Ajovy working great, continue Try rizatriptan again  Meds ordered this encounter  Medications   DISCONTD: rizatriptan (MAXALT-MLT) 10 MG disintegrating tablet    Sig: Take 1 tablet (10 mg total) by mouth as needed for migraine. Take RIGHT at the onset. May repeat in 2 hours if needed. Max 20 doses in one day.    Dispense:  9 tablet    Refill:  11   rizatriptan (MAXALT-MLT) 10 MG disintegrating tablet    Sig: Take 1 tablet (10 mg total) by mouth as needed for migraine. May repeat in 2 hours if needed. Take RIGHT at onset of migraine. Max 2 pills in one day.    Dispense:  9 tablet    Refill:  11    Delete prior maxalt script      From a thorough review of records, Meds tried > 3 months or had side effects: Topamax, rizatriptan, tylenol, baclofen, bisoprolol(BP b-blocker like propranolol),abilify, tegretol,flexeril, lexapro, lisinopril, mobic, robaxin, metoprolol, nortriptyline, zofran, prednisone, seroquel, maxalt,imitrex, tizanidine, trazodone, aimovig contraindicated due to constipation   Try Ajovy monthly for prevention. Insurance may make [place] chane to [employer]. Does not want botox. Could try  qulipta next or Vyepti.  Acute: if this works, try [place] or nurtec    Cc: Andreas Blower., MD,  Andreas Blower., MD  Naomie Dean, MD  [facility] [address] [place], [place] [address]  Phone [phone] Fax [phone]

## 2023-03-11 ENCOUNTER — Encounter: Payer: Self-pay | Admitting: Family Medicine

## 2023-03-11 ENCOUNTER — Ambulatory Visit: Payer: Medicare HMO | Admitting: Neurology

## 2023-03-11 ENCOUNTER — Ambulatory Visit: Payer: Medicare HMO | Admitting: Family Medicine

## 2023-03-11 ENCOUNTER — Encounter: Payer: Self-pay | Admitting: Neurology

## 2023-03-12 ENCOUNTER — Telehealth: Payer: Self-pay

## 2023-03-12 NOTE — Telephone Encounter (Signed)
Nobie Putnam, [place], [place] Please Berkley Harvey Zilretta and visco R knee.

## 2023-03-16 ENCOUNTER — Telehealth: Payer: Self-pay

## 2023-03-16 DIAGNOSIS — G8929 Other chronic pain: Secondary | ICD-10-CM

## 2023-03-16 DIAGNOSIS — M1711 Unilateral primary osteoarthritis, right knee: Secondary | ICD-10-CM

## 2023-03-16 NOTE — Telephone Encounter (Signed)
VOB initiated for ZILRETTA for RIGHT knee OA.

## 2023-03-16 NOTE — Telephone Encounter (Signed)
VOB initiated for ORTHOVISC for RIGHT knee OA.

## 2023-03-16 NOTE — Telephone Encounter (Signed)
[name], [place], [place] Please Berkley Harvey Zilretta and visco R knee.

## 2023-03-16 NOTE — Progress Notes (Signed)
Right knee x-ray shows medium arthritis changes.

## 2023-03-17 NOTE — Telephone Encounter (Signed)
Prior Auth required for [employer]

## 2023-03-17 NOTE — Telephone Encounter (Signed)
Pending

## 2023-03-18 ENCOUNTER — Other Ambulatory Visit: Payer: Self-pay | Admitting: Neurology

## 2023-03-18 DIAGNOSIS — G43711 Chronic migraine without aura, intractable, with status migrainosus: Secondary | ICD-10-CM

## 2023-03-18 MED ORDER — ALMOTRIPTAN MALATE 12.5 MG PO TABS
12.5000 mg | ORAL_TABLET | ORAL | 6 refills | Status: DC | PRN
Start: 2023-03-18 — End: 2023-08-12

## 2023-03-18 NOTE — Telephone Encounter (Signed)
Prior Authorization initiated for [facility] via [employer] Case ID: [id number]  Pending review

## 2023-03-19 ENCOUNTER — Telehealth: Payer: Self-pay | Admitting: *Deleted

## 2023-03-19 DIAGNOSIS — F3130 Bipolar disorder, current episode depressed, mild or moderate severity, unspecified: Secondary | ICD-10-CM | POA: Diagnosis not present

## 2023-03-19 NOTE — Telephone Encounter (Signed)
Patient states unable to pick up almotriptan because it needs a PA.

## 2023-03-19 NOTE — Telephone Encounter (Signed)
Pending

## 2023-03-19 NOTE — Telephone Encounter (Signed)
ZILRETTA for RIGHT knee OA   Primary Insurance: A[employer]na Medicare Co-pay: $20 Co-insurance: 20% Deductible: does not apply  Prior Auth: APPROVED PA# [id number]  Valid: 03/18/23-06/17/23    Knee Injection History: 01/16/21 - Kenalog RIGHT 02/28/21 - Orthovisc #1 RIGHT 03/06/21 - Orthovisc #2 RIGHT 03/13/21 - Orthovisc #3 RIGHT 03/05/23 - Kenalog RIGHT

## 2023-03-19 NOTE — Telephone Encounter (Signed)
Scheduled 9/16

## 2023-03-19 NOTE — Telephone Encounter (Signed)
Prior Auth for [employer] APPROVED PA# [id number]  Valid: 03/18/23-06/17/23

## 2023-03-20 ENCOUNTER — Encounter: Payer: Self-pay | Admitting: Hematology and Oncology

## 2023-03-20 ENCOUNTER — Other Ambulatory Visit (HOSPITAL_COMMUNITY): Payer: Self-pay

## 2023-03-20 ENCOUNTER — Telehealth: Payer: Self-pay

## 2023-03-20 NOTE — Telephone Encounter (Signed)
PA request has been Submitted. New Encounter created for follow up. For additional info see Pharmacy Prior Auth telephone encounter from 03/20/2023.

## 2023-03-20 NOTE — Telephone Encounter (Signed)
Pharmacy Patient Advocate Encounter   Received notification from Physician's Office that prior authorization for Almotriptan Malate 12.5MG  tablets is required/requested.   Insurance verification completed.   The patient is insured through [facility] .   Per test claim: PA required; PA submitted to [facility] via CoverMyMeds Key/confirmation #/EOC [id number] Status is pending

## 2023-03-23 ENCOUNTER — Ambulatory Visit: Payer: Medicare HMO | Admitting: Family Medicine

## 2023-03-23 ENCOUNTER — Other Ambulatory Visit: Payer: Self-pay

## 2023-03-23 ENCOUNTER — Other Ambulatory Visit (HOSPITAL_COMMUNITY): Payer: Self-pay

## 2023-03-23 DIAGNOSIS — M25561 Pain in right knee: Secondary | ICD-10-CM

## 2023-03-23 DIAGNOSIS — G8929 Other chronic pain: Secondary | ICD-10-CM | POA: Diagnosis not present

## 2023-03-23 DIAGNOSIS — M1711 Unilateral primary osteoarthritis, right knee: Secondary | ICD-10-CM

## 2023-03-23 MED ORDER — TRIAMCINOLONE ACETONIDE 32 MG IX SRER
32.0000 mg | Freq: Once | INTRA_ARTICULAR | Status: AC
Start: 2023-03-23 — End: 2023-03-23
  Administered 2023-03-23: 32 mg via INTRA_ARTICULAR

## 2023-03-23 NOTE — Telephone Encounter (Signed)
Prior Auth REQUIRED for [employer]

## 2023-03-23 NOTE — Telephone Encounter (Signed)
Pharmacy Patient Advocate Encounter  Received notification from [facility] that Prior Authorization for Almotriptan Malate 12.5MG  tablets has been APPROVED from 03/20/2023 to 07/07/2023   PA #/Case ID/Reference #: PA Case ID #: [id number]

## 2023-03-23 NOTE — Patient Instructions (Signed)
Thank you for coming in today.   You received an injection today. Seek immediate medical attention if the joint becomes red, extremely painful, or is oozing fluid.  

## 2023-03-23 NOTE — Progress Notes (Signed)
  Zilretta injection right knee Procedure: Real-time Ultrasound Guided Injection of right knee joint superior lateral patellar space Device: Philips Affiniti 50G Images permanently stored and available for review in PACS Verbal informed consent obtained.  Discussed risks and benefits of procedure. Warned about infection, hyperglycemia bleeding, damage to structures among others. Patient expresses understanding and agreement Time-out conducted.   Noted no overlying erythema, induration, or other signs of local infection.   Skin prepped in a sterile fashion.   Local anesthesia: Topical Ethyl chloride.   With sterile technique and under real time ultrasound guidance: Zilretta 32 mg injected into knee joint. Fluid seen entering the joint capsule.   Completed without difficulty   Advised to call if fevers/chills, erythema, induration, drainage, or persistent bleeding.   Images permanently stored and available for review in the ultrasound unit.  Impression: Technically successful ultrasound guided injection.  Lot number: [id number]  Working on authorization for hyaluronic acid injection which we could do in the future if this does not work well enough.

## 2023-03-24 NOTE — Telephone Encounter (Signed)
Prior Authorization initiated for [facility] via [employer] Case ID: [id number]  Pending review

## 2023-03-26 DIAGNOSIS — F3130 Bipolar disorder, current episode depressed, mild or moderate severity, unspecified: Secondary | ICD-10-CM | POA: Diagnosis not present

## 2023-03-26 NOTE — Telephone Encounter (Signed)
Pt received Zilretta inj for RIGHT knee OA 03/23/23. Can consider repeat injection on or after 06/16/23.

## 2023-03-26 NOTE — Telephone Encounter (Signed)
Orthovisc is non-preferred with patient's plan.   Preferred products are: Durolane Euflexxa Synvisc SynviscOne  Will run benefits for Durolane.

## 2023-03-28 ENCOUNTER — Encounter: Payer: Self-pay | Admitting: Internal Medicine

## 2023-03-30 NOTE — Telephone Encounter (Signed)
VOB initiated for Durolane for RIGHT knee OA.

## 2023-03-31 DIAGNOSIS — F3130 Bipolar disorder, current episode depressed, mild or moderate severity, unspecified: Secondary | ICD-10-CM | POA: Diagnosis not present

## 2023-04-02 NOTE — Telephone Encounter (Signed)
Prior Auth required for [employer]

## 2023-04-07 NOTE — Telephone Encounter (Signed)
Prior Authorization initiated for [facility] via [employer] Case ID: [id number]  Pending review

## 2023-04-08 DIAGNOSIS — G894 Chronic pain syndrome: Secondary | ICD-10-CM | POA: Diagnosis not present

## 2023-04-08 DIAGNOSIS — M47816 Spondylosis without myelopathy or radiculopathy, lumbar region: Secondary | ICD-10-CM | POA: Diagnosis not present

## 2023-04-08 DIAGNOSIS — F3130 Bipolar disorder, current episode depressed, mild or moderate severity, unspecified: Secondary | ICD-10-CM | POA: Diagnosis not present

## 2023-04-08 DIAGNOSIS — M1711 Unilateral primary osteoarthritis, right knee: Secondary | ICD-10-CM | POA: Diagnosis not present

## 2023-04-08 DIAGNOSIS — M62830 Muscle spasm of back: Secondary | ICD-10-CM | POA: Diagnosis not present

## 2023-04-08 NOTE — Telephone Encounter (Signed)
DUROLANE for RIGHT knee OA   Primary Insurance: Aetna Medicare Co-pay: $20 Co-insurance: 20% Deductible: does not apply Prior Auth: APPROVED PA# [id number] Valid: 04/07/23-04/06/24   Knee Injection History 01/16/21 - Kenalog RIGHT 02/28/21 - Orthovisc #1 RIGHT 03/06/21 - Orthovisc #2 RIGHT 03/13/21 - Orthovisc #3 RIGHT 03/05/23 - Kenalog RIGHT 03/23/23 - Zilretta RIGHT

## 2023-04-08 NOTE — Telephone Encounter (Signed)
Prior Auth for [facility] APPROVED PA# [id number] Valid: 04/07/23-04/06/24

## 2023-04-08 NOTE — Telephone Encounter (Signed)
Patient was given Zilretta on 9/16.

## 2023-04-09 DIAGNOSIS — M2550 Pain in unspecified joint: Secondary | ICD-10-CM | POA: Diagnosis not present

## 2023-04-09 DIAGNOSIS — J4540 Moderate persistent asthma, uncomplicated: Secondary | ICD-10-CM | POA: Diagnosis not present

## 2023-04-09 DIAGNOSIS — Z1211 Encounter for screening for malignant neoplasm of colon: Secondary | ICD-10-CM | POA: Diagnosis not present

## 2023-04-09 DIAGNOSIS — Z23 Encounter for immunization: Secondary | ICD-10-CM | POA: Diagnosis not present

## 2023-04-09 DIAGNOSIS — K219 Gastro-esophageal reflux disease without esophagitis: Secondary | ICD-10-CM | POA: Diagnosis not present

## 2023-04-09 DIAGNOSIS — Z1159 Encounter for screening for other viral diseases: Secondary | ICD-10-CM | POA: Diagnosis not present

## 2023-04-13 ENCOUNTER — Other Ambulatory Visit: Payer: Self-pay

## 2023-04-13 DIAGNOSIS — G8929 Other chronic pain: Secondary | ICD-10-CM

## 2023-04-20 DIAGNOSIS — K5903 Drug induced constipation: Secondary | ICD-10-CM | POA: Diagnosis not present

## 2023-04-20 DIAGNOSIS — K219 Gastro-esophageal reflux disease without esophagitis: Secondary | ICD-10-CM | POA: Diagnosis not present

## 2023-04-20 DIAGNOSIS — Z1211 Encounter for screening for malignant neoplasm of colon: Secondary | ICD-10-CM | POA: Diagnosis not present

## 2023-04-20 DIAGNOSIS — Z860100 Personal history of colon polyps, unspecified: Secondary | ICD-10-CM | POA: Diagnosis not present

## 2023-04-20 NOTE — Telephone Encounter (Signed)
Note opened in error.

## 2023-04-21 DIAGNOSIS — F3130 Bipolar disorder, current episode depressed, mild or moderate severity, unspecified: Secondary | ICD-10-CM | POA: Diagnosis not present

## 2023-04-22 ENCOUNTER — Encounter (HOSPITAL_BASED_OUTPATIENT_CLINIC_OR_DEPARTMENT_OTHER): Payer: Self-pay | Admitting: Emergency Medicine

## 2023-04-22 ENCOUNTER — Emergency Department (HOSPITAL_BASED_OUTPATIENT_CLINIC_OR_DEPARTMENT_OTHER)
Admission: EM | Admit: 2023-04-22 | Discharge: 2023-04-22 | Disposition: A | Discharge status: Home / Self Care | Payer: Medicare HMO | Attending: Emergency Medicine | Admitting: Emergency Medicine

## 2023-04-22 ENCOUNTER — Emergency Department (HOSPITAL_BASED_OUTPATIENT_CLINIC_OR_DEPARTMENT_OTHER): Payer: Medicare HMO

## 2023-04-22 ENCOUNTER — Other Ambulatory Visit: Payer: Self-pay

## 2023-04-22 DIAGNOSIS — S0990XA Unspecified injury of head, initial encounter: Secondary | ICD-10-CM | POA: Diagnosis not present

## 2023-04-22 DIAGNOSIS — M542 Cervicalgia: Secondary | ICD-10-CM | POA: Diagnosis not present

## 2023-04-22 DIAGNOSIS — W1830XA Fall on same level, unspecified, initial encounter: Secondary | ICD-10-CM | POA: Diagnosis not present

## 2023-04-22 DIAGNOSIS — S0993XA Unspecified injury of face, initial encounter: Secondary | ICD-10-CM | POA: Diagnosis not present

## 2023-04-22 DIAGNOSIS — Y92002 Bathroom of unspecified non-institutional (private) residence single-family (private) house as the place of occurrence of the external cause: Secondary | ICD-10-CM | POA: Diagnosis not present

## 2023-04-22 DIAGNOSIS — S0083XA Contusion of other part of head, initial encounter: Secondary | ICD-10-CM | POA: Diagnosis not present

## 2023-04-22 DIAGNOSIS — S161XXA Strain of muscle, fascia and tendon at neck level, initial encounter: Secondary | ICD-10-CM | POA: Insufficient documentation

## 2023-04-22 DIAGNOSIS — M25561 Pain in right knee: Secondary | ICD-10-CM | POA: Diagnosis not present

## 2023-04-22 DIAGNOSIS — S8001XA Contusion of right knee, initial encounter: Secondary | ICD-10-CM | POA: Diagnosis not present

## 2023-04-22 DIAGNOSIS — Z91040 Latex allergy status: Secondary | ICD-10-CM | POA: Insufficient documentation

## 2023-04-22 DIAGNOSIS — S199XXA Unspecified injury of neck, initial encounter: Secondary | ICD-10-CM | POA: Diagnosis not present

## 2023-04-22 DIAGNOSIS — Z9181 History of falling: Secondary | ICD-10-CM | POA: Diagnosis not present

## 2023-04-22 DIAGNOSIS — M1711 Unilateral primary osteoarthritis, right knee: Secondary | ICD-10-CM | POA: Diagnosis not present

## 2023-04-22 DIAGNOSIS — W19XXXA Unspecified fall, initial encounter: Secondary | ICD-10-CM

## 2023-04-22 NOTE — Discharge Instructions (Signed)
1.  Follow-up with your doctor for recheck in 3 to 5 days. 2.  Reviewed the possibility of oversedation with your psychiatrist and family doctor.  Take care not to take additional medications that are sedating if you are having problems with drowsiness and imbalance. 3.  Do not blow your nose.  Just wipe the nose if there is any dripping.  Sleep with the head of your bed elevated about 30 degrees.  Take extra strength Tylenol for pain and apply cool packs to the areas of bruising on your face.

## 2023-04-22 NOTE — ED Provider Notes (Signed)
[facility] Provider Note   CSN: [id number] Arrival date & time: 04/22/23  1551     History  Chief Complaint  Patient presents with   Marletta Lor    [name] is a 57 y.o. female.  HPI Patient reports that she fell in her bathroom 2 nights ago.  Patient reports that she came to the emergency department today because she has tender bruising and swelling on her forehead that seems worse today.  Patient reports that on Monday evening she had taken her sleep medications and was trying to get ready for bed.  She reports she has trouble sleeping so she is taking several medications and her doctor has started her on some newer medications as well.  Patient reports that her husband was actually videoing her due to feeling that she was oversedated so her psychiatrist could know how she was functioning at home.  He videoed her stumbling some and getting her close into a hamper and then losing her balance and falling forward onto her face in the bathroom.  Patient reports at that time her nose bled a lot.  She is also had pain in the right knee.  She has however been walking on it.    Home Medications Prior to Admission medications   Medication Sig Start Date End Date Taking? Authorizing Provider  albuterol (VENTOLIN HFA) 108 (90 Base) MCG/ACT inhaler Inhale 2 puffs into the lungs every 4 (four) hours as needed for wheezing or shortness of breath.    [provider]  almotriptan (AXERT) 12.5 MG tablet Take 1 tablet (12.5 mg total) by mouth as needed for migraine. may repeat in 2 hours if needed, max 2 daily. 03/18/23   Anson Fret, MD  ALPRAZolam (XANAX XR) 1 MG 24 hr tablet Take 1 mg by mouth 2 (two) times daily.    [provider]  ALPRAZolam Prudy Feeler) 1 MG tablet Take 1 mg by mouth every morning. 09/27/20   [provider]  Biotin 2500 MCG CAPS Take by mouth daily in the afternoon.    [provider]  EPINEPHrine  0.3 mg/0.3 mL IJ SOAJ injection Inject into the muscle. 01/23/21   [provider]  escitalopram (LEXAPRO) 20 MG tablet Take 1 tablet (20 mg total) by mouth daily with breakfast. 07/09/18   Rodolph Bong, MD  estradiol (ESTRACE) 1 MG tablet     [provider]  fentaNYL 37.5 MCG/HR PT72 Apply 1 patch topically every 3 (three) days. 10/21/22   [provider]  FIBER PO Take 5 mg by mouth daily.    [provider]  fluconazole (DIFLUCAN) 150 MG tablet TAKE 1 TABLET (150 MG TOTAL) BY MOUTH EVERY 3 (THREE) DAYS 11/14/20   Hilts, Casimiro Needle, MD  fluticasone (FLOVENT HFA) 110 MCG/ACT inhaler Inhale 2 puffs into the lungs in the morning and at bedtime. 10/11/20   Hilts, Casimiro Needle, MD  Fremanezumab-vfrm (AJOVY) 225 MG/1.5ML SOAJ Inject 225 mg into the skin every 30 (thirty) days. 01/23/23   Anson Fret, MD  Fremanezumab-vfrm (AJOVY) 225 MG/1.5ML SOAJ Inject 225 mg into the skin every 30 (thirty) days. 01/23/23   Anson Fret, MD  HYDROmorphone (DILAUDID) 2 MG tablet Take 1 tablet (2 mg total) by mouth every 6 (six) hours. 06/20/22     lidocaine (XYLOCAINE) 2 % jelly Apply 1 application topically as needed. 05/16/19   Allie Bossier, MD  medroxyPROGESTERone (PROVERA) 2.5 MG tablet Take 1 tablet (2.5  mg total) by mouth daily. 05/16/19   Allie Bossier, MD  Melatonin 10 MG CAPS Take by mouth at bedtime.    [provider]  Multiple Vitamins-Minerals (MULTIVITAMIN GUMMIES WOMENS PO) Take by mouth daily in the afternoon.    [provider]  naloxone [facility]) nasal spray 4 mg/0.1 mL SMARTSIG:Both Nares 11/23/20   [provider]  pantoprazole (PROTONIX) 40 MG tablet Take 1 tablet (40 mg total) by mouth daily. Office visit for further refills 12/24/20   Hilarie Fredrickson, MD  Sennosides (SENOKOT PO) Take 2 tablets by mouth at bedtime. 2 tablets at night     [provider]  temazepam (RESTORIL) 30 MG capsule Take 30 mg by mouth at bedtime as needed. 02/11/20    [provider]  tiZANidine (ZANAFLEX) 2 MG tablet Take 2 mg by mouth 3 (three) times daily as needed. 08/16/18   [provider]  traZODone (DESYREL) 100 MG tablet Take 300 mg by mouth at bedtime. 05/09/19   [provider]  triamcinolone cream (KENALOG) 0.1 % APPLY 1 APPLICATION ON THE SKIN AS DIRECTED APPLY 1-2X PER DAY TO ELBOWS 03/12/21   [provider]      Allergies    Carbamazepine, Gabapentin, Pineapple, Shellfish-derived products, Erythromycin, Latex, Pregabalin, Duloxetine, Other, and Pollen extract    Review of Systems   Review of Systems  Physical Exam Updated Vital Signs BP 129/82   Pulse 62   Temp 98.1 F (36.7 C) (Oral)   Resp 18   Ht 5\' 7"  (1.702 m)   Wt 83.9 kg   LMP 08/22/2016 (Approximate)   SpO2 100%   BMI 28.98 kg/m  Physical Exam Constitutional:      Comments: Patient is alert with clear mental status.  No respiratory distress.  Well-nourished well-developed.  HENT:     Head:     Comments: Patient has ecchymosis under the right eye but no significant general swelling of the lid.  Eye is easily open.  Patient has a boggy hematoma in the center of her forehead.  No overlying laceration or abrasion.  Some petechial abrasion to the bridge of the nose.    Nose:     Comments: Both nares are clear.  I do not appreciate obstructing clot.    Mouth/Throat:     Comments: Oropharynx clear.  No posterior clot from the nasopharynx. Eyes:     Extraocular Movements: Extraocular movements intact.     Pupils: Pupils are equal, round, and reactive to light.     Comments: Normal extraocular motions.  No subconjunctival hematoma.  No hyphema  Neck:     Comments: Patient endorses some tenderness to the cervical spine in the midline but about C4-C5. Cardiovascular:     Rate and Rhythm: Normal rate and regular rhythm.  Pulmonary:     Effort: Pulmonary effort is normal.     Breath sounds: Normal breath sounds.  Abdominal:     General:  There is no distension.     Palpations: Abdomen is soft.     Tenderness: There is no abdominal tenderness. There is no guarding.  Musculoskeletal:        General: Normal range of motion.     Cervical back: Neck supple.     Comments: Patient endorses tenderness to the right knee but there is no apparent effusion or abrasion.  No deformity.  All movements of extremities is normal.  Skin:    General: Skin is warm and dry.  Neurological:  General: No focal deficit present.     Mental Status: She is oriented to person, place, and time.     Motor: No weakness.     Coordination: Coordination normal.  Psychiatric:        Mood and Affect: Mood normal.     ED Results / Procedures / Treatments   Labs (all labs ordered are listed, but only abnormal results are displayed) Labs Reviewed - No data to display  EKG None  Radiology DG Knee Complete 4 Views Right  Result Date: 04/22/2023 CLINICAL DATA:  Right knee pain after fall on Monday. EXAM: RIGHT KNEE - COMPLETE 4+ VIEW COMPARISON:  Knee radiograph 03/05/2023 FINDINGS: No acute fracture or dislocation. Stable tricompartmental degenerative change. Probable ossified intra-articular body in Hoffa's fat pad. No significant knee joint effusion. No erosion or other interval change. IMPRESSION: 1. No acute fracture or dislocation of the right knee. 2. Stable tricompartmental degenerative change. Electronically Signed   By: Narda Rutherford M.D.   On: 04/22/2023 20:07   CT Maxillofacial Wo Contrast  Result Date: 04/22/2023 CLINICAL DATA:  Head trauma, moderate-severe; Facial trauma, blunt; Neck trauma, dangerous injury mechanism (Age 51-64y). EXAM: CT HEAD WITHOUT CONTRAST CT MAXILLOFACIAL WITHOUT CONTRAST CT CERVICAL SPINE WITHOUT CONTRAST TECHNIQUE: Multidetector CT imaging of the head, cervical spine, and maxillofacial structures were performed using the standard protocol without intravenous contrast. Multiplanar CT image reconstructions of the  cervical spine and maxillofacial structures were also generated. RADIATION DOSE REDUCTION: This exam was performed according to the departmental dose-optimization program which includes automated exposure control, adjustment of the mA and/or kV according to patient size and/or use of iterative reconstruction technique. COMPARISON:  Head MRI 10/17/2020 FINDINGS: CT HEAD FINDINGS Brain: There is no evidence of an acute infarct, intracranial hemorrhage, mass, midline shift, or extra-axial fluid collection. The ventricles and sulci are normal. Vascular: No hyperdense vessel. Skull: No fracture or suspicious osseous lesion. Other: None. CT MAXILLOFACIAL FINDINGS Osseous: No acute fracture or mandibular dislocation. Orbits: Unremarkable. Sinuses: Prior functional endoscopic sinus surgery with mild scattered mucosal thickening. No fluid level. Clear mastoid air cells and middle ear cavities. Soft tissues: Unremarkable. CT CERVICAL SPINE FINDINGS Alignment: Cervical spine straightening.  No significant listhesis. Skull base and vertebrae: No acute fracture or suspicious osseous lesion. Soft tissues and spinal canal: No prevertebral fluid or swelling. No visible canal hematoma. Disc levels: Mild spondylosis. Widespread facet arthrosis, most advanced in the mid upper cervical spine (particularly severe on the right at C4-5). Upper chest: Clear lung apices. Other: None. IMPRESSION: 1. No evidence of acute intracranial abnormality. 2. No acute maxillofacial or cervical spine fracture. Electronically Signed   By: Sebastian Ache M.D.   On: 04/22/2023 19:23   CT Cervical Spine Wo Contrast  Result Date: 04/22/2023 CLINICAL DATA:  Head trauma, moderate-severe; Facial trauma, blunt; Neck trauma, dangerous injury mechanism (Age 47-64y). EXAM: CT HEAD WITHOUT CONTRAST CT MAXILLOFACIAL WITHOUT CONTRAST CT CERVICAL SPINE WITHOUT CONTRAST TECHNIQUE: Multidetector CT imaging of the head, cervical spine, and maxillofacial structures were  performed using the standard protocol without intravenous contrast. Multiplanar CT image reconstructions of the cervical spine and maxillofacial structures were also generated. RADIATION DOSE REDUCTION: This exam was performed according to the departmental dose-optimization program which includes automated exposure control, adjustment of the mA and/or kV according to patient size and/or use of iterative reconstruction technique. COMPARISON:  Head MRI 10/17/2020 FINDINGS: CT HEAD FINDINGS Brain: There is no evidence of an acute infarct, intracranial hemorrhage, mass, midline shift, or extra-axial fluid  collection. The ventricles and sulci are normal. Vascular: No hyperdense vessel. Skull: No fracture or suspicious osseous lesion. Other: None. CT MAXILLOFACIAL FINDINGS Osseous: No acute fracture or mandibular dislocation. Orbits: Unremarkable. Sinuses: Prior functional endoscopic sinus surgery with mild scattered mucosal thickening. No fluid level. Clear mastoid air cells and middle ear cavities. Soft tissues: Unremarkable. CT CERVICAL SPINE FINDINGS Alignment: Cervical spine straightening.  No significant listhesis. Skull base and vertebrae: No acute fracture or suspicious osseous lesion. Soft tissues and spinal canal: No prevertebral fluid or swelling. No visible canal hematoma. Disc levels: Mild spondylosis. Widespread facet arthrosis, most advanced in the mid upper cervical spine (particularly severe on the right at C4-5). Upper chest: Clear lung apices. Other: None. IMPRESSION: 1. No evidence of acute intracranial abnormality. 2. No acute maxillofacial or cervical spine fracture. Electronically Signed   By: Sebastian Ache M.D.   On: 04/22/2023 19:23   CT Head Wo Contrast  Result Date: 04/22/2023 CLINICAL DATA:  Head trauma, moderate-severe; Facial trauma, blunt; Neck trauma, dangerous injury mechanism (Age 25-64y). EXAM: CT HEAD WITHOUT CONTRAST CT MAXILLOFACIAL WITHOUT CONTRAST CT CERVICAL SPINE WITHOUT  CONTRAST TECHNIQUE: Multidetector CT imaging of the head, cervical spine, and maxillofacial structures were performed using the standard protocol without intravenous contrast. Multiplanar CT image reconstructions of the cervical spine and maxillofacial structures were also generated. RADIATION DOSE REDUCTION: This exam was performed according to the departmental dose-optimization program which includes automated exposure control, adjustment of the mA and/or kV according to patient size and/or use of iterative reconstruction technique. COMPARISON:  Head MRI 10/17/2020 FINDINGS: CT HEAD FINDINGS Brain: There is no evidence of an acute infarct, intracranial hemorrhage, mass, midline shift, or extra-axial fluid collection. The ventricles and sulci are normal. Vascular: No hyperdense vessel. Skull: No fracture or suspicious osseous lesion. Other: None. CT MAXILLOFACIAL FINDINGS Osseous: No acute fracture or mandibular dislocation. Orbits: Unremarkable. Sinuses: Prior functional endoscopic sinus surgery with mild scattered mucosal thickening. No fluid level. Clear mastoid air cells and middle ear cavities. Soft tissues: Unremarkable. CT CERVICAL SPINE FINDINGS Alignment: Cervical spine straightening.  No significant listhesis. Skull base and vertebrae: No acute fracture or suspicious osseous lesion. Soft tissues and spinal canal: No prevertebral fluid or swelling. No visible canal hematoma. Disc levels: Mild spondylosis. Widespread facet arthrosis, most advanced in the mid upper cervical spine (particularly severe on the right at C4-5). Upper chest: Clear lung apices. Other: None. IMPRESSION: 1. No evidence of acute intracranial abnormality. 2. No acute maxillofacial or cervical spine fracture. Electronically Signed   By: Sebastian Ache M.D.   On: 04/22/2023 19:23    Procedures Procedures    Medications Ordered in ED Medications - No data to display  ED Course/ Medical Decision Making/ A&P                                  Medical Decision Making Amount and/or Complexity of Data Reviewed Radiology: ordered.   Patient presents after a fall in her bathroom 2 days ago.  By her description she was experiencing some oversedation and incoordination from several psychiatric and sleep medications.  She had a hematoma on her forehead and periorbital ecchymoses.  She also endorsed midline C-spine tenderness but no paresthesia or weakness to suggest cord injury.  CT scans obtained for head face and neck without acute findings interpreted by radiology.  Knee x-ray interpreted by radiology arthritic changes but no acute findings.  At this time after  assessing patient, injury was 2 days ago and her mental status is clear.  She has no signs of neurologic compromise.  This appears to been secondary to oversedation by medications.  I have counseled the patient on taking significant care with her medications and reviewing this with her prescribing physicians.  We reviewed managing facial contusions at home with head of bed elevated to 30 degrees, no blowing the nose and ice packs as needed with Tylenol for pain.  Patient voices understanding.        Final Clinical Impression(s) / ED Diagnoses Final diagnoses:  Contusion of face, initial encounter  Fall, initial encounter  Strain of neck muscle, initial encounter  Contusion of right knee, initial encounter    Rx / DC Orders ED Discharge Orders     None         Arby Barrette, MD 04/22/23 2214

## 2023-04-22 NOTE — ED Triage Notes (Signed)
Pt reports fell  d/t loss of balance in bathroom at home on Mon; c/o facial, neck and RT knee pain; denies blood thinners

## 2023-04-23 DIAGNOSIS — S0033XA Contusion of nose, initial encounter: Secondary | ICD-10-CM | POA: Diagnosis not present

## 2023-04-30 ENCOUNTER — Encounter: Payer: Self-pay | Admitting: Neurology

## 2023-04-30 NOTE — Telephone Encounter (Signed)
Spoke to patient made her aware she needs a referral for a sleep consult at [facility] from PCP   Pt states she sees Dr Lucia Gaskins for chronic migraines . Made pt aware this is  a new issue and she needs another referral  for sleep study consult .Pt states will call PCP for referral and thanked me for calling

## 2023-05-08 ENCOUNTER — Encounter: Payer: Self-pay | Admitting: Family Medicine

## 2023-05-08 DIAGNOSIS — F3130 Bipolar disorder, current episode depressed, mild or moderate severity, unspecified: Secondary | ICD-10-CM | POA: Diagnosis not present

## 2023-05-12 DIAGNOSIS — M47816 Spondylosis without myelopathy or radiculopathy, lumbar region: Secondary | ICD-10-CM | POA: Diagnosis not present

## 2023-05-12 DIAGNOSIS — G894 Chronic pain syndrome: Secondary | ICD-10-CM | POA: Diagnosis not present

## 2023-05-12 DIAGNOSIS — M1711 Unilateral primary osteoarthritis, right knee: Secondary | ICD-10-CM | POA: Diagnosis not present

## 2023-05-12 DIAGNOSIS — M62830 Muscle spasm of back: Secondary | ICD-10-CM | POA: Diagnosis not present

## 2023-05-13 DIAGNOSIS — F3130 Bipolar disorder, current episode depressed, mild or moderate severity, unspecified: Secondary | ICD-10-CM | POA: Diagnosis not present

## 2023-05-19 NOTE — Telephone Encounter (Signed)
Spoke to pt made her aware  I can ask Dr Lucia Gaskins to see if she can give her a referral to one of our sleep MD here at [facility]  Informed pt usually the referral comes from PCP. 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 Pt wants some advice from dr Lucia Gaskins . Mom has appointment for formal memory testing 05/2024

## 2023-05-21 NOTE — Telephone Encounter (Signed)
Per Dr Lucia Gaskins please send referral to  [name] for [name] pt mother . Referral sent this afternoon  informed pt per Dr Lucia Gaskins  if [name]  thinks a sleep study is needed . [name] can send referral to [facility]  Pt expressed understanding and thanked me for calling

## 2023-05-23 ENCOUNTER — Ambulatory Visit
Admission: RE | Admit: 2023-05-23 | Discharge: 2023-05-23 | Disposition: A | Discharge status: Home / Self Care | Payer: Medicare HMO | Source: Ambulatory Visit | Attending: Family Medicine | Admitting: Family Medicine

## 2023-05-23 DIAGNOSIS — G8929 Other chronic pain: Secondary | ICD-10-CM

## 2023-05-23 DIAGNOSIS — M1711 Unilateral primary osteoarthritis, right knee: Secondary | ICD-10-CM | POA: Diagnosis not present

## 2023-05-23 DIAGNOSIS — M23341 Other meniscus derangements, anterior horn of lateral meniscus, right knee: Secondary | ICD-10-CM | POA: Diagnosis not present

## 2023-05-23 DIAGNOSIS — M25461 Effusion, right knee: Secondary | ICD-10-CM | POA: Diagnosis not present

## 2023-05-23 DIAGNOSIS — M25561 Pain in right knee: Secondary | ICD-10-CM | POA: Diagnosis not present

## 2023-05-27 DIAGNOSIS — F3130 Bipolar disorder, current episode depressed, mild or moderate severity, unspecified: Secondary | ICD-10-CM | POA: Diagnosis not present

## 2023-05-28 ENCOUNTER — Encounter: Payer: Self-pay | Admitting: Family Medicine

## 2023-06-08 NOTE — Telephone Encounter (Signed)
VOB initiated for ZILRETTA for RIGHT knee OA.

## 2023-06-09 ENCOUNTER — Encounter: Payer: Self-pay | Admitting: Hematology and Oncology

## 2023-06-09 NOTE — Telephone Encounter (Signed)
The policy #[id number] has been terminated as of 06/06/2023.   Please reach out to pt to update insurance information.   Thanks!

## 2023-06-09 NOTE — Telephone Encounter (Signed)
[employer] Medicare is active and in EPIC.

## 2023-06-09 NOTE — Telephone Encounter (Signed)
Insurance updated, VOB resubmitted

## 2023-06-10 NOTE — Telephone Encounter (Signed)
Specialty pharmacy fulfillment through [employer] portal.

## 2023-06-10 NOTE — Telephone Encounter (Signed)
Prior Authorization REQUIRED for Zilretta  A prior authorization is required for this patient's plan. [employer] can assist with the prior authorization. For Prior Auth support, please fax clinical information to [phone], or upload it using the online portal. No medical notes or referrals needed      BUY AND BILL  this specific [employer] Medicare Advantage (Group) plan is NOT allowed to provide coverage details for [id number] over the phone. Provider must check coverage details via [email or web address] or the [employer]. A prior authorization is required for this patient's plan. [employer] can assist with the prior authorization. For Prior Auth support, please fax clinical information to [phone], or upload it using the online portal. No medical notes or referrals needed. Patient has a Fully Funded, Medicare advantage POS plan with an effective date of 06/07/2023. Plan follows Medicare guidelines. Patient is responsible for a $10 copay for CPT code 65784 with the remaining covered at 100% by the payer at contracted rate. Deductibles do not apply to these services. Patient has a $10 copay whether or not an office visit is billed. Only one copay applies per date of service. Patient has an out of pocket maximum of $3600 and has accumulated $0. If out of pocket is met, copays will no longer apply. Plan renews on 07/08/2023. [employer] recommends that the provider resubmit for dates of service after 07/08/2023. Benefits for dates of service after 07/08/2023 may vary.    SPECIALTY PHARMACY  No prior authorization is required through the patient's prescription plan. Deductible does not apply to these services through the specialty pharmacy. Patient has a Pharmacy out of pocket max of $5030 and has met $1411.98. Patient has a $11.20 copay for the medication. The pharmacy will confirm the patient responsibility when the prescription is processed. This patient has the option to obtain  ZILRETTA at [facility]. If you would like to obtain the medication through [facility], please contact us at [phone].

## 2023-06-11 NOTE — Telephone Encounter (Signed)
Called pt and advise to expect a call from [facility] regarding payment for and delivery of Zilretta. Pt verbalized understanding.

## 2023-06-12 NOTE — Progress Notes (Signed)
MRI shows full-thickness areas of cartilage loss which is pretty bad arthritis and this is likely the source of your pain.  You do have some degeneration and tearing of the meniscus.  Unfortunately I think this may mean a knee replacement sooner than we were hoping.  You can either schedule an appointment with me to return to talk about the results of this MRI in full detail or I could refer you to an orthopedic surgeon to talk about surgery options.  Either would be appropriate.  Please let me know if you would like.

## 2023-07-09 NOTE — Telephone Encounter (Signed)
 VOB initiated for 2025 coverage of Zilretta.

## 2023-07-10 DIAGNOSIS — M1711 Unilateral primary osteoarthritis, right knee: Secondary | ICD-10-CM | POA: Diagnosis not present

## 2023-07-10 DIAGNOSIS — M47816 Spondylosis without myelopathy or radiculopathy, lumbar region: Secondary | ICD-10-CM | POA: Diagnosis not present

## 2023-07-10 DIAGNOSIS — G894 Chronic pain syndrome: Secondary | ICD-10-CM | POA: Diagnosis not present

## 2023-07-10 DIAGNOSIS — M62830 Muscle spasm of back: Secondary | ICD-10-CM | POA: Diagnosis not present

## 2023-07-13 ENCOUNTER — Encounter: Payer: Self-pay | Admitting: Hematology and Oncology

## 2023-07-13 NOTE — Telephone Encounter (Signed)
 Insurance updated from [place] to [employer]. VOB re-submitted.

## 2023-07-14 NOTE — Telephone Encounter (Signed)
 [employer] Stable - Prior Authorization REQUIRED  P[employer] Monia Pouch - Prior Authorization NOT required

## 2023-07-14 NOTE — Addendum Note (Signed)
 Addended by: Dierdre Searles on: 07/14/2023 10:41 AM   Modules accepted: Orders

## 2023-07-17 DIAGNOSIS — F4320 Adjustment disorder, unspecified: Secondary | ICD-10-CM | POA: Diagnosis not present

## 2023-07-23 NOTE — Telephone Encounter (Signed)
VOB initiated for Durolane for RIGHT knee OA.

## 2023-07-24 NOTE — Telephone Encounter (Signed)
 [employer] - Prior Authorization NOT required

## 2023-07-24 NOTE — Telephone Encounter (Signed)
DUROLANE for RIGHT knee OA   Primary Insurance: [employer] Medicare Adv PPO Co-pay: $30 Co-insurance: 20% Deductible: does not apply Prior Auth: NOT required   Knee Injection History 01/16/21 - Kenalog RIGHT 02/28/21 - Orthovisc #1 RIGHT 03/06/21 - Orthovisc #2 RIGHT 03/13/21 - Orthovisc #3 RIGHT 03/05/23 - Kenalog RIGHT 03/23/23 - Zilretta RIGHT

## 2023-07-28 DIAGNOSIS — F3130 Bipolar disorder, current episode depressed, mild or moderate severity, unspecified: Secondary | ICD-10-CM | POA: Diagnosis not present

## 2023-07-31 ENCOUNTER — Other Ambulatory Visit: Payer: Self-pay

## 2023-08-04 NOTE — Telephone Encounter (Signed)
Needs Medical [place] and [place] PA

## 2023-08-05 DIAGNOSIS — J4541 Moderate persistent asthma with (acute) exacerbation: Secondary | ICD-10-CM | POA: Diagnosis not present

## 2023-08-07 DIAGNOSIS — F3130 Bipolar disorder, current episode depressed, mild or moderate severity, unspecified: Secondary | ICD-10-CM | POA: Diagnosis not present

## 2023-08-07 DIAGNOSIS — L920 Granuloma annulare: Secondary | ICD-10-CM | POA: Diagnosis not present

## 2023-08-11 NOTE — Telephone Encounter (Addendum)
Medical Buy and Bill  Prior Authorization initiated for [facility] via [employer] Case ID: [id number]

## 2023-08-12 ENCOUNTER — Ambulatory Visit (INDEPENDENT_AMBULATORY_CARE_PROVIDER_SITE_OTHER): Payer: Medicare HMO | Admitting: Physician Assistant

## 2023-08-12 ENCOUNTER — Encounter: Payer: Self-pay | Admitting: Physician Assistant

## 2023-08-12 VITALS — BP 100/70 | HR 90 | Temp 96.8°F | Resp 18 | Ht 67.0 in | Wt 185.0 lb

## 2023-08-12 DIAGNOSIS — F411 Generalized anxiety disorder: Secondary | ICD-10-CM

## 2023-08-12 DIAGNOSIS — K219 Gastro-esophageal reflux disease without esophagitis: Secondary | ICD-10-CM | POA: Diagnosis not present

## 2023-08-12 DIAGNOSIS — F3177 Bipolar disorder, in partial remission, most recent episode mixed: Secondary | ICD-10-CM

## 2023-08-12 DIAGNOSIS — J4540 Moderate persistent asthma, uncomplicated: Secondary | ICD-10-CM

## 2023-08-12 DIAGNOSIS — L920 Granuloma annulare: Secondary | ICD-10-CM | POA: Diagnosis not present

## 2023-08-12 DIAGNOSIS — F410 Panic disorder [episodic paroxysmal anxiety] without agoraphobia: Secondary | ICD-10-CM | POA: Diagnosis not present

## 2023-08-12 DIAGNOSIS — G43711 Chronic migraine without aura, intractable, with status migrainosus: Secondary | ICD-10-CM

## 2023-08-12 DIAGNOSIS — M545 Low back pain, unspecified: Secondary | ICD-10-CM

## 2023-08-12 DIAGNOSIS — G8929 Other chronic pain: Secondary | ICD-10-CM | POA: Diagnosis not present

## 2023-08-12 DIAGNOSIS — F5101 Primary insomnia: Secondary | ICD-10-CM | POA: Diagnosis not present

## 2023-08-12 DIAGNOSIS — K5909 Other constipation: Secondary | ICD-10-CM | POA: Diagnosis not present

## 2023-08-12 MED ORDER — FENTANYL 25 MCG/HR TD PT72: 1.0000 | MEDICATED_PATCH | TRANSDERMAL | Status: DC
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
## 2023-08-13 DIAGNOSIS — M62830 Muscle spasm of back: Secondary | ICD-10-CM | POA: Diagnosis not present

## 2023-08-13 DIAGNOSIS — M1711 Unilateral primary osteoarthritis, right knee: Secondary | ICD-10-CM | POA: Diagnosis not present

## 2023-08-13 DIAGNOSIS — Z79891 Long term (current) use of opiate analgesic: Secondary | ICD-10-CM | POA: Diagnosis not present

## 2023-08-13 DIAGNOSIS — G894 Chronic pain syndrome: Secondary | ICD-10-CM | POA: Diagnosis not present

## 2023-08-13 DIAGNOSIS — M47816 Spondylosis without myelopathy or radiculopathy, lumbar region: Secondary | ICD-10-CM | POA: Diagnosis not present

## 2023-08-17 DIAGNOSIS — G894 Chronic pain syndrome: Secondary | ICD-10-CM | POA: Diagnosis not present

## 2023-08-17 DIAGNOSIS — Z79891 Long term (current) use of opiate analgesic: Secondary | ICD-10-CM | POA: Diagnosis not present

## 2023-08-18 NOTE — Telephone Encounter (Signed)
Holding until needed by patient.

## 2023-08-18 NOTE — Telephone Encounter (Signed)
Medical Buy and [place] Stable  Prior Auth for [place] APPROVED PA# [id number]  Valid: 08/11/23-11/08/23

## 2023-08-18 NOTE — Telephone Encounter (Signed)
ZILRETTA for RIGHT knee OA   Medical Buy and Bill  Primary Insurance: [employer] Medicare Adv PPO Co-pay: $30 Co-insurance: 20% Deductible: does not apply Prior Auth: APPROVED PA# [id number]  Valid: 08/11/23-11/08/23  Pharmacy Benefit  PBM: [employer] Co-pay: $12.15 Co-insurance: undetermined Deductible: undetermined Prior Auth: NOT required   Knee Injection History 01/16/21 - Kenalog RIGHT 02/28/21 - Orthovisc #1 RIGHT 03/06/21 - Orthovisc #2 RIGHT 03/13/21 - Orthovisc #3 RIGHT 03/05/23 - Kenalog RIGHT 03/23/23 - Zilretta RIGHT

## 2023-08-25 DIAGNOSIS — F3130 Bipolar disorder, current episode depressed, mild or moderate severity, unspecified: Secondary | ICD-10-CM | POA: Diagnosis not present

## 2023-08-31 ENCOUNTER — Telehealth: Payer: Self-pay

## 2023-08-31 ENCOUNTER — Telehealth: Payer: Self-pay | Admitting: Neurology

## 2023-08-31 ENCOUNTER — Other Ambulatory Visit (HOSPITAL_COMMUNITY): Payer: Self-pay

## 2023-08-31 ENCOUNTER — Encounter: Payer: Self-pay | Admitting: Hematology and Oncology

## 2023-08-31 DIAGNOSIS — L920 Granuloma annulare: Secondary | ICD-10-CM | POA: Diagnosis not present

## 2023-08-31 NOTE — Telephone Encounter (Signed)
 Pharmacy Patient Advocate Encounter   Received notification from Pt Calls Messages that prior authorization for AJOVY (fremanezumab-vfrm) injection 225MG /1.5ML syringes is required/requested.   Insurance verification completed.   The patient is insured through [facility] Medicare .   Per test claim: PA required; PA submitted to above mentioned insurance via CoverMyMeds Key/confirmation #/EOC [id number] Status is pending

## 2023-08-31 NOTE — Telephone Encounter (Signed)
 Patient lvm that her pharmacy needs a new PA for the Ajovy

## 2023-09-02 NOTE — Telephone Encounter (Signed)
 Pharmacy Patient Advocate Encounter  Received notification from [facility] that Prior Authorization for AJOVY (fremanezumab-vfrm) injection 225MG /1.5ML syringes  has been APPROVED from 08/31/2023 to 07/06/2024   PA #/Case ID/Reference #: [id number]

## 2023-09-08 DIAGNOSIS — K5903 Drug induced constipation: Secondary | ICD-10-CM | POA: Diagnosis not present

## 2023-09-08 DIAGNOSIS — R12 Heartburn: Secondary | ICD-10-CM | POA: Diagnosis not present

## 2023-09-08 DIAGNOSIS — Z79899 Other long term (current) drug therapy: Secondary | ICD-10-CM | POA: Diagnosis not present

## 2023-09-08 DIAGNOSIS — R591 Generalized enlarged lymph nodes: Secondary | ICD-10-CM | POA: Diagnosis not present

## 2023-09-08 DIAGNOSIS — K449 Diaphragmatic hernia without obstruction or gangrene: Secondary | ICD-10-CM | POA: Diagnosis not present

## 2023-09-08 DIAGNOSIS — Z7951 Long term (current) use of inhaled steroids: Secondary | ICD-10-CM | POA: Diagnosis not present

## 2023-09-08 DIAGNOSIS — G44219 Episodic tension-type headache, not intractable: Secondary | ICD-10-CM | POA: Diagnosis not present

## 2023-09-08 DIAGNOSIS — R232 Flushing: Secondary | ICD-10-CM | POA: Diagnosis not present

## 2023-09-08 DIAGNOSIS — K219 Gastro-esophageal reflux disease without esophagitis: Secondary | ICD-10-CM | POA: Diagnosis not present

## 2023-09-08 DIAGNOSIS — Z860100 Personal history of colon polyps, unspecified: Secondary | ICD-10-CM | POA: Diagnosis not present

## 2023-09-08 DIAGNOSIS — Z1211 Encounter for screening for malignant neoplasm of colon: Secondary | ICD-10-CM | POA: Diagnosis not present

## 2023-09-08 DIAGNOSIS — J45909 Unspecified asthma, uncomplicated: Secondary | ICD-10-CM | POA: Diagnosis not present

## 2023-09-09 DIAGNOSIS — F3130 Bipolar disorder, current episode depressed, mild or moderate severity, unspecified: Secondary | ICD-10-CM | POA: Diagnosis not present

## 2023-09-10 DIAGNOSIS — M1711 Unilateral primary osteoarthritis, right knee: Secondary | ICD-10-CM | POA: Diagnosis not present

## 2023-09-10 DIAGNOSIS — G894 Chronic pain syndrome: Secondary | ICD-10-CM | POA: Diagnosis not present

## 2023-09-10 DIAGNOSIS — M47816 Spondylosis without myelopathy or radiculopathy, lumbar region: Secondary | ICD-10-CM | POA: Diagnosis not present

## 2023-09-10 DIAGNOSIS — M62830 Muscle spasm of back: Secondary | ICD-10-CM | POA: Diagnosis not present

## 2023-09-11 ENCOUNTER — Encounter: Payer: Self-pay | Admitting: Hematology and Oncology

## 2023-09-16 DIAGNOSIS — F3130 Bipolar disorder, current episode depressed, mild or moderate severity, unspecified: Secondary | ICD-10-CM | POA: Diagnosis not present

## 2023-09-18 ENCOUNTER — Telehealth: Payer: Medicare HMO | Admitting: Adult Health

## 2023-09-18 DIAGNOSIS — G43709 Chronic migraine without aura, not intractable, without status migrainosus: Secondary | ICD-10-CM

## 2023-09-18 MED ORDER — NURTEC 75 MG PO TBDP: ORAL_TABLET | ORAL | 11 refills | Status: DC
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
## 2023-09-18 NOTE — Patient Instructions (Addendum)
 Your Plan:  Continue Ajovy monthly injections Stop Maxalt Start Nurtec 75 mg tablet.  Take 1 tablet at the onset of a migraine.  Only 1 tablet in 24 hours.  Thank you for coming to see [place] at [facility]. I hope we have been able to provide you high quality care today.  You may receive a patient satisfaction survey over the next few weeks. We would appreciate your feedback and comments so that we may continue to improve ourselves and the health of our patients.

## 2023-09-23 DIAGNOSIS — F3130 Bipolar disorder, current episode depressed, mild or moderate severity, unspecified: Secondary | ICD-10-CM | POA: Diagnosis not present

## 2023-09-30 DIAGNOSIS — Z124 Encounter for screening for malignant neoplasm of cervix: Secondary | ICD-10-CM | POA: Diagnosis not present

## 2023-09-30 DIAGNOSIS — N951 Menopausal and female climacteric states: Secondary | ICD-10-CM | POA: Diagnosis not present

## 2023-09-30 DIAGNOSIS — Z01419 Encounter for gynecological examination (general) (routine) without abnormal findings: Secondary | ICD-10-CM | POA: Diagnosis not present

## 2023-09-30 DIAGNOSIS — Z113 Encounter for screening for infections with a predominantly sexual mode of transmission: Secondary | ICD-10-CM | POA: Diagnosis not present

## 2023-09-30 DIAGNOSIS — Z01411 Encounter for gynecological examination (general) (routine) with abnormal findings: Secondary | ICD-10-CM | POA: Diagnosis not present

## 2023-09-30 DIAGNOSIS — Z1331 Encounter for screening for depression: Secondary | ICD-10-CM | POA: Diagnosis not present

## 2023-09-30 DIAGNOSIS — Z1231 Encounter for screening mammogram for malignant neoplasm of breast: Secondary | ICD-10-CM | POA: Diagnosis not present

## 2023-10-07 DIAGNOSIS — M47816 Spondylosis without myelopathy or radiculopathy, lumbar region: Secondary | ICD-10-CM | POA: Diagnosis not present

## 2023-10-07 DIAGNOSIS — G894 Chronic pain syndrome: Secondary | ICD-10-CM | POA: Diagnosis not present

## 2023-10-07 DIAGNOSIS — M1711 Unilateral primary osteoarthritis, right knee: Secondary | ICD-10-CM | POA: Diagnosis not present

## 2023-10-07 DIAGNOSIS — M62830 Muscle spasm of back: Secondary | ICD-10-CM | POA: Diagnosis not present

## 2023-10-07 LAB — HM MAMMOGRAPHY

## 2023-10-09 DIAGNOSIS — F3130 Bipolar disorder, current episode depressed, mild or moderate severity, unspecified: Secondary | ICD-10-CM | POA: Diagnosis not present

## 2023-10-16 DIAGNOSIS — R92322 Mammographic fibroglandular density, left breast: Secondary | ICD-10-CM | POA: Diagnosis not present

## 2023-10-16 DIAGNOSIS — R922 Inconclusive mammogram: Secondary | ICD-10-CM | POA: Diagnosis not present

## 2023-10-16 DIAGNOSIS — R928 Other abnormal and inconclusive findings on diagnostic imaging of breast: Secondary | ICD-10-CM | POA: Diagnosis not present

## 2023-10-19 ENCOUNTER — Inpatient Hospital Stay: Payer: Medicare HMO | Attending: Hematology and Oncology

## 2023-10-19 ENCOUNTER — Inpatient Hospital Stay (HOSPITAL_BASED_OUTPATIENT_CLINIC_OR_DEPARTMENT_OTHER): Payer: Medicare HMO | Admitting: Hematology and Oncology

## 2023-10-19 ENCOUNTER — Telehealth: Payer: Self-pay | Admitting: *Deleted

## 2023-10-19 ENCOUNTER — Other Ambulatory Visit: Payer: Self-pay | Admitting: Hematology and Oncology

## 2023-10-19 VITALS — BP 142/85 | HR 89 | Temp 98.7°F | Resp 16 | Wt 195.0 lb

## 2023-10-19 DIAGNOSIS — D508 Other iron deficiency anemias: Secondary | ICD-10-CM

## 2023-10-19 DIAGNOSIS — E611 Iron deficiency: Secondary | ICD-10-CM | POA: Diagnosis not present

## 2023-10-19 DIAGNOSIS — Z9884 Bariatric surgery status: Secondary | ICD-10-CM | POA: Diagnosis not present

## 2023-10-19 DIAGNOSIS — F3175 Bipolar disorder, in partial remission, most recent episode depressed: Secondary | ICD-10-CM | POA: Diagnosis not present

## 2023-10-19 LAB — CMP (CANCER CENTER ONLY)
ALT: 21 U/L (ref 0–44)
AST: 23 U/L (ref 15–41)
Albumin: 4.6 g/dL (ref 3.5–5.0)
Alkaline Phosphatase: 77 U/L (ref 38–126)
Anion gap: 5 (ref 5–15)
BUN: 14 mg/dL (ref 6–20)
CO2: 32 mmol/L (ref 22–32)
Calcium: 9.6 mg/dL (ref 8.9–10.3)
Chloride: 103 mmol/L (ref 98–111)
Creatinine: 0.85 mg/dL (ref 0.44–1.00)
GFR, Estimated: >60 mL/min (ref >60)
Glucose, Bld: 88 mg/dL (ref 70–99)
Potassium: 4.1 mmol/L (ref 3.5–5.1)
Sodium: 140 mmol/L (ref 135–145)
Total Bilirubin: 0.6 mg/dL (ref 0.0–1.2)
Total Protein: 7.6 g/dL (ref 6.5–8.1)

## 2023-10-19 LAB — IRON AND IRON BINDING CAPACITY (CC-WL,HP ONLY)
Iron: 94 ug/dL (ref 28–170)
Saturation Ratios: 21 % (ref 10.4–31.8)
TIBC: 455 ug/dL — ABNORMAL HIGH (ref 250–450)
UIBC: 361 ug/dL (ref 148–442)

## 2023-10-19 LAB — CBC WITH DIFFERENTIAL (CANCER CENTER ONLY)
Abs Immature Granulocytes: 0.04 10*3/uL (ref 0.00–0.07)
Basophils Absolute: 0.1 10*3/uL (ref 0.0–0.1)
Basophils Relative: 1 %
Eosinophils Absolute: 0.9 10*3/uL — ABNORMAL HIGH (ref 0.0–0.5)
Eosinophils Relative: 7 %
HCT: 41.6 % (ref 36.0–46.0)
Hemoglobin: 13.4 g/dL (ref 12.0–15.0)
Immature Granulocytes: 0 %
Lymphocytes Relative: 10 %
Lymphs Abs: 1.3 10*3/uL (ref 0.7–4.0)
MCH: 29.1 pg (ref 26.0–34.0)
MCHC: 32.2 g/dL (ref 30.0–36.0)
MCV: 90.2 fL (ref 80.0–100.0)
Monocytes Absolute: 1.2 10*3/uL — ABNORMAL HIGH (ref 0.1–1.0)
Monocytes Relative: 9 %
Neutro Abs: 9.1 10*3/uL — ABNORMAL HIGH (ref 1.7–7.7)
Neutrophils Relative %: 73 %
Platelet Count: 239 10*3/uL (ref 150–400)
RBC: 4.61 MIL/uL (ref 3.87–5.11)
RDW: 12.8 % (ref 11.5–15.5)
WBC Count: 12.6 10*3/uL — ABNORMAL HIGH (ref 4.0–10.5)
nRBC: 0 % (ref 0.0–0.2)

## 2023-10-19 LAB — RETIC PANEL
Immature Retic Fract: 13.3 % (ref 2.3–15.9)
RBC.: 4.58 MIL/uL (ref 3.87–5.11)
Retic Count, Absolute: 34.8 10*3/uL (ref 19.0–186.0)
Retic Ct Pct: 0.8 % (ref 0.4–3.1)
Reticulocyte Hemoglobin: 32.3 pg (ref >27.9)

## 2023-10-19 LAB — FERRITIN: Ferritin: 14 ng/mL (ref 11–307)

## 2023-10-19 NOTE — Telephone Encounter (Signed)
 TCT patient. No answer and unable to leave vm message as her vm is full.[name]

## 2023-10-19 NOTE — Telephone Encounter (Signed)
-----   Message from Rogerio Clay IV sent at 10/19/2023  4:52 PM EDT ----- Please let [name] know that her ferritin levels have fallen to 14.  Our recommendation is try to keep those levels above 30.  If she is upward I would recommend another round of IV iron therapy.  We will plan to see her back approximate 4 to 6 weeks after her last dose of IV iron in order to ensure her levels are adequately repleted. ----- Message ----- From: Dannis Dy, Lab In [place] Sent: 10/19/2023  10:36 AM EDT To: Ander Bame, MD
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
## 2023-10-21 ENCOUNTER — Encounter (HOSPITAL_COMMUNITY): Payer: Self-pay | Admitting: *Deleted

## 2023-10-21 DIAGNOSIS — F3175 Bipolar disorder, in partial remission, most recent episode depressed: Secondary | ICD-10-CM | POA: Diagnosis not present

## 2023-10-22 DIAGNOSIS — G5601 Carpal tunnel syndrome, right upper limb: Secondary | ICD-10-CM | POA: Diagnosis not present

## 2023-10-28 ENCOUNTER — Ambulatory Visit: Payer: Medicare HMO | Admitting: Physician Assistant

## 2023-10-28 ENCOUNTER — Ambulatory Visit (INDEPENDENT_AMBULATORY_CARE_PROVIDER_SITE_OTHER)

## 2023-10-28 ENCOUNTER — Ambulatory Visit

## 2023-10-28 VITALS — BP 130/82 | HR 80 | Resp 16 | Ht 67.0 in | Wt 193.4 lb

## 2023-10-28 DIAGNOSIS — Z9189 Other specified personal risk factors, not elsewhere classified: Secondary | ICD-10-CM

## 2023-10-28 DIAGNOSIS — Z0000 Encounter for general adult medical examination without abnormal findings: Secondary | ICD-10-CM | POA: Diagnosis not present

## 2023-10-28 DIAGNOSIS — N959 Unspecified menopausal and perimenopausal disorder: Secondary | ICD-10-CM | POA: Diagnosis not present

## 2023-10-28 NOTE — Patient Instructions (Signed)
 [name] , Thank you for taking time to come for your Medicare Wellness Visit. I appreciate your ongoing commitment to your health goals. Please review the following plan we discussed and let me know if I can assist you in the future.    Goals      Weight (lb) < 165 lb (74.8 kg)        This is a list of the screening recommended for you and due dates:  Health Maintenance  Topic Date Due   Pneumococcal Vaccination (1 of 2 - PCV) Never done   Hepatitis C Screening  08/11/2024*   Flu Shot  02/05/2024   Pap with HPV screening  05/15/2024   Mammogram  10/06/2024   Medicare Annual Wellness Visit  10/27/2024   Colon Cancer Screening  09/07/2028   DTaP/Tdap/Td vaccine (4 - Td or Tdap) 05/15/2029   HIV Screening  Completed   Zoster (Shingles) Vaccine  Completed   HPV Vaccine  Aged Out   Meningitis B Vaccine  Aged Out   COVID-19 Vaccine  Discontinued   Cologuard (Stool DNA test)  Discontinued  *Topic was postponed. The date shown is not the original due date.    Preventive Care 40-64 Years, Female Preventive care refers to lifestyle choices and visits with your health care provider that can promote health and wellness. What does preventive care include? A yearly physical exam. This is also called an annual well check. Dental exams once or twice a year. Routine eye exams. Ask your health care provider how often you should have your eyes checked. Personal lifestyle choices, including: Daily care of your teeth and gums. Regular physical activity. Eating a healthy diet. Avoiding tobacco and drug use. Limiting alcohol use. Practicing safe sex. Taking low-dose aspirin daily starting at age 51. Taking vitamin and mineral supplements as recommended by your health care provider. What happens during an annual well check? The services and screenings done by your health care provider during your annual well check will depend on your age, overall health, lifestyle risk factors, and family  history of disease. Counseling  Your health care provider may ask you questions about your: Alcohol use. Tobacco use. Drug use. Emotional well-being. Home and relationship well-being. Sexual activity. Eating habits. Work and work Astronomer. Method of birth control. Menstrual cycle. Pregnancy history. Screening  You may have the following tests or measurements: Height, weight, and BMI. Blood pressure. Lipid and cholesterol levels. These may be checked every 5 years, or more frequently if you are over 58 years old. Skin check. Lung cancer screening. You may have this screening every year starting at age 4 if you have a 30-pack-year history of smoking and currently smoke or have quit within the past 15 years. Fecal occult blood test (FOBT) of the stool. You may have this test every year starting at age 18. Flexible sigmoidoscopy or colonoscopy. You may have a sigmoidoscopy every 5 years or a colonoscopy every 10 years starting at age 35. Hepatitis C blood test. Hepatitis B blood test. Sexually transmitted disease (STD) testing. Diabetes screening. This is done by checking your blood sugar (glucose) after you have not eaten for a while (fasting). You may have this done every 1-3 years. Mammogram. This may be done every 1-2 years. Talk to your health care provider about when you should start having regular mammograms. This may depend on whether you have a family history of breast cancer. BRCA-related cancer screening. This may be done if you have a family history of  breast, ovarian, tubal, or peritoneal cancers. Pelvic exam and Pap test. This may be done every 3 years starting at age 64. Starting at age 60, this may be done every 5 years if you have a Pap test in combination with an HPV test. Bone density scan. This is done to screen for osteoporosis. You may have this scan if you are at high risk for osteoporosis. Discuss your test results, treatment options, and if necessary, the need  for more tests with your health care provider. Vaccines  Your health care provider may recommend certain vaccines, such as: Influenza vaccine. This is recommended every year. Tetanus, diphtheria, and acellular pertussis (Tdap, Td) vaccine. You may need a Td booster every 10 years. Zoster vaccine. You may need this after age 62. Pneumococcal 13-valent conjugate (PCV13) vaccine. You may need this if you have certain conditions and were not previously vaccinated. Pneumococcal polysaccharide (PPSV23) vaccine. You may need one or two doses if you smoke cigarettes or if you have certain conditions. Talk to your health care provider about which screenings and vaccines you need and how often you need them. This information is not intended to replace advice given to you by your health care provider. Make sure you discuss any questions you have with your health care provider. Document Released: 07/20/2015 Document Revised: 03/12/2016 Document Reviewed: 04/24/2015 Elsevier Interactive Patient Education  2017 [employer].    Fall Prevention in the Home Falls can cause injuries. They can happen to people of all ages. There are many things you can do to make your home safe and to help prevent falls. What can I do on the outside of my home? Regularly fix the edges of walkways and driveways and fix any cracks. Remove anything that might make you trip as you walk through a door, such as a raised step or threshold. Trim any bushes or trees on the path to your home. Use bright outdoor lighting. Clear any walking paths of anything that might make someone trip, such as rocks or tools. Regularly check to see if handrails are loose or broken. Make sure that both sides of any steps have handrails. Any raised decks and porches should have guardrails on the edges. Have any leaves, snow, or ice cleared regularly. Use sand or salt on walking paths during winter. Clean up any spills in your garage right away. This  includes oil or grease spills. What can I do in the bathroom? Use night lights. Install grab bars by the toilet and in the tub and shower. Do not use towel bars as grab bars. Use non-skid mats or decals in the tub or shower. If you need to sit down in the shower, use a plastic, non-slip stool. Keep the floor dry. Clean up any water  that spills on the floor as soon as it happens. Remove soap buildup in the tub or shower regularly. Attach bath mats securely with double-sided non-slip rug tape. Do not have throw rugs and other things on the floor that can make you trip. What can I do in the bedroom? Use night lights. Make sure that you have a light by your bed that is easy to reach. Do not use any sheets or blankets that are too big for your bed. They should not hang down onto the floor. Have a firm chair that has side arms. You can use this for support while you get dressed. Do not have throw rugs and other things on the floor that can make you trip. What  can I do in the kitchen? Clean up any spills right away. Avoid walking on wet floors. Keep items that you use a lot in easy-to-reach places. If you need to reach something above you, use a strong step stool that has a grab bar. Keep electrical cords out of the way. Do not use floor polish or wax that makes floors slippery. If you must use wax, use non-skid floor wax. Do not have throw rugs and other things on the floor that can make you trip. What can I do with my stairs? Do not leave any items on the stairs. Make sure that there are handrails on both sides of the stairs and use them. Fix handrails that are broken or loose. Make sure that handrails are as long as the stairways. Check any carpeting to make sure that it is firmly attached to the stairs. Fix any carpet that is loose or worn. Avoid having throw rugs at the top or bottom of the stairs. If you do have throw rugs, attach them to the floor with carpet tape. Make sure that you  have a light switch at the top of the stairs and the bottom of the stairs. If you do not have them, ask someone to add them for you. What else can I do to help prevent falls? Wear shoes that: Do not have high heels. Have rubber bottoms. Are comfortable and fit you well. Are closed at the toe. Do not wear sandals. If you use a stepladder: Make sure that it is fully opened. Do not climb a closed stepladder. Make sure that both sides of the stepladder are locked into place. Ask someone to hold it for you, if possible. Clearly mark and make sure that you can see: Any grab bars or handrails. First and last steps. Where the edge of each step is. Use tools that help you move around (mobility aids) if they are needed. These include: Canes. Walkers. Scooters. Crutches. Turn on the lights when you go into a dark area. Replace any light bulbs as soon as they burn out. Set up your furniture so you have a clear path. Avoid moving your furniture around. If any of your floors are uneven, fix them. If there are any pets around you, be aware of where they are. Review your medicines with your doctor. Some medicines can make you feel dizzy. This can increase your chance of falling. Ask your doctor what other things that you can do to help prevent falls. This information is not intended to replace advice given to you by your health care provider. Make sure you discuss any questions you have with your health care provider. Document Released: 04/19/2009 Document Revised: 11/29/2015 Document Reviewed: 07/28/2014 Elsevier Interactive Patient Education  2017 [employer].

## 2023-10-28 NOTE — Progress Notes (Signed)
 Subjective:   [name] is a 58 y.o. female who presents for Medicare Annual (Subsequent) preventive examination.  This wellness visit is conducted by a nurse.  The patient's medications were reviewed and reconciled since the patient's last visit.  History details were provided by the patient.  The history appears to be reliable.    Medical History: Patient history and Family history was reviewed  Medications, Allergies, and preventative health maintenance was reviewed and updated.  Visit Complete: In person  Patient sees pain management and psych for treatment and medication management.      Objective:    Today's Vitals   10/28/23 1021  BP: 130/82  Pulse: 80  Resp: 16  SpO2: 97%  Weight: 193 lb 6.4 oz (87.7 kg)  Height: 5\' 7"  (1.702 m)  PainSc: 6   PainLoc: Generalized   Body mass index is 30.29 kg/m.     04/22/2023    4:02 PM 11/14/2022    1:33 PM 01/27/2020    8:28 AM 01/20/2020    9:11 AM 02/28/2019   10:07 AM 07/14/2018    7:50 AM 04/13/2017    9:03 AM  Advanced Directives  Does Patient Have a Medical Advance Directive? No No No No No No No  Would patient like information on creating a medical advance directive?   No - Patient declined No - Patient declined No - Patient declined Yes (ED - Information included in AVS) Yes (MAU/Ambulatory/Procedural Areas - Information given)    Current Medications (verified) Outpatient Encounter Medications as of 10/28/2023  Medication Sig   albuterol  (VENTOLIN  HFA) 108 (90 Base) MCG/ACT inhaler Inhale 2 puffs into the lungs every 4 (four) hours as needed for wheezing or shortness of breath.   ALPRAZolam (XANAX XR) 1 MG 24 hr tablet Take 1 mg by mouth 2 (two) times daily.   ALPRAZolam (XANAX) 1 MG tablet Take 1 mg by mouth every morning.   Biotin 2500 MCG CAPS Take by mouth daily in the afternoon.   buPROPion (WELLBUTRIN XL) 150 MG 24 hr tablet Take 450 mg by mouth daily.   Docusate Sodium (DSS) 100 MG CAPS Take by mouth.    EPINEPHrine  0.3 mg/0.3 mL IJ SOAJ injection Inject into the muscle.   escitalopram  (LEXAPRO ) 20 MG tablet Take 1 tablet (20 mg total) by mouth daily with breakfast.   estradiol  (ESTRACE ) 1 MG tablet    fentaNYL  (DURAGESIC ) 25 MCG/HR Place 1 patch onto the skin every 3 (three) days.   FIBER PO Take 5 mg by mouth daily.   fluconazole  (DIFLUCAN ) 100 MG tablet Take 2 tablets together x 1 day, then 1 tablet daily.   Fluticasone -Umeclidin-Vilant (TRELEGY ELLIPTA) 200-62.5-25 MCG/ACT AEPB Inhale into the lungs.   Fremanezumab -vfrm (AJOVY ) 225 MG/1.5ML SOAJ Inject 225 mg into the skin every 30 (thirty) days.   HYDROmorphone  (DILAUDID ) 2 MG tablet Take 1 tablet (2 mg total) by mouth every 6 (six) hours.   lactulose, encephalopathy, (CHRONULAC) 10 GM/15ML SOLN 15 ML BY MOUTH ONCE A DAY AS NEEDED   lamoTRIgine (LAMICTAL) 200 MG tablet Take 200 mg by mouth daily.   lidocaine  (XYLOCAINE ) 2 % jelly Apply 1 application topically as needed.   linaclotide  (LINZESS ) 72 MCG capsule Linzess    medroxyPROGESTERone  (PROVERA ) 2.5 MG tablet Take 1 tablet (2.5 mg total) by mouth daily.   Melatonin 10 MG CAPS Take by mouth at bedtime.   methylphenidate 27 MG PO CR tablet Take 27 mg by mouth every morning.   Multiple Vitamins-Minerals (MULTIVITAMIN  GUMMIES WOMENS PO) Take by mouth daily in the afternoon.   naloxone (NARCAN) nasal spray 4 mg/0.1 mL SMARTSIG:Both Nares   pantoprazole  (PROTONIX ) 40 MG tablet Take 1 tablet (40 mg total) by mouth daily. Office visit for further refills   propranolol (INDERAL) 20 MG tablet Take 20 mg by mouth 3 (three) times daily.   Rimegepant Sulfate (NURTEC) 75 MG TBDP Take 1 tablet at the onset of a migraine.  Only 1 tablet in 24 hours   Sennosides (SENOKOT PO) Take 2 tablets by mouth at bedtime. 2 tablets at night    temazepam (RESTORIL) 30 MG capsule Take 30 mg by mouth at bedtime as needed.   tiZANidine (ZANAFLEX) 2 MG tablet Take 2 mg by mouth 3 (three) times daily as needed.    topiramate (TOPAMAX) 50 MG tablet Take 50 mg by mouth 2 (two) times daily.   traZODone  (DESYREL ) 100 MG tablet Take 300 mg by mouth at bedtime.   triamcinolone  cream (KENALOG ) 0.1 % APPLY 1 APPLICATION ON THE SKIN AS DIRECTED APPLY 1-2X PER DAY TO ELBOWS   valACYclovir  (VALTREX ) 1000 MG tablet Take 1 tablet by mouth daily.   famotidine (PEPCID) 40 MG tablet Take 1 tablet by mouth daily.   No facility-administered encounter medications on file as of 10/28/2023.    Allergies (verified) Carbamazepine, Duloxetine, Erythromycin, Erythromycin base, Gabapentin, Latex, Pineapple, Pregabalin, Shellfish-derived products, Sulfur, Bee pollen, Pregabalin, Other, Pollen extract, and Peanut (diagnostic)   History: Past Medical History:  Diagnosis Date   Allergy    Anxiety    Arthritis    Asthma    History of Asthma   Bipolar disorder (HCC)    Chest pain, atypical 11/30/2012   Clotting disorder (HCC)    Depression    Diabetes mellitus without complication (HCC)    type 2, pre-diabetic before she lost weight   GERD (gastroesophageal reflux disease)    H/O degenerative disc disease    L4-L5, L5-S1   Heart murmur    Hyperglycemia    Postoperative hyperglycemia   Hypertension    hx of   Neuromuscular disorder (HCC)    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   Obesity 07/14/2012   OSA (obstructive sleep apnea)    mild   Pneumonia    Polycystic disease, ovaries    Postoperative anemia    2018 - while dieting  hx of   Reflux    Sinus tachycardia 07/14/2012   Pt says HR > 100 is consistent   Sleep apnea    has had two tests, different results- no CPAP used   Past Surgical History:  Procedure Laterality Date   COLONOSCOPY     FOOT SURGERY     Right plantar facsiitis scrape   KNEE SURGERY Bilateral 2007   LAPAROSCOPIC GASTRIC SLEEVE RESECTION N/A 03/30/2017   Procedure: LAPAROSCOPIC GASTRIC SLEEVE RESECTION, UPPER ENDO;  Surgeon: Aldean Hummingbird, MD;  Location: [facility];  Service:  General;  Laterality: N/A;   NASAL SINUS SURGERY  2004   OVARIAN CYST REMOVAL  1998   A cyst on the fallopian tube removed   SPINAL FUSION     TONSILLECTOMY  1996   Family History  Problem Relation Age of Onset   Cancer Father        Leukemia   Diabetes Father    Heart disease Father        first PCI in his 41s   Diabetes Mother    Cancer Maternal Grandmother  Stomach   Colon cancer Maternal Grandmother    Cancer Paternal Grandmother    Cancer Paternal Aunt    Diabetes Paternal Aunt    Cancer Paternal Uncle    Cancer Paternal Aunt    Rectal cancer Neg Hx    Stomach cancer Neg Hx    Esophageal cancer Neg Hx    Social History   Socioeconomic History   Marital status: Single    Spouse name: Not on file   Number of children: 0   Years of education: Not on file   Highest education level: Not on file  Occupational History   Occupation: disabled    Employer: UNEMPLOYED  Tobacco Use   Smoking status: Never   Smokeless tobacco: Never  Vaping Use   Vaping status: Never Used  Substance and Sexual Activity   Alcohol use: No   Drug use: No   Sexual activity: Yes    Birth control/protection: None  Other Topics Concern   Not on file  Social History Narrative   Not on file   Social Drivers of Health   Financial Resource Strain: Low Risk  (08/12/2023)   Overall Financial Resource Strain (CARDIA)    Difficulty of Paying Living Expenses: Not hard at all  Food Insecurity: Low Risk  (08/05/2023)   Received from [facility]   Hunger Vital Sign    Worried About Running Out of Food in the Last Year: Never true    Ran Out of Food in the Last Year: Never true  Transportation Needs: No Transportation Needs (08/05/2023)   Received from [facility]    In the past 12 months, has lack of reliable transportation kept you from medical appointments, meetings, work or from getting things needed for daily living? : No  Physical Activity: Inactive (10/28/2023)    Exercise Vital Sign    Days of Exercise per Week: 0 days    Minutes of Exercise per Session: 0 min  Stress: Stress Concern Present (08/12/2023)   [employer] of Occupational Health - Occupational Stress Questionnaire    Feeling of Stress : To some extent  Social Connections: Moderately Isolated (08/12/2023)   Social Connection and Isolation Panel [NHANES]    Frequency of Communication with Friends and Family: More than three times a week    Frequency of Social Gatherings with Friends and Family: Once a week    Attends Religious Services: Never    Database administrator or Organizations: No    Attends Engineer, structural: Never    Marital Status: Married    Tobacco Counseling Counseling given: Not Answered   Clinical Intake:  Pre-visit preparation completed: Yes  Pain : 0-10 Pain Score: 6  Pain Type: Chronic pain Pain Location: Generalized Pain Descriptors / Indicators: Aching     BMI - recorded: 30.29 Nutritional Status: BMI > 30  Obese Nutritional Risks: None Diabetes: No  How often do you need to have someone help you when you read instructions, pamphlets, or other written materials from your doctor or pharmacy?: 1 - Never  Interpreter Needed?: No      Activities of Daily Living    10/28/2023   10:25 AM  In your present state of health, do you have any difficulty performing the following activities:  Hearing? 0  Vision? 0  Difficulty concentrating or making decisions? 1  Walking or climbing stairs? 0  Dressing or bathing? 0  Doing errands, shopping? 0  Preparing Food and eating ? N  Using the Toilet? N  In the past six months, have you accidently leaked urine? N  Do you have problems with loss of bowel control? N  Managing your Medications? N  Managing your Finances? N  Housekeeping or managing your Housekeeping? N    Patient Care Team: Cyndi Drain, Kirby Peoples as PCP - General (Physician Assistant) [place], Peter M, MD as PCP - Cardiology  (Cardiology) Raydell Cahill, MD as Referring Physician (Physical Medicine and Rehabilitation) Linde Reveal, NP as Nurse Practitioner (Psychiatry) Ander Bame, MD as Consulting Physician (Hematology and Oncology)  Indicate any recent Medical Services you may have received from other than [facility] providers in the past year (date may be approximate).     Assessment:   This is a routine wellness examination for [place].  Hearing/Vision screen No results found.   Goals Addressed             This Visit's Progress    Weight (lb) < 165 lb (74.8 kg)         Depression Screen    10/28/2023   10:46 AM 08/12/2023    9:23 AM 07/09/2018   11:13 AM 12/22/2017   12:36 PM 05/26/2017    4:41 PM 02/17/2017    2:41 PM  PHQ 2/9 Scores  PHQ - 2 Score 2 2 4 4  0 0  PHQ- 9 Score 12 13 19 18       Fall Risk    10/28/2023   10:25 AM 12/22/2017   12:36 PM 05/26/2017    4:41 PM 02/17/2017    2:41 PM  Fall Risk   Falls in the past year? 1 No No No  Number falls in past yr: 1     Injury with Fall? 1       MEDICARE RISK AT HOME: Medicare Risk at Home Any stairs in or around the home?: Yes If so, are there any without handrails?: No Home free of loose throw rugs in walkways, pet beds, electrical cords, etc?: Yes Adequate lighting in your home to reduce risk of falls?: Yes Life alert?: No Use of a cane, walker or w/c?: No Grab bars in the bathroom?: No Shower chair or bench in shower?: No Elevated toilet seat or a handicapped toilet?: No  TIMED UP AND GO:  Was the test performed?  Yes  Length of time to ambulate 10 feet: 5 sec Gait steady and fast without use of assistive device    Cognitive Function:        10/28/2023   10:29 AM  6CIT Screen  What Year? 0 points  What month? 0 points  What time? 0 points  Count back from 20 0 points  Months in reverse 0 points  Repeat phrase 0 points  Total Score 0 points    Immunizations Immunization History  Administered Date(s)  Administered   Influenza Split 04/18/2015, 03/13/2016, 03/31/2017, 03/30/2018, 05/16/2019   Influenza,inj,Quad PF,6+ Mos 03/13/2016, 03/31/2017, 03/30/2018, 05/16/2019   Influenza,inj,Quad PF,6-35 Mos 06/07/2020   Influenza-Unspecified 04/18/2015, 04/15/2023   Moderna Sars-Covid-2 Vaccination 10/04/2019, 11/14/2019   Tdap 06/18/2009, 04/07/2019, 05/16/2019   Zoster Recombinant(Shingrix) 11/30/2020, 03/28/2021    TDAP status: Up to date  Flu Vaccine status: Up to date  Covid-19 vaccine status: Declined, Education has been provided regarding the importance of this vaccine but patient still declined. Advised may receive this vaccine at local pharmacy or Health Dept.or vaccine clinic. Aware to provide a copy of the vaccination record if obtained from local pharmacy or Health Dept. Verbalized  acceptance and understanding.  Qualifies for Shingles Vaccine? Yes   Zostavax completed Yes   Shingrix Completed?: Yes  Screening Tests Health Maintenance  Topic Date Due   Pneumococcal Vaccine 71-15 Years old (1 of 2 - PCV) Never done   Hepatitis C Screening  08/11/2024 (Originally 03/21/1984)   INFLUENZA VACCINE  02/05/2024   Cervical Cancer Screening (HPV/Pap Cotest)  05/15/2024   MAMMOGRAM  10/06/2024   Medicare Annual Wellness (AWV)  10/27/2024   Colonoscopy  09/07/2028   DTaP/Tdap/Td (4 - Td or Tdap) 05/15/2029   HIV Screening  Completed   Zoster Vaccines- Shingrix  Completed   HPV VACCINES  Aged Out   Meningococcal B Vaccine  Aged Out   COVID-19 Vaccine  Discontinued   Fecal DNA (Cologuard)  Discontinued    Health Maintenance  Health Maintenance Due  Topic Date Due   Pneumococcal Vaccine 109-47 Years old (1 of 2 - PCV) Never done    Colorectal cancer screening: Type of screening: Colonoscopy. Completed  Mammogram status: Completed 10/2023. Repeat every year  Bone Density status: Ordered   Lung Cancer Screening: (Low Dose CT Chest recommended if Age 11-80 years, 20 pack-year  currently smoking OR have quit w/in 15years.) does not qualify.   Additional Screening:  Vision Screening: Recommended annual ophthalmology exams for early detection of glaucoma and other disorders of the eye. Is the patient up to date with their annual eye exam?  Yes  Who is the provider or what is the name of the office in which the patient attends annual eye exams? [facility] [place])  Dental Screening: Recommended annual dental exams for proper oral hygiene  Community Resource Referral / Chronic Care Management: CRR required this visit?  No   CCM required this visit?  No     Plan:    1- Updated HM with recent Mammogram found in Care Everywhere 2- Ordered DEXA  3- Requested records from  [facility] for recent PAP 4- Recommended continue regular appointments with pain management and psych for medication management  I have personally reviewed and noted the following in the patient's chart:   Medical and social history Use of alcohol, tobacco or illicit drugs  Current medications and supplements including opioid prescriptions.  Functional ability and status Nutritional status Physical activity Advanced directives List of other physicians Hospitalizations, surgeries, and ER visits in previous 12 months Vitals Screenings to include cognitive, depression, and falls Referrals and appointments  In addition, I have reviewed and discussed with patient certain preventive protocols, quality metrics, and best practice recommendations. A written personalized care plan for preventive services as well as general preventive health recommendations were provided to patient.     Ruthellen Cowden, LPN   1/61/0960

## 2023-11-01 ENCOUNTER — Encounter: Payer: Self-pay | Admitting: Family Medicine

## 2023-11-02 ENCOUNTER — Ambulatory Visit (HOSPITAL_BASED_OUTPATIENT_CLINIC_OR_DEPARTMENT_OTHER): Admitting: Radiology

## 2023-11-04 DIAGNOSIS — F3175 Bipolar disorder, in partial remission, most recent episode depressed: Secondary | ICD-10-CM | POA: Diagnosis not present

## 2023-11-05 DIAGNOSIS — M47816 Spondylosis without myelopathy or radiculopathy, lumbar region: Secondary | ICD-10-CM | POA: Diagnosis not present

## 2023-11-05 DIAGNOSIS — M1711 Unilateral primary osteoarthritis, right knee: Secondary | ICD-10-CM | POA: Diagnosis not present

## 2023-11-05 DIAGNOSIS — M62830 Muscle spasm of back: Secondary | ICD-10-CM | POA: Diagnosis not present

## 2023-11-05 DIAGNOSIS — G894 Chronic pain syndrome: Secondary | ICD-10-CM | POA: Diagnosis not present

## 2023-11-10 DIAGNOSIS — M19072 Primary osteoarthritis, left ankle and foot: Secondary | ICD-10-CM | POA: Diagnosis not present

## 2023-11-10 DIAGNOSIS — B351 Tinea unguium: Secondary | ICD-10-CM | POA: Diagnosis not present

## 2023-11-10 DIAGNOSIS — L602 Onychogryphosis: Secondary | ICD-10-CM | POA: Diagnosis not present

## 2023-11-10 DIAGNOSIS — M19071 Primary osteoarthritis, right ankle and foot: Secondary | ICD-10-CM | POA: Diagnosis not present

## 2023-11-10 DIAGNOSIS — L603 Nail dystrophy: Secondary | ICD-10-CM | POA: Diagnosis not present

## 2023-11-11 ENCOUNTER — Telehealth: Payer: Self-pay | Admitting: *Deleted

## 2023-11-11 NOTE — Telephone Encounter (Signed)
 TCT patient regarding lab results. Spoke with her. Advised that her ferritin levels have fallen to 14. Our recommendation is try to keep those levels above 30. If she is agreeable Dr. Rosaline Coma  recommends another round of IV iron therapy. We will plan to see her back approximate 4 to 6 weeks after her last dose of IV iron in order to ensure her levels are adequately repleted.  Pt is agreeable to the IV iron Message sent to Wyline Hearing, PA-C requesting IV iron orders.

## 2023-11-11 NOTE — Telephone Encounter (Signed)
-----   Message from Rogerio Clay IV sent at 10/19/2023  4:52 PM EDT ----- Please let Ms. Flammer know that her ferritin levels have fallen to 14.  Our recommendation is try to keep those levels above 30.  If she is upward I would recommend another round of IV iron therapy.  We will plan to see her back approximate 4 to 6 weeks after her last dose of IV iron in order to ensure her levels are adequately repleted. ----- Message ----- From: Dannis Dy, Lab In [place] Sent: 10/19/2023  10:36 AM EDT To: Ander Bame, MD

## 2023-11-12 ENCOUNTER — Telehealth: Payer: Self-pay | Admitting: Pharmacy Technician

## 2023-11-12 ENCOUNTER — Other Ambulatory Visit: Payer: Self-pay | Admitting: Physician Assistant

## 2023-11-12 DIAGNOSIS — F3175 Bipolar disorder, in partial remission, most recent episode depressed: Secondary | ICD-10-CM | POA: Diagnosis not present

## 2023-11-12 NOTE — Telephone Encounter (Addendum)
 Delores Fester  Feraheme  is non preferred and will likely be denied if patient has not failed preferred medication. Preferred medication is Venofer. Would you like to use Venofer? Ref; [id number]  Auth Submission: NO AUTH NEEDED Site of care: Site of care: [facility] Payer: [employer] Medicare Medication & CPT/J Code(s) submitted: Venofer (Iron Sucrose) J1756 Route of submission (phone, fax, portal):  Phone # Fax # Auth type: Buy/Bill PB Units/visits requested:  Reference number:  Approval from: 11/12/23 to 03/14/24

## 2023-11-12 NOTE — Telephone Encounter (Signed)
 error

## 2023-11-13 ENCOUNTER — Other Ambulatory Visit: Payer: Self-pay | Admitting: Physician Assistant

## 2023-11-18 ENCOUNTER — Ambulatory Visit

## 2023-11-18 VITALS — BP 141/78 | HR 72 | Temp 98.0°F | Resp 16 | Ht 67.0 in | Wt 191.6 lb

## 2023-11-18 DIAGNOSIS — Z9884 Bariatric surgery status: Secondary | ICD-10-CM

## 2023-11-18 DIAGNOSIS — D508 Other iron deficiency anemias: Secondary | ICD-10-CM

## 2023-11-18 DIAGNOSIS — K9589 Other complications of other bariatric procedure: Secondary | ICD-10-CM

## 2023-11-18 MED ORDER — IRON SUCROSE 20 MG/ML IV SOLN
200.0000 mg | Freq: Once | INTRAVENOUS | Status: AC
Start: 2023-11-18 — End: 2023-11-18
  Administered 2023-11-18: 200 mg via INTRAVENOUS
  Filled 2023-11-18: qty 10

## 2023-11-18 NOTE — Progress Notes (Signed)
 Diagnosis: Acute Anemia  Provider:  Chilton Greathouse MD  Procedure: IV Push  IV Type: Peripheral, IV Location: R Antecubital  Venofer (Iron Sucrose), Dose: 200 mg  Post Infusion IV Care: Observation period completed and Peripheral IV Discontinued  Discharge: Condition: Good, Destination: Home . AVS Provided  Performed by:  Nat Math, RN

## 2023-11-20 ENCOUNTER — Other Ambulatory Visit: Payer: Self-pay

## 2023-11-23 ENCOUNTER — Telehealth: Payer: Self-pay

## 2023-11-23 ENCOUNTER — Other Ambulatory Visit (HOSPITAL_COMMUNITY): Payer: Self-pay

## 2023-11-23 NOTE — Telephone Encounter (Signed)
 Pharmacy Patient Advocate Encounter   Received notification from CoverMyMeds that prior authorization for Nurtec 75MG  dispersible tablets is required/requested.   Insurance verification completed.   The patient is insured through [facility] .   Per test claim: PA required; PA submitted to above mentioned insurance via CoverMyMeds Key/confirmation #/EOC [id number] Status is pending

## 2023-11-23 NOTE — Telephone Encounter (Signed)
 Pharmacy Patient Advocate Encounter  Received notification from [facility] that Prior Authorization for Nurtec 75MG  dispersible tablets has been APPROVED from 11/23/2023 to 07/06/2024. Ran test claim, Copay is $0. This test claim was processed through [facility]- copay amounts may vary at other pharmacies due to pharmacy/plan contracts, or as the patient moves through the different stages of their insurance plan.   PA #/Case ID/Reference #: PA Case ID #: [id number]

## 2023-11-24 ENCOUNTER — Encounter: Payer: Self-pay | Admitting: Neurology

## 2023-11-25 ENCOUNTER — Encounter: Payer: Self-pay | Admitting: Family Medicine

## 2023-11-25 ENCOUNTER — Ambulatory Visit (INDEPENDENT_AMBULATORY_CARE_PROVIDER_SITE_OTHER): Admitting: Family Medicine

## 2023-11-25 ENCOUNTER — Other Ambulatory Visit: Payer: Self-pay

## 2023-11-25 ENCOUNTER — Ambulatory Visit (INDEPENDENT_AMBULATORY_CARE_PROVIDER_SITE_OTHER)

## 2023-11-25 VITALS — BP 159/80 | HR 78 | Temp 98.1°F | Resp 16 | Ht 67.0 in | Wt 190.2 lb

## 2023-11-25 VITALS — BP 150/92 | HR 76 | Ht 67.0 in | Wt 189.0 lb

## 2023-11-25 DIAGNOSIS — G8929 Other chronic pain: Secondary | ICD-10-CM

## 2023-11-25 DIAGNOSIS — M1711 Unilateral primary osteoarthritis, right knee: Secondary | ICD-10-CM

## 2023-11-25 DIAGNOSIS — M25561 Pain in right knee: Secondary | ICD-10-CM

## 2023-11-25 DIAGNOSIS — G5603 Carpal tunnel syndrome, bilateral upper limbs: Secondary | ICD-10-CM | POA: Diagnosis not present

## 2023-11-25 DIAGNOSIS — M25512 Pain in left shoulder: Secondary | ICD-10-CM

## 2023-11-25 DIAGNOSIS — Z9884 Bariatric surgery status: Secondary | ICD-10-CM

## 2023-11-25 DIAGNOSIS — R202 Paresthesia of skin: Secondary | ICD-10-CM

## 2023-11-25 DIAGNOSIS — D508 Other iron deficiency anemias: Secondary | ICD-10-CM | POA: Diagnosis not present

## 2023-11-25 DIAGNOSIS — Z903 Acquired absence of stomach [part of]: Secondary | ICD-10-CM | POA: Insufficient documentation

## 2023-11-25 DIAGNOSIS — M542 Cervicalgia: Secondary | ICD-10-CM

## 2023-11-25 DIAGNOSIS — K9589 Other complications of other bariatric procedure: Secondary | ICD-10-CM

## 2023-11-25 LAB — VITAMIN B12: Vitamin B-12: 642 pg/mL (ref 211–911)

## 2023-11-25 MED ORDER — IRON SUCROSE 20 MG/ML IV SOLN
200.0000 mg | Freq: Once | INTRAVENOUS | Status: AC
Start: 2023-11-25 — End: 2023-11-25
  Administered 2023-11-25: 200 mg via INTRAVENOUS
  Filled 2023-11-25: qty 10

## 2023-11-25 NOTE — Progress Notes (Signed)
 Diagnosis: Iron Deficiency Anemia  Provider:  Chilton Greathouse MD  Procedure: IV Push  IV Type: Peripheral, IV Location: R Antecubital  Venofer (Iron Sucrose), Dose: 200 mg  Post Infusion IV Care: Observation period completed and Peripheral IV Discontinued  Discharge: Condition: Good, Destination: Home . AVS Declined  Performed by:  Adriana Mccallum, RN

## 2023-11-25 NOTE — Progress Notes (Signed)
 I, Miquel Amen, CMA acting as a scribe for Garlan Juniper, MD.  [name] is a 58 y.o. female who presents to [facility] today for neck and bilat shoulder pain. Pt was last seen for these issues on 11/10/22.  Today, pt reports neck and shoulder pain over the past month. Denies injury. Sx primarily left-sided, radiating into the periscapular region. TTP at posterior aspect of the eft shoulder. Radiating pain into both arms, L>R. Also notes worsening bilat carpal tunnel sx recently, n/t and decreased grip strength related to this. Notes some weakness in the left arm, unable to carry heavier things. Minimal relief with OTC treatment options.   Additionally she has a history of bilateral carpal tunnel syndrome.  Her pain management doctor has performed a right carpal tunnel injection and it was only mildly beneficial.  She has had a nerve conduction study with the same pain management provider and apparently has pretty bad carpal tunnel syndrome.  Patient does not remember exactly if the nerve conduction study showed moderate or severe.  Her pain management doctor is reportedly recommending surgery consultation.  Additionally she has chronic right knee pain due to osteoarthritis and is considering a knee replacement.  She would like a referral to start talking about it.  Radiates: upper arm UE Numbness/tingling: yes UE Weakness: yes Aggravates: ROM Treatments tried: heat, Voltaren  Gel, BioFreeze  Dx testing: 04/22/23 C-spine CT  11/10/22 C-spine & L shoulder XR  Pertinent review of systems: No fevers or chills  Relevant historical information: Migraine headache.  History of bariatric surgery.   Exam:  BP (!) 150/92   Pulse 76   Ht 5\' 7"  (1.702 m)   Wt 189 lb (85.7 kg)   LMP 08/22/2016 (Approximate)   SpO2 99%   BMI 29.60 kg/m  General: Well Developed, well nourished, and in no acute distress.   MSK: Left shoulder: Normal-appearing Normal motion  pain with abduction. Intact strength. Positive Hawkins and Neer's test.    Lab and Radiology Results  Procedure: Real-time Ultrasound Guided Injection of left shoulder subacromial bursa Device: Philips Affiniti 50G/GE Logiq Images permanently stored and available for review in PACS Verbal informed consent obtained.  Discussed risks and benefits of procedure. Warned about infection, bleeding, hyperglycemia damage to structures among others. Patient expresses understanding and agreement Time-out conducted.   Noted no overlying erythema, induration, or other signs of local infection.   Skin prepped in a sterile fashion.   Local anesthesia: Topical Ethyl chloride.   With sterile technique and under real time ultrasound guidance: 40 mg of Kenalog  and 2 mL of Marcaine  injected into subacromial bursa. Fluid seen entering the bursa.   Completed without difficulty   Pain immediately resolved suggesting accurate placement of the medication.   Advised to call if fevers/chills, erythema, induration, drainage, or persistent bleeding.   Images permanently stored and available for review in the ultrasound unit.  Impression: Technically successful ultrasound guided injection.       Assessment and Plan: 58 y.o. 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  Plan for steroid injection and home exercise program.  Hand paresthesia due to bilateral carpal tunnel syndrome.  Patient will try to get the nerve conduction study report.  Will go ahead and check vitamin B12 given her history of bariatric surgery.  Additionally will refer to hand surgery to discuss surgical options.  She will bring the nerve conduction study report to the hand surgery consultation.  Additionally she has chronic right knee pain due to DJD.  Plan to refer to orthopedic surgery to discuss total knee replacement options.   PDMP not reviewed this encounter. Orders Placed This Encounter   Procedures   US  LIMITED JOINT SPACE STRUCTURES UP BILAT(NO LINKED CHARGES)    Reason for Exam (SYMPTOM  OR DIAGNOSIS REQUIRED):   bilat shoulder pain    Preferred imaging location?:   [facility]   Vitamin B12   Ambulatory referral to Orthopedic Surgery    Referral Priority:   Routine    Referral Type:   Surgical    Referral Reason:   Specialty Services Required    Referred to Provider:   Arnie Lao, MD    Requested Specialty:   Orthopedic Surgery    Number of Visits Requested:   1   Ambulatory referral to Orthopedic Surgery    Referral Priority:   Routine    Referral Type:   Surgical    Referral Reason:   Specialty Services Required    Referred to Provider:   Merrill Abide, MD    Requested Specialty:   Orthopedic Surgery    Number of Visits Requested:   1   No orders of the defined types were placed in this encounter.    Discussed warning signs or symptoms. Please see discharge instructions. Patient expresses understanding.   The above documentation has been reviewed and is accurate and complete Garlan Juniper, M.D.

## 2023-11-25 NOTE — Patient Instructions (Addendum)
 Thank you for coming in today.   You received an injection today. Seek immediate medical attention if the joint becomes red, extremely painful, or is oozing fluid.   Stop by lab for blood draw before you leave.  Referral to Dr. Lucienne Ryder for consult for knee replacement.   Referral to Dr. Merlinda Starling for Carpal Tunnel.  See you back as needed.

## 2023-11-26 ENCOUNTER — Ambulatory Visit: Payer: Self-pay | Admitting: Family Medicine

## 2023-11-26 DIAGNOSIS — F3175 Bipolar disorder, in partial remission, most recent episode depressed: Secondary | ICD-10-CM | POA: Diagnosis not present

## 2023-11-26 NOTE — Progress Notes (Signed)
 B12 level is normal

## 2023-12-08 DIAGNOSIS — M65972 Unspecified synovitis and tenosynovitis, left ankle and foot: Secondary | ICD-10-CM | POA: Diagnosis not present

## 2023-12-08 DIAGNOSIS — B351 Tinea unguium: Secondary | ICD-10-CM | POA: Diagnosis not present

## 2023-12-08 DIAGNOSIS — G894 Chronic pain syndrome: Secondary | ICD-10-CM | POA: Diagnosis not present

## 2023-12-08 DIAGNOSIS — M62830 Muscle spasm of back: Secondary | ICD-10-CM | POA: Diagnosis not present

## 2023-12-08 DIAGNOSIS — M1711 Unilateral primary osteoarthritis, right knee: Secondary | ICD-10-CM | POA: Diagnosis not present

## 2023-12-08 DIAGNOSIS — M47816 Spondylosis without myelopathy or radiculopathy, lumbar region: Secondary | ICD-10-CM | POA: Diagnosis not present

## 2023-12-08 DIAGNOSIS — M19072 Primary osteoarthritis, left ankle and foot: Secondary | ICD-10-CM | POA: Diagnosis not present

## 2023-12-08 DIAGNOSIS — M19071 Primary osteoarthritis, right ankle and foot: Secondary | ICD-10-CM | POA: Diagnosis not present

## 2023-12-09 ENCOUNTER — Ambulatory Visit

## 2023-12-09 VITALS — BP 136/80 | HR 78 | Temp 98.9°F | Resp 16 | Ht 67.0 in | Wt 186.2 lb

## 2023-12-09 DIAGNOSIS — Z9884 Bariatric surgery status: Secondary | ICD-10-CM

## 2023-12-09 DIAGNOSIS — D508 Other iron deficiency anemias: Secondary | ICD-10-CM

## 2023-12-09 DIAGNOSIS — K9589 Other complications of other bariatric procedure: Secondary | ICD-10-CM | POA: Diagnosis not present

## 2023-12-09 MED ORDER — IRON SUCROSE 20 MG/ML IV SOLN
200.0000 mg | Freq: Once | INTRAVENOUS | Status: AC
  Administered 2023-12-09: 200 mg via INTRAVENOUS
  Filled 2023-12-09: qty 10

## 2023-12-09 NOTE — Progress Notes (Signed)
 Diagnosis: Iron  Deficiency Anemia  Provider:  Praveen Mannam MD  Procedure: IV Push  IV Type: Peripheral, IV Location: R Antecubital  Venofer  (Iron  Sucrose), Dose: 200 mg  Post Infusion IV Care: Patient declined observation  Discharge: Condition: Good, Destination: Home . AVS Provided  Performed by:  Rachelle Bue, RN

## 2023-12-15 DIAGNOSIS — F3175 Bipolar disorder, in partial remission, most recent episode depressed: Secondary | ICD-10-CM | POA: Diagnosis not present

## 2023-12-21 ENCOUNTER — Telehealth: Admitting: Adult Health

## 2023-12-21 DIAGNOSIS — F3175 Bipolar disorder, in partial remission, most recent episode depressed: Secondary | ICD-10-CM | POA: Diagnosis not present

## 2023-12-21 DIAGNOSIS — G43709 Chronic migraine without aura, not intractable, without status migrainosus: Secondary | ICD-10-CM

## 2023-12-21 DIAGNOSIS — G43711 Chronic migraine without aura, intractable, with status migrainosus: Secondary | ICD-10-CM | POA: Diagnosis not present

## 2023-12-21 MED ORDER — NURTEC 75 MG PO TBDP: ORAL_TABLET | ORAL | 11 refills | Status: DC

## 2023-12-21 MED ORDER — AJOVY 225 MG/1.5ML ~~LOC~~ SOAJ: 225.0000 mg | SUBCUTANEOUS | 11 refills | Status: AC
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
## 2023-12-21 NOTE — Patient Instructions (Signed)
 Your Plan:  Continue Ajovy  for migraine prevention Continue Nurtec for abortive therapy     Thank you for coming to see us  at [facility]. I hope we have been able to provide you high quality care today.  You may receive a patient satisfaction survey over the next few weeks. We would appreciate your feedback and comments so that we may continue to improve ourselves and the health of our patients.

## 2023-12-24 ENCOUNTER — Other Ambulatory Visit: Payer: Self-pay

## 2023-12-24 ENCOUNTER — Ambulatory Visit: Admitting: Orthopedic Surgery

## 2023-12-24 DIAGNOSIS — R202 Paresthesia of skin: Secondary | ICD-10-CM

## 2023-12-24 DIAGNOSIS — M25531 Pain in right wrist: Secondary | ICD-10-CM

## 2023-12-24 DIAGNOSIS — G5622 Lesion of ulnar nerve, left upper limb: Secondary | ICD-10-CM | POA: Diagnosis not present

## 2023-12-24 DIAGNOSIS — M654 Radial styloid tenosynovitis [de Quervain]: Secondary | ICD-10-CM | POA: Diagnosis not present

## 2023-12-24 DIAGNOSIS — R200 Anesthesia of skin: Secondary | ICD-10-CM | POA: Diagnosis not present

## 2023-12-24 NOTE — Progress Notes (Signed)
 [name] - 58 y.o. female MRN [id number]  Date of birth: 04/01/1966  Office Visit Note: Visit Date: 12/24/2023 PCP: Cyndi Drain, PA-C Referred by: Syliva Even, MD  Subjective: No chief complaint on file.  HPI: [name] is a pleasant 58 y.o. female who presents today for bilateral hand complaints.  She is describing pain along the radial aspect of the wrist with radiation into the thumb and proximally into the forearm.  She feels that this is related mostly to overuse.  She did have a recent injection to the right carpal tunnel done by pain management with improvement in prior symptoms of numbness and tingling.  She is also describing some numbness and tingling in the ulnar aspect of the left hand, particularly when her elbow is in a bent position.  Pertinent ROS were reviewed with the patient and found to be negative unless otherwise specified above in HPI.   Visit Reason:Right wrist pain that goes into thumb, left hand numbness and tingling ring and small finger Duration of symptoms: 4-5 years Hand dominance: right Occupation: disabled Diabetic: No Smoking: No Heart/Lung History:none Blood Thinners: none  Prior Testing/EMG: 02/26/21 Injections (Date):yes Treatments: right carpal tunnel inj 1 month ago that helped Prior Surgery:none  Assessment & Plan: Visit Diagnoses:  1. De Quervain's tenosynovitis, right   2. Pain in right wrist   3. Numbness and tingling in left hand   4. Cubital tunnel syndrome, left     Plan: Extensive discussion was had with the patient today regarding her right wrist radial sided pain.  She has signs and symptoms consistent with de Quervain's tenosynovitis.  We discussed the underlying etiology and pathophysiology of this condition as well as treatment modalities ranging from conservative to surgical.  From a conservative standpoint, we discussed activity modification, anti-inflammatory medication with topical and oral, bracing, therapy  and cortisone injections.  From a surgical standpoint, I explained to her that we can perform right wrist first extensor compartment release should symptoms remain affected conservative care.  Given that she has not trialed any significant interventions at this time, she would like to begin with bracing indicating modification.  Comfort Cool brace was fitted today, she can follow-up in approximately 6 to 8 weeks for recheck of her symptoms.  As for the left upper extremity.  She has signs symptoms consistent with cubital tunnel syndrome on the left side which is rather mild in nature.  I reviewed her electrodiagnostic study from 2022 which did not show any significant nerve conduction velocity slowing at the elbow region.  Her symptoms appear to be more dynamic in nature, related to a bent elbow position.  I explained to her towel splinting particular for nocturnal symptoms as well as staying out of a bent elbow position during daytime activities.  I have also recommended that she do occupational therapy for nerve gliding exercises of the left elbow to try and help alleviate symptoms.  At her follow-up in approximate 6 to 8 weeks we will check to see how she is responding to these conservative measures.  She expressed understanding.  I spent 45 minutes in the care of this patient today including review of previous documentation, imaging obtained, face-to-face time discussing all options regarding treatment and documenting the encounter.   Follow-up: No follow-ups on file.   Meds & Orders: No orders of the defined types were placed in this encounter.   Orders Placed This Encounter  Procedures   XR Wrist Complete Right  XR Elbow Complete Left (3+View)   Ambulatory referral to Occupational Therapy     Procedures: No procedures performed      Clinical History: No specialty comments available.  She reports that she has never smoked. She has never used smokeless tobacco. No results for input(s):  HGBA1C, LABURIC in the last 8760 hours.  Objective:   Vital Signs: LMP 08/22/2016 (Approximate)   Physical Exam  Gen: Well-appearing, in no acute distress; non-toxic CV: Regular Rate. Well-perfused. Warm.  Resp: Breathing unlabored on room air; no wheezing. Psych: Fluid speech in conversation; appropriate affect; normal thought process  Ortho Exam PHYSICAL EXAM:  General: Patient is well appearing and in no distress.   Skin and Muscle: No significant skin changes are apparent to upper extremities.   Range of Motion and Palpation Tests: Mobility is full about the elbows with flexion and extension.  No evidence of nerve subluxation at left elbow.  Forearm supination and pronation are 85/85 bilaterally.  Wrist flexion/extension is 75/65 bilaterally.  Digital flexion and extension are full.  Thumb opposition is full to the base of the small fingers bilaterally.    No cords or nodules are palpated.  No triggering is observed.    Notable tenderness over the right wrist radial styloid region.  Finklestein test is positive right.  No evidence of radiocarpal, midcarpal or intercarpal joint instability with provocation.  Neurologic, Vascular, Motor: Sensation is intact to light touch in the bilateral median nerve distributions, slightly diminished left hand ulnar distribution.    Tinel sign cubital tunnel: Positive left  Motor left side SF FDP: 5/5 Froment: Negative Wartenberg: Negative FDI wasting: Negative  Fingers pink and well perfused.  Capillary refill is brisk.     Lab Results  Component Value Date   HGBA1C 5.8 (H) 05/16/2019     Imaging: No results found.  Past Medical/Family/Surgical/Social History: Medications & Allergies reviewed per EMR, new medications updated. 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   Chronic migraine without aura, with intractable migraine, so stated, with status migrainosus 01/25/2023   Lymphadenopathy, anterior cervical 07/13/2020   Sebaceous cyst 07/13/2020   Iron  deficiency anemia secondary to inadequate dietary iron  intake 01/12/2020   Constipation 12/13/2019   Hyperlipidemia LDL goal <130 09/08/2019   Positive colorectal cancer screening using Cologuard test 03/02/2018   Leukocytosis 01/26/2018   Onychomycosis of great toe 12/22/2017   Family history of DVT 03/20/2017   Primary osteoarthritis of right knee 05/13/2016   Dyslipidemia 03/04/2016   Right knee pain 03/03/2016   Pre-diabetes 11/01/2015   Allergic rhinitis 05/28/2015   BMI 33.0-33.9,adult 05/24/2013   Polycystic ovary disease 07/14/2012   Obstructive sleep apnea (adult) (pediatric) 02/04/2012   Perimenopausal 11/10/2011   Disturbance in sleep behavior 09/16/2011   Asthma 05/23/2011   Backache 05/23/2011   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) 05/23/2011   Dysphagia 05/23/2011   Esophageal reflux 05/23/2011   Past Medical History:  Diagnosis Date   Allergy    Anxiety    Arthritis    Asthma    History of Asthma   Bipolar disorder (HCC)    Chest pain, atypical 11/30/2012   Clotting disorder (HCC)    Depression    Diabetes mellitus without complication (HCC)    type 2,  pre-diabetic before she lost weight   GERD (gastroesophageal reflux disease)    H/O degenerative disc disease    L4-L5, L5-S1   Heart murmur    Hyperglycemia    Postoperative hyperglycemia   Hypertension    hx of   Neuromuscular disorder (HCC)    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   Obesity 07/14/2012   OSA (obstructive sleep apnea)    mild   Pneumonia    Polycystic disease, ovaries    Postoperative anemia    2018 - while dieting  hx of   Reflux    Sinus tachycardia 07/14/2012   Pt says HR > 100  is consistent   Sleep apnea    has had two tests, different results- no CPAP used   Family History  Problem Relation Age of Onset   Cancer Father        Leukemia   Diabetes Father    Heart disease Father        first PCI in his 31s   Diabetes Mother    Cancer Maternal Grandmother        Stomach   Colon cancer Maternal Grandmother    Cancer Paternal Grandmother    Cancer Paternal Aunt    Diabetes Paternal Aunt    Cancer Paternal Uncle    Cancer Paternal Aunt    Rectal cancer Neg Hx    Stomach cancer Neg Hx    Esophageal cancer Neg Hx    Past Surgical History:  Procedure Laterality Date   COLONOSCOPY     FOOT SURGERY     Right plantar facsiitis scrape   KNEE SURGERY Bilateral 2007   LAPAROSCOPIC GASTRIC SLEEVE RESECTION N/A 03/30/2017   Procedure: LAPAROSCOPIC GASTRIC SLEEVE RESECTION, UPPER ENDO;  Surgeon: Aldean Hummingbird, MD;  Location: [facility];  Service: General;  Laterality: N/A;   NASAL SINUS SURGERY  2004   OVARIAN CYST REMOVAL  1998   A cyst on the fallopian tube removed   SPINAL FUSION     TONSILLECTOMY  1996   Social History   Occupational History   Occupation: disabled    Associate Professor: UNEMPLOYED  Tobacco Use   Smoking status: Never   Smokeless tobacco: Never  Vaping Use   Vaping status: Never Used  Substance and Sexual Activity   Alcohol use: No   Drug use: No   Sexual activity: Yes    Birth control/protection: None    Trenita Hulme Merlinda Starling) Marce Sensing, M.D. [facility], Hand Surgery

## 2023-12-28 ENCOUNTER — Ambulatory Visit (INDEPENDENT_AMBULATORY_CARE_PROVIDER_SITE_OTHER): Admitting: Orthopaedic Surgery

## 2023-12-28 VITALS — Ht 67.0 in | Wt 186.0 lb

## 2023-12-28 DIAGNOSIS — M25561 Pain in right knee: Secondary | ICD-10-CM

## 2023-12-28 DIAGNOSIS — G8929 Other chronic pain: Secondary | ICD-10-CM

## 2023-12-28 DIAGNOSIS — M1711 Unilateral primary osteoarthritis, right knee: Secondary | ICD-10-CM | POA: Diagnosis not present

## 2023-12-28 NOTE — Progress Notes (Signed)
 The patient is a 58 year old female that I am seeing for the first time sent to me from Dr. 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  She injured his knee many years ago.  She is only 58 years old.  She has had arthroscopic surgery on that knee as well as multiple injections and attempts at conservative treatment.  She used to be mildly obese.  She has had a gastric sleeve surgery and is not obese anymore.  She does report daily right knee pain that is detrimentally affecting her mobility, her quality of life and her actives daily living.  She is in chronic pain management and is on Dilaudid  orally as well as fentanyl  patches.  She does have pain that wakes her up at night and is sent to us  to consider knee replacement surgery for her given the failure of all forms conservative treatment.  I was able to review all of her past medical history and medications within epic.  On exam her right knee has slight valgus malalignment.  There is no effusion today.  There is well-healed arthroscopy portal sites.  She has good range of motion of the knee and it feels ligamentously stable but is painful throughout the arc of motion in the knee.  Her patella slightly tracks laterally.  She has had a lateral release.  There is significant patellofemoral grinding and crepitation.  X-rays of the right knee as well as the MRI show significant tricompartment arthritis.  There is subchondral edema in the lateral tibial plateau.  There are osteophytes in all 3 compartments and significant cartilage wear in all 3 compartments as well as degenerative meniscal tearing.  At this point we did discuss knee replacement surgery.  She understands that this is quite a painful surgery for someone young and due to the fact that she is on significant high-dose narcotic medications, pain control postoperatively will be significantly difficult.  I did show her knee replacement model and went  over her imaging studies.  We discussed the risks and benefits of the surgery and what to expect from an intraoperative and postoperative standpoint.  She does have our surgery scheduler's card.  She would like to get back to riding horses easier when she is gotten through surgery.  She would like to have this potentially scheduled for the fall.  She will call us  at a closer date when she would like to consider having knee replacement surgery.  She is welcome to reach out to us  anytime to answer any further questions and concerns.

## 2024-01-05 DIAGNOSIS — M62830 Muscle spasm of back: Secondary | ICD-10-CM | POA: Diagnosis not present

## 2024-01-05 DIAGNOSIS — M47816 Spondylosis without myelopathy or radiculopathy, lumbar region: Secondary | ICD-10-CM | POA: Diagnosis not present

## 2024-01-05 DIAGNOSIS — G894 Chronic pain syndrome: Secondary | ICD-10-CM | POA: Diagnosis not present

## 2024-01-05 DIAGNOSIS — M1711 Unilateral primary osteoarthritis, right knee: Secondary | ICD-10-CM | POA: Diagnosis not present

## 2024-01-06 DIAGNOSIS — F3175 Bipolar disorder, in partial remission, most recent episode depressed: Secondary | ICD-10-CM | POA: Diagnosis not present

## 2024-01-13 ENCOUNTER — Ambulatory Visit: Admitting: Occupational Therapy

## 2024-01-18 ENCOUNTER — Ambulatory Visit (INDEPENDENT_AMBULATORY_CARE_PROVIDER_SITE_OTHER): Admitting: Physician Assistant

## 2024-01-18 ENCOUNTER — Encounter: Payer: Self-pay | Admitting: Physician Assistant

## 2024-01-18 VITALS — BP 116/68 | HR 86 | Temp 97.8°F | Ht 67.0 in | Wt 188.2 lb

## 2024-01-18 DIAGNOSIS — F3177 Bipolar disorder, in partial remission, most recent episode mixed: Secondary | ICD-10-CM

## 2024-01-18 DIAGNOSIS — J3089 Other allergic rhinitis: Secondary | ICD-10-CM

## 2024-01-18 DIAGNOSIS — L920 Granuloma annulare: Secondary | ICD-10-CM

## 2024-01-18 DIAGNOSIS — F410 Panic disorder [episodic paroxysmal anxiety] without agoraphobia: Secondary | ICD-10-CM

## 2024-01-18 DIAGNOSIS — M545 Low back pain, unspecified: Secondary | ICD-10-CM | POA: Diagnosis not present

## 2024-01-18 DIAGNOSIS — G43711 Chronic migraine without aura, intractable, with status migrainosus: Secondary | ICD-10-CM

## 2024-01-18 DIAGNOSIS — J4540 Moderate persistent asthma, uncomplicated: Secondary | ICD-10-CM | POA: Diagnosis not present

## 2024-01-18 DIAGNOSIS — D508 Other iron deficiency anemias: Secondary | ICD-10-CM

## 2024-01-18 DIAGNOSIS — F411 Generalized anxiety disorder: Secondary | ICD-10-CM | POA: Diagnosis not present

## 2024-01-18 DIAGNOSIS — L299 Pruritus, unspecified: Secondary | ICD-10-CM | POA: Diagnosis not present

## 2024-01-18 DIAGNOSIS — F5101 Primary insomnia: Secondary | ICD-10-CM

## 2024-01-18 DIAGNOSIS — D649 Anemia, unspecified: Secondary | ICD-10-CM | POA: Insufficient documentation

## 2024-01-18 DIAGNOSIS — K5909 Other constipation: Secondary | ICD-10-CM

## 2024-01-18 DIAGNOSIS — K219 Gastro-esophageal reflux disease without esophagitis: Secondary | ICD-10-CM | POA: Diagnosis not present

## 2024-01-18 DIAGNOSIS — L570 Actinic keratosis: Secondary | ICD-10-CM | POA: Insufficient documentation

## 2024-01-18 MED ORDER — FLUTICASONE PROPIONATE 50 MCG/ACT NA SUSP: 2.0000 | Freq: Every day | NASAL | 6 refills | Status: AC

## 2024-01-18 MED ORDER — EPINEPHRINE 0.3 MG/0.3ML IJ SOAJ: 0.3000 mg | INTRAMUSCULAR | 1 refills | Status: AC | PRN

## 2024-01-18 MED ORDER — VALACYCLOVIR HCL 1 G PO TABS: 1000.0000 mg | ORAL_TABLET | Freq: Every day | ORAL | 1 refills | Status: DC

## 2024-01-18 MED ORDER — ALBUTEROL SULFATE HFA 108 (90 BASE) MCG/ACT IN AERS: 2.0000 | INHALATION_SPRAY | RESPIRATORY_TRACT | 3 refills | Status: AC | PRN
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
## 2024-01-19 ENCOUNTER — Ambulatory Visit: Payer: Self-pay | Admitting: Physician Assistant

## 2024-01-19 LAB — COMPREHENSIVE METABOLIC PANEL WITH GFR
ALT: 15 IU/L (ref 0–32)
AST: 24 IU/L (ref 0–40)
Albumin: 4.9 g/dL (ref 3.8–4.9)
Alkaline Phosphatase: 78 IU/L (ref 44–121)
BUN/Creatinine Ratio: 16 (ref 9–23)
BUN: 15 mg/dL (ref 6–24)
Bilirubin Total: 0.6 mg/dL (ref 0.0–1.2)
CO2: 20 mmol/L (ref 20–29)
Calcium: 9.7 mg/dL (ref 8.7–10.2)
Chloride: 103 mmol/L (ref 96–106)
Creatinine, Ser: 0.92 mg/dL (ref 0.57–1.00)
Globulin, Total: 2.3 g/dL (ref 1.5–4.5)
Glucose: 83 mg/dL (ref 70–99)
Potassium: 4 mmol/L (ref 3.5–5.2)
Sodium: 142 mmol/L (ref 134–144)
Total Protein: 7.2 g/dL (ref 6.0–8.5)
eGFR: 73 mL/min/1.73 (ref >59)

## 2024-01-19 LAB — CBC WITH DIFFERENTIAL/PLATELET
Basophils Absolute: 0.1 x10E3/uL (ref 0.0–0.2)
Basos: 2 %
EOS (ABSOLUTE): 0.6 x10E3/uL — ABNORMAL HIGH (ref 0.0–0.4)
Eos: 9 %
Hematocrit: 41.8 % (ref 34.0–46.6)
Hemoglobin: 13.7 g/dL (ref 11.1–15.9)
Immature Grans (Abs): 0 x10E3/uL (ref 0.0–0.1)
Immature Granulocytes: 0 %
Lymphocytes Absolute: 1.6 x10E3/uL (ref 0.7–3.1)
Lymphs: 24 %
MCH: 29.2 pg (ref 26.6–33.0)
MCHC: 32.8 g/dL (ref 31.5–35.7)
MCV: 89 fL (ref 79–97)
Monocytes Absolute: 0.7 x10E3/uL (ref 0.1–0.9)
Monocytes: 11 %
Neutrophils Absolute: 3.6 x10E3/uL (ref 1.4–7.0)
Neutrophils: 53 %
Platelets: 246 x10E3/uL (ref 150–450)
RBC: 4.69 x10E6/uL (ref 3.77–5.28)
RDW: 14 % (ref 11.7–15.4)
WBC: 6.6 x10E3/uL (ref 3.4–10.8)

## 2024-01-19 LAB — IRON,TIBC AND FERRITIN PANEL
Ferritin: 174 ng/mL — ABNORMAL HIGH (ref 15–150)
Iron Saturation: 24 % (ref 15–55)
Iron: 67 ug/dL (ref 27–159)
Total Iron Binding Capacity: 275 ug/dL (ref 250–450)
UIBC: 208 ug/dL (ref 131–425)

## 2024-01-19 LAB — TSH: TSH: 1.16 u[IU]/mL (ref 0.450–4.500)

## 2024-01-20 DIAGNOSIS — F3175 Bipolar disorder, in partial remission, most recent episode depressed: Secondary | ICD-10-CM | POA: Diagnosis not present

## 2024-01-27 ENCOUNTER — Ambulatory Visit (INDEPENDENT_AMBULATORY_CARE_PROVIDER_SITE_OTHER)

## 2024-01-27 ENCOUNTER — Ambulatory Visit: Payer: Self-pay | Admitting: Physician Assistant

## 2024-01-27 DIAGNOSIS — D508 Other iron deficiency anemias: Secondary | ICD-10-CM

## 2024-01-27 LAB — POC HEMOCCULT BLD/STL (HOME/3-CARD/SCREEN)
Card #2 Fecal Occult Blod, POC: NEGATIVE
Card #3 Fecal Occult Blood, POC: NEGATIVE
Fecal Occult Blood, POC: NEGATIVE

## 2024-01-27 NOTE — Progress Notes (Signed)
 Patient is in office today for a nurse visit for Hemoccult Card. Patient Hemoccult Card results were sent to provider.

## 2024-02-03 DIAGNOSIS — M1711 Unilateral primary osteoarthritis, right knee: Secondary | ICD-10-CM | POA: Diagnosis not present

## 2024-02-03 DIAGNOSIS — F3175 Bipolar disorder, in partial remission, most recent episode depressed: Secondary | ICD-10-CM | POA: Diagnosis not present

## 2024-02-03 DIAGNOSIS — M47816 Spondylosis without myelopathy or radiculopathy, lumbar region: Secondary | ICD-10-CM | POA: Diagnosis not present

## 2024-02-03 DIAGNOSIS — G894 Chronic pain syndrome: Secondary | ICD-10-CM | POA: Diagnosis not present

## 2024-02-03 DIAGNOSIS — M62830 Muscle spasm of back: Secondary | ICD-10-CM | POA: Diagnosis not present

## 2024-02-03 NOTE — Progress Notes (Unsigned)
   LILLETTE Ileana Collet, PhD, LAT, ATC acting as a scribe for Artist Lloyd, MD.  [name] is a 58 y.o. female who presents to [facility] today for cont'd L shoulder pain. Pt was last seen by Dr. Lloyd on 11/25/23 and was given a L subacromial steroid injection.  Today, pt reports ***  Dx testing: 04/22/23 C-spine CT  11/10/22 C-spine & L shoulder XR  Pertinent review of systems: ***  Relevant historical information: ***   Exam:  LMP 08/22/2016 (Approximate)  General: Well Developed, well nourished, and in no acute distress.   MSK: ***    Lab and Radiology Results No results found for this or any previous visit (from the past 72 hours). No results found.     Assessment and Plan: 58 y.o. female with ***   PDMP not reviewed this encounter. No orders of the defined types were placed in this encounter.  No orders of the defined types were placed in this encounter.    Discussed warning signs or symptoms. Please see discharge instructions. Patient expresses understanding.   ***

## 2024-02-04 ENCOUNTER — Ambulatory Visit (INDEPENDENT_AMBULATORY_CARE_PROVIDER_SITE_OTHER)

## 2024-02-04 ENCOUNTER — Ambulatory Visit: Admitting: Family Medicine

## 2024-02-04 ENCOUNTER — Encounter: Payer: Self-pay | Admitting: Family Medicine

## 2024-02-04 ENCOUNTER — Other Ambulatory Visit: Payer: Self-pay

## 2024-02-04 VITALS — BP 112/72 | HR 73 | Ht 67.0 in

## 2024-02-04 DIAGNOSIS — M25512 Pain in left shoulder: Secondary | ICD-10-CM | POA: Diagnosis not present

## 2024-02-04 DIAGNOSIS — G8929 Other chronic pain: Secondary | ICD-10-CM

## 2024-02-04 DIAGNOSIS — M19012 Primary osteoarthritis, left shoulder: Secondary | ICD-10-CM | POA: Diagnosis not present

## 2024-02-04 NOTE — Patient Instructions (Addendum)
 Thank you for coming in today.   You received an injection today. Seek immediate medical attention if the joint becomes red, extremely painful, or is oozing fluid.   Please get an Xray today before you leave   See you back as needed.

## 2024-02-08 ENCOUNTER — Ambulatory Visit: Admitting: Orthopedic Surgery

## 2024-02-10 ENCOUNTER — Encounter: Payer: Self-pay | Admitting: General Surgery

## 2024-02-10 ENCOUNTER — Other Ambulatory Visit: Payer: Self-pay | Admitting: General Surgery

## 2024-02-10 DIAGNOSIS — M5459 Other low back pain: Secondary | ICD-10-CM | POA: Diagnosis not present

## 2024-02-10 DIAGNOSIS — Z903 Acquired absence of stomach [part of]: Secondary | ICD-10-CM

## 2024-02-10 DIAGNOSIS — K219 Gastro-esophageal reflux disease without esophagitis: Secondary | ICD-10-CM

## 2024-02-10 DIAGNOSIS — L658 Other specified nonscarring hair loss: Secondary | ICD-10-CM | POA: Diagnosis not present

## 2024-02-10 DIAGNOSIS — K5909 Other constipation: Secondary | ICD-10-CM | POA: Diagnosis not present

## 2024-02-10 DIAGNOSIS — R1012 Left upper quadrant pain: Secondary | ICD-10-CM | POA: Diagnosis not present

## 2024-02-10 DIAGNOSIS — F411 Generalized anxiety disorder: Secondary | ICD-10-CM | POA: Diagnosis not present

## 2024-02-10 DIAGNOSIS — Z9884 Bariatric surgery status: Secondary | ICD-10-CM | POA: Diagnosis not present

## 2024-02-10 DIAGNOSIS — G8929 Other chronic pain: Secondary | ICD-10-CM | POA: Diagnosis not present

## 2024-02-10 DIAGNOSIS — J45909 Unspecified asthma, uncomplicated: Secondary | ICD-10-CM | POA: Diagnosis not present

## 2024-02-10 DIAGNOSIS — F410 Panic disorder [episodic paroxysmal anxiety] without agoraphobia: Secondary | ICD-10-CM | POA: Diagnosis not present

## 2024-02-10 DIAGNOSIS — G43711 Chronic migraine without aura, intractable, with status migrainosus: Secondary | ICD-10-CM | POA: Diagnosis not present

## 2024-02-10 DIAGNOSIS — F3170 Bipolar disorder, currently in remission, most recent episode unspecified: Secondary | ICD-10-CM | POA: Diagnosis not present

## 2024-02-11 ENCOUNTER — Telehealth: Payer: Self-pay | Admitting: Orthopaedic Surgery

## 2024-02-11 NOTE — Telephone Encounter (Signed)
 Patient seen last on 12-28-23 right knee.  She was told to think about about RIGHT TOTAL KNEE REPLACEMENT.  She is ready and would like to shoot for the end of September.  Please provide surgery sheet if surgery is in order.

## 2024-02-15 ENCOUNTER — Ambulatory Visit: Admitting: Orthopedic Surgery

## 2024-02-15 DIAGNOSIS — M654 Radial styloid tenosynovitis [de Quervain]: Secondary | ICD-10-CM | POA: Diagnosis not present

## 2024-02-15 MED ORDER — LIDOCAINE HCL 1 % IJ SOLN
1.0000 mL | INTRAMUSCULAR | Status: AC | PRN
  Administered 2024-02-15 (×2): 1 mL

## 2024-02-15 MED ORDER — BETAMETHASONE SOD PHOS & ACET 6 (3-3) MG/ML IJ SUSP
6.0000 mg | INTRAMUSCULAR | Status: AC | PRN
  Administered 2024-02-15 (×2): 6 mg via INTRA_ARTICULAR

## 2024-02-15 NOTE — Progress Notes (Signed)
 [name] - 58 y.o. female MRN [id number]  Date of birth: 09-10-1965  Office Visit Note: Visit Date: 02/15/2024 PCP: Nicholaus Credit, PA-C Referred by: Nicholaus Credit, PA-C  Subjective: No chief complaint on file.  HPI: [name] is a pleasant 58 y.o. female who returns today for repeat evaluation of pain along the radial aspect of the right wrist with radiation into the thumb and proximally into the forearm.  At her recent visit approximately 6 weeks ago, she was placed into a Comfort Cool brace at that time for ongoing de Quervain's tenosynovitis.  No injection was given at that time.  She states she has been utilizing the brace without significant relief, pain distribution is relatively the same  Pertinent ROS were reviewed with the patient and found to be negative unless otherwise specified above in HPI.    Assessment & Plan: Visit Diagnoses:  1. De Quervain's tenosynovitis, right      Plan: Extensive discussion was had with the patient today regarding her right wrist radial sided pain.  She continues to have signs and symptoms consistent with de Quervain's tenosynovitis.  We discussed the underlying etiology and pathophysiology of this condition as well as treatment modalities ranging from conservative to surgical.  From a conservative standpoint, we discussed activity modification, anti-inflammatory medication with topical and oral, bracing, therapy and cortisone injections.  From a surgical standpoint, I explained to her that we can perform right wrist first extensor compartment release should symptoms remain affected conservative care.  At this juncture, given her symptoms persistent to conservative care with bracing, she would like to pursue cortisone injection to the right wrist first extensor compartment.  Risks and benefits of the injection were discussed in detail, patient elected to proceed.  Injection was tolerated well without incident. Follow-up in approximate 6  weeks.   I spent 45 minutes in the care of this patient today including review of previous documentation, imaging obtained, face-to-face time discussing all options regarding treatment and documenting the encounter.   Follow-up: No follow-ups on file.   Meds & Orders: No orders of the defined types were placed in this encounter.   No orders of the defined types were placed in this encounter.    Procedures: Hand/UE Inj: R extensor compartment 1 for de Quervain's tenosynovitis on 02/15/2024 12:47 PM Indications: pain Details: 22 G needle Medications: 1 mL lidocaine  1 %; 6 mg betamethasone  acetate-betamethasone  sodium phosphate  6 (3-3) MG/ML Outcome: tolerated well, no immediate complications Procedure, treatment alternatives, risks and benefits explained, specific risks discussed. Consent was given by the patient.          Clinical History: No specialty comments available.  She reports that she has never smoked. She has never used smokeless tobacco. No results for input(s): HGBA1C, LABURIC in the last 8760 hours.  Objective:   Vital Signs: LMP 08/22/2016 (Approximate)   Physical Exam  Gen: Well-appearing, in no acute distress; non-toxic CV: Regular Rate. Well-perfused. Warm.  Resp: Breathing unlabored on room air; no wheezing. Psych: Fluid speech in conversation; appropriate affect; normal thought process  Ortho Exam PHYSICAL EXAM:  General: Patient is well appearing and in no distress.   Skin and Muscle: No significant skin changes are apparent to upper extremities.   Range of Motion and Palpation Tests: Mobility is full about the elbows with flexion and extension.  No evidence of nerve subluxation at left elbow.  Forearm supination and pronation are 85/85 bilaterally.  Wrist flexion/extension is 75/65 bilaterally.  Digital  flexion and extension are full.  Thumb opposition is full to the base of the small fingers bilaterally.    No cords or nodules are palpated.   No triggering is observed.    Notable tenderness over the right wrist radial styloid region.  Finklestein test is positive right.  No evidence of radiocarpal, midcarpal or intercarpal joint instability with provocation.    Lab Results  Component Value Date   HGBA1C 5.8 (H) 05/16/2019     Imaging: No results found.  Past Medical/Family/Surgical/Social History: Medications & Allergies reviewed per EMR, new medications updated. 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   Chronic migraine without aura, with intractable migraine, so stated, with status migrainosus 01/25/2023   History of psychiatric treatment 01/13/2023   Viral upper respiratory tract infection 03/28/2021   Episodic tension-type headache, not intractable 11/07/2020   Hypertrophy of inferior nasal turbinate 11/07/2020   Postnasal drip 11/07/2020   Lymphadenopathy, anterior cervical 07/13/2020   Sebaceous cyst 07/13/2020   Iron  deficiency anemia secondary to inadequate dietary iron  intake 01/12/2020   Constipation 12/13/2019   Hyperlipidemia LDL goal <130 09/08/2019   Positive colorectal cancer screening using Cologuard test 03/02/2018   Leukocytosis 01/26/2018   Onychomycosis of great toe 12/22/2017   Family history of DVT 03/20/2017   Unilateral primary osteoarthritis, right knee 05/13/2016   Dyslipidemia 03/04/2016   Right knee pain 03/03/2016   Pre-diabetes 11/01/2015   Allergic rhinitis 05/28/2015   Sleep state misperception 07/05/2014   BMI 33.0-33.9,adult 05/24/2013   Polycystic ovary disease 07/14/2012   Obstructive sleep apnea (adult)  (pediatric) 02/04/2012   Perimenopausal 11/10/2011   Hot flashes 11/10/2011   Disturbance in sleep behavior 09/16/2011   Asthma 05/23/2011   Backache 05/23/2011   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) 05/23/2011   Dysphagia 05/23/2011   Esophageal reflux 05/23/2011   Rhinitis, allergic 05/23/2011   Past Medical History:  Diagnosis Date   Allergy    Anxiety    Arthritis    Asthma    History of Asthma   Bipolar disorder (HCC)    Chest pain, atypical 11/30/2012   Clotting disorder (HCC)    Depression    Diabetes mellitus without complication (HCC)    type 2, pre-diabetic before she lost weight   GERD (gastroesophageal reflux disease)    H/O degenerative disc disease    L4-L5, L5-S1   Heart murmur    Hyperglycemia    Postoperative hyperglycemia   Hypertension    hx of   Neuromuscular disorder (HCC)    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   Obesity 07/14/2012   OSA (obstructive sleep apnea)    mild   Pneumonia    Polycystic disease, ovaries    Postoperative anemia    2018 - while dieting  hx of   Reflux    Sinus tachycardia 07/14/2012   Pt says HR > 100 is consistent   Sleep apnea    has had two tests, different results- no CPAP used   Family History  Problem Relation Age of Onset   Cancer Father        Leukemia   Diabetes  Father    Heart disease Father        first PCI in his 36s   Diabetes Mother    Cancer Maternal Grandmother        Stomach   Colon cancer Maternal Grandmother    Cancer Paternal Grandmother    Cancer Paternal Aunt    Diabetes Paternal Aunt    Cancer Paternal Uncle    Cancer Paternal Aunt    Rectal cancer Neg Hx    Stomach cancer Neg Hx    Esophageal cancer Neg Hx    Past Surgical History:  Procedure Laterality Date   COLONOSCOPY     FOOT SURGERY     Right plantar facsiitis scrape   KNEE SURGERY Bilateral 2007   LAPAROSCOPIC GASTRIC SLEEVE RESECTION N/A 03/30/2017   Procedure: LAPAROSCOPIC GASTRIC SLEEVE RESECTION,  UPPER ENDO;  Surgeon: Tanda Locus, MD;  Location: [facility];  Service: General;  Laterality: N/A;   NASAL SINUS SURGERY  2004   OVARIAN CYST REMOVAL  1998   A cyst on the fallopian tube removed   SPINAL FUSION     TONSILLECTOMY  1996   Social History   Occupational History   Occupation: disabled    Associate Professor: UNEMPLOYED  Tobacco Use   Smoking status: Never   Smokeless tobacco: Never  Vaping Use   Vaping status: Never Used  Substance and Sexual Activity   Alcohol use: No   Drug use: No   Sexual activity: Yes    Birth control/protection: None    Maiah Sinning Estela) Arlinda, M.D. [facility], Hand Surgery

## 2024-02-18 DIAGNOSIS — F3175 Bipolar disorder, in partial remission, most recent episode depressed: Secondary | ICD-10-CM | POA: Diagnosis not present

## 2024-02-19 NOTE — Telephone Encounter (Signed)
 Patient was considering knee replacement per last note about pain in her knee/s. A referral to surgeon was discussed. Patient does not have an appointment for knee with our office scheduled. Will hold off re-running for this medication until requested.

## 2024-02-22 ENCOUNTER — Encounter: Payer: Self-pay | Admitting: Orthopaedic Surgery

## 2024-03-01 ENCOUNTER — Encounter: Payer: Self-pay | Admitting: Hematology and Oncology

## 2024-03-01 ENCOUNTER — Other Ambulatory Visit: Payer: Self-pay | Admitting: Physician Assistant

## 2024-03-01 DIAGNOSIS — Z01818 Encounter for other preprocedural examination: Secondary | ICD-10-CM

## 2024-03-03 DIAGNOSIS — M47816 Spondylosis without myelopathy or radiculopathy, lumbar region: Secondary | ICD-10-CM | POA: Diagnosis not present

## 2024-03-03 DIAGNOSIS — M62830 Muscle spasm of back: Secondary | ICD-10-CM | POA: Diagnosis not present

## 2024-03-03 DIAGNOSIS — G894 Chronic pain syndrome: Secondary | ICD-10-CM | POA: Diagnosis not present

## 2024-03-03 DIAGNOSIS — F3175 Bipolar disorder, in partial remission, most recent episode depressed: Secondary | ICD-10-CM | POA: Diagnosis not present

## 2024-03-03 DIAGNOSIS — M1711 Unilateral primary osteoarthritis, right knee: Secondary | ICD-10-CM | POA: Diagnosis not present

## 2024-03-04 ENCOUNTER — Ambulatory Visit: Payer: Self-pay | Admitting: Family Medicine

## 2024-03-04 NOTE — Progress Notes (Signed)
 Left shoulder x-ray does show some arthritis of the small joint at the top of the shoulder.

## 2024-03-18 DIAGNOSIS — F3175 Bipolar disorder, in partial remission, most recent episode depressed: Secondary | ICD-10-CM | POA: Diagnosis not present

## 2024-03-22 DIAGNOSIS — M47816 Spondylosis without myelopathy or radiculopathy, lumbar region: Secondary | ICD-10-CM | POA: Diagnosis not present

## 2024-03-22 DIAGNOSIS — Z79891 Long term (current) use of opiate analgesic: Secondary | ICD-10-CM | POA: Diagnosis not present

## 2024-03-22 DIAGNOSIS — M62830 Muscle spasm of back: Secondary | ICD-10-CM | POA: Diagnosis not present

## 2024-03-22 DIAGNOSIS — G894 Chronic pain syndrome: Secondary | ICD-10-CM | POA: Diagnosis not present

## 2024-03-22 DIAGNOSIS — M1711 Unilateral primary osteoarthritis, right knee: Secondary | ICD-10-CM | POA: Diagnosis not present

## 2024-03-23 DIAGNOSIS — F3175 Bipolar disorder, in partial remission, most recent episode depressed: Secondary | ICD-10-CM | POA: Diagnosis not present

## 2024-03-24 ENCOUNTER — Ambulatory Visit
Admission: RE | Admit: 2024-03-24 | Discharge: 2024-03-24 | Disposition: A | Discharge status: Home / Self Care | Source: Ambulatory Visit | Attending: General Surgery | Admitting: General Surgery

## 2024-03-24 DIAGNOSIS — K219 Gastro-esophageal reflux disease without esophagitis: Secondary | ICD-10-CM

## 2024-03-24 DIAGNOSIS — R1310 Dysphagia, unspecified: Secondary | ICD-10-CM | POA: Diagnosis not present

## 2024-03-24 DIAGNOSIS — Z903 Acquired absence of stomach [part of]: Secondary | ICD-10-CM

## 2024-03-24 NOTE — Progress Notes (Signed)
 Surgical Instructions   Your procedure is scheduled on Tuesday April 05, 2024. Report to [facility] Main Entrance A at 8:15 A.M., then check in with the Admitting office. Any questions or running late day of surgery: call [phone]  Questions prior to your surgery date: call [phone], Monday-Friday, 8am-4pm. If you experience any cold or flu symptoms such as cough, fever, chills, shortness of breath, etc. between now and your scheduled surgery, please notify us  at the above number.     Remember:  Do not eat after midnight the night before your surgery  You may drink clear liquids until 7:15 the morning of your surgery.   Clear liquids allowed are: Water , Non-Citrus Juices (without pulp), Carbonated Beverages, Clear Tea (no milk, honey, etc.), Black Coffee Only (NO MILK, CREAM OR POWDERED CREAMER of any kind), and Gatorade.   Patient Instructions  The night before surgery:  No food after midnight. ONLY clear liquids after midnight   The day of surgery (if you have diabetes): Drink ONE (1) 12 oz G2 given to you in your pre admission testing appointment by 7:15 the morning of surgery. Drink in one sitting. Do not sip.  This drink was given to you during your hospital  pre-op appointment visit.  Nothing else to drink after completing the  12 oz bottle of G2.         If you have questions, please contact your surgeon's office.  Take these medicines the morning of surgery with A SIP OF WATER   ALPRAZolam (XANAX)  buPROPion (WELLBUTRIN XL)  escitalopram  (LEXAPRO )  estradiol  (ESTRACE )  TRELEGY ELLIPTA  HYDROmorphone  (DILAUDID )  medroxyPROGESTERone  (PROVERA )  pantoprazole  (PROTONIX )  tiZANidine (ZANAFLEX)  valACYclovir  (VALTREX )   May take these medicines IF NEEDED: albuterol  (VENTOLIN  HFA) inhaler MAY BRING WITH YOU EPINEPHrine   injection  fluticasone  (FLONASE )  naloxone (NARCAN)  Rimegepant Sulfate (NURTEC)    One week prior to surgery, STOP taking any Aspirin  (unless otherwise instructed by your surgeon) Aleve, Naproxen, Ibuprofen, Motrin, Advil, Goody's, BC's, all herbal medications, fish oil, and non-prescription vitamins.                     Do NOT Smoke (Tobacco/Vaping) for 24 hours prior to your procedure.  If you use a CPAP at night, you may bring your mask/headgear for your overnight stay.   You will be asked to remove any contacts, glasses, piercing's, hearing aid's, dentures/partials prior to surgery. Please bring cases for these items if needed.    Patients discharged the day of surgery will not be allowed to drive home, and someone needs to stay with them for 24 hours.  SURGICAL WAITING ROOM VISITATION Patients may have no more than 2 support people in the waiting area - these visitors may rotate.   Pre-op nurse will coordinate an appropriate time for 1 ADULT support person, who may not rotate, to accompany patient in pre-op.  Children under the age of 38 must have an adult with them who is not the patient and must remain in the main waiting area with an adult.  If the patient needs to stay at the hospital during part of their recovery, the visitor guidelines for inpatient rooms apply.  Please refer to the [facility] website for the visitor guidelines for any additional information.   If you received a COVID test during your pre-op visit  it is requested that you wear a mask when out in public, stay away from anyone that may not be feeling well  and notify your surgeon if you develop symptoms. If you have been in contact with anyone that has tested positive in the last 10 days please notify you surgeon.      Pre-operative 5 CHG Bathing Instructions   You can play a key role in reducing the risk of infection after surgery. Your skin needs to be as free of germs as possible. You can reduce the number of germs on your skin by washing with CHG (chlorhexidine  gluconate) soap before surgery. CHG is an antiseptic soap that kills germs and  continues to kill germs even after washing.   DO NOT use if you have an allergy to chlorhexidine /CHG or antibacterial soaps. If your skin becomes reddened or irritated, stop using the CHG and notify one of our RNs at [phone].   Please shower with the CHG soap starting 4 days before surgery using the following schedule:     Please keep in mind the following:  DO NOT shave, including legs and underarms, starting the day of your first shower.   You may shave your face at any point before/day of surgery.  Place clean sheets on your bed the day you start using CHG soap. Use a clean washcloth (not used since being washed) for each shower. DO NOT sleep with pets once you start using the CHG.   CHG Shower Instructions:  Wash your face and private area with normal soap. If you choose to wash your hair, wash first with your normal shampoo.  After you use shampoo/soap, rinse your hair and body thoroughly to remove shampoo/soap residue.  Turn the water  OFF and apply about 3 tablespoons (45 ml) of CHG soap to a CLEAN washcloth.  Apply CHG soap ONLY FROM YOUR NECK DOWN TO YOUR TOES (washing for 3-5 minutes)  DO NOT use CHG soap on face, private areas, open wounds, or sores.  Pay special attention to the area where your surgery is being performed.  If you are having back surgery, having someone wash your back for you may be helpful. Wait 2 minutes after CHG soap is applied, then you may rinse off the CHG soap.  Pat dry with a clean towel  Put on clean clothes/pajamas   If you choose to wear lotion, please use ONLY the CHG-compatible lotions that are listed below.  Additional instructions for the day of surgery: DO NOT APPLY any lotions, deodorants, cologne, or perfumes.   Do not bring valuables to the hospital. [facility] is not responsible for any belongings/valuables. Do not wear nail polish, gel polish, artificial nails, or any other type of covering on natural nails (fingers and toes) Do  not wear jewelry or makeup Put on clean/comfortable clothes.  Please brush your teeth.  Ask your nurse before applying any prescription medications to the skin.     CHG Compatible Lotions   Aveeno Moisturizing lotion  Cetaphil Moisturizing Cream  Cetaphil Moisturizing Lotion  Clairol Herbal Essence Moisturizing Lotion, Dry Skin  Clairol Herbal Essence Moisturizing Lotion, Extra Dry Skin  Clairol Herbal Essence Moisturizing Lotion, Normal Skin  Curel Age Defying Therapeutic Moisturizing Lotion with Alpha Hydroxy  Curel Extreme Care Body Lotion  Curel Soothing Hands Moisturizing Hand Lotion  Curel Therapeutic Moisturizing Cream, Fragrance-Free  Curel Therapeutic Moisturizing Lotion, Fragrance-Free  Curel Therapeutic Moisturizing Lotion, Original Formula  Eucerin Daily Replenishing Lotion  Eucerin Dry Skin Therapy Plus Alpha Hydroxy Crme  Eucerin Dry Skin Therapy Plus Alpha Hydroxy Lotion  Eucerin Original Crme  Eucerin Original Lotion  Eucerin Plus  Crme Eucerin Plus Lotion  Eucerin TriLipid Replenishing Lotion  Keri Anti-Bacterial Hand Lotion  Keri Deep Conditioning Original Lotion Dry Skin Formula Softly Scented  Keri Deep Conditioning Original Lotion, Fragrance Free Sensitive Skin Formula  Keri Lotion Fast Absorbing Fragrance Free Sensitive Skin Formula  Keri Lotion Fast Absorbing Softly Scented Dry Skin Formula  Keri Original Lotion  Keri Skin Renewal Lotion Keri Silky Smooth Lotion  Keri Silky Smooth Sensitive Skin Lotion  Nivea Body Creamy Conditioning Oil  Nivea Body Extra Enriched Lotion  Nivea Body Original Lotion  Nivea Body Sheer Moisturizing Lotion Nivea Crme  Nivea Skin Firming Lotion  NutraDerm 30 Skin Lotion  NutraDerm Skin Lotion  NutraDerm Therapeutic Skin Cream  NutraDerm Therapeutic Skin Lotion  ProShield Protective Hand Cream  Provon moisturizing lotion  Please read over the following fact sheets that you were given.

## 2024-03-25 ENCOUNTER — Encounter (HOSPITAL_COMMUNITY)
Admission: RE | Admit: 2024-03-25 | Discharge: 2024-03-25 | Disposition: A | Discharge status: Home / Self Care | Source: Ambulatory Visit | Attending: Orthopaedic Surgery | Admitting: Orthopaedic Surgery

## 2024-03-25 ENCOUNTER — Encounter (HOSPITAL_COMMUNITY): Payer: Self-pay

## 2024-03-25 ENCOUNTER — Other Ambulatory Visit: Payer: Self-pay

## 2024-03-25 VITALS — BP 137/82 | HR 72 | Temp 98.6°F | Resp 17 | Ht 67.0 in | Wt 198.7 lb

## 2024-03-25 DIAGNOSIS — Z01818 Encounter for other preprocedural examination: Secondary | ICD-10-CM | POA: Insufficient documentation

## 2024-03-25 DIAGNOSIS — E119 Type 2 diabetes mellitus without complications: Secondary | ICD-10-CM | POA: Diagnosis not present

## 2024-03-25 HISTORY — DX: Headache, unspecified: R51.9

## 2024-03-25 LAB — SURGICAL PCR SCREEN
MRSA, PCR: NEGATIVE
Staphylococcus aureus: POSITIVE — AB

## 2024-03-25 LAB — BASIC METABOLIC PANEL WITH GFR
Anion gap: 9 (ref 5–15)
BUN: 9 mg/dL (ref 6–20)
CO2: 29 mmol/L (ref 22–32)
Calcium: 9.3 mg/dL (ref 8.9–10.3)
Chloride: 101 mmol/L (ref 98–111)
Creatinine, Ser: 0.91 mg/dL (ref 0.44–1.00)
GFR, Estimated: >60 mL/min (ref >60)
Glucose, Bld: 91 mg/dL (ref 70–99)
Potassium: 4.5 mmol/L (ref 3.5–5.1)
Sodium: 139 mmol/L (ref 135–145)

## 2024-03-25 LAB — CBC
HCT: 44.1 % (ref 36.0–46.0)
Hemoglobin: 14.2 g/dL (ref 12.0–15.0)
MCH: 30.7 pg (ref 26.0–34.0)
MCHC: 32.2 g/dL (ref 30.0–36.0)
MCV: 95.5 fL (ref 80.0–100.0)
Platelets: 255 K/uL (ref 150–400)
RBC: 4.62 MIL/uL (ref 3.87–5.11)
RDW: 13.8 % (ref 11.5–15.5)
WBC: 6.5 K/uL (ref 4.0–10.5)
nRBC: 0 % (ref 0.0–0.2)

## 2024-03-25 LAB — HEMOGLOBIN A1C
Hgb A1c MFr Bld: 4.8 % (ref 4.8–5.6)
Mean Plasma Glucose: 91.06 mg/dL

## 2024-03-25 LAB — GLUCOSE, CAPILLARY: Glucose-Capillary: 94 mg/dL (ref 70–99)

## 2024-03-25 NOTE — Progress Notes (Signed)
 PCP - Camie Moats PA-C Cardiologist - denies  PPM/ICD - denies Device Orders -  Rep Notified -   Chest x-ray - na EKG - 03/25/24 Stress Test - 09/22/15 ECHO - 07/22/12 Cardiac Cath - denies  Sleep Study - was diagnosed with sleep apnea prior to weight loss CPAP - no  Fasting Blood Sugar - patient denies diabetes since gastric sleeve surgery. Doesn't check blood sugar at home. Checks Blood Sugar _____ times a day  Last dose of GLP1 agonist-  na GLP1 instructions: na  Blood Thinner Instructions:na Aspirin Instructions: na  ERAS Protcol - clear liquids until 7:15 PRE-SURGERY Ensure or G2- G2  COVID TEST- na   Anesthesia review: no  Patient denies shortness of breath, fever, cough and chest pain at PAT appointment   All instructions explained to the patient, with a verbal understanding of the material. Patient agrees to go over the instructions while at home for a better understanding. The opportunity to ask questions was provided.

## 2024-03-26 LAB — TYPE AND SCREEN
ABO/RH(D): O NEG
Antibody Screen: NEGATIVE

## 2024-03-28 ENCOUNTER — Ambulatory Visit (INDEPENDENT_AMBULATORY_CARE_PROVIDER_SITE_OTHER): Admitting: Orthopedic Surgery

## 2024-03-28 DIAGNOSIS — M654 Radial styloid tenosynovitis [de Quervain]: Secondary | ICD-10-CM | POA: Diagnosis not present

## 2024-03-28 NOTE — Progress Notes (Signed)
 [name] - 58 y.o. female MRN [id number]  Date of birth: [birth year]  Office Visit Note: Visit Date: 03/28/2024 PCP: Nicholaus Credit, PA-C Referred by: Nicholaus Credit, PA-C  Subjective: No chief complaint on file.  HPI: [name] is a pleasant 58 y.o. female who returns today for repeat evaluation of pain along the radial aspect of the right wrist with radiation into the thumb and proximally into the forearm.  She has undergone prior bracing and injection approximate 6 weeks prior with relief, states that her pain has recurred recently in a similar fashion.  She does have an upcoming knee replacement with Dr. Vernetta next week.  Pertinent ROS were reviewed with the patient and found to be negative unless otherwise specified above in HPI.    Assessment & Plan: Visit Diagnoses:  1. De Quervain's tenosynovitis, right       Plan: Extensive discussion was had with the patient today regarding her right wrist radial sided pain.  She continues to have signs and symptoms consistent with de Quervain's tenosynovitis.  We discussed the underlying etiology and pathophysiology of this condition as well as treatment modalities ranging from conservative to surgical.  From a conservative standpoint, we discussed activity modification, anti-inflammatory medication with topical and oral, bracing, therapy and cortisone injections.  From a surgical standpoint, I explained to her that we can perform right wrist first extensor compartment release should symptoms remain affected conservative care.  Given that she has an upcoming knee replacement surgery, we will allow that process to unfold from a surgical and recovery standpoint before intervention at the right wrist.  New Comfort Cool brace was given today for ongoing symptom management.  I did explain that we could also perform repeat cortisone injection in the future should symptoms continue to persist versus potential first extensor compartment  release.    Follow-up: No follow-ups on file.   Meds & Orders: No orders of the defined types were placed in this encounter.   No orders of the defined types were placed in this encounter.    Procedures: No procedures performed      Clinical History: No specialty comments available.  She reports that she has never smoked. She has never used smokeless tobacco.  Recent Labs    03/25/24 1124  HGBA1C 4.8    Objective:   Vital Signs: LMP 08/22/2016 (Approximate)   Physical Exam  Gen: Well-appearing, in no acute distress; non-toxic CV: Regular Rate. Well-perfused. Warm.  Resp: Breathing unlabored on room air; no wheezing. Psych: Fluid speech in conversation; appropriate affect; normal thought process  Ortho Exam PHYSICAL EXAM:  General: Patient is well appearing and in no distress.   Skin and Muscle: No significant skin changes are apparent to upper extremities.   Range of Motion and Palpation Tests: Mobility is full about the elbows with flexion and extension.  No evidence of nerve subluxation at left elbow.  Forearm supination and pronation are 85/85 bilaterally.  Wrist flexion/extension is 75/65 bilaterally.  Digital flexion and extension are full.  Thumb opposition is full to the base of the small fingers bilaterally.    No cords or nodules are palpated.  No triggering is observed.    Notable tenderness over the right wrist radial styloid region.  Finklestein test is positive right.  No evidence of radiocarpal, midcarpal or intercarpal joint instability with provocation.    Lab Results  Component Value Date   HGBA1C 4.8 03/25/2024     Imaging: No results found.  Past Medical/Family/Surgical/Social History: Medications & Allergies reviewed per EMR, new medications updated. 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   Chronic migraine without aura, with intractable migraine, so stated, with status migrainosus 01/25/2023   History of psychiatric treatment 01/13/2023   Viral upper respiratory tract infection 03/28/2021   Episodic tension-type headache, not intractable 11/07/2020   Hypertrophy of inferior nasal turbinate 11/07/2020   Postnasal drip 11/07/2020   Lymphadenopathy, anterior cervical 07/13/2020   Sebaceous cyst 07/13/2020   Iron  deficiency anemia secondary to inadequate dietary iron  intake 01/12/2020   Constipation 12/13/2019   Hyperlipidemia LDL goal <130 09/08/2019   Positive colorectal cancer screening using Cologuard test 03/02/2018   Leukocytosis 01/26/2018   Onychomycosis of great toe 12/22/2017   Family history of DVT 03/20/2017   Unilateral primary osteoarthritis, right knee 05/13/2016   Dyslipidemia 03/04/2016   Right knee pain 03/03/2016   Pre-diabetes 11/01/2015   Allergic rhinitis 05/28/2015   Sleep state misperception 07/05/2014   BMI 33.0-33.9,adult 05/24/2013   Polycystic ovary disease 07/14/2012   Obstructive sleep apnea (adult) (pediatric) 02/04/2012   Perimenopausal 11/10/2011   Hot flashes 11/10/2011   Disturbance in sleep behavior 09/16/2011   Asthma 05/23/2011   Backache 05/23/2011   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) 05/23/2011   Dysphagia 05/23/2011   Esophageal reflux 05/23/2011   Rhinitis, allergic 05/23/2011   Past Medical History:  Diagnosis Date   Allergy    Anxiety    Arthritis    Asthma    History of Asthma   Bipolar disorder (HCC)    Chest pain, atypical 11/30/2012   Clotting disorder (HCC)    Depression    Diabetes mellitus without complication (HCC)    type 2, pre-diabetic before she lost weight    GERD (gastroesophageal reflux disease)    H/O degenerative disc disease    L4-L5, L5-S1   Headache    Heart murmur    Hyperglycemia    Postoperative hyperglycemia   Hypertension    hx of   Neuromuscular disorder (HCC)    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   Obesity 07/14/2012   OSA (obstructive sleep apnea)    mild   Pneumonia    Polycystic disease, ovaries    Postoperative anemia    2018 - while dieting  hx of   Reflux    Sinus tachycardia 07/14/2012   Pt says HR > 100 is consistent   Sleep apnea    has had two tests, different results- no CPAP used   Family History  Problem Relation Age of Onset   Cancer Father        Leukemia   Diabetes Father    Heart disease Father        first PCI in his 54s   Diabetes Mother    Cancer Maternal Grandmother        Stomach   Colon cancer Maternal Grandmother    Cancer Paternal Grandmother    Cancer Paternal Aunt    Diabetes Paternal Aunt    Cancer Paternal Uncle    Cancer Paternal Aunt    Rectal cancer Neg Hx  Stomach cancer Neg Hx    Esophageal cancer Neg Hx    Past Surgical History:  Procedure Laterality Date   COLONOSCOPY     FOOT SURGERY Left    Right plantar facsiitis scrape   KNEE SURGERY Bilateral 2007   Tendon release   LAPAROSCOPIC GASTRIC SLEEVE RESECTION N/A 03/30/2017   Procedure: LAPAROSCOPIC GASTRIC SLEEVE RESECTION, UPPER ENDO;  Surgeon: Tanda Locus, MD;  Location: [facility];  Service: General;  Laterality: N/A;   NASAL SINUS SURGERY  2004   OVARIAN CYST REMOVAL  1998   A cyst on the fallopian tube removed   SPINAL FUSION     TONSILLECTOMY  1996   Social History   Occupational History   Occupation: disabled    Associate Professor: UNEMPLOYED  Tobacco Use   Smoking status: Never   Smokeless tobacco: Never  Vaping Use   Vaping status: Never Used  Substance and Sexual Activity   Alcohol use: No   Drug use: No   Sexual activity: Yes    Birth control/protection: None    Kollen Armenti Estela) Arlinda,  M.D. [facility], Hand Surgery

## 2024-03-31 DIAGNOSIS — F3175 Bipolar disorder, in partial remission, most recent episode depressed: Secondary | ICD-10-CM | POA: Diagnosis not present

## 2024-04-04 NOTE — H&P (Signed)
 TOTAL KNEE ADMISSION H&P  Patient is being admitted for right total knee arthroplasty.  Subjective:  Chief Complaint:right knee pain.  HPI: [name], 58 y.o. female, has a history of pain and functional disability in the right knee due to arthritis and has failed non-surgical conservative treatments for greater than 12 weeks to includeNSAID's and/or analgesics, corticosteriod injections, viscosupplementation injections, flexibility and strengthening excercises, use of assistive devices, weight reduction as appropriate, and activity modification.  Onset of symptoms was gradual, starting several years ago with gradually worsening course since that time. The patient noted prior procedures on the knee to include  arthroscopy on the right knee(s).  Patient currently rates pain in the right knee(s) at 10 out of 10 with activity. Patient has night pain, worsening of pain with activity and weight bearing, pain that interferes with activities of daily living, pain with passive range of motion, crepitus, and joint swelling.  Patient has evidence of subchondral sclerosis, periarticular osteophytes, and joint space narrowing by imaging studies. There is no active infection.  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   Chronic migraine without aura, with intractable migraine, so stated, with status migrainosus 01/25/2023   History of psychiatric treatment 01/13/2023   Viral upper respiratory tract infection 03/28/2021   Episodic tension-type headache, not intractable 11/07/2020   Hypertrophy of inferior nasal turbinate 11/07/2020    Postnasal drip 11/07/2020   Lymphadenopathy, anterior cervical 07/13/2020   Sebaceous cyst 07/13/2020   Iron  deficiency anemia secondary to inadequate dietary iron  intake 01/12/2020   Constipation 12/13/2019   Hyperlipidemia LDL goal <130 09/08/2019   Positive colorectal cancer screening using Cologuard test 03/02/2018   Leukocytosis 01/26/2018   Onychomycosis of great toe 12/22/2017   Family history of DVT 03/20/2017   Unilateral primary osteoarthritis, right knee 05/13/2016   Dyslipidemia 03/04/2016   Right knee pain 03/03/2016   Pre-diabetes 11/01/2015   Allergic rhinitis 05/28/2015   Sleep state misperception 07/05/2014   BMI 33.0-33.9,adult 05/24/2013   Polycystic ovary disease 07/14/2012   Obstructive sleep apnea (adult) (pediatric) 02/04/2012   Perimenopausal 11/10/2011   Hot flashes 11/10/2011   Disturbance in sleep behavior 09/16/2011   Asthma 05/23/2011   Backache 05/23/2011   Bipolar disorder in partial remission 05/23/2011   Dysphagia 05/23/2011   Esophageal reflux 05/23/2011   Rhinitis, allergic 05/23/2011   Past Medical History:  Diagnosis Date   Allergy    Anxiety    Arthritis    Asthma    History of Asthma   Bipolar disorder (HCC)    Chest pain, atypical 11/30/2012   Clotting disorder    Depression    Diabetes mellitus without complication (HCC)    type 2, pre-diabetic before she lost weight   GERD (gastroesophageal reflux disease)    H/O degenerative disc disease    L4-L5, L5-S1   Headache    Heart murmur    Hyperglycemia    Postoperative hyperglycemia   Hypertension    hx of   Neuromuscular disorder (HCC)    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   Obesity 07/14/2012   OSA (obstructive sleep apnea)    mild  Pneumonia    Polycystic disease, ovaries    Postoperative anemia    2018 - while dieting  hx of   Reflux    Sinus tachycardia 07/14/2012   Pt says HR > 100 is consistent   Sleep apnea    has had two tests, different  results- no CPAP used    Past Surgical History:  Procedure Laterality Date   COLONOSCOPY     FOOT SURGERY Left    Right plantar facsiitis scrape   KNEE SURGERY Bilateral 2007   Tendon release   LAPAROSCOPIC GASTRIC SLEEVE RESECTION N/A 03/30/2017   Procedure: LAPAROSCOPIC GASTRIC SLEEVE RESECTION, UPPER ENDO;  Surgeon: Tanda Locus, MD;  Location: [facility];  Service: General;  Laterality: N/A;   NASAL SINUS SURGERY  2004   OVARIAN CYST REMOVAL  1998   A cyst on the fallopian tube removed   SPINAL FUSION     TONSILLECTOMY  1996    No current facility-administered medications for this encounter.   Current Outpatient Medications  Medication Sig Dispense Refill Last Dose/Taking   albuterol  (VENTOLIN  HFA) 108 (90 Base) MCG/ACT inhaler Inhale 2 puffs into the lungs every 4 (four) hours as needed for wheezing or shortness of breath. 1 each 3 Taking As Needed   ALPRAZolam (XANAX XR) 1 MG 24 hr tablet Take 1 mg by mouth at bedtime.   Taking   ALPRAZolam (XANAX) 1 MG tablet Take 1 mg by mouth 2 (two) times daily.   Taking   Biotin 2500 MCG CAPS Take 2 each by mouth daily in the afternoon.   Taking   buPROPion (WELLBUTRIN XL) 150 MG 24 hr tablet Take 300 mg by mouth daily.   Taking   Docusate Sodium (DSS) 100 MG CAPS Take 100 mg by mouth daily as needed (CONSTIPATION).   Taking As Needed   EPINEPHrine  0.3 mg/0.3 mL IJ SOAJ injection Inject 0.3 mg into the muscle as needed for anaphylaxis. 1 each 1 Taking As Needed   escitalopram  (LEXAPRO ) 20 MG tablet Take 1 tablet (20 mg total) by mouth daily with breakfast. 30 tablet 3 Taking   estradiol  (ESTRACE ) 1 MG tablet Take 1 mg by mouth daily.   Taking   fentaNYL  (DURAGESIC ) 12 MCG/HR Place 1 patch onto the skin every 3 (three) days.   Taking   FIBER PO Take 2 each by mouth daily.   Taking   fluticasone  (FLONASE ) 50 MCG/ACT nasal spray Place 2 sprays into both nostrils daily. (Patient taking differently: Place 2 sprays into both nostrils daily as needed  for allergies.) 16 g 6 Taking Differently   Fluticasone -Umeclidin-Vilant (TRELEGY ELLIPTA) 200-62.5-25 MCG/ACT AEPB Inhale 2 puffs into the lungs daily.   Taking   Fremanezumab -vfrm (AJOVY ) 225 MG/1.5ML SOAJ Inject 225 mg into the skin every 30 (thirty) days. 1.5 mL 11 Taking   HUMIRA, 2 PEN, 40 MG/0.4ML pen Inject 40 mg into the skin every 14 (fourteen) days.   Taking   HYDROmorphone  (DILAUDID ) 2 MG tablet Take 1 tablet (2 mg total) by mouth every 6 (six) hours. 120 tablet 0 Taking   lamoTRIgine (LAMICTAL) 200 MG tablet Take 200 mg by mouth at bedtime.   Taking   linaclotide  (LINZESS ) 290 MCG CAPS capsule Take 290 mcg by mouth daily before breakfast.   Taking   medroxyPROGESTERone  (PROVERA ) 2.5 MG tablet Take 1 tablet (2.5 mg total) by mouth daily. 90 tablet 5 Taking   Melatonin 5 MG CHEW Chew 20 mg by mouth at bedtime.   Taking  methylphenidate 27 MG PO CR tablet Take 27 mg by mouth every morning.   Taking   Multiple Vitamins-Minerals (MULTIVITAMIN GUMMIES WOMENS PO) Take 2 each by mouth daily in the afternoon.   Taking   naloxone (NARCAN) nasal spray 4 mg/0.1 mL Place 1 spray into the nose once.   Taking   pantoprazole  (PROTONIX ) 40 MG tablet Take 1 tablet (40 mg total) by mouth daily. Office visit for further refills 90 tablet 0 Taking   Rimegepant Sulfate (NURTEC) 75 MG TBDP Take 1 tablet at the onset of a migraine.  Only 1 tablet in 24 hours 8 tablet 11 Taking   senna (SENOKOT) 8.6 MG tablet Take 2 tablets by mouth at bedtime as needed (CONSTIPATION).   Taking As Needed   temazepam (RESTORIL) 30 MG capsule Take 30 mg by mouth at bedtime.   Taking   tiZANidine (ZANAFLEX) 2 MG tablet Take 2 mg by mouth every 8 (eight) hours as needed for muscle spasms.   Taking As Needed   topiramate (TOPAMAX) 50 MG tablet Take 50 mg by mouth 2 (two) times daily.   Taking   traZODone  (DESYREL ) 100 MG tablet Take 300 mg by mouth at bedtime.   Taking   valACYclovir  (VALTREX ) 1000 MG tablet Take 1 tablet (1,000  mg total) by mouth daily. 90 tablet 1 Taking   Allergies  Allergen Reactions   Carbamazepine Swelling    Rash and tongue swelling   Duloxetine Other (See Comments) and Itching    Confusion/dizziness  Unknown reaction    Unknown reaction Unknown reaction    Confusion/dizziness   Erythromycin Swelling, Nausea And Vomiting and Other (See Comments)   Erythromycin Base Swelling   Gabapentin Nausea And Vomiting and Anaphylaxis   Pineapple Shortness Of Breath and Swelling    Throat swells   Pregabalin Swelling   Shellfish-Derived Products Anaphylaxis   Sulfa  Antibiotics Hives, Shortness Of Breath, Itching, Anxiety and Palpitations   Bee Pollen Other (See Comments)    Stuffy  Nose   Pollen Extract Other (See Comments)    Stuffy  Nose   Peanut (Diagnostic) Rash    Social History   Tobacco Use   Smoking status: Never   Smokeless tobacco: Never  Substance Use Topics   Alcohol use: No    Family History  Problem Relation Age of Onset   Cancer Father        Leukemia   Diabetes Father    Heart disease Father        first PCI in his 61s   Diabetes Mother    Cancer Maternal Grandmother        Stomach   Colon cancer Maternal Grandmother    Cancer Paternal Grandmother    Cancer Paternal Aunt    Diabetes Paternal Aunt    Cancer Paternal Uncle    Cancer Paternal Aunt    Rectal cancer Neg Hx    Stomach cancer Neg Hx    Esophageal cancer Neg Hx      Review of Systems  Objective:  Physical Exam Vitals reviewed.  Constitutional:      Appearance: Normal appearance. She is normal weight.  HENT:     Head: Normocephalic and atraumatic.  Eyes:     Extraocular Movements: Extraocular movements intact.     Pupils: Pupils are equal, round, and reactive to light.  Cardiovascular:     Rate and Rhythm: Normal rate and regular rhythm.  Pulmonary:     Effort: Pulmonary effort is normal.  Breath sounds: Normal breath sounds.  Abdominal:     Palpations: Abdomen is soft.   Musculoskeletal:     Cervical back: Normal range of motion and neck supple.     Right knee: Effusion, bony tenderness and crepitus present. Decreased range of motion. Tenderness present over the medial joint line and lateral joint line. Abnormal alignment.  Neurological:     Mental Status: She is alert and oriented to person, place, and time.  Psychiatric:        Behavior: Behavior normal.     Vital signs in last 24 hours:    Labs:   Estimated body mass index is 31.12 kg/m as calculated from the following:   Height as of 03/25/24: 5' 7 (1.702 m).   Weight as of 03/25/24: 90.1 kg.   Imaging Review Plain radiographs demonstrate severe degenerative joint disease of the right knee(s). The overall alignment ismild valgus. The bone quality appears to be excellent for age and reported activity level.      Assessment/Plan:  End stage arthritis, right knee   The patient history, physical examination, clinical judgment of the provider and imaging studies are consistent with end stage degenerative joint disease of the right knee(s) and total knee arthroplasty is deemed medically necessary. The treatment options including medical management, injection therapy arthroscopy and arthroplasty were discussed at length. The risks and benefits of total knee arthroplasty were presented and reviewed. The risks due to aseptic loosening, infection, stiffness, patella tracking problems, thromboembolic complications and other imponderables were discussed. The patient acknowledged the explanation, agreed to proceed with the plan and consent was signed. Patient is being admitted for inpatient treatment for surgery, pain control, PT, OT, prophylactic antibiotics, VTE prophylaxis, progressive ambulation and ADL's and discharge planning. The patient is planning to be discharged home with home health services

## 2024-04-05 ENCOUNTER — Encounter (HOSPITAL_COMMUNITY): Payer: Self-pay | Admitting: Orthopaedic Surgery

## 2024-04-05 ENCOUNTER — Ambulatory Visit (HOSPITAL_COMMUNITY)

## 2024-04-05 ENCOUNTER — Other Ambulatory Visit: Payer: Self-pay

## 2024-04-05 ENCOUNTER — Observation Stay (HOSPITAL_COMMUNITY)

## 2024-04-05 ENCOUNTER — Encounter (HOSPITAL_COMMUNITY): Admission: RE | Disposition: A | Payer: Self-pay | Source: Home / Self Care | Attending: Orthopaedic Surgery

## 2024-04-05 ENCOUNTER — Observation Stay (HOSPITAL_COMMUNITY)
Admission: RE | Admit: 2024-04-05 | Discharge: 2024-04-07 | Disposition: A | Discharge status: Home / Self Care | Attending: Orthopaedic Surgery | Admitting: Orthopaedic Surgery

## 2024-04-05 DIAGNOSIS — E119 Type 2 diabetes mellitus without complications: Secondary | ICD-10-CM | POA: Diagnosis not present

## 2024-04-05 DIAGNOSIS — Z7982 Long term (current) use of aspirin: Secondary | ICD-10-CM | POA: Insufficient documentation

## 2024-04-05 DIAGNOSIS — J45909 Unspecified asthma, uncomplicated: Secondary | ICD-10-CM | POA: Diagnosis not present

## 2024-04-05 DIAGNOSIS — I10 Essential (primary) hypertension: Secondary | ICD-10-CM | POA: Insufficient documentation

## 2024-04-05 DIAGNOSIS — Z91010 Allergy to peanuts: Secondary | ICD-10-CM | POA: Insufficient documentation

## 2024-04-05 DIAGNOSIS — Z01818 Encounter for other preprocedural examination: Secondary | ICD-10-CM

## 2024-04-05 DIAGNOSIS — M25561 Pain in right knee: Secondary | ICD-10-CM | POA: Diagnosis present

## 2024-04-05 DIAGNOSIS — G8918 Other acute postprocedural pain: Secondary | ICD-10-CM | POA: Diagnosis not present

## 2024-04-05 DIAGNOSIS — R609 Edema, unspecified: Secondary | ICD-10-CM | POA: Diagnosis not present

## 2024-04-05 DIAGNOSIS — Z471 Aftercare following joint replacement surgery: Secondary | ICD-10-CM | POA: Diagnosis not present

## 2024-04-05 DIAGNOSIS — M1711 Unilateral primary osteoarthritis, right knee: Secondary | ICD-10-CM | POA: Diagnosis not present

## 2024-04-05 DIAGNOSIS — Z96651 Presence of right artificial knee joint: Secondary | ICD-10-CM | POA: Diagnosis not present

## 2024-04-05 HISTORY — PX: KNEE SURGERY: SHX244

## 2024-04-05 HISTORY — PX: TOTAL KNEE ARTHROPLASTY: SHX125

## 2024-04-05 LAB — GLUCOSE, CAPILLARY
Glucose-Capillary: 129 mg/dL — ABNORMAL HIGH (ref 70–99)
Glucose-Capillary: 102 mg/dL — ABNORMAL HIGH (ref 70–99)
Glucose-Capillary: 127 mg/dL — ABNORMAL HIGH (ref 70–99)

## 2024-04-05 SURGERY — ARTHROPLASTY, KNEE, TOTAL
Anesthesia: General | Site: Knee | Laterality: Right

## 2024-04-05 MED ORDER — SORBITOL 70 % SOLN
30.0000 mL | Freq: Every day | Status: DC | PRN
  Administered 2024-04-06: 30 mL via ORAL
  Filled 2024-04-05 (×2): qty 30

## 2024-04-05 MED ORDER — ALBUTEROL SULFATE (2.5 MG/3ML) 0.083% IN NEBU: 2.5000 mg | INHALATION_SOLUTION | RESPIRATORY_TRACT | Status: DC | PRN

## 2024-04-05 MED ORDER — HYDROMORPHONE HCL 1 MG/ML IJ SOLN
INTRAMUSCULAR | Status: AC
  Filled 2024-04-05: qty 1

## 2024-04-05 MED ORDER — FENTANYL 12 MCG/HR TD PT72
1.0000 | MEDICATED_PATCH | TRANSDERMAL | Status: DC
  Filled 2024-04-05: qty 1

## 2024-04-05 MED ORDER — INSULIN ASPART 100 UNIT/ML IJ SOLN: 0.0000 [IU] | INTRAMUSCULAR | Status: DC | PRN

## 2024-04-05 MED ORDER — POVIDONE-IODINE 10 % EX SWAB
2.0000 | Freq: Once | CUTANEOUS | Status: DC
Start: 2024-04-05 — End: 2024-04-05

## 2024-04-05 MED ORDER — MUPIROCIN 2 % EX OINT
1.0000 | TOPICAL_OINTMENT | Freq: Once | CUTANEOUS | Status: AC
  Administered 2024-04-05: 1 via TOPICAL
  Filled 2024-04-05: qty 22

## 2024-04-05 MED ORDER — VALACYCLOVIR HCL 500 MG PO TABS
1000.0000 mg | ORAL_TABLET | Freq: Every day | ORAL | Status: DC
  Administered 2024-04-06 – 2024-04-07 (×2): 1000 mg via ORAL
  Filled 2024-04-05 (×2): qty 2

## 2024-04-05 MED ORDER — METOCLOPRAMIDE HCL 5 MG/ML IJ SOLN: 5.0000 mg | Freq: Three times a day (TID) | INTRAMUSCULAR | Status: DC | PRN

## 2024-04-05 MED ORDER — CEFAZOLIN SODIUM-DEXTROSE 2-4 GM/100ML-% IV SOLN
2.0000 g | INTRAVENOUS | Status: AC
  Administered 2024-04-05: 2 g via INTRAVENOUS
  Filled 2024-04-05: qty 100

## 2024-04-05 MED ORDER — ORAL CARE MOUTH RINSE: 15.0000 mL | Freq: Once | OROMUCOSAL | Status: AC

## 2024-04-05 MED ORDER — MIDAZOLAM HCL 2 MG/2ML IJ SOLN
INTRAMUSCULAR | Status: AC
  Administered 2024-04-05: 2 mg via INTRAVENOUS
  Filled 2024-04-05: qty 2

## 2024-04-05 MED ORDER — ONDANSETRON HCL 4 MG/2ML IJ SOLN
INTRAMUSCULAR | Status: DC | PRN
Start: 2024-04-05 — End: 2024-04-05
  Administered 2024-04-05: 4 mg via INTRAVENOUS

## 2024-04-05 MED ORDER — ESTRADIOL 0.5 MG PO TABS
1.0000 mg | ORAL_TABLET | Freq: Every day | ORAL | Status: DC
  Administered 2024-04-06 – 2024-04-07 (×2): 1 mg via ORAL
  Filled 2024-04-05 (×2): qty 2

## 2024-04-05 MED ORDER — OXYCODONE HCL 5 MG PO TABS: 5.0000 mg | ORAL_TABLET | Freq: Once | ORAL | Status: DC | PRN

## 2024-04-05 MED ORDER — HYDROMORPHONE HCL 1 MG/ML IJ SOLN
0.5000 mg | INTRAMUSCULAR | Status: DC | PRN
  Administered 2024-04-05 (×3): 0.5 mg via INTRAVENOUS

## 2024-04-05 MED ORDER — LAMOTRIGINE 100 MG PO TABS
200.0000 mg | ORAL_TABLET | Freq: Every day | ORAL | Status: DC
  Administered 2024-04-05 – 2024-04-06 (×2): 200 mg via ORAL
  Filled 2024-04-05 (×2): qty 2

## 2024-04-05 MED ORDER — BUPIVACAINE-MELOXICAM ER 200-6 MG/7ML IJ SOLN
INTRAMUSCULAR | Status: AC
Start: 2024-04-05 — End: 2024-04-05
  Filled 2024-04-05: qty 1

## 2024-04-05 MED ORDER — 0.9 % SODIUM CHLORIDE (POUR BTL) OPTIME
TOPICAL | Status: DC | PRN
  Administered 2024-04-05: 1000 mL

## 2024-04-05 MED ORDER — TRANEXAMIC ACID-NACL 1000-0.7 MG/100ML-% IV SOLN
1000.0000 mg | INTRAVENOUS | Status: AC
  Administered 2024-04-05: 1000 mg via INTRAVENOUS
  Filled 2024-04-05: qty 100

## 2024-04-05 MED ORDER — MUPIROCIN 2 % EX OINT: 1.0000 | TOPICAL_OINTMENT | Freq: Two times a day (BID) | CUTANEOUS | 0 refills | Status: AC

## 2024-04-05 MED ORDER — DEXAMETHASONE SODIUM PHOSPHATE 10 MG/ML IJ SOLN
INTRAMUSCULAR | Status: DC | PRN
  Administered 2024-04-05: 4 mg via INTRAVENOUS

## 2024-04-05 MED ORDER — ONDANSETRON HCL 4 MG/2ML IJ SOLN: 4.0000 mg | Freq: Four times a day (QID) | INTRAMUSCULAR | Status: DC | PRN

## 2024-04-05 MED ORDER — KETAMINE HCL 50 MG/5ML IJ SOSY
PREFILLED_SYRINGE | INTRAMUSCULAR | Status: DC | PRN
  Administered 2024-04-05 (×2): 25 mg via INTRAVENOUS

## 2024-04-05 MED ORDER — METHOCARBAMOL 500 MG PO TABS
ORAL_TABLET | ORAL | Status: AC
  Filled 2024-04-05: qty 2

## 2024-04-05 MED ORDER — OXYCODONE HCL 5 MG PO TABS
ORAL_TABLET | ORAL | Status: AC
  Filled 2024-04-05: qty 1

## 2024-04-05 MED ORDER — HYDROMORPHONE HCL 2 MG PO TABS
ORAL_TABLET | ORAL | Status: AC
  Filled 2024-04-05: qty 2

## 2024-04-05 MED ORDER — METOCLOPRAMIDE HCL 5 MG PO TABS: 5.0000 mg | ORAL_TABLET | Freq: Three times a day (TID) | ORAL | Status: DC | PRN

## 2024-04-05 MED ORDER — ROCURONIUM BROMIDE 10 MG/ML (PF) SYRINGE
PREFILLED_SYRINGE | INTRAVENOUS | Status: DC | PRN
  Administered 2024-04-05: 60 mg via INTRAVENOUS
  Administered 2024-04-05: 30 mg via INTRAVENOUS

## 2024-04-05 MED ORDER — FENTANYL 12 MCG/HR TD PT72
1.0000 | MEDICATED_PATCH | TRANSDERMAL | Status: DC
  Administered 2024-04-05: 1 via TRANSDERMAL
  Filled 2024-04-05: qty 1

## 2024-04-05 MED ORDER — ESCITALOPRAM OXALATE 10 MG PO TABS
20.0000 mg | ORAL_TABLET | Freq: Every day | ORAL | Status: DC
  Administered 2024-04-06 – 2024-04-07 (×2): 20 mg via ORAL
  Filled 2024-04-05 (×2): qty 2

## 2024-04-05 MED ORDER — HYDROMORPHONE HCL 1 MG/ML IJ SOLN
INTRAMUSCULAR | Status: AC
  Filled 2024-04-05: qty 0.5

## 2024-04-05 MED ORDER — PROPOFOL 10 MG/ML IV BOLUS
INTRAVENOUS | Status: DC | PRN
  Administered 2024-04-05: 150 mg via INTRAVENOUS

## 2024-04-05 MED ORDER — HYDROMORPHONE HCL 1 MG/ML IJ SOLN
INTRAMUSCULAR | Status: DC | PRN
  Administered 2024-04-05: 1 mg via INTRAVENOUS
  Administered 2024-04-05: .5 mg via INTRAVENOUS

## 2024-04-05 MED ORDER — METHOCARBAMOL 500 MG PO TABS
1000.0000 mg | ORAL_TABLET | Freq: Once | ORAL | Status: AC | PRN
  Administered 2024-04-05: 1000 mg via ORAL

## 2024-04-05 MED ORDER — BIOTIN 2500 MCG PO CAPS: 2.0000 | ORAL_CAPSULE | Freq: Every day | ORAL | Status: DC

## 2024-04-05 MED ORDER — DIPHENHYDRAMINE HCL 12.5 MG/5ML PO ELIX
12.5000 mg | ORAL_SOLUTION | ORAL | Status: DC | PRN
  Administered 2024-04-05: 25 mg via ORAL
  Filled 2024-04-05: qty 10

## 2024-04-05 MED ORDER — ALBUTEROL SULFATE HFA 108 (90 BASE) MCG/ACT IN AERS: 2.0000 | INHALATION_SPRAY | RESPIRATORY_TRACT | Status: DC | PRN

## 2024-04-05 MED ORDER — HYDROMORPHONE HCL 1 MG/ML IJ SOLN
2.0000 mg | INTRAMUSCULAR | Status: DC | PRN
  Administered 2024-04-05 – 2024-04-07 (×8): 2 mg via INTRAVENOUS
  Filled 2024-04-05 (×8): qty 2

## 2024-04-05 MED ORDER — ALPRAZOLAM 0.5 MG PO TABS
1.0000 mg | ORAL_TABLET | Freq: Two times a day (BID) | ORAL | Status: DC
Start: 2024-04-05 — End: 2024-04-05
  Filled 2024-04-05: qty 2

## 2024-04-05 MED ORDER — BUPIVACAINE-EPINEPHRINE (PF) 0.25% -1:200000 IJ SOLN
INTRAMUSCULAR | Status: DC | PRN
  Administered 2024-04-05: 30 mL via PERINEURAL

## 2024-04-05 MED ORDER — CHLORHEXIDINE GLUCONATE 0.12 % MT SOLN
15.0000 mL | Freq: Once | OROMUCOSAL | Status: AC
  Administered 2024-04-05: 15 mL via OROMUCOSAL
  Filled 2024-04-05: qty 15

## 2024-04-05 MED ORDER — PROPOFOL 10 MG/ML IV BOLUS
INTRAVENOUS | Status: AC
  Filled 2024-04-05: qty 20

## 2024-04-05 MED ORDER — ONDANSETRON HCL 4 MG/2ML IJ SOLN: 4.0000 mg | Freq: Once | INTRAMUSCULAR | Status: DC | PRN

## 2024-04-05 MED ORDER — DROPERIDOL 2.5 MG/ML IJ SOLN: 0.6250 mg | Freq: Once | INTRAMUSCULAR | Status: DC | PRN

## 2024-04-05 MED ORDER — ACETAMINOPHEN 325 MG PO TABS: 325.0000 mg | ORAL_TABLET | Freq: Four times a day (QID) | ORAL | Status: DC | PRN

## 2024-04-05 MED ORDER — FENTANYL CITRATE (PF) 100 MCG/2ML IJ SOLN
INTRAMUSCULAR | Status: AC
  Administered 2024-04-05: 100 ug via INTRAVENOUS
  Filled 2024-04-05: qty 2

## 2024-04-05 MED ORDER — ACETAMINOPHEN 10 MG/ML IV SOLN: 1000.0000 mg | Freq: Once | INTRAVENOUS | Status: DC | PRN

## 2024-04-05 MED ORDER — CEFAZOLIN SODIUM-DEXTROSE 2-4 GM/100ML-% IV SOLN
2.0000 g | Freq: Four times a day (QID) | INTRAVENOUS | Status: AC
  Administered 2024-04-05 (×2): 2 g via INTRAVENOUS
  Filled 2024-04-05 (×2): qty 100

## 2024-04-05 MED ORDER — ALPRAZOLAM ER 1 MG PO TB24: 1.0000 mg | ORAL_TABLET | Freq: Every day | ORAL | Status: DC

## 2024-04-05 MED ORDER — ALUM & MAG HYDROXIDE-SIMETH 200-200-20 MG/5ML PO SUSP: 30.0000 mL | ORAL | Status: DC | PRN

## 2024-04-05 MED ORDER — ASPIRIN 81 MG PO CHEW
81.0000 mg | CHEWABLE_TABLET | Freq: Two times a day (BID) | ORAL | Status: DC
  Administered 2024-04-05 – 2024-04-07 (×4): 81 mg via ORAL
  Filled 2024-04-05 (×4): qty 1

## 2024-04-05 MED ORDER — FENTANYL CITRATE (PF) 100 MCG/2ML IJ SOLN: 25.0000 ug | INTRAMUSCULAR | Status: DC | PRN

## 2024-04-05 MED ORDER — BUPIVACAINE-EPINEPHRINE (PF) 0.25% -1:200000 IJ SOLN
INTRAMUSCULAR | Status: AC
  Filled 2024-04-05: qty 30

## 2024-04-05 MED ORDER — SODIUM CHLORIDE 0.9 % IR SOLN
Status: DC | PRN
  Administered 2024-04-05: 1000 mL

## 2024-04-05 MED ORDER — TOPIRAMATE 25 MG PO TABS
50.0000 mg | ORAL_TABLET | Freq: Two times a day (BID) | ORAL | Status: DC
Start: 2024-04-05 — End: 2024-04-07
  Administered 2024-04-05 – 2024-04-07 (×4): 50 mg via ORAL
  Filled 2024-04-05 (×6): qty 2

## 2024-04-05 MED ORDER — LINACLOTIDE 145 MCG PO CAPS
290.0000 ug | ORAL_CAPSULE | Freq: Every day | ORAL | Status: DC
  Administered 2024-04-06 – 2024-04-07 (×2): 290 ug via ORAL
  Filled 2024-04-05 (×2): qty 2

## 2024-04-05 MED ORDER — INSULIN ASPART 100 UNIT/ML IJ SOLN: 0.0000 [IU] | Freq: Three times a day (TID) | INTRAMUSCULAR | Status: DC

## 2024-04-05 MED ORDER — LACTATED RINGERS IV SOLN
INTRAVENOUS | Status: DC
Start: 2024-04-05 — End: 2024-04-05

## 2024-04-05 MED ORDER — BUPROPION HCL ER (XL) 150 MG PO TB24
300.0000 mg | ORAL_TABLET | Freq: Every day | ORAL | Status: DC
  Administered 2024-04-06 – 2024-04-07 (×2): 300 mg via ORAL
  Filled 2024-04-05 (×2): qty 2

## 2024-04-05 MED ORDER — ALPRAZOLAM 0.5 MG PO TABS
1.0000 mg | ORAL_TABLET | Freq: Three times a day (TID) | ORAL | Status: DC
  Administered 2024-04-05 – 2024-04-07 (×7): 1 mg via ORAL
  Filled 2024-04-05 (×6): qty 2

## 2024-04-05 MED ORDER — LIDOCAINE 2% (20 MG/ML) 5 ML SYRINGE
INTRAMUSCULAR | Status: DC | PRN
  Administered 2024-04-05: 100 mg via INTRAVENOUS

## 2024-04-05 MED ORDER — DEXMEDETOMIDINE HCL IN NACL 80 MCG/20ML IV SOLN
INTRAVENOUS | Status: DC | PRN
  Administered 2024-04-05: 4 ug via INTRAVENOUS
  Administered 2024-04-05 (×2): 8 ug via INTRAVENOUS
  Administered 2024-04-05: 4 ug via INTRAVENOUS
  Administered 2024-04-05 (×2): 8 ug via INTRAVENOUS

## 2024-04-05 MED ORDER — TRAZODONE HCL 50 MG PO TABS
300.0000 mg | ORAL_TABLET | Freq: Every day | ORAL | Status: DC
  Administered 2024-04-05 – 2024-04-06 (×2): 300 mg via ORAL
  Filled 2024-04-05 (×2): qty 6

## 2024-04-05 MED ORDER — OXYCODONE HCL ER 10 MG PO T12A: 10.0000 mg | EXTENDED_RELEASE_TABLET | Freq: Two times a day (BID) | ORAL | Status: DC

## 2024-04-05 MED ORDER — POLYETHYLENE GLYCOL 3350 17 G PO PACK: 17.0000 g | PACK | Freq: Every day | ORAL | Status: DC | PRN

## 2024-04-05 MED ORDER — HYDROMORPHONE HCL 2 MG PO TABS
2.0000 mg | ORAL_TABLET | ORAL | Status: DC | PRN
  Administered 2024-04-05 – 2024-04-07 (×2): 3 mg via ORAL
  Administered 2024-04-07: 2 mg via ORAL
  Administered 2024-04-07: 3 mg via ORAL
  Filled 2024-04-05: qty 1
  Filled 2024-04-05 (×2): qty 2

## 2024-04-05 MED ORDER — PHENYLEPHRINE 80 MCG/ML (10ML) SYRINGE FOR IV PUSH (FOR BLOOD PRESSURE SUPPORT)
PREFILLED_SYRINGE | INTRAVENOUS | Status: DC | PRN
  Administered 2024-04-05: 80 ug via INTRAVENOUS
  Administered 2024-04-05 (×2): 160 ug via INTRAVENOUS

## 2024-04-05 MED ORDER — KETAMINE HCL 50 MG/5ML IJ SOSY
PREFILLED_SYRINGE | INTRAMUSCULAR | Status: AC
  Filled 2024-04-05: qty 5

## 2024-04-05 MED ORDER — MIDAZOLAM HCL 2 MG/2ML IJ SOLN: 2.0000 mg | Freq: Once | INTRAMUSCULAR | Status: AC

## 2024-04-05 MED ORDER — BUDESON-GLYCOPYRROL-FORMOTEROL 160-9-4.8 MCG/ACT IN AERO
2.0000 | INHALATION_SPRAY | Freq: Two times a day (BID) | RESPIRATORY_TRACT | Status: DC
  Administered 2024-04-06 – 2024-04-07 (×3): 2 via RESPIRATORY_TRACT
  Filled 2024-04-05: qty 5.9

## 2024-04-05 MED ORDER — SENNA 8.6 MG PO TABS: 2.0000 | ORAL_TABLET | Freq: Every evening | ORAL | Status: DC | PRN

## 2024-04-05 MED ORDER — PHENOL 1.4 % MT LIQD: 1.0000 | OROMUCOSAL | Status: DC | PRN

## 2024-04-05 MED ORDER — TIZANIDINE HCL 4 MG PO TABS
4.0000 mg | ORAL_TABLET | Freq: Four times a day (QID) | ORAL | Status: DC | PRN
  Administered 2024-04-06: 4 mg via ORAL
  Filled 2024-04-05: qty 1

## 2024-04-05 MED ORDER — BUPIVACAINE-MELOXICAM ER 200-6 MG/7ML IJ SOLN
INTRAMUSCULAR | Status: DC | PRN
  Administered 2024-04-05: 7 mL

## 2024-04-05 MED ORDER — METHYLPHENIDATE HCL ER (OSM) 27 MG PO TBCR
27.0000 mg | EXTENDED_RELEASE_TABLET | Freq: Every morning | ORAL | Status: DC
  Administered 2024-04-06: 27 mg via ORAL
  Filled 2024-04-05 (×2): qty 1

## 2024-04-05 MED ORDER — KETOROLAC TROMETHAMINE 15 MG/ML IJ SOLN
7.5000 mg | Freq: Four times a day (QID) | INTRAMUSCULAR | Status: AC
  Administered 2024-04-05 – 2024-04-06 (×4): 7.5 mg via INTRAVENOUS
  Filled 2024-04-05 (×4): qty 1

## 2024-04-05 MED ORDER — OXYCODONE HCL 5 MG/5ML PO SOLN: 5.0000 mg | Freq: Once | ORAL | Status: DC | PRN

## 2024-04-05 MED ORDER — DOCUSATE SODIUM 100 MG PO CAPS
100.0000 mg | ORAL_CAPSULE | Freq: Two times a day (BID) | ORAL | Status: DC
  Administered 2024-04-05 – 2024-04-07 (×5): 100 mg via ORAL
  Filled 2024-04-05 (×5): qty 1

## 2024-04-05 MED ORDER — ONDANSETRON HCL 4 MG PO TABS: 4.0000 mg | ORAL_TABLET | Freq: Four times a day (QID) | ORAL | Status: DC | PRN

## 2024-04-05 MED ORDER — MEDROXYPROGESTERONE ACETATE 2.5 MG PO TABS
2.5000 mg | ORAL_TABLET | Freq: Every day | ORAL | Status: DC
  Administered 2024-04-06 – 2024-04-07 (×2): 2.5 mg via ORAL
  Filled 2024-04-05 (×2): qty 1

## 2024-04-05 MED ORDER — HYDROMORPHONE HCL 2 MG PO TABS: 1.0000 mg | ORAL_TABLET | ORAL | Status: DC | PRN

## 2024-04-05 MED ORDER — MENTHOL 3 MG MT LOZG
1.0000 | LOZENGE | OROMUCOSAL | Status: DC | PRN
  Administered 2024-04-06 (×3): 3 mg via ORAL
  Filled 2024-04-05: qty 9

## 2024-04-05 MED ORDER — PANTOPRAZOLE SODIUM 40 MG PO TBEC
40.0000 mg | DELAYED_RELEASE_TABLET | Freq: Every day | ORAL | Status: DC
  Administered 2024-04-06 – 2024-04-07 (×2): 40 mg via ORAL
  Filled 2024-04-05 (×2): qty 1

## 2024-04-05 MED ORDER — CHLORHEXIDINE GLUCONATE 4 % EX SOLN: 1.0000 | CUTANEOUS | 1 refills | Status: AC

## 2024-04-05 MED ORDER — FENTANYL CITRATE (PF) 100 MCG/2ML IJ SOLN: 100.0000 ug | Freq: Once | INTRAMUSCULAR | Status: AC

## 2024-04-05 MED ORDER — SODIUM CHLORIDE 0.9 % IV SOLN: INTRAVENOUS | Status: AC

## 2024-04-05 MED ORDER — SUGAMMADEX SODIUM 200 MG/2ML IV SOLN
INTRAVENOUS | Status: DC | PRN
  Administered 2024-04-05: 200 mg via INTRAVENOUS

## 2024-04-05 SURGICAL SUPPLY — 59 items
BAG COUNTER SPONGE SURGICOUNT (BAG) ×1 IMPLANT
BANDAGE ESMARK 6X9 LF (GAUZE/BANDAGES/DRESSINGS) ×1 IMPLANT
BLADE SAG 18X100X1.27 (BLADE) ×1 IMPLANT
BNDG ELASTIC 6INX 5YD STR LF (GAUZE/BANDAGES/DRESSINGS) ×2 IMPLANT
BOWL SMART MIX CTS (DISPOSABLE) IMPLANT
CEMENT BONE R 1X40 (Cement) IMPLANT
COMPONENT FEM CMT PRSONA SZ9RT (Joint) IMPLANT
COMPONET TIB PS KNEE E 0D RT (Joint) IMPLANT
COOLER ICEMAN CLASSIC (MISCELLANEOUS) ×1 IMPLANT
COVER SURGICAL LIGHT HANDLE (MISCELLANEOUS) ×1 IMPLANT
CUFF TOURN SGL QUICK 42 (TOURNIQUET CUFF) IMPLANT
CUFF TRNQT CYL 34X4.125X (TOURNIQUET CUFF) ×1 IMPLANT
DRAPE EXTREMITY T 121X128X90 (DISPOSABLE) ×1 IMPLANT
DRAPE HALF SHEET 40X57 (DRAPES) ×1 IMPLANT
DRAPE U-SHAPE 47X51 STRL (DRAPES) ×1 IMPLANT
DRSG XEROFORM 1X8 (GAUZE/BANDAGES/DRESSINGS) IMPLANT
DURAPREP 26ML APPLICATOR (WOUND CARE) ×1 IMPLANT
ELECT CAUTERY BLADE 6.4 (BLADE) ×1 IMPLANT
ELECTRODE REM PT RTRN 9FT ADLT (ELECTROSURGICAL) ×1 IMPLANT
FACESHIELD WRAPAROUND OR TEAM (MASK) ×2 IMPLANT
GAUZE PAD ABD 7.5X8 STRL (GAUZE/BANDAGES/DRESSINGS) IMPLANT
GAUZE PAD ABD 8X10 STRL (GAUZE/BANDAGES/DRESSINGS) ×1 IMPLANT
GAUZE SPONGE 4X4 12PLY STRL (GAUZE/BANDAGES/DRESSINGS) ×1 IMPLANT
GAUZE XEROFORM 1X8 LF (GAUZE/BANDAGES/DRESSINGS) ×1 IMPLANT
GLOVE BIOGEL PI IND STRL 8 (GLOVE) ×2 IMPLANT
GLOVE ORTHO TXT STRL SZ7.5 (GLOVE) ×1 IMPLANT
GLOVE SURG ORTHO 8.0 STRL STRW (GLOVE) ×1 IMPLANT
GOWN STRL REUS W/ TWL LRG LVL3 (GOWN DISPOSABLE) IMPLANT
GOWN STRL REUS W/ TWL XL LVL3 (GOWN DISPOSABLE) ×2 IMPLANT
IMMOBILIZER KNEE 22 UNIV (SOFTGOODS) ×1 IMPLANT
IMMOBILIZER KNEE 24 THIGH 36 (SOFTGOODS) IMPLANT
INSERT TIB ARTISURF SZ8-11X12 (Insert) IMPLANT
IV 0.9% NACL 1000 ML (IV SOLUTION) ×1 IMPLANT
KIT BASIN OR (CUSTOM PROCEDURE TRAY) ×1 IMPLANT
KIT TURNOVER KIT B (KITS) ×1 IMPLANT
MANIFOLD NEPTUNE II (INSTRUMENTS) ×1 IMPLANT
NDL 18GX1X1/2 (RX/OR ONLY) (NEEDLE) IMPLANT
NEEDLE 18GX1X1/2 (RX/OR ONLY) (NEEDLE) IMPLANT
PACK TOTAL JOINT (CUSTOM PROCEDURE TRAY) ×1 IMPLANT
PAD ARMBOARD POSITIONER FOAM (MISCELLANEOUS) ×1 IMPLANT
PAD COLD SHLDR WRAP-ON (PAD) ×1 IMPLANT
PADDING CAST COTTON 6X4 STRL (CAST SUPPLIES) ×1 IMPLANT
PIN DRILL HDLS TROCAR 75 4PK (PIN) IMPLANT
SCREW FEMALE HEX FIX 25X2.5 (ORTHOPEDIC DISPOSABLE SUPPLIES) IMPLANT
SET HNDPC FAN SPRY TIP SCT (DISPOSABLE) ×1 IMPLANT
SET PAD KNEE POSITIONER (MISCELLANEOUS) ×1 IMPLANT
SOLN 0.9% NACL 1000 ML (IV SOLUTION) ×1 IMPLANT
SOLN 0.9% NACL POUR BTL 1000ML (IV SOLUTION) ×1 IMPLANT
STAPLER SKIN PROX 35W (STAPLE) ×1 IMPLANT
STEM POLY PAT PLY 32M KNEE (Knees) IMPLANT
SUCTION TUBE FRAZIER 10FR DISP (SUCTIONS) ×1 IMPLANT
SUT VIC AB 0 CT1 27XBRD ANBCTR (SUTURE) ×1 IMPLANT
SUT VIC AB 1 CT1 27XBRD ANBCTR (SUTURE) ×2 IMPLANT
SUT VIC AB 2-0 CT1 TAPERPNT 27 (SUTURE) ×2 IMPLANT
SYR 50ML LL SCALE MARK (SYRINGE) IMPLANT
SYR CONTROL 10ML LL (SYRINGE) ×1 IMPLANT
TOWEL GREEN STERILE (TOWEL DISPOSABLE) ×1 IMPLANT
TOWEL GREEN STERILE FF (TOWEL DISPOSABLE) ×1 IMPLANT
TRAY CATH INTERMITTENT SS 16FR (CATHETERS) IMPLANT

## 2024-04-05 NOTE — Discharge Instructions (Signed)

## 2024-04-05 NOTE — Anesthesia Procedure Notes (Signed)
 Procedure Name: Intubation Date/Time: 04/05/2024 9:03 AM  Performed by: Lockie Flesher, CRNAPre-anesthesia Checklist: Patient identified, Emergency Drugs available, Suction available and Patient being monitored Patient Re-evaluated:Patient Re-evaluated prior to induction Oxygen Delivery Method: Circle System Utilized Preoxygenation: Pre-oxygenation with 100% oxygen Induction Type: IV induction Ventilation: Mask ventilation without difficulty Laryngoscope Size: Mac and 3 Grade View: Grade I Tube type: Oral Tube size: 7.0 mm Number of attempts: 1 Airway Equipment and Method: Stylet and Oral airway Placement Confirmation: ETT inserted through vocal cords under direct vision, positive ETCO2 and breath sounds checked- equal and bilateral Secured at: 23 cm Tube secured with: Tape Dental Injury: Teeth and Oropharynx as per pre-operative assessment

## 2024-04-05 NOTE — Interval H&P Note (Signed)
 History and Physical Interval Note: The patient understands that she is here today for a right total knee replacement to treat her significant right knee pain and arthritis.  There has been no acute or interval change in her medical status.  The risks and benefits of surgery have been discussed in detail and informed consent has been obtained.  The right operative knee has been marked.  04/05/2024 7:06 AM  [name]  has presented today for surgery, with the diagnosis of osteoarthritis right knee.  The various methods of treatment have been discussed with the patient and family. After consideration of risks, benefits and other options for treatment, the patient has consented to  Procedure(s): ARTHROPLASTY, KNEE, TOTAL (Right) as a surgical intervention.  The patient's history has been reviewed, patient examined, no change in status, stable for surgery.  I have reviewed the patient's chart and labs.  Questions were answered to the patient's satisfaction.     [name]

## 2024-04-05 NOTE — Op Note (Signed)
 Operative Note  Date of operation: 04/05/2024 Preoperative diagnosis: Right knee primary osteoarthritis Postoperative diagnosis: Same  Procedure: Right cemented total knee arthroplasty  Implants: Biomet/Zimmer persona medial congruent cemented knee system Implant Name Type Inv. Item Serial No. Manufacturer Lot No. LRB No. Used Action  CEMENT BONE R 1X40 - [id number] Cement CEMENT BONE R 1X40  ZIMMER RECON(ORTH,TRAU,BIO,SG) [id number] Right 2 Implanted  INSERT TIB ARTISURF [id number] - [id number] Insert INSERT TIB ARTISURF [id number]  ZIMMER RECON(ORTH,TRAU,BIO,SG) [id number] Right 1 Implanted  STEM POLY PAT PLY 14M KNEE - [id number] Knees STEM POLY PAT PLY 14M KNEE  ZIMMER RECON(ORTH,TRAU,BIO,SG) [id number] Right 1 Implanted  COMPONENT FEM CMT PRSONA [id number] - [id number] Joint COMPONENT FEM CMT PRSONA [id number]  ZIMMER RECON(ORTH,TRAU,BIO,SG) [id number] Right 1 Implanted  COMPONET TIB PS KNEE E 0D RT - [id number] Joint COMPONET TIB PS KNEE E 0D RT  ZIMMER RECON(ORTH,TRAU,BIO,SG) [id number] Right 1 Implanted   Surgeon: Lonni GRADE. Vernetta, MD Assistant: Alisa Gaskins, PA-C  Anesthesia: #1 right lower extremity adductor canal block, #2 General, #3 local Tourniquet time: Under 1 hour EBL: Less than 50 cc Antibiotics: IV Ancef Complications: None  Indications: The patient is a 58 year old female with debilitating arthritis involving her right knee with valgus malalignment.  She has had surgery on this knee before as well as multiple injections.  At this point her right knee pain is daily and it is detrimentally affecting her mobility, quality life and actives daily living.  She has tried and failed conservative treatment for over a year now.  She is also on chronic narcotic medications and this certainly heightens her pain.  At this point we have recommended a total knee arthroplasty and she wished to proceed with this as well.  We did discuss in length and detail the risks of acute blood loss anemia, nerve  and vessel injury, fracture, infection, DVT, implant failure and wound healing issues.  She understands that our goals are hopefully decreased pain, improved mobility and improve quality of life.  Procedure description: After informed consent was obtained and the appropriate right knee was marked, anesthesia obtained a right lower extremity adductor canal block in the holding room.  The patient was then brought to the operating room and placed supine on the operating table where general anesthesia was obtained.  A nonsterile tractors placed around her upper right thigh and her right thigh, knee, leg, ankle and foot were prepped and draped with DuraPrep and sterile drapes including a sterile stockinette.  A timeout was called and she was identified as the correct patient the correct right knee.  An Esmarch was then used to wrap of the leg and the tourniquet was inflated to 300 mm of pressure.  With the knee extended a direct midline incision was made over the patella and carried proximally distally.  Dissection was carried down to the knee joint and a medial parapatellar arthrotomy today finding a moderate joint effusion.  With the knee in a flexed position we found osteophytes in all 3 compartments and significant cartilage wear.  Osteophytes were removed from all 3 compartments as well as remanence of the ACL and medial lateral meniscus.  We then used extramedullary based cutting guide for making her proximal tibia cut correcting for varus and valgus and a 7 degree slope.  We made this cut to take 2 mm off the low side and we did back it down to more millimeters.  We then used a intramedullary based cutting guide for distal femur cut setting at this for a  right knee at 5 degrees externally rotated and a 10 mm distal femoral cut.  We did backed this 1 down to more millimeters as well.  We brought the knee back down to full extension and achieve full extension with a 12 mm extension block.  We then impacted the  femur and put a femoral sizing guide based off of the epicondylar axis.  We then chose a size 9 femur.  We put a 4-in-1 cutting block for a size 9 femur and made the anterior and posterior cuts followed by the chamfer cuts.  We then backed the tibia and chose a size E right tibial tray for coverage over the tibial plateau so the rotation of the tibial tubercle and the femur.  We did a drill hole and keel punch off of this.  We then trialed our size E right tibia followed by our size 9 right CR standard femur.  We trialed a 12 mm right medial congruent polythene insert and we are pleased with range of motion and stability without insert.  We then made a patella cut and drilled 3 holes for a size 32 patella button.  We then removed all trial instrumentation from the knee and irrigated the knee with normal saline solution.  We then placed some Marcaine  with epinephrine  around the arthrotomy.  Next we mixed our cement with the knee in a flexed position cemented our Biomet/Zimmer persona tibial tray for right knee size E followed by cementing our size 9 right CR standard femur.  We placed our 12 Demeter thickness right medial congruent poly nature and cemented our size 32 patella button.  We then held the knee fully extended and compressed while the cemented hardened.  Once it hardened the tourniquet was let down and hemostasis was obtained with electrocautery.  We then placed some Zen relief in the joint capsule while closing the capsule with interrupted #1 Vicryl suture.  0 Vicryl is used as deep tissue and 2-0 Vicryl was used to cut subcutaneous tissue.  The skin was closed with staples.  Well-padded sterile dressing was applied.  The patient was awaken, extubated and taken recovery room.  Tory Gaskins, PA-C did assist during the entire case and beginning then his assistance was crucial and medically necessary for soft tissue management and retraction, helping guide implant placement and a layered closure of the  wound.

## 2024-04-05 NOTE — Anesthesia Postprocedure Evaluation (Signed)
 Anesthesia Post Note  Patient: [name]  Procedure(s) Performed: ARTHROPLASTY, KNEE, TOTAL (Right: Knee)     Patient location during evaluation: PACU Anesthesia Type: Regional and General Level of consciousness: awake Pain management: pain level controlled Vital Signs Assessment: post-procedure vital signs reviewed and stable Respiratory status: spontaneous breathing Cardiovascular status: blood pressure returned to baseline Postop Assessment: no apparent nausea or vomiting Anesthetic complications: no   No notable events documented.                Lauraine KATHEE [place]

## 2024-04-05 NOTE — Anesthesia Preprocedure Evaluation (Addendum)
 Anesthesia Evaluation  Patient identified by MRN, date of birth, ID band Patient awake    Reviewed: Allergy & Precautions, NPO status , Patient's Chart, lab work & pertinent test results  Airway Mallampati: II       Dental  (+) Teeth Intact, Dental Advisory Given   Pulmonary asthma , sleep apnea    breath sounds clear to auscultation       Cardiovascular hypertension,  Rhythm:Regular Rate:Normal     Neuro/Psych    GI/Hepatic ,GERD  ,,Hx of gastric sleeve   Endo/Other  diabetes, Type 2    Renal/GU      Musculoskeletal  (+) Arthritis ,  DDD of lumbar spine   Abdominal   Peds  Hematology  (+) Blood dyscrasia, anemia   Anesthesia Other Findings   Reproductive/Obstetrics                              Anesthesia Physical Anesthesia Plan  ASA: 2  Anesthesia Plan: General   Post-op Pain Management:    Induction: Intravenous  PONV Risk Score and Plan: 1 and Ondansetron  and Dexamethasone   Airway Management Planned: Oral ETT  Additional Equipment:   Intra-op Plan:   Post-operative Plan: Extubation in OR  Informed Consent:      Dental advisory given  Plan Discussed with: CRNA and Surgeon  Anesthesia Plan Comments: (History of chronic low back pain requiring significant opioid dependence. Hardware in lumbar spine - will plan for adductor canal analgesic block and GETA. )         Anesthesia Quick Evaluation

## 2024-04-05 NOTE — Progress Notes (Signed)
    Durable Medical Equipment  (From admission, onward)           Start     Ordered   04/05/24 1502  DME 3 n 1  Once       Comments: Bedside commode. Confine to one room   04/05/24 1502   04/05/24 1444  DME Walker rolling  Once       Question Answer Comment  Walker: With 5 Inch Wheels   Patient needs a walker to treat with the following condition Status post total right knee replacement      04/05/24 1443

## 2024-04-05 NOTE — Addendum Note (Signed)
 Addendum  created 04/05/24 1543 by Waddell Lauraine NOVAK, MD   Child order released for a procedure order, Clinical Note Signed, Intraprocedure Blocks edited, SmartForm saved

## 2024-04-05 NOTE — Progress Notes (Addendum)
-   S/P Right total knee arthroplasty, 9/30   04/05/24 1504  TOC Brief Assessment  Insurance and Status Reviewed  Patient has primary care physician Yes  Home environment has been reviewed From home with husband  Prior level of function: PTA independent with ADL's. Owns BSC.  Prior/Current Home Services No current home services  Social Drivers of Health Review SDOH reviewed no interventions necessary  Readmission risk has been reviewed No  Transition of care needs transition of care needs identified, TOC will continue to follow   Home health and DME orders noted. Pt home health services prearranged with [facility] by provider's office. Pt states only need RW. States has a BSC @ home. Pt without RX med concerns or transportation issues.  PT eval pending...  04/05/2024 @ 1529  Referral made with [facility] for RW . Equipment will be delivered to bedside prior to d/c.  IPCM following and will assist with needs.

## 2024-04-05 NOTE — Transfer of Care (Signed)
 Immediate Anesthesia Transfer of Care Note  Patient: [name]  Procedure(s) Performed: ARTHROPLASTY, KNEE, TOTAL (Right: Knee)  Patient Location: PACU  Anesthesia Type:General  Level of Consciousness: awake, alert , and oriented  Airway & Oxygen Therapy: Patient Spontanous Breathing  Post-op Assessment: Report given to RN and Post -op Vital signs reviewed and stable  Post vital signs: Reviewed and stable  Last Vitals:  Vitals Value Taken Time  BP 134/86 04/05/24 11:00  Temp 36.7 C 04/05/24 10:54  Pulse 82 04/05/24 11:02  Resp 14 04/05/24 11:02  SpO2 92 % 04/05/24 11:02  Vitals shown include unfiled device data.  Last Pain:  Vitals:   04/05/24 0849  TempSrc:   PainSc: 0-No pain         Complications: No notable events documented.

## 2024-04-05 NOTE — Anesthesia Procedure Notes (Addendum)
   Anesthesia Regional Block: Adductor canal block   Pre-Anesthetic Checklist: , timeout performed,  Correct Patient, Correct Site, Correct Laterality,  Correct Procedure, Correct Position, site marked,  Risks and benefits discussed,  Surgical consent,  Pre-op evaluation,  At surgeon's request and post-op pain management  Laterality: Lower and Right  Prep: chloraprep       Needles:  Injection technique: Single-shot  Needle Type: Echogenic Stimulator Needle     Needle Length: 9cm  Needle Gauge: 22     Additional Needles:   Procedures:,,,, ultrasound used (permanent image in chart),,     Nerve Stimulator or Paresthesia:  Response: quadraceps contraction  Additional Responses:   Narrative:  Start time: 04/05/2024 8:39 AM End time: 04/05/2024 8:40 AM Injection made incrementally with aspirations every 5 mL.  Performed by: Personally  Anesthesiologist: Waddell Lauraine NOVAK, MD

## 2024-04-05 NOTE — Evaluation (Signed)
 Physical Therapy Evaluation Patient Details Name: [name] MRN: [id number] DOB: September 14, 1965 Today's Date: 04/05/2024  History of Present Illness  58 y.o. female presents to [facility] hospital on 04/05/2024 for elective R TKA. PMH includes polycystic ovary disease, asthma, HLD, chronic migraine, GAD.  Clinical Impression  Pt presents to PT with deficits in functional mobility, gait, balance, strength, ROM. Pt is able to ambulate for household distances with support of the RW. PT provides education on the TKR exercise packet and encourages frequent mobilization with staff assistance. PT will follow up tomorrow for a progression of gait and to initiate stair training.        If plan is discharge home, recommend the following: A little help with walking and/or transfers;A little help with bathing/dressing/bathroom;Assistance with cooking/housework;Assist for transportation;Help with stairs or ramp for entrance   Can travel by private vehicle        Equipment Recommendations Rolling walker (2 wheels)  Recommendations for Other Services       Functional Status Assessment Patient has had a recent decline in their functional status and demonstrates the ability to make significant improvements in function in a reasonable and predictable amount of time.     Precautions / Restrictions Precautions Precautions: Fall;Knee Precaution Booklet Issued: Yes (comment) Recall of Precautions/Restrictions: Intact Required Braces or Orthoses: Knee Immobilizer - Right (worn pre and post mobility to better maintain knee extension at rest) Restrictions Weight Bearing Restrictions Per Provider Order: Yes RLE Weight Bearing Per Provider Order: Weight bearing as tolerated      Mobility  Bed Mobility Overal bed mobility: Needs Assistance Bed Mobility: Supine to Sit, Sit to Supine     Supine to sit: Supervision Sit to supine: Supervision        Transfers Overall transfer level: Needs  assistance Equipment used: Rolling walker (2 wheels) Transfers: Sit to/from Stand Sit to Stand: Contact guard assist                Ambulation/Gait Ambulation/Gait assistance: Contact guard assist Gait Distance (Feet): 120 Feet Assistive device: Rolling walker (2 wheels) Gait Pattern/deviations: Step-to pattern Gait velocity: reduced Gait velocity interpretation: <1.31 ft/sec, indicative of household ambulator   General Gait Details: slowed step-to gait, reduced stance time on RLE  Stairs            Wheelchair Mobility     Tilt Bed    Modified Rankin (Stroke Patients Only)       Balance Overall balance assessment: Needs assistance Sitting-balance support: No upper extremity supported, Feet supported Sitting balance-Leahy Scale: Good     Standing balance support: Single extremity supported, Reliant on assistive device for balance Standing balance-Leahy Scale: Poor                               Pertinent Vitals/Pain Pain Assessment Pain Assessment: 0-10 Pain Score: 10-Worst pain ever Pain Location: R knee Pain Descriptors / Indicators: Aching Pain Intervention(s): Premedicated before session    Home Living Family/patient expects to be discharged to:: Private residence Living Arrangements: Spouse/significant other Available Help at Discharge: Family;Available PRN/intermittently Type of Home: Mobile home Home Access: Stairs to enter Entrance Stairs-Rails: Can reach both Entrance Stairs-Number of Steps: 2   Home Layout: One level Home Equipment: Cane - single point;Other (comment);BSC/3in1 (loftstrand crutches)      Prior Function Prior Level of Function : Independent/Modified Independent             Mobility Comments:  ambulatory without DME       Extremity/Trunk Assessment   Upper Extremity Assessment Upper Extremity Assessment: Overall WFL for tasks assessed    Lower Extremity Assessment Lower Extremity Assessment: RLE  deficits/detail RLE Deficits / Details: post-op ROM and strength deficits as anticipated on POD 0    Cervical / Trunk Assessment Cervical / Trunk Assessment: Normal  Communication   Communication Communication: No apparent difficulties    Cognition Arousal: Alert Behavior During Therapy: WFL for tasks assessed/performed   PT - Cognitive impairments: No apparent impairments                         Following commands: Intact       Cueing Cueing Techniques: Verbal cues     General Comments General comments (skin integrity, edema, etc.): VSS on RA    Exercises Other Exercises Other Exercises: PT provides education on TKR exercise packet   Assessment/Plan    PT Assessment Patient needs continued PT services  PT Problem List Decreased strength;Decreased range of motion;Decreased activity tolerance;Decreased balance;Decreased mobility;Decreased knowledge of use of DME;Pain       PT Treatment Interventions DME instruction;Gait training;Stair training;Functional mobility training;Therapeutic activities;Therapeutic exercise;Balance training;Neuromuscular re-education;Patient/family education    PT Goals (Current goals can be found in the Care Plan section)  Acute Rehab PT Goals Patient Stated Goal: to return to independence PT Goal Formulation: With patient Time For Goal Achievement: 04/09/24 Potential to Achieve Goals: Good    Frequency 7X/week     Co-evaluation               AM-PAC PT 6 Clicks Mobility  Outcome Measure Help needed turning from your back to your side while in a flat bed without using bedrails?: A Little Help needed moving from lying on your back to sitting on the side of a flat bed without using bedrails?: A Little Help needed moving to and from a bed to a chair (including a wheelchair)?: A Little Help needed standing up from a chair using your arms (e.g., wheelchair or bedside chair)?: A Little Help needed to walk in hospital room?:  A Little Help needed climbing 3-5 steps with a railing? : A Lot 6 Click Score: 17    End of Session Equipment Utilized During Treatment: Gait belt Activity Tolerance: Patient tolerated treatment well Patient left: in bed;with call bell/phone within reach;with bed alarm set Nurse Communication: Mobility status PT Visit Diagnosis: Other abnormalities of gait and mobility (R26.89);Muscle weakness (generalized) (M62.81);Pain Pain - Right/Left: Right Pain - part of body: Knee    Time: 1630-1701 PT Time Calculation (min) (ACUTE ONLY): 31 min   Charges:   PT Evaluation $PT Eval Low Complexity: 1 Low   PT General Charges $$ ACUTE PT VISIT: 1 Visit         Bernardino JINNY Ruth, PT, DPT Acute Rehabilitation Office [phone]   Bernardino JINNY Ruth 04/05/2024, 5:10 PM

## 2024-04-06 ENCOUNTER — Encounter (HOSPITAL_COMMUNITY): Payer: Self-pay | Admitting: Orthopaedic Surgery

## 2024-04-06 DIAGNOSIS — M1711 Unilateral primary osteoarthritis, right knee: Secondary | ICD-10-CM | POA: Diagnosis not present

## 2024-04-06 LAB — CBC
HCT: 37.3 % (ref 36.0–46.0)
Hemoglobin: 12.2 g/dL (ref 12.0–15.0)
MCH: 30.7 pg (ref 26.0–34.0)
MCHC: 32.7 g/dL (ref 30.0–36.0)
MCV: 93.7 fL (ref 80.0–100.0)
Platelets: 205 K/uL (ref 150–400)
RBC: 3.98 MIL/uL (ref 3.87–5.11)
RDW: 13.2 % (ref 11.5–15.5)
WBC: 10.5 K/uL (ref 4.0–10.5)
nRBC: 0 % (ref 0.0–0.2)

## 2024-04-06 LAB — BASIC METABOLIC PANEL WITH GFR
Anion gap: 10 (ref 5–15)
BUN: 7 mg/dL (ref 6–20)
CO2: 28 mmol/L (ref 22–32)
Calcium: 8.4 mg/dL — ABNORMAL LOW (ref 8.9–10.3)
Chloride: 99 mmol/L (ref 98–111)
Creatinine, Ser: 0.89 mg/dL (ref 0.44–1.00)
GFR, Estimated: >60 mL/min (ref >60)
Glucose, Bld: 124 mg/dL — ABNORMAL HIGH (ref 70–99)
Potassium: 3.7 mmol/L (ref 3.5–5.1)
Sodium: 137 mmol/L (ref 135–145)

## 2024-04-06 NOTE — Care Management Obs Status (Signed)
 MEDICARE OBSERVATION STATUS NOTIFICATION   Patient Details  Name: [name] MRN: [id number] Date of Birth: [birth year]   Medicare Observation Status Notification Given:  Yes    Jon Cruel 04/06/2024, 12:04 PM

## 2024-04-06 NOTE — Progress Notes (Signed)
 Subjective: 1 Day Post-Op Procedure(s) (LRB): ARTHROPLASTY, KNEE, TOTAL (Right) Patient reports pain as quite significant, but on very strong narcotics chronically.    Objective: Vital signs in last 24 hours: Temp:  [98 F (36.7 C)-98.5 F (36.9 C)] 98.2 F (36.8 C) (10/01 0515) Pulse Rate:  [72-93] 88 (10/01 0515) Resp:  [10-21] 18 (10/01 0515) BP: (105-154)/(62-91) 115/62 (10/01 0515) SpO2:  [92 %-99 %] 94 % (10/01 0515)  Intake/Output from previous day: 09/30 0701 - 10/01 0700 In: 1100 [I.V.:900; IV Piggyback:200] Out: 50 [Blood:50] Intake/Output this shift: No intake/output data recorded.  Recent Labs    04/06/24 0523  HGB 12.2   Recent Labs    04/06/24 0523  WBC 10.5  RBC 3.98  HCT 37.3  PLT 205   Recent Labs    04/06/24 0523  NA 137  K 3.7  CL 99  CO2 28  BUN 7  CREATININE 0.89  GLUCOSE 124*  CALCIUM  8.4*   No results for input(s): LABPT, INR in the last 72 hours.  Sensation intact distally Intact pulses distally Dorsiflexion/Plantar flexion intact Incision: dressing C/D/I Compartment soft   Assessment/Plan: 1 Day Post-Op Procedure(s) (LRB): ARTHROPLASTY, KNEE, TOTAL (Right) Up with therapy      [name] Poli 04/06/2024, 7:41 AM

## 2024-04-06 NOTE — Progress Notes (Signed)
 PT Cancellation Note  Patient Details Name: [name] MRN: [id number] DOB: 12/03/1965   Cancelled Treatment:    Reason Eval/Treat Not Completed: Pain limiting ability to participate; Patient declined, no reason specified (Pt politely declined second PT session of the day reporting 11/10 R knee pain and not feeling up to mobilize. RN notified of pt's request for pain medicine. Ice applied to pt's R knee. Encouraged pt to complete R TKA HEP, sit up in recliner chair, and frequently mobilize with staff assistance.) Will follow-up for PT treatment as schedule permits.   Randall SAUNDERS, PT, DPT Acute Rehabilitation Services Office: [phone] Secure Chat Preferred  Delon CHRISTELLA Callander 04/06/2024, 4:36 PM

## 2024-04-06 NOTE — Progress Notes (Signed)
 Physical Therapy Treatment Patient Details Name: [name] MRN: [id number] DOB: [birth year] Today's Date: 04/06/2024   History of Present Illness 58 y.o. female presents to [facility] hospital on 04/05/2024 for elective R TKA. PMH includes polycystic ovary disease, asthma, HLD, chronic migraine, GAD.    PT Comments  Pt greeted supine in bed, pleasant and agreeable to PT session. She ambulated ~27ft using RW with CGA. Pt demonstrated a step-to antalgic gait pattern with heavy reliance on BUE support on RW. Pt completed RLE exercises supine in bed. She demonstrated limited active knee flexion ~15 deg. Pt was unable to perform a SLR; therefore, wore KI. Instructed pt to complete R TKA HEP 2 more times today and discussed how her husband could assist her with exercises. Will continue to follow acutely and advance appropriately.      If plan is discharge home, recommend the following: A little help with walking and/or transfers;A little help with bathing/dressing/bathroom;Assistance with cooking/housework;Assist for transportation;Help with stairs or ramp for entrance   Can travel by private vehicle        Equipment Recommendations  Rolling walker (2 wheels)    Recommendations for Other Services       Precautions / Restrictions Precautions Precautions: Knee;Fall Precaution Booklet Issued: Yes (comment) Recall of Precautions/Restrictions: Impaired Precaution/Restrictions Comments: Pt greeted with R knee resting in slight flexion, bed raised at knees and ice pack behind knee. Educated pt to maintain RLE in full extension without anything behind knee which could bend it. Required Braces or Orthoses: Knee Immobilizer - Right Knee Immobilizer - Right: Other (comment) (No order, but present in pt's room. She was unable to complete an active SLR.) Restrictions Weight Bearing Restrictions Per Provider Order: Yes RLE Weight Bearing Per Provider Order: Weight bearing as tolerated     Mobility  Bed  Mobility Overal bed mobility: Needs Assistance Bed Mobility: Supine to Sit, Sit to Supine     Supine to sit: Supervision Sit to supine: Supervision   General bed mobility comments: Pt sat up on L side of bed with increased time. She managed RLE by scooping it with LLE to be supported when moving.    Transfers Overall transfer level: Needs assistance Equipment used: Rolling walker (2 wheels) Transfers: Sit to/from Stand Sit to Stand: Contact guard assist           General transfer comment: Pt stood from lowest bed height. Cued proper hand placement using RW. Powered up with light assist. Good eccentric control. Pt heavily favoring LLE d/t pain.    Ambulation/Gait Ambulation/Gait assistance: Contact guard assist Gait Distance (Feet): 80 Feet Assistive device: Rolling walker (2 wheels) Gait Pattern/deviations: Step-to pattern, Decreased stance time - right, Decreased weight shift to right, Antalgic Gait velocity: decreased Gait velocity interpretation: <1.31 ft/sec, indicative of household ambulator   General Gait Details: Pt ambulated with short slow steps. She kept RLE infront of her, likely knee resting in slight flex (unable to confirm d/t KI). Heavy reliance on BUE support on RW to advance. Pt self-limiting WBing on RLE d/t pain. Postural shift to L-side. Pt with good proximity to RW. She took increased time to turn. Distance limited d/t fatigue.   Stairs             Wheelchair Mobility     Tilt Bed    Modified Rankin (Stroke Patients Only)       Balance Overall balance assessment: Needs assistance Sitting-balance support: Feet supported, Bilateral upper extremity supported Sitting balance-Leahy Scale: Fair Sitting balance -  Comments: Pt sat EOB with supervision   Standing balance support: Bilateral upper extremity supported, During functional activity, Reliant on assistive device for balance Standing balance-Leahy Scale: Poor Standing balance comment:  Pt dependent on RW                            Communication Communication Communication: No apparent difficulties  Cognition Arousal: Alert Behavior During Therapy: WFL for tasks assessed/performed   PT - Cognitive impairments: No apparent impairments                         Following commands: Intact      Cueing Cueing Techniques: Verbal cues, Visual cues  Exercises Total Joint Exercises Quad Sets: Supine, Both, AROM, 10 reps (with 5 second hold) Heel Slides: Supine, Left, AAROM, 10 reps Hip ABduction/ADduction: Supine, Left, AAROM, 10 reps Goniometric ROM: R Knee: AROM ~15deg.    General Comments General comments (skin integrity, edema, etc.): VSS on RA. Reviewed R TKA HEP. Encouraged pt to sit up in recliner chair for all meals.      Pertinent Vitals/Pain Pain Assessment Pain Assessment: Faces Faces Pain Scale: Hurts even more Pain Location: R knee Pain Descriptors / Indicators: Discomfort, Aching, Sore Pain Intervention(s): Monitored during session, Limited activity within patient's tolerance, Repositioned    Home Living                          Prior Function            PT Goals (current goals can now be found in the care plan section) Acute Rehab PT Goals Patient Stated Goal: Have less pain and move better Progress towards PT goals: Progressing toward goals    Frequency    7X/week      PT Plan      Co-evaluation              AM-PAC PT 6 Clicks Mobility   Outcome Measure  Help needed turning from your back to your side while in a flat bed without using bedrails?: A Little Help needed moving from lying on your back to sitting on the side of a flat bed without using bedrails?: A Little Help needed moving to and from a bed to a chair (including a wheelchair)?: A Little Help needed standing up from a chair using your arms (e.g., wheelchair or bedside chair)?: A Little Help needed to walk in hospital room?: A  Little Help needed climbing 3-5 steps with a railing? : A Lot 6 Click Score: 17    End of Session Equipment Utilized During Treatment: Gait belt;Right knee immobilizer Activity Tolerance: Patient tolerated treatment well;Patient limited by pain;Patient limited by fatigue Patient left: in bed;with call bell/phone within reach;with family/visitor present Nurse Communication: Mobility status PT Visit Diagnosis: Other abnormalities of gait and mobility (R26.89);Muscle weakness (generalized) (M62.81);Pain Pain - Right/Left: Right Pain - part of body: Knee     Time: 8965-8941 PT Time Calculation (min) (ACUTE ONLY): 24 min  Charges:    $Gait Training: 8-22 mins $Therapeutic Exercise: 8-22 mins PT General Charges $$ ACUTE PT VISIT: 1 Visit                     Randall SAUNDERS, PT, DPT Acute Rehabilitation Services Office: [phone] Secure Chat Preferred  [name] 04/06/2024, 12:58 PM

## 2024-04-06 NOTE — Plan of Care (Signed)

## 2024-04-07 DIAGNOSIS — M1711 Unilateral primary osteoarthritis, right knee: Secondary | ICD-10-CM | POA: Diagnosis not present

## 2024-04-07 DIAGNOSIS — Z96651 Presence of right artificial knee joint: Secondary | ICD-10-CM | POA: Diagnosis not present

## 2024-04-07 MED ORDER — FENTANYL 25 MCG/HR TD PT72: 1.0000 | MEDICATED_PATCH | TRANSDERMAL | 0 refills | Status: DC

## 2024-04-07 MED ORDER — CLEVIDIPINE BUTYRATE 0.5 MG/ML IV EMUL
INTRAVENOUS | Status: AC
Start: 2024-04-07 — End: 2024-04-07
  Filled 2024-04-07: qty 50

## 2024-04-07 MED ORDER — ASPIRIN 81 MG PO CHEW: 81.0000 mg | CHEWABLE_TABLET | Freq: Two times a day (BID) | ORAL | 0 refills | Status: DC

## 2024-04-07 MED ORDER — TIZANIDINE HCL 4 MG PO TABS
4.0000 mg | ORAL_TABLET | Freq: Four times a day (QID) | ORAL | 0 refills | Status: DC | PRN
Start: 2024-04-07 — End: 2024-04-19

## 2024-04-07 MED ORDER — HYDROMORPHONE HCL 4 MG PO TABS: 4.0000 mg | ORAL_TABLET | Freq: Four times a day (QID) | ORAL | 0 refills | Status: DC | PRN

## 2024-04-07 MED ORDER — CLEVIDIPINE BUTYRATE 0.5 MG/ML IV EMUL
INTRAVENOUS | Status: AC
  Filled 2024-04-07: qty 50

## 2024-04-07 NOTE — Progress Notes (Signed)
 Patient ID: [name], female   DOB: 09/19/1965, 58 y.o.   MRN: [id number] The patient is awake and alert.  She is making progress.  Her vital signs are stable.  Her right operative knee is stable.  She can be discharged to home today.

## 2024-04-07 NOTE — Progress Notes (Signed)
 Physical Therapy Treatment Patient Details Name: [name] MRN: [id number] DOB: 09-20-1965 Today's Date: 04/07/2024   History of Present Illness 58 y.o. female presents to [facility] hospital on 04/05/2024 for elective R TKA. PMH includes polycystic ovary disease, asthma, HLD, chronic migraine, GAD.    PT Comments  Pt resting in bed on arrival and agreeable to session. Session focused on stair training with pt able to ascend/descend x2 steps with bilateral rails to simulate home entrance with grossly CGA for safety and cues for LE sequencing. Pt continues to demonstrate bed mobility, transfers and gait with grossly CGA for safety with RW for support.  Continued education on precautions, appropriate activity progression, ICE, HEP and compliance and car transfer, with pt verbalizing understanding. Anticipate safe discharge, with assist level outlined below, once medically cleared, will continue to follow acutely.      If plan is discharge home, recommend the following: A little help with walking and/or transfers;A little help with bathing/dressing/bathroom;Assistance with cooking/housework;Assist for transportation;Help with stairs or ramp for entrance   Can travel by private vehicle        Equipment Recommendations  Rolling walker (2 wheels)    Recommendations for Other Services       Precautions / Restrictions Precautions Precautions: Knee;Fall Precaution Booklet Issued: Yes (comment) Recall of Precautions/Restrictions: Impaired Precaution/Restrictions Comments: Educated pt to maintain RLE in full extension without anything behind knee which could bend it. Required Braces or Orthoses: Knee Immobilizer - Right Knee Immobilizer - Right: Other (comment) (No order, but present in pt's room. She was unable to complete an active SLR.) Restrictions Weight Bearing Restrictions Per Provider Order: Yes RLE Weight Bearing Per Provider Order: Weight bearing as tolerated     Mobility  Bed  Mobility Overal bed mobility: Needs Assistance Bed Mobility: Supine to Sit     Supine to sit: Supervision     General bed mobility comments: Pt sat up on L side of bed with increased time. She managed RLE by scooping it with LLE to be supported when moving.    Transfers Overall transfer level: Needs assistance Equipment used: Rolling walker (2 wheels) Transfers: Sit to/from Stand Sit to Stand: Contact guard assist           General transfer comment: Pt standing from slightly elevated EOB to simu;ate bed height at home and recliner  with good recall for hand placement    Ambulation/Gait Ambulation/Gait assistance: Contact guard assist Gait Distance (Feet): 15 Feet (x2) Assistive device: Rolling walker (2 wheels) Gait Pattern/deviations: Step-to pattern, Decreased stance time - right, Decreased weight shift to right, Antalgic Gait velocity: decreased     General Gait Details: distance limited fo focus on stair training, cues for increased clearance on L   Stairs Stairs: Yes Stairs assistance: Contact guard assist Stair Management: Two rails, Step to pattern, Forwards Number of Stairs: 2 General stair comments: cues and demonstration provided initially with pt able to demo back with CGA for safety with light cues of descent for sequencing recall   Wheelchair Mobility     Tilt Bed    Modified Rankin (Stroke Patients Only)       Balance Overall balance assessment: Needs assistance Sitting-balance support: Feet supported, Bilateral upper extremity supported Sitting balance-Leahy Scale: Fair Sitting balance - Comments: Pt sat EOB with supervision   Standing balance support: Bilateral upper extremity supported, During functional activity, Reliant on assistive device for balance Standing balance-Leahy Scale: Poor Standing balance comment: Pt dependent on RW  Communication Communication Communication: No apparent  difficulties  Cognition Arousal: Alert Behavior During Therapy: WFL for tasks assessed/performed   PT - Cognitive impairments: No apparent impairments                         Following commands: Intact      Cueing Cueing Techniques: Verbal cues, Visual cues  Exercises Total Joint Exercises Quad Sets: Right, 10 reps (with 5 second hold) Heel Slides: AAROM, Right, 5 reps, Seated Hip ABduction/ADduction: AAROM, Right, 5 reps, Seated Knee Flexion: AROM, AAROM, Right, 10 reps, Seated Other Exercises Other Exercises: verbally reviwed HEP and compliance    General Comments General comments (skin integrity, edema, etc.): VSS on RA      Pertinent Vitals/Pain Pain Assessment Pain Assessment: Faces Pain Score: 10-Worst pain ever (11/10) Faces Pain Scale: Hurts little more Pain Location: R knee Pain Descriptors / Indicators: Discomfort, Aching, Sore Pain Intervention(s): Monitored during session, Limited activity within patient's tolerance, Repositioned, Ice applied    Home Living                          Prior Function            PT Goals (current goals can now be found in the care plan section) Acute Rehab PT Goals Patient Stated Goal: Have less pain and move better PT Goal Formulation: With patient Time For Goal Achievement: 04/09/24 Progress towards PT goals: Progressing toward goals    Frequency    7X/week      PT Plan      Co-evaluation              AM-PAC PT 6 Clicks Mobility   Outcome Measure  Help needed turning from your back to your side while in a flat bed without using bedrails?: A Little Help needed moving from lying on your back to sitting on the side of a flat bed without using bedrails?: A Little Help needed moving to and from a bed to a chair (including a wheelchair)?: A Little Help needed standing up from a chair using your arms (e.g., wheelchair or bedside chair)?: A Little Help needed to walk in hospital room?: A  Little Help needed climbing 3-5 steps with a railing? : A Little 6 Click Score: 18    End of Session Equipment Utilized During Treatment: Right knee immobilizer Activity Tolerance: Patient tolerated treatment well Patient left: with call bell/phone within reach;with family/visitor present;in chair;with nursing/sitter in room Nurse Communication: Mobility status PT Visit Diagnosis: Other abnormalities of gait and mobility (R26.89);Muscle weakness (generalized) (M62.81);Pain Pain - Right/Left: Right Pain - part of body: Knee     Time: 8655-8640 PT Time Calculation (min) (ACUTE ONLY): 15 min  Charges:    $Gait Training: 8-22 mins PT General Charges $$ ACUTE PT VISIT: 1 Visit                     Meighan Treto R. PTA Acute Rehabilitation Services Office: [phone]   Therisa CHRISTELLA Boor 04/07/2024, 3:40 PM

## 2024-04-07 NOTE — Progress Notes (Signed)
 [name] to be D/C'd Home per MD order.  Discussed with the patient and all questions fully answered.  VSS, Skin clean, dry and intact without evidence of skin break down, no evidence of skin tears noted. IV catheter discontinued intact. Site without signs and symptoms of complications. Dressing and pressure applied.  An After Visit Summary was printed and given to the patient. Patient prescriptions sent to patient pharmacy.  D/c education completed with patient/family including follow up instructions, medication list, d/c activities limitations if indicated, with other d/c instructions as indicated by MD - patient able to verbalize understanding, all questions fully answered.   Patient instructed to return to ED, call 911, or call MD for any changes in condition.   Patient escorted via WC, and D/C home via private auto.  [name] 04/07/2024 2:02 PM

## 2024-04-07 NOTE — Discharge Summary (Signed)
 Patient ID: [name] MRN: [id number] DOB/AGE: [birth year] 58 y.o.  Admit date: 04/05/2024 Discharge date: 04/07/2024  Admission Diagnoses:  Principal Problem:   Unilateral primary osteoarthritis, right knee Active Problems:   Status post total right knee replacement   Discharge Diagnoses:  Same  Past Medical History:  Diagnosis Date   Allergy    Anxiety    Arthritis    Asthma    History of Asthma   Bipolar disorder (HCC)    Chest pain, atypical 11/30/2012   Clotting disorder    Depression    Diabetes mellitus without complication (HCC)    type 2, pre-diabetic before she lost weight   GERD (gastroesophageal reflux disease)    H/O degenerative disc disease    L4-L5, L5-S1   Headache    Heart murmur    Hyperglycemia    Postoperative hyperglycemia   Hypertension    hx of   Neuromuscular disorder (HCC)    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 female who was admitted 04/05/2024 for operative treatment ofUnilateral primary osteoarthritis, right knee. Patient has severe unremitting pain that affects sleep, daily activities, and work/hobbies. After pre-op clearance the patient was taken to the operating room on 04/05/2024 and underwent  Procedure(s): ARTHROPLASTY, KNEE, TOTAL.    Patient was given perioperative antibiotics:  Anti-infectives (From admission, onward)    Start     Dose/Rate Route Frequency Ordered Stop   04/06/24 1000  valACYclovir  (VALTREX ) tablet 1,000 mg        1,000 mg  Oral Daily 04/05/24 1443     04/05/24 1530  ceFAZolin (ANCEF) IVPB 2g/100 mL premix        2 g 200 mL/hr over 30 Minutes Intravenous Every 6 hours 04/05/24 1443 04/05/24 2113   04/05/24 0730  ceFAZolin (ANCEF) IVPB 2g/100 mL premix        2 g 200 mL/hr over 30 Minutes Intravenous On call to O.R. 04/05/24 9281 04/05/24 0908        Patient was given sequential compression devices, early ambulation, and chemoprophylaxis to prevent DVT.  Inpatient Morphine  Milligram Equivalents Per Day 9/30 - 10/2   Values displayed are in units of MME/Day    Order Start / End Date 9/30 Yesterday Today    oxyCODONE  (Oxy IR/ROXICODONE ) immediate release tablet 5 mg 9/30 - 9/30 0 of Unknown -- --    oxyCODONE  (ROXICODONE ) 5 MG/5ML solution 5 mg 9/30 - 9/30 0 of Unknown -- --      Group total: 0 of Unknown      fentaNYL  (SUBLIMAZE ) injection 25-50 mcg 9/30 - 9/30 0 of 45-90 -- --    fentaNYL  (SUBLIMAZE ) 100 MCG/2ML injection 9/30 - 9/30 0 of Unknown -- --    fentaNYL  (SUBLIMAZE ) injection 100 mcg 9/30 - 9/30 30 of 30 -- --    HYDROmorphone  (DILAUDID ) injection 0.5 mg 9/30 - 9/30 30 of 40 -- --  HYDROmorphone  (DILAUDID ) injection 9/30 - 9/30 *30 of 30 -- --    fentaNYL  (DURAGESIC ) 12 MCG/HR 1 patch 9/30 - 9/30 0 of 8.4 0 of 28.8 0 of 28.8    Daily Totals  * 90 of Unknown (at least 153.4-198.4) 0 of 28.8 0 of 28.8  *One-Step medication  Calculation Errors     Order Type Date Details   oxyCODONE  (Oxy IR/ROXICODONE ) immediate release tablet 5 mg Ordered Dose -- Insufficient frequency information   oxyCODONE  (ROXICODONE ) 5 MG/5ML solution 5 mg Ordered Dose -- Insufficient frequency information   fentaNYL  (SUBLIMAZE ) 100 MCG/2ML injection Ordered Dose -- Frequency type could not be determined            Patient benefited maximally from hospital stay and there were no complications.    Recent vital signs: Patient Vitals for the past 24 hrs:  BP Temp Temp src Pulse Resp SpO2  04/07/24 0808 (!) 155/123  98 F (36.7 C) -- 91 17 94 %  04/07/24 0758 -- -- -- -- -- 98 %  04/07/24 0433 111/64 -- -- -- -- --  04/07/24 0429 103/71 98.8 F (37.1 C) Oral (!) 106 16 98 %  04/07/24 0100 -- 98.9 F (37.2 C) Oral -- -- --  04/06/24 2006 (!) 141/73 100 F (37.8 C) Oral (!) 104 17 96 %     Recent laboratory studies:  Recent Labs    04/06/24 0523  WBC 10.5  HGB 12.2  HCT 37.3  PLT 205  NA 137  K 3.7  CL 99  CO2 28  BUN 7  CREATININE 0.89  GLUCOSE 124*  CALCIUM  8.4*     Discharge Medications:   Allergies as of 04/07/2024       Reactions   Carbamazepine Swelling   Rash and tongue swelling   Duloxetine Other (See Comments), Itching   Confusion/dizziness Unknown reaction    Unknown reaction Unknown reaction    Confusion/dizziness   Erythromycin Swelling, Nausea And Vomiting, Other (See Comments)   Erythromycin Base Swelling   Gabapentin Nausea And Vomiting, Anaphylaxis   Pineapple Shortness Of Breath, Swelling   Throat swells   Pregabalin Swelling   Shellfish-derived Products Anaphylaxis   Sulfa  Antibiotics Hives, Shortness Of Breath, Itching, Anxiety, Palpitations   Bee Pollen Other (See Comments)   Stuffy  Nose   Pollen Extract Other (See Comments)   Stuffy  Nose   Peanut (diagnostic) Rash        Medication List     STOP taking these medications    fentaNYL  12 MCG/HR Commonly known as: DURAGESIC  Replaced by: fentaNYL  25 MCG/HR       TAKE these medications    Ajovy  225 MG/1.5ML Soaj Generic drug: Fremanezumab -vfrm Inject 225 mg into the skin every 30 (thirty) days.   albuterol  108 (90 Base) MCG/ACT inhaler Commonly known as: VENTOLIN  HFA Inhale 2 puffs into the lungs every 4 (four) hours as needed for wheezing or shortness of breath.   ALPRAZolam 1 MG 24 hr tablet Commonly known as: XANAX XR Take 1 mg by mouth at bedtime.   ALPRAZolam 1 MG tablet Commonly known as: XANAX Take 1 mg by mouth 2 (two) times daily.   aspirin 81 MG chewable  tablet Chew 1 tablet (81 mg total) by mouth 2 (two) times daily.   Biotin 2500 MCG Caps Take 2 each by mouth daily in the afternoon.   buPROPion 150 MG 24 hr tablet Commonly known as: WELLBUTRIN XL Take 300 mg by mouth daily.  chlorhexidine  4 % external liquid Commonly known as: HIBICLENS  Apply 15 mLs (1 Application total) topically as directed for 30 doses. Use as directed daily for 5 days every other week for 6 weeks.   DSS 100 MG Caps Take 100 mg by mouth daily as needed (CONSTIPATION).   EPINEPHrine  0.3 mg/0.3 mL Soaj injection Commonly known as: EPI-PEN Inject 0.3 mg into the muscle as needed for anaphylaxis.   escitalopram  20 MG tablet Commonly known as: LEXAPRO  Take 1 tablet (20 mg total) by mouth daily with breakfast.   estradiol  1 MG tablet Commonly known as: ESTRACE  Take 1 mg by mouth daily.   fentaNYL  25 MCG/HR Commonly known as: DURAGESIC  Place 1 patch onto the skin every 3 (three) days. Replaces: fentaNYL  12 MCG/HR   FIBER PO Take 2 each by mouth daily.   fluticasone  50 MCG/ACT nasal spray Commonly known as: FLONASE  Place 2 sprays into both nostrils daily. What changed:  when to take this reasons to take this   Humira (2 Pen) 40 MG/0.4ML pen Generic drug: adalimumab Inject 40 mg into the skin every 14 (fourteen) days.   HYDROmorphone  4 MG tablet Commonly known as: Dilaudid  Take 1 tablet (4 mg total) by mouth every 6 (six) hours as needed for severe pain (pain score 7-10). What changed:  medication strength how much to take when to take this reasons to take this   lamoTRIgine 200 MG tablet Commonly known as: LAMICTAL Take 200 mg by mouth at bedtime.   linaclotide  290 MCG Caps capsule Commonly known as: LINZESS  Take 290 mcg by mouth daily before breakfast.   medroxyPROGESTERone  2.5 MG tablet Commonly known as: PROVERA  Take 1 tablet (2.5 mg total) by mouth daily.   Melatonin 5 MG Chew Chew 20 mg by mouth at bedtime.   methylphenidate  27 MG CR tablet Commonly known as: CONCERTA Take 27 mg by mouth every morning.   MULTIVITAMIN GUMMIES WOMENS PO Take 2 each by mouth daily in the afternoon.   mupirocin ointment 2 % Commonly known as: BACTROBAN Place 1 Application into the nose 2 (two) times daily for 60 doses. Use as directed 2 times daily for 5 days every other week for 6 weeks.   naloxone 4 MG/0.1ML Liqd nasal spray kit Commonly known as: NARCAN Place 1 spray into the nose once.   Nurtec 75 MG Tbdp Generic drug: Rimegepant Sulfate Take 1 tablet at the onset of a migraine.  Only 1 tablet in 24 hours   pantoprazole  40 MG tablet Commonly known as: PROTONIX  Take 1 tablet (40 mg total) by mouth daily. Office visit for further refills   senna 8.6 MG tablet Commonly known as: SENOKOT Take 2 tablets by mouth at bedtime as needed (CONSTIPATION).   temazepam 30 MG capsule Commonly known as: RESTORIL Take 30 mg by mouth at bedtime.   tiZANidine 4 MG tablet Commonly known as: Zanaflex Take 1 tablet (4 mg total) by mouth every 6 (six) hours as needed for muscle spasms. What changed:  medication strength how much to take when to take this   topiramate 50 MG tablet Commonly known as: TOPAMAX Take 50 mg by mouth 2 (two) times daily.   traZODone  100 MG tablet Commonly known as: DESYREL  Take 300 mg by mouth at bedtime.   Trelegy Ellipta 200-62.5-25 MCG/ACT Aepb Generic drug: Fluticasone -Umeclidin-Vilant Inhale 2 puffs into the lungs daily.   valACYclovir  1000 MG tablet Commonly known as: VALTREX  Take 1 tablet (1,000 mg total) by mouth daily.  Durable Medical Equipment  (From admission, onward)           Start     Ordered   04/05/24 1502  DME 3 n 1  Once       Comments: Bedside commode. Confine to one room   04/05/24 1502   04/05/24 1444  DME Walker rolling  Once       Question Answer Comment  Walker: With 5 Inch Wheels   Patient needs a walker to treat with the following  condition Status post total right knee replacement      04/05/24 1443            Diagnostic Studies: DG Knee Right Port Result Date: 04/05/2024 CLINICAL DATA:  Status post right knee replacement. EXAM: DG KNEE 1-2V PORT*R* COMPARISON:  None Available. FINDINGS: Right knee arthroplasty in expected alignment. No periprosthetic lucency or fracture. There has been patellar resurfacing. Recent postsurgical change includes air and edema in the soft tissues and joint space. Anterior skin staples in place. IMPRESSION: Right knee arthroplasty without immediate postoperative complication. Electronically Signed   By: Andrea Gasman M.D.   On: 04/05/2024 14:58   DG UGI W SINGLE CM (SOL OR THIN BA) Result Date: 03/24/2024 CLINICAL DATA:  Patient with a history of sleeve gastrectomy, GERD, hiatal hernia. Patient with complaints of dysphagia reflux. EXAM: DG UGI W SINGLE CM TECHNIQUE: Scout radiograph was obtained. Single contrast examination was performed using thin liquid barium. This exam was performed by Warren Dais, NP, and was supervised and interpreted by Dr. DOROTHA Seip. FLUOROSCOPY: Radiation Exposure Index (as provided by the fluoroscopic device): 74.8 mGy Kerma COMPARISON:  DG upper GI 09/19/2016 FINDINGS: Scout Radiograph: Normal bowel gas pattern Esophagus:  Normal appearance. Esophageal motility:  Mild dysmotility Gastroesophageal reflux:  None visualized. Ingested 13mm barium tablet:  Passed normally Stomach: Expected post-surgical changes Gastric emptying: Normal. Duodenum:  Normal appearance. Other:  Small hiatal hernia. IMPRESSION: Mild dysmotility and small hiatal hernia. Electronically Signed   By: Lynwood Seip Raddle M.D.   On: 03/24/2024 11:33    Disposition: Discharge disposition: 01-Home or Self Care          Follow-up Information     Nicholaus Credit, PA-C Follow up.   Specialty: Physician Assistant Contact information: [address] [place] [address] [phone]          [facility] Follow up.   Specialty: Home Health Services Why: home health services will be provided by [facility]. Contact information: [address] [place] [address] [phone]         Vernetta Lonni GRADE, MD Follow up in 2 week(s).   Specialty: Orthopedic Surgery Contact information: [address] [place]  [place] [address] [phone]                  Signed: Lonni GRADE Vernetta 04/07/2024, 3:44 PM

## 2024-04-07 NOTE — Progress Notes (Signed)
 Physical Therapy Treatment Patient Details Name: [name] MRN: [id number] DOB: [birth year] Today's Date: 04/07/2024   History of Present Illness 58 y.o. female presents to [facility] hospital on 04/05/2024 for elective R TKA. PMH includes polycystic ovary disease, asthma, HLD, chronic migraine, GAD.    PT Comments  Pt resting in bed on arrival, agreeable to session with steady progress towards acute goals. Pt continues to limited by RLE pain, limiting weight bearing and standing tolerance. Pt demonstrating bed mobility, transfers and gait with RW for support with grossly CGA for safety. Pt performing seated LE therex for increased ROM and strength. Pt was educated on continued walker use to maximize functional independence, safety, and decrease risk for falls as well as ice, HEP and compliance, safe car entry/exit and importance of continued mobility. Plan to progress stair training in PM session. Pt continues to benefit from skilled PT services to progress toward functional mobility goals.     If plan is discharge home, recommend the following: A little help with walking and/or transfers;A little help with bathing/dressing/bathroom;Assistance with cooking/housework;Assist for transportation;Help with stairs or ramp for entrance   Can travel by private vehicle        Equipment Recommendations  Rolling walker (2 wheels)    Recommendations for Other Services       Precautions / Restrictions Precautions Precautions: Knee;Fall Precaution Booklet Issued: Yes (comment) Recall of Precautions/Restrictions: Impaired Precaution/Restrictions Comments: Educated pt to maintain RLE in full extension without anything behind knee which could bend it. Required Braces or Orthoses: Knee Immobilizer - Right Knee Immobilizer - Right: Other (comment) (No order, but present in pt's room. She was unable to complete an active SLR.) Restrictions Weight Bearing Restrictions Per Provider Order: Yes RLE Weight  Bearing Per Provider Order: Weight bearing as tolerated     Mobility  Bed Mobility Overal bed mobility: Needs Assistance Bed Mobility: Supine to Sit     Supine to sit: Supervision     General bed mobility comments: Pt sat up on L side of bed with increased time. She managed RLE by scooping it with LLE to be supported when moving.    Transfers Overall transfer level: Needs assistance Equipment used: Rolling walker (2 wheels) Transfers: Sit to/from Stand Sit to Stand: Contact guard assist           General transfer comment: Pt standing from slightly elevated EOB to simu;ate bed height at home and low commode with cues for hand placement and rail use    Ambulation/Gait Ambulation/Gait assistance: Contact guard assist Gait Distance (Feet): 90 Feet Assistive device: Rolling walker (2 wheels) Gait Pattern/deviations: Step-to pattern, Decreased stance time - right, Decreased weight shift to right, Antalgic Gait velocity: decreased     General Gait Details: cues for LE sequcning and increased step length on L, pt self limiting weight bearing on R due to pain, ntoed R knee flexed in stance, cues for increased clearance on L during swing phase as pt sliding LLE along floor   Stairs             Wheelchair Mobility     Tilt Bed    Modified Rankin (Stroke Patients Only)       Balance Overall balance assessment: Needs assistance Sitting-balance support: Feet supported, Bilateral upper extremity supported Sitting balance-Leahy Scale: Fair Sitting balance - Comments: Pt sat EOB with supervision   Standing balance support: Bilateral upper extremity supported, During functional activity, Reliant on assistive device for balance Standing balance-Leahy Scale: Poor Standing balance  comment: Pt dependent on RW                            Communication Communication Communication: No apparent difficulties  Cognition Arousal: Alert Behavior During Therapy: WFL  for tasks assessed/performed   PT - Cognitive impairments: No apparent impairments                         Following commands: Intact      Cueing Cueing Techniques: Verbal cues, Visual cues  Exercises Total Joint Exercises Quad Sets: Right, 10 reps (with 5 second hold) Heel Slides: AAROM, Right, 5 reps, Seated Hip ABduction/ADduction: AAROM, Right, 5 reps, Seated Knee Flexion: AROM, AAROM, Right, 10 reps, Seated    General Comments General comments (skin integrity, edema, etc.): VSS on RA, pt spouse present and supportive      Pertinent Vitals/Pain Pain Assessment Pain Assessment: 0-10 Pain Score: 10-Worst pain ever (11/10) Pain Location: R knee Pain Descriptors / Indicators: Discomfort, Aching, Sore Pain Intervention(s): Monitored during session, Limited activity within patient's tolerance, Repositioned, Ice applied    Home Living                          Prior Function            PT Goals (current goals can now be found in the care plan section) Acute Rehab PT Goals Patient Stated Goal: Have less pain and move better PT Goal Formulation: With patient Time For Goal Achievement: 04/09/24 Progress towards PT goals: Progressing toward goals    Frequency    7X/week      PT Plan      Co-evaluation              AM-PAC PT 6 Clicks Mobility   Outcome Measure  Help needed turning from your back to your side while in a flat bed without using bedrails?: A Little Help needed moving from lying on your back to sitting on the side of a flat bed without using bedrails?: A Little Help needed moving to and from a bed to a chair (including a wheelchair)?: A Little Help needed standing up from a chair using your arms (e.g., wheelchair or bedside chair)?: A Little Help needed to walk in hospital room?: A Little Help needed climbing 3-5 steps with a railing? : A Lot 6 Click Score: 17    End of Session Equipment Utilized During Treatment: Right  knee immobilizer Activity Tolerance: Patient tolerated treatment well;Patient limited by pain;Patient limited by fatigue Patient left: with call bell/phone within reach;with family/visitor present;in chair Nurse Communication: Mobility status PT Visit Diagnosis: Other abnormalities of gait and mobility (R26.89);Muscle weakness (generalized) (M62.81);Pain Pain - Right/Left: Right Pain - part of body: Knee     Time: 9090-9058 PT Time Calculation (min) (ACUTE ONLY): 32 min  Charges:    $Gait Training: 8-22 mins $Therapeutic Exercise: 8-22 mins PT General Charges $$ ACUTE PT VISIT: 1 Visit                     Epsie Walthall R. PTA Acute Rehabilitation Services Office: [phone]   Therisa CHRISTELLA Boor 04/07/2024, 10:17 AM

## 2024-04-08 DIAGNOSIS — I10 Essential (primary) hypertension: Secondary | ICD-10-CM | POA: Diagnosis not present

## 2024-04-08 DIAGNOSIS — Z471 Aftercare following joint replacement surgery: Secondary | ICD-10-CM | POA: Diagnosis not present

## 2024-04-08 DIAGNOSIS — K219 Gastro-esophageal reflux disease without esophagitis: Secondary | ICD-10-CM | POA: Diagnosis not present

## 2024-04-08 DIAGNOSIS — G43909 Migraine, unspecified, not intractable, without status migrainosus: Secondary | ICD-10-CM | POA: Diagnosis not present

## 2024-04-08 DIAGNOSIS — Z9089 Acquired absence of other organs: Secondary | ICD-10-CM | POA: Diagnosis not present

## 2024-04-08 DIAGNOSIS — F5101 Primary insomnia: Secondary | ICD-10-CM | POA: Diagnosis not present

## 2024-04-08 DIAGNOSIS — I4711 Inappropriate sinus tachycardia, so stated: Secondary | ICD-10-CM | POA: Diagnosis not present

## 2024-04-08 DIAGNOSIS — J309 Allergic rhinitis, unspecified: Secondary | ICD-10-CM | POA: Diagnosis not present

## 2024-04-08 DIAGNOSIS — G4733 Obstructive sleep apnea (adult) (pediatric): Secondary | ICD-10-CM | POA: Diagnosis not present

## 2024-04-08 DIAGNOSIS — Z7951 Long term (current) use of inhaled steroids: Secondary | ICD-10-CM | POA: Diagnosis not present

## 2024-04-08 DIAGNOSIS — Z96651 Presence of right artificial knee joint: Secondary | ICD-10-CM | POA: Diagnosis not present

## 2024-04-08 DIAGNOSIS — Z96652 Presence of left artificial knee joint: Secondary | ICD-10-CM | POA: Diagnosis not present

## 2024-04-08 DIAGNOSIS — E119 Type 2 diabetes mellitus without complications: Secondary | ICD-10-CM | POA: Diagnosis not present

## 2024-04-08 DIAGNOSIS — E669 Obesity, unspecified: Secondary | ICD-10-CM | POA: Diagnosis not present

## 2024-04-08 DIAGNOSIS — G5603 Carpal tunnel syndrome, bilateral upper limbs: Secondary | ICD-10-CM | POA: Diagnosis not present

## 2024-04-08 DIAGNOSIS — Z7982 Long term (current) use of aspirin: Secondary | ICD-10-CM | POA: Diagnosis not present

## 2024-04-08 DIAGNOSIS — F319 Bipolar disorder, unspecified: Secondary | ICD-10-CM | POA: Diagnosis not present

## 2024-04-08 DIAGNOSIS — Z8701 Personal history of pneumonia (recurrent): Secondary | ICD-10-CM | POA: Diagnosis not present

## 2024-04-08 DIAGNOSIS — K5900 Constipation, unspecified: Secondary | ICD-10-CM | POA: Diagnosis not present

## 2024-04-08 DIAGNOSIS — Z556 Problems related to health literacy: Secondary | ICD-10-CM | POA: Diagnosis not present

## 2024-04-08 DIAGNOSIS — Z9181 History of falling: Secondary | ICD-10-CM | POA: Diagnosis not present

## 2024-04-11 DIAGNOSIS — E669 Obesity, unspecified: Secondary | ICD-10-CM | POA: Diagnosis not present

## 2024-04-11 DIAGNOSIS — K5900 Constipation, unspecified: Secondary | ICD-10-CM | POA: Diagnosis not present

## 2024-04-11 DIAGNOSIS — F319 Bipolar disorder, unspecified: Secondary | ICD-10-CM | POA: Diagnosis not present

## 2024-04-11 DIAGNOSIS — Z8701 Personal history of pneumonia (recurrent): Secondary | ICD-10-CM | POA: Diagnosis not present

## 2024-04-11 DIAGNOSIS — Z471 Aftercare following joint replacement surgery: Secondary | ICD-10-CM | POA: Diagnosis not present

## 2024-04-11 DIAGNOSIS — Z96651 Presence of right artificial knee joint: Secondary | ICD-10-CM | POA: Diagnosis not present

## 2024-04-11 DIAGNOSIS — G4733 Obstructive sleep apnea (adult) (pediatric): Secondary | ICD-10-CM | POA: Diagnosis not present

## 2024-04-11 DIAGNOSIS — F5101 Primary insomnia: Secondary | ICD-10-CM | POA: Diagnosis not present

## 2024-04-11 DIAGNOSIS — G5603 Carpal tunnel syndrome, bilateral upper limbs: Secondary | ICD-10-CM | POA: Diagnosis not present

## 2024-04-11 DIAGNOSIS — I10 Essential (primary) hypertension: Secondary | ICD-10-CM | POA: Diagnosis not present

## 2024-04-11 DIAGNOSIS — J309 Allergic rhinitis, unspecified: Secondary | ICD-10-CM | POA: Diagnosis not present

## 2024-04-11 DIAGNOSIS — G43909 Migraine, unspecified, not intractable, without status migrainosus: Secondary | ICD-10-CM | POA: Diagnosis not present

## 2024-04-11 DIAGNOSIS — K219 Gastro-esophageal reflux disease without esophagitis: Secondary | ICD-10-CM | POA: Diagnosis not present

## 2024-04-11 DIAGNOSIS — Z556 Problems related to health literacy: Secondary | ICD-10-CM | POA: Diagnosis not present

## 2024-04-11 DIAGNOSIS — Z9089 Acquired absence of other organs: Secondary | ICD-10-CM | POA: Diagnosis not present

## 2024-04-11 DIAGNOSIS — Z7982 Long term (current) use of aspirin: Secondary | ICD-10-CM | POA: Diagnosis not present

## 2024-04-11 DIAGNOSIS — Z9181 History of falling: Secondary | ICD-10-CM | POA: Diagnosis not present

## 2024-04-11 DIAGNOSIS — Z7951 Long term (current) use of inhaled steroids: Secondary | ICD-10-CM | POA: Diagnosis not present

## 2024-04-11 DIAGNOSIS — Z96652 Presence of left artificial knee joint: Secondary | ICD-10-CM | POA: Diagnosis not present

## 2024-04-11 DIAGNOSIS — E119 Type 2 diabetes mellitus without complications: Secondary | ICD-10-CM | POA: Diagnosis not present

## 2024-04-11 DIAGNOSIS — I4711 Inappropriate sinus tachycardia, so stated: Secondary | ICD-10-CM | POA: Diagnosis not present

## 2024-04-13 DIAGNOSIS — Z8701 Personal history of pneumonia (recurrent): Secondary | ICD-10-CM | POA: Diagnosis not present

## 2024-04-13 DIAGNOSIS — K219 Gastro-esophageal reflux disease without esophagitis: Secondary | ICD-10-CM | POA: Diagnosis not present

## 2024-04-13 DIAGNOSIS — Z96652 Presence of left artificial knee joint: Secondary | ICD-10-CM | POA: Diagnosis not present

## 2024-04-13 DIAGNOSIS — E669 Obesity, unspecified: Secondary | ICD-10-CM | POA: Diagnosis not present

## 2024-04-13 DIAGNOSIS — F5101 Primary insomnia: Secondary | ICD-10-CM | POA: Diagnosis not present

## 2024-04-13 DIAGNOSIS — Z556 Problems related to health literacy: Secondary | ICD-10-CM | POA: Diagnosis not present

## 2024-04-13 DIAGNOSIS — Z96651 Presence of right artificial knee joint: Secondary | ICD-10-CM | POA: Diagnosis not present

## 2024-04-13 DIAGNOSIS — G4733 Obstructive sleep apnea (adult) (pediatric): Secondary | ICD-10-CM | POA: Diagnosis not present

## 2024-04-13 DIAGNOSIS — Z7951 Long term (current) use of inhaled steroids: Secondary | ICD-10-CM | POA: Diagnosis not present

## 2024-04-13 DIAGNOSIS — F319 Bipolar disorder, unspecified: Secondary | ICD-10-CM | POA: Diagnosis not present

## 2024-04-13 DIAGNOSIS — E119 Type 2 diabetes mellitus without complications: Secondary | ICD-10-CM | POA: Diagnosis not present

## 2024-04-13 DIAGNOSIS — G43909 Migraine, unspecified, not intractable, without status migrainosus: Secondary | ICD-10-CM | POA: Diagnosis not present

## 2024-04-13 DIAGNOSIS — Z9181 History of falling: Secondary | ICD-10-CM | POA: Diagnosis not present

## 2024-04-13 DIAGNOSIS — J309 Allergic rhinitis, unspecified: Secondary | ICD-10-CM | POA: Diagnosis not present

## 2024-04-13 DIAGNOSIS — G5603 Carpal tunnel syndrome, bilateral upper limbs: Secondary | ICD-10-CM | POA: Diagnosis not present

## 2024-04-13 DIAGNOSIS — K5900 Constipation, unspecified: Secondary | ICD-10-CM | POA: Diagnosis not present

## 2024-04-13 DIAGNOSIS — Z7982 Long term (current) use of aspirin: Secondary | ICD-10-CM | POA: Diagnosis not present

## 2024-04-13 DIAGNOSIS — Z471 Aftercare following joint replacement surgery: Secondary | ICD-10-CM | POA: Diagnosis not present

## 2024-04-13 DIAGNOSIS — I10 Essential (primary) hypertension: Secondary | ICD-10-CM | POA: Diagnosis not present

## 2024-04-13 DIAGNOSIS — I4711 Inappropriate sinus tachycardia, so stated: Secondary | ICD-10-CM | POA: Diagnosis not present

## 2024-04-13 DIAGNOSIS — Z9089 Acquired absence of other organs: Secondary | ICD-10-CM | POA: Diagnosis not present

## 2024-04-14 ENCOUNTER — Telehealth: Payer: Self-pay

## 2024-04-14 DIAGNOSIS — F3175 Bipolar disorder, in partial remission, most recent episode depressed: Secondary | ICD-10-CM | POA: Diagnosis not present

## 2024-04-14 NOTE — Telephone Encounter (Signed)
 Patient aware and says she will reach out to her pain mgmt

## 2024-04-14 NOTE — Telephone Encounter (Signed)
 Patient called triage. He had TKA with Dr.Blackson on 04/05/2024. She said that Dr.Blackman was aware she would probably have difficulty controlling her pain after surgery. She states that she has been taking 4mg  of Dilaudid  and using a Fentanyl  patch. The pain is unbearable. Please call patient. #[phone]

## 2024-04-15 DIAGNOSIS — K5900 Constipation, unspecified: Secondary | ICD-10-CM | POA: Diagnosis not present

## 2024-04-15 DIAGNOSIS — I4711 Inappropriate sinus tachycardia, so stated: Secondary | ICD-10-CM | POA: Diagnosis not present

## 2024-04-15 DIAGNOSIS — J309 Allergic rhinitis, unspecified: Secondary | ICD-10-CM | POA: Diagnosis not present

## 2024-04-15 DIAGNOSIS — Z556 Problems related to health literacy: Secondary | ICD-10-CM | POA: Diagnosis not present

## 2024-04-15 DIAGNOSIS — Z471 Aftercare following joint replacement surgery: Secondary | ICD-10-CM | POA: Diagnosis not present

## 2024-04-15 DIAGNOSIS — Z9181 History of falling: Secondary | ICD-10-CM | POA: Diagnosis not present

## 2024-04-15 DIAGNOSIS — G4733 Obstructive sleep apnea (adult) (pediatric): Secondary | ICD-10-CM | POA: Diagnosis not present

## 2024-04-15 DIAGNOSIS — Z96652 Presence of left artificial knee joint: Secondary | ICD-10-CM | POA: Diagnosis not present

## 2024-04-15 DIAGNOSIS — K219 Gastro-esophageal reflux disease without esophagitis: Secondary | ICD-10-CM | POA: Diagnosis not present

## 2024-04-15 DIAGNOSIS — G43909 Migraine, unspecified, not intractable, without status migrainosus: Secondary | ICD-10-CM | POA: Diagnosis not present

## 2024-04-15 DIAGNOSIS — Z7951 Long term (current) use of inhaled steroids: Secondary | ICD-10-CM | POA: Diagnosis not present

## 2024-04-15 DIAGNOSIS — E119 Type 2 diabetes mellitus without complications: Secondary | ICD-10-CM | POA: Diagnosis not present

## 2024-04-15 DIAGNOSIS — I10 Essential (primary) hypertension: Secondary | ICD-10-CM | POA: Diagnosis not present

## 2024-04-15 DIAGNOSIS — G5603 Carpal tunnel syndrome, bilateral upper limbs: Secondary | ICD-10-CM | POA: Diagnosis not present

## 2024-04-15 DIAGNOSIS — Z7982 Long term (current) use of aspirin: Secondary | ICD-10-CM | POA: Diagnosis not present

## 2024-04-15 DIAGNOSIS — F5101 Primary insomnia: Secondary | ICD-10-CM | POA: Diagnosis not present

## 2024-04-15 DIAGNOSIS — Z8701 Personal history of pneumonia (recurrent): Secondary | ICD-10-CM | POA: Diagnosis not present

## 2024-04-15 DIAGNOSIS — E669 Obesity, unspecified: Secondary | ICD-10-CM | POA: Diagnosis not present

## 2024-04-15 DIAGNOSIS — Z9089 Acquired absence of other organs: Secondary | ICD-10-CM | POA: Diagnosis not present

## 2024-04-15 DIAGNOSIS — F319 Bipolar disorder, unspecified: Secondary | ICD-10-CM | POA: Diagnosis not present

## 2024-04-15 DIAGNOSIS — Z96651 Presence of right artificial knee joint: Secondary | ICD-10-CM | POA: Diagnosis not present

## 2024-04-18 DIAGNOSIS — G4733 Obstructive sleep apnea (adult) (pediatric): Secondary | ICD-10-CM | POA: Diagnosis not present

## 2024-04-18 DIAGNOSIS — Z96652 Presence of left artificial knee joint: Secondary | ICD-10-CM | POA: Diagnosis not present

## 2024-04-18 DIAGNOSIS — E119 Type 2 diabetes mellitus without complications: Secondary | ICD-10-CM | POA: Diagnosis not present

## 2024-04-18 DIAGNOSIS — Z96651 Presence of right artificial knee joint: Secondary | ICD-10-CM | POA: Diagnosis not present

## 2024-04-18 DIAGNOSIS — K5900 Constipation, unspecified: Secondary | ICD-10-CM | POA: Diagnosis not present

## 2024-04-18 DIAGNOSIS — Z471 Aftercare following joint replacement surgery: Secondary | ICD-10-CM | POA: Diagnosis not present

## 2024-04-18 DIAGNOSIS — F5101 Primary insomnia: Secondary | ICD-10-CM | POA: Diagnosis not present

## 2024-04-18 DIAGNOSIS — F319 Bipolar disorder, unspecified: Secondary | ICD-10-CM | POA: Diagnosis not present

## 2024-04-18 DIAGNOSIS — I4711 Inappropriate sinus tachycardia, so stated: Secondary | ICD-10-CM | POA: Diagnosis not present

## 2024-04-18 DIAGNOSIS — K219 Gastro-esophageal reflux disease without esophagitis: Secondary | ICD-10-CM | POA: Diagnosis not present

## 2024-04-18 DIAGNOSIS — G5603 Carpal tunnel syndrome, bilateral upper limbs: Secondary | ICD-10-CM | POA: Diagnosis not present

## 2024-04-18 DIAGNOSIS — I10 Essential (primary) hypertension: Secondary | ICD-10-CM | POA: Diagnosis not present

## 2024-04-18 DIAGNOSIS — E669 Obesity, unspecified: Secondary | ICD-10-CM | POA: Diagnosis not present

## 2024-04-18 DIAGNOSIS — Z8701 Personal history of pneumonia (recurrent): Secondary | ICD-10-CM | POA: Diagnosis not present

## 2024-04-18 DIAGNOSIS — Z7982 Long term (current) use of aspirin: Secondary | ICD-10-CM | POA: Diagnosis not present

## 2024-04-18 DIAGNOSIS — J309 Allergic rhinitis, unspecified: Secondary | ICD-10-CM | POA: Diagnosis not present

## 2024-04-18 DIAGNOSIS — Z9089 Acquired absence of other organs: Secondary | ICD-10-CM | POA: Diagnosis not present

## 2024-04-18 DIAGNOSIS — G43909 Migraine, unspecified, not intractable, without status migrainosus: Secondary | ICD-10-CM | POA: Diagnosis not present

## 2024-04-18 DIAGNOSIS — Z9181 History of falling: Secondary | ICD-10-CM | POA: Diagnosis not present

## 2024-04-18 DIAGNOSIS — Z7951 Long term (current) use of inhaled steroids: Secondary | ICD-10-CM | POA: Diagnosis not present

## 2024-04-18 DIAGNOSIS — Z556 Problems related to health literacy: Secondary | ICD-10-CM | POA: Diagnosis not present

## 2024-04-19 ENCOUNTER — Encounter: Payer: Self-pay | Admitting: Physician Assistant

## 2024-04-19 ENCOUNTER — Ambulatory Visit (INDEPENDENT_AMBULATORY_CARE_PROVIDER_SITE_OTHER): Admitting: Physician Assistant

## 2024-04-19 DIAGNOSIS — Z96651 Presence of right artificial knee joint: Secondary | ICD-10-CM

## 2024-04-19 MED ORDER — HYDROMORPHONE HCL 4 MG PO TABS: 4.0000 mg | ORAL_TABLET | Freq: Four times a day (QID) | ORAL | 0 refills | Status: AC | PRN

## 2024-04-19 MED ORDER — TIZANIDINE HCL 4 MG PO TABS: 4.0000 mg | ORAL_TABLET | Freq: Four times a day (QID) | ORAL | 0 refills | Status: DC | PRN

## 2024-04-19 NOTE — Progress Notes (Signed)
 HPI: [name] returns today status post right total knee arthroplasty 04/05/2024.  She is walking with a walker.  States her pain was severe in the right knee.  She been taking Dilaudid  for pain but this ran out.  She is doing home therapy.  States she is unable to get to outpatient therapy and is not requesting continued therapy at home.  She does ask about CPM.  She continues to take Zanaflex for muscle spasm and finds this beneficial.  She is taking aspirin twice daily for DVT prophylaxis.  No aspirin prior to surgery.  Review of systems: See HPI otherwise negative  Physical exam: General Well-developed well-nourished pleasant female in no acute distress uses a walker to ambulate. Right knee surgical incisions well-approximated staples no signs of dehiscence or infection.  Staples were removed.  Steri-Strips applied full extension flexion to 70 degrees right knee.  Calf supple nontender.  Dorsiflexion plantarflexion right ankle intact.  Impression: Status post right total knee arthroplasty 04/05/2024  Plan: She will continue home health PT for range of motion and strengthening of the knee.  Discussed CPM do not feel it be beneficial at this point in time.  Will refill her Dilaudid  today.  She will continue aspirin 81 mg once daily for another week and then discontinue as she is on no aspirin prior to surgery.  Will see her back in 4 weeks see how she is doing overall.  Questions were encouraged and answered at length.  Scar tissue mobilization encouraged.

## 2024-04-20 ENCOUNTER — Telehealth: Payer: Self-pay | Admitting: Physician Assistant

## 2024-04-20 NOTE — Telephone Encounter (Signed)
 Patient  called and said now the pharmacy said that they needed an override for her medication. CB#[phone]

## 2024-04-20 NOTE — Telephone Encounter (Signed)
 Called patient. Rx was authorized through [employer].   PA case ID# [id number]   Rx # [id number]

## 2024-04-20 NOTE — Telephone Encounter (Signed)
 Patient called and said the pharmacy needs authorization for hydromorphone . CB#[phone]

## 2024-04-21 DIAGNOSIS — Z556 Problems related to health literacy: Secondary | ICD-10-CM | POA: Diagnosis not present

## 2024-04-21 DIAGNOSIS — E669 Obesity, unspecified: Secondary | ICD-10-CM | POA: Diagnosis not present

## 2024-04-21 DIAGNOSIS — Z96651 Presence of right artificial knee joint: Secondary | ICD-10-CM | POA: Diagnosis not present

## 2024-04-21 DIAGNOSIS — K219 Gastro-esophageal reflux disease without esophagitis: Secondary | ICD-10-CM | POA: Diagnosis not present

## 2024-04-21 DIAGNOSIS — Z7951 Long term (current) use of inhaled steroids: Secondary | ICD-10-CM | POA: Diagnosis not present

## 2024-04-21 DIAGNOSIS — E119 Type 2 diabetes mellitus without complications: Secondary | ICD-10-CM | POA: Diagnosis not present

## 2024-04-21 DIAGNOSIS — Z7982 Long term (current) use of aspirin: Secondary | ICD-10-CM | POA: Diagnosis not present

## 2024-04-21 DIAGNOSIS — G5603 Carpal tunnel syndrome, bilateral upper limbs: Secondary | ICD-10-CM | POA: Diagnosis not present

## 2024-04-21 DIAGNOSIS — Z8701 Personal history of pneumonia (recurrent): Secondary | ICD-10-CM | POA: Diagnosis not present

## 2024-04-21 DIAGNOSIS — Z96652 Presence of left artificial knee joint: Secondary | ICD-10-CM | POA: Diagnosis not present

## 2024-04-21 DIAGNOSIS — F5101 Primary insomnia: Secondary | ICD-10-CM | POA: Diagnosis not present

## 2024-04-21 DIAGNOSIS — Z9181 History of falling: Secondary | ICD-10-CM | POA: Diagnosis not present

## 2024-04-21 DIAGNOSIS — G4733 Obstructive sleep apnea (adult) (pediatric): Secondary | ICD-10-CM | POA: Diagnosis not present

## 2024-04-21 DIAGNOSIS — Z9089 Acquired absence of other organs: Secondary | ICD-10-CM | POA: Diagnosis not present

## 2024-04-21 DIAGNOSIS — J309 Allergic rhinitis, unspecified: Secondary | ICD-10-CM | POA: Diagnosis not present

## 2024-04-21 DIAGNOSIS — G43909 Migraine, unspecified, not intractable, without status migrainosus: Secondary | ICD-10-CM | POA: Diagnosis not present

## 2024-04-21 DIAGNOSIS — Z471 Aftercare following joint replacement surgery: Secondary | ICD-10-CM | POA: Diagnosis not present

## 2024-04-21 DIAGNOSIS — I4711 Inappropriate sinus tachycardia, so stated: Secondary | ICD-10-CM | POA: Diagnosis not present

## 2024-04-21 DIAGNOSIS — F319 Bipolar disorder, unspecified: Secondary | ICD-10-CM | POA: Diagnosis not present

## 2024-04-21 DIAGNOSIS — I10 Essential (primary) hypertension: Secondary | ICD-10-CM | POA: Diagnosis not present

## 2024-04-21 DIAGNOSIS — K5900 Constipation, unspecified: Secondary | ICD-10-CM | POA: Diagnosis not present

## 2024-04-25 DIAGNOSIS — Z556 Problems related to health literacy: Secondary | ICD-10-CM | POA: Diagnosis not present

## 2024-04-25 DIAGNOSIS — G4733 Obstructive sleep apnea (adult) (pediatric): Secondary | ICD-10-CM | POA: Diagnosis not present

## 2024-04-25 DIAGNOSIS — I10 Essential (primary) hypertension: Secondary | ICD-10-CM | POA: Diagnosis not present

## 2024-04-25 DIAGNOSIS — Z471 Aftercare following joint replacement surgery: Secondary | ICD-10-CM | POA: Diagnosis not present

## 2024-04-25 DIAGNOSIS — Z96652 Presence of left artificial knee joint: Secondary | ICD-10-CM | POA: Diagnosis not present

## 2024-04-25 DIAGNOSIS — Z9089 Acquired absence of other organs: Secondary | ICD-10-CM | POA: Diagnosis not present

## 2024-04-25 DIAGNOSIS — Z7951 Long term (current) use of inhaled steroids: Secondary | ICD-10-CM | POA: Diagnosis not present

## 2024-04-25 DIAGNOSIS — E119 Type 2 diabetes mellitus without complications: Secondary | ICD-10-CM | POA: Diagnosis not present

## 2024-04-25 DIAGNOSIS — G5603 Carpal tunnel syndrome, bilateral upper limbs: Secondary | ICD-10-CM | POA: Diagnosis not present

## 2024-04-25 DIAGNOSIS — K5900 Constipation, unspecified: Secondary | ICD-10-CM | POA: Diagnosis not present

## 2024-04-25 DIAGNOSIS — Z8701 Personal history of pneumonia (recurrent): Secondary | ICD-10-CM | POA: Diagnosis not present

## 2024-04-25 DIAGNOSIS — F5101 Primary insomnia: Secondary | ICD-10-CM | POA: Diagnosis not present

## 2024-04-25 DIAGNOSIS — Z7982 Long term (current) use of aspirin: Secondary | ICD-10-CM | POA: Diagnosis not present

## 2024-04-25 DIAGNOSIS — Z96651 Presence of right artificial knee joint: Secondary | ICD-10-CM | POA: Diagnosis not present

## 2024-04-25 DIAGNOSIS — Z9181 History of falling: Secondary | ICD-10-CM | POA: Diagnosis not present

## 2024-04-25 DIAGNOSIS — E669 Obesity, unspecified: Secondary | ICD-10-CM | POA: Diagnosis not present

## 2024-04-25 DIAGNOSIS — F319 Bipolar disorder, unspecified: Secondary | ICD-10-CM | POA: Diagnosis not present

## 2024-04-25 DIAGNOSIS — G43909 Migraine, unspecified, not intractable, without status migrainosus: Secondary | ICD-10-CM | POA: Diagnosis not present

## 2024-04-25 DIAGNOSIS — I4711 Inappropriate sinus tachycardia, so stated: Secondary | ICD-10-CM | POA: Diagnosis not present

## 2024-04-25 DIAGNOSIS — K219 Gastro-esophageal reflux disease without esophagitis: Secondary | ICD-10-CM | POA: Diagnosis not present

## 2024-04-25 DIAGNOSIS — J309 Allergic rhinitis, unspecified: Secondary | ICD-10-CM | POA: Diagnosis not present

## 2024-04-27 ENCOUNTER — Telehealth: Payer: Self-pay | Admitting: Orthopaedic Surgery

## 2024-04-27 DIAGNOSIS — F5101 Primary insomnia: Secondary | ICD-10-CM | POA: Diagnosis not present

## 2024-04-27 DIAGNOSIS — J309 Allergic rhinitis, unspecified: Secondary | ICD-10-CM | POA: Diagnosis not present

## 2024-04-27 DIAGNOSIS — Z9089 Acquired absence of other organs: Secondary | ICD-10-CM | POA: Diagnosis not present

## 2024-04-27 DIAGNOSIS — I10 Essential (primary) hypertension: Secondary | ICD-10-CM | POA: Diagnosis not present

## 2024-04-27 DIAGNOSIS — K219 Gastro-esophageal reflux disease without esophagitis: Secondary | ICD-10-CM | POA: Diagnosis not present

## 2024-04-27 DIAGNOSIS — F319 Bipolar disorder, unspecified: Secondary | ICD-10-CM | POA: Diagnosis not present

## 2024-04-27 DIAGNOSIS — Z8701 Personal history of pneumonia (recurrent): Secondary | ICD-10-CM | POA: Diagnosis not present

## 2024-04-27 DIAGNOSIS — E119 Type 2 diabetes mellitus without complications: Secondary | ICD-10-CM | POA: Diagnosis not present

## 2024-04-27 DIAGNOSIS — Z96651 Presence of right artificial knee joint: Secondary | ICD-10-CM | POA: Diagnosis not present

## 2024-04-27 DIAGNOSIS — K5900 Constipation, unspecified: Secondary | ICD-10-CM | POA: Diagnosis not present

## 2024-04-27 DIAGNOSIS — G4733 Obstructive sleep apnea (adult) (pediatric): Secondary | ICD-10-CM | POA: Diagnosis not present

## 2024-04-27 DIAGNOSIS — Z7982 Long term (current) use of aspirin: Secondary | ICD-10-CM | POA: Diagnosis not present

## 2024-04-27 DIAGNOSIS — I4711 Inappropriate sinus tachycardia, so stated: Secondary | ICD-10-CM | POA: Diagnosis not present

## 2024-04-27 DIAGNOSIS — Z7951 Long term (current) use of inhaled steroids: Secondary | ICD-10-CM | POA: Diagnosis not present

## 2024-04-27 DIAGNOSIS — E669 Obesity, unspecified: Secondary | ICD-10-CM | POA: Diagnosis not present

## 2024-04-27 DIAGNOSIS — Z471 Aftercare following joint replacement surgery: Secondary | ICD-10-CM | POA: Diagnosis not present

## 2024-04-27 DIAGNOSIS — G43909 Migraine, unspecified, not intractable, without status migrainosus: Secondary | ICD-10-CM | POA: Diagnosis not present

## 2024-04-27 DIAGNOSIS — Z96652 Presence of left artificial knee joint: Secondary | ICD-10-CM | POA: Diagnosis not present

## 2024-04-27 DIAGNOSIS — G5603 Carpal tunnel syndrome, bilateral upper limbs: Secondary | ICD-10-CM | POA: Diagnosis not present

## 2024-04-27 DIAGNOSIS — Z556 Problems related to health literacy: Secondary | ICD-10-CM | POA: Diagnosis not present

## 2024-04-27 DIAGNOSIS — Z9181 History of falling: Secondary | ICD-10-CM | POA: Diagnosis not present

## 2024-04-27 NOTE — Telephone Encounter (Signed)
 Pt called stating that the pain meds given aren't touching the pain and she can't seem to get her rt knee to bend back. Wants advice as to what to do next. Pt call back number is [phone]

## 2024-04-28 ENCOUNTER — Other Ambulatory Visit: Payer: Self-pay | Admitting: Orthopaedic Surgery

## 2024-04-28 DIAGNOSIS — M62830 Muscle spasm of back: Secondary | ICD-10-CM | POA: Diagnosis not present

## 2024-04-28 DIAGNOSIS — M47816 Spondylosis without myelopathy or radiculopathy, lumbar region: Secondary | ICD-10-CM | POA: Diagnosis not present

## 2024-04-28 DIAGNOSIS — M1711 Unilateral primary osteoarthritis, right knee: Secondary | ICD-10-CM | POA: Diagnosis not present

## 2024-04-28 DIAGNOSIS — G894 Chronic pain syndrome: Secondary | ICD-10-CM | POA: Diagnosis not present

## 2024-04-28 MED ORDER — METHOCARBAMOL 750 MG PO TABS: 750.0000 mg | ORAL_TABLET | Freq: Four times a day (QID) | ORAL | 1 refills | Status: DC | PRN

## 2024-05-02 DIAGNOSIS — Z7982 Long term (current) use of aspirin: Secondary | ICD-10-CM | POA: Diagnosis not present

## 2024-05-02 DIAGNOSIS — K5900 Constipation, unspecified: Secondary | ICD-10-CM | POA: Diagnosis not present

## 2024-05-02 DIAGNOSIS — F5101 Primary insomnia: Secondary | ICD-10-CM | POA: Diagnosis not present

## 2024-05-02 DIAGNOSIS — Z96652 Presence of left artificial knee joint: Secondary | ICD-10-CM | POA: Diagnosis not present

## 2024-05-02 DIAGNOSIS — Z471 Aftercare following joint replacement surgery: Secondary | ICD-10-CM | POA: Diagnosis not present

## 2024-05-02 DIAGNOSIS — G5603 Carpal tunnel syndrome, bilateral upper limbs: Secondary | ICD-10-CM | POA: Diagnosis not present

## 2024-05-02 DIAGNOSIS — E119 Type 2 diabetes mellitus without complications: Secondary | ICD-10-CM | POA: Diagnosis not present

## 2024-05-02 DIAGNOSIS — I4711 Inappropriate sinus tachycardia, so stated: Secondary | ICD-10-CM | POA: Diagnosis not present

## 2024-05-02 DIAGNOSIS — J309 Allergic rhinitis, unspecified: Secondary | ICD-10-CM | POA: Diagnosis not present

## 2024-05-02 DIAGNOSIS — Z7951 Long term (current) use of inhaled steroids: Secondary | ICD-10-CM | POA: Diagnosis not present

## 2024-05-02 DIAGNOSIS — Z8701 Personal history of pneumonia (recurrent): Secondary | ICD-10-CM | POA: Diagnosis not present

## 2024-05-02 DIAGNOSIS — K219 Gastro-esophageal reflux disease without esophagitis: Secondary | ICD-10-CM | POA: Diagnosis not present

## 2024-05-02 DIAGNOSIS — G4733 Obstructive sleep apnea (adult) (pediatric): Secondary | ICD-10-CM | POA: Diagnosis not present

## 2024-05-02 DIAGNOSIS — F319 Bipolar disorder, unspecified: Secondary | ICD-10-CM | POA: Diagnosis not present

## 2024-05-02 DIAGNOSIS — Z556 Problems related to health literacy: Secondary | ICD-10-CM | POA: Diagnosis not present

## 2024-05-02 DIAGNOSIS — F3175 Bipolar disorder, in partial remission, most recent episode depressed: Secondary | ICD-10-CM | POA: Diagnosis not present

## 2024-05-02 DIAGNOSIS — I10 Essential (primary) hypertension: Secondary | ICD-10-CM | POA: Diagnosis not present

## 2024-05-02 DIAGNOSIS — Z9181 History of falling: Secondary | ICD-10-CM | POA: Diagnosis not present

## 2024-05-02 DIAGNOSIS — G43909 Migraine, unspecified, not intractable, without status migrainosus: Secondary | ICD-10-CM | POA: Diagnosis not present

## 2024-05-02 DIAGNOSIS — E669 Obesity, unspecified: Secondary | ICD-10-CM | POA: Diagnosis not present

## 2024-05-02 DIAGNOSIS — Z96651 Presence of right artificial knee joint: Secondary | ICD-10-CM | POA: Diagnosis not present

## 2024-05-02 DIAGNOSIS — Z9089 Acquired absence of other organs: Secondary | ICD-10-CM | POA: Diagnosis not present

## 2024-05-05 DIAGNOSIS — I4711 Inappropriate sinus tachycardia, so stated: Secondary | ICD-10-CM | POA: Diagnosis not present

## 2024-05-05 DIAGNOSIS — Z9089 Acquired absence of other organs: Secondary | ICD-10-CM | POA: Diagnosis not present

## 2024-05-05 DIAGNOSIS — Z471 Aftercare following joint replacement surgery: Secondary | ICD-10-CM | POA: Diagnosis not present

## 2024-05-05 DIAGNOSIS — G4733 Obstructive sleep apnea (adult) (pediatric): Secondary | ICD-10-CM | POA: Diagnosis not present

## 2024-05-05 DIAGNOSIS — Z7982 Long term (current) use of aspirin: Secondary | ICD-10-CM | POA: Diagnosis not present

## 2024-05-05 DIAGNOSIS — K5900 Constipation, unspecified: Secondary | ICD-10-CM | POA: Diagnosis not present

## 2024-05-05 DIAGNOSIS — K219 Gastro-esophageal reflux disease without esophagitis: Secondary | ICD-10-CM | POA: Diagnosis not present

## 2024-05-05 DIAGNOSIS — Z9181 History of falling: Secondary | ICD-10-CM | POA: Diagnosis not present

## 2024-05-05 DIAGNOSIS — G5603 Carpal tunnel syndrome, bilateral upper limbs: Secondary | ICD-10-CM | POA: Diagnosis not present

## 2024-05-05 DIAGNOSIS — G43909 Migraine, unspecified, not intractable, without status migrainosus: Secondary | ICD-10-CM | POA: Diagnosis not present

## 2024-05-05 DIAGNOSIS — Z96651 Presence of right artificial knee joint: Secondary | ICD-10-CM | POA: Diagnosis not present

## 2024-05-05 DIAGNOSIS — F319 Bipolar disorder, unspecified: Secondary | ICD-10-CM | POA: Diagnosis not present

## 2024-05-05 DIAGNOSIS — F5101 Primary insomnia: Secondary | ICD-10-CM | POA: Diagnosis not present

## 2024-05-05 DIAGNOSIS — Z96652 Presence of left artificial knee joint: Secondary | ICD-10-CM | POA: Diagnosis not present

## 2024-05-05 DIAGNOSIS — Z8701 Personal history of pneumonia (recurrent): Secondary | ICD-10-CM | POA: Diagnosis not present

## 2024-05-05 DIAGNOSIS — E119 Type 2 diabetes mellitus without complications: Secondary | ICD-10-CM | POA: Diagnosis not present

## 2024-05-05 DIAGNOSIS — J309 Allergic rhinitis, unspecified: Secondary | ICD-10-CM | POA: Diagnosis not present

## 2024-05-05 DIAGNOSIS — E669 Obesity, unspecified: Secondary | ICD-10-CM | POA: Diagnosis not present

## 2024-05-05 DIAGNOSIS — Z7951 Long term (current) use of inhaled steroids: Secondary | ICD-10-CM | POA: Diagnosis not present

## 2024-05-05 DIAGNOSIS — I10 Essential (primary) hypertension: Secondary | ICD-10-CM | POA: Diagnosis not present

## 2024-05-05 DIAGNOSIS — Z556 Problems related to health literacy: Secondary | ICD-10-CM | POA: Diagnosis not present

## 2024-05-06 DIAGNOSIS — F4323 Adjustment disorder with mixed anxiety and depressed mood: Secondary | ICD-10-CM | POA: Diagnosis not present

## 2024-05-06 DIAGNOSIS — F4320 Adjustment disorder, unspecified: Secondary | ICD-10-CM | POA: Diagnosis not present

## 2024-05-06 DIAGNOSIS — Z5181 Encounter for therapeutic drug level monitoring: Secondary | ICD-10-CM | POA: Diagnosis not present

## 2024-05-06 DIAGNOSIS — F3175 Bipolar disorder, in partial remission, most recent episode depressed: Secondary | ICD-10-CM | POA: Diagnosis not present

## 2024-05-09 ENCOUNTER — Encounter: Payer: Self-pay | Admitting: Radiology

## 2024-05-09 DIAGNOSIS — Z96652 Presence of left artificial knee joint: Secondary | ICD-10-CM | POA: Diagnosis not present

## 2024-05-09 DIAGNOSIS — Z7982 Long term (current) use of aspirin: Secondary | ICD-10-CM | POA: Diagnosis not present

## 2024-05-09 DIAGNOSIS — G4733 Obstructive sleep apnea (adult) (pediatric): Secondary | ICD-10-CM | POA: Diagnosis not present

## 2024-05-09 DIAGNOSIS — Z471 Aftercare following joint replacement surgery: Secondary | ICD-10-CM | POA: Diagnosis not present

## 2024-05-09 DIAGNOSIS — Z7951 Long term (current) use of inhaled steroids: Secondary | ICD-10-CM | POA: Diagnosis not present

## 2024-05-09 DIAGNOSIS — Z556 Problems related to health literacy: Secondary | ICD-10-CM | POA: Diagnosis not present

## 2024-05-09 DIAGNOSIS — I4711 Inappropriate sinus tachycardia, so stated: Secondary | ICD-10-CM | POA: Diagnosis not present

## 2024-05-09 DIAGNOSIS — G5603 Carpal tunnel syndrome, bilateral upper limbs: Secondary | ICD-10-CM | POA: Diagnosis not present

## 2024-05-09 DIAGNOSIS — Z9089 Acquired absence of other organs: Secondary | ICD-10-CM | POA: Diagnosis not present

## 2024-05-09 DIAGNOSIS — Z8701 Personal history of pneumonia (recurrent): Secondary | ICD-10-CM | POA: Diagnosis not present

## 2024-05-09 DIAGNOSIS — G43909 Migraine, unspecified, not intractable, without status migrainosus: Secondary | ICD-10-CM | POA: Diagnosis not present

## 2024-05-09 DIAGNOSIS — K5900 Constipation, unspecified: Secondary | ICD-10-CM | POA: Diagnosis not present

## 2024-05-09 DIAGNOSIS — E119 Type 2 diabetes mellitus without complications: Secondary | ICD-10-CM | POA: Diagnosis not present

## 2024-05-09 DIAGNOSIS — F5101 Primary insomnia: Secondary | ICD-10-CM | POA: Diagnosis not present

## 2024-05-09 DIAGNOSIS — K219 Gastro-esophageal reflux disease without esophagitis: Secondary | ICD-10-CM | POA: Diagnosis not present

## 2024-05-09 DIAGNOSIS — J309 Allergic rhinitis, unspecified: Secondary | ICD-10-CM | POA: Diagnosis not present

## 2024-05-09 DIAGNOSIS — I10 Essential (primary) hypertension: Secondary | ICD-10-CM | POA: Diagnosis not present

## 2024-05-09 DIAGNOSIS — F319 Bipolar disorder, unspecified: Secondary | ICD-10-CM | POA: Diagnosis not present

## 2024-05-09 DIAGNOSIS — Z9181 History of falling: Secondary | ICD-10-CM | POA: Diagnosis not present

## 2024-05-09 DIAGNOSIS — E669 Obesity, unspecified: Secondary | ICD-10-CM | POA: Diagnosis not present

## 2024-05-09 DIAGNOSIS — Z96651 Presence of right artificial knee joint: Secondary | ICD-10-CM | POA: Diagnosis not present

## 2024-05-13 DIAGNOSIS — Z96651 Presence of right artificial knee joint: Secondary | ICD-10-CM | POA: Diagnosis not present

## 2024-05-13 DIAGNOSIS — I10 Essential (primary) hypertension: Secondary | ICD-10-CM | POA: Diagnosis not present

## 2024-05-13 DIAGNOSIS — Z96652 Presence of left artificial knee joint: Secondary | ICD-10-CM | POA: Diagnosis not present

## 2024-05-13 DIAGNOSIS — Z7951 Long term (current) use of inhaled steroids: Secondary | ICD-10-CM | POA: Diagnosis not present

## 2024-05-13 DIAGNOSIS — G4733 Obstructive sleep apnea (adult) (pediatric): Secondary | ICD-10-CM | POA: Diagnosis not present

## 2024-05-13 DIAGNOSIS — I4711 Inappropriate sinus tachycardia, so stated: Secondary | ICD-10-CM | POA: Diagnosis not present

## 2024-05-13 DIAGNOSIS — E669 Obesity, unspecified: Secondary | ICD-10-CM | POA: Diagnosis not present

## 2024-05-13 DIAGNOSIS — F319 Bipolar disorder, unspecified: Secondary | ICD-10-CM | POA: Diagnosis not present

## 2024-05-13 DIAGNOSIS — K219 Gastro-esophageal reflux disease without esophagitis: Secondary | ICD-10-CM | POA: Diagnosis not present

## 2024-05-13 DIAGNOSIS — Z8701 Personal history of pneumonia (recurrent): Secondary | ICD-10-CM | POA: Diagnosis not present

## 2024-05-13 DIAGNOSIS — F5101 Primary insomnia: Secondary | ICD-10-CM | POA: Diagnosis not present

## 2024-05-13 DIAGNOSIS — Z7982 Long term (current) use of aspirin: Secondary | ICD-10-CM | POA: Diagnosis not present

## 2024-05-13 DIAGNOSIS — K5900 Constipation, unspecified: Secondary | ICD-10-CM | POA: Diagnosis not present

## 2024-05-13 DIAGNOSIS — J309 Allergic rhinitis, unspecified: Secondary | ICD-10-CM | POA: Diagnosis not present

## 2024-05-13 DIAGNOSIS — E119 Type 2 diabetes mellitus without complications: Secondary | ICD-10-CM | POA: Diagnosis not present

## 2024-05-13 DIAGNOSIS — G43909 Migraine, unspecified, not intractable, without status migrainosus: Secondary | ICD-10-CM | POA: Diagnosis not present

## 2024-05-13 DIAGNOSIS — Z9181 History of falling: Secondary | ICD-10-CM | POA: Diagnosis not present

## 2024-05-13 DIAGNOSIS — Z471 Aftercare following joint replacement surgery: Secondary | ICD-10-CM | POA: Diagnosis not present

## 2024-05-13 DIAGNOSIS — Z9089 Acquired absence of other organs: Secondary | ICD-10-CM | POA: Diagnosis not present

## 2024-05-13 DIAGNOSIS — Z556 Problems related to health literacy: Secondary | ICD-10-CM | POA: Diagnosis not present

## 2024-05-13 DIAGNOSIS — G5603 Carpal tunnel syndrome, bilateral upper limbs: Secondary | ICD-10-CM | POA: Diagnosis not present

## 2024-05-17 ENCOUNTER — Ambulatory Visit (HOSPITAL_COMMUNITY)
Admission: RE | Admit: 2024-05-17 | Discharge: 2024-05-17 | Disposition: A | Discharge status: Home / Self Care | Source: Ambulatory Visit | Attending: Orthopaedic Surgery | Admitting: Orthopaedic Surgery

## 2024-05-17 ENCOUNTER — Telehealth: Payer: Self-pay | Admitting: Orthopaedic Surgery

## 2024-05-17 ENCOUNTER — Other Ambulatory Visit (HOSPITAL_BASED_OUTPATIENT_CLINIC_OR_DEPARTMENT_OTHER): Payer: Self-pay

## 2024-05-17 DIAGNOSIS — Z96651 Presence of right artificial knee joint: Secondary | ICD-10-CM | POA: Insufficient documentation

## 2024-05-17 DIAGNOSIS — Z9089 Acquired absence of other organs: Secondary | ICD-10-CM | POA: Diagnosis not present

## 2024-05-17 DIAGNOSIS — F4323 Adjustment disorder with mixed anxiety and depressed mood: Secondary | ICD-10-CM | POA: Diagnosis not present

## 2024-05-17 DIAGNOSIS — G43909 Migraine, unspecified, not intractable, without status migrainosus: Secondary | ICD-10-CM | POA: Diagnosis not present

## 2024-05-17 DIAGNOSIS — K219 Gastro-esophageal reflux disease without esophagitis: Secondary | ICD-10-CM | POA: Diagnosis not present

## 2024-05-17 DIAGNOSIS — Z7982 Long term (current) use of aspirin: Secondary | ICD-10-CM | POA: Diagnosis not present

## 2024-05-17 DIAGNOSIS — E119 Type 2 diabetes mellitus without complications: Secondary | ICD-10-CM | POA: Diagnosis not present

## 2024-05-17 DIAGNOSIS — F5101 Primary insomnia: Secondary | ICD-10-CM | POA: Diagnosis not present

## 2024-05-17 DIAGNOSIS — G5603 Carpal tunnel syndrome, bilateral upper limbs: Secondary | ICD-10-CM | POA: Diagnosis not present

## 2024-05-17 DIAGNOSIS — Z556 Problems related to health literacy: Secondary | ICD-10-CM | POA: Diagnosis not present

## 2024-05-17 DIAGNOSIS — I10 Essential (primary) hypertension: Secondary | ICD-10-CM | POA: Diagnosis not present

## 2024-05-17 DIAGNOSIS — K5900 Constipation, unspecified: Secondary | ICD-10-CM | POA: Diagnosis not present

## 2024-05-17 DIAGNOSIS — F319 Bipolar disorder, unspecified: Secondary | ICD-10-CM | POA: Diagnosis not present

## 2024-05-17 DIAGNOSIS — Z7951 Long term (current) use of inhaled steroids: Secondary | ICD-10-CM | POA: Diagnosis not present

## 2024-05-17 DIAGNOSIS — J309 Allergic rhinitis, unspecified: Secondary | ICD-10-CM | POA: Diagnosis not present

## 2024-05-17 DIAGNOSIS — I4711 Inappropriate sinus tachycardia, so stated: Secondary | ICD-10-CM | POA: Diagnosis not present

## 2024-05-17 DIAGNOSIS — Z96652 Presence of left artificial knee joint: Secondary | ICD-10-CM | POA: Diagnosis not present

## 2024-05-17 DIAGNOSIS — E669 Obesity, unspecified: Secondary | ICD-10-CM | POA: Diagnosis not present

## 2024-05-17 DIAGNOSIS — G4733 Obstructive sleep apnea (adult) (pediatric): Secondary | ICD-10-CM | POA: Diagnosis not present

## 2024-05-17 DIAGNOSIS — Z471 Aftercare following joint replacement surgery: Secondary | ICD-10-CM | POA: Diagnosis not present

## 2024-05-17 DIAGNOSIS — Z8701 Personal history of pneumonia (recurrent): Secondary | ICD-10-CM | POA: Diagnosis not present

## 2024-05-17 DIAGNOSIS — Z9181 History of falling: Secondary | ICD-10-CM | POA: Diagnosis not present

## 2024-05-17 NOTE — Telephone Encounter (Signed)
 Nat from [facility] health called. She would like an order sent in for DVT on her knee. Possible blood clot. Her cb# [phone]

## 2024-05-18 ENCOUNTER — Encounter: Payer: Self-pay | Admitting: Physician Assistant

## 2024-05-18 ENCOUNTER — Ambulatory Visit (INDEPENDENT_AMBULATORY_CARE_PROVIDER_SITE_OTHER): Admitting: Physician Assistant

## 2024-05-18 DIAGNOSIS — Z96651 Presence of right artificial knee joint: Secondary | ICD-10-CM

## 2024-05-18 NOTE — Progress Notes (Signed)
 HPI: Mrs. Woodside returns today now 6 weeks status post right total knee arthroplasty.  She still having 10 out of 10 pain in the knee.  Continues to work with therapy.  Pain management is managing her pain medications.  She does state however she is sleeping better.  She underwent a Doppler yesterday to rule out DVT and this was negative for DVT.  Most of her pain seems to be in the posterior aspect of the knee.  Physical exam: Right knee surgical incisions well-healed no signs of dehiscence or infection.  Full extension flexion to approximately 80 degrees no gross instability valgus varus stressing.  Right calf supple nontender.  Dorsiflexion plantarflexion right ankle intact.  Impression: Status post right total knee arthroplasty 04/05/2024  Plan: Will have her continue work on range of motion and strengthening discussed with her ways to gain flexion.  Scar tissue mobilization encouraged.  Follow-up with Dr. Vernetta in 2 weeks to see how she is doing overall.  Questions were encouraged and answered.

## 2024-05-19 DIAGNOSIS — K219 Gastro-esophageal reflux disease without esophagitis: Secondary | ICD-10-CM | POA: Diagnosis not present

## 2024-05-19 DIAGNOSIS — Z7951 Long term (current) use of inhaled steroids: Secondary | ICD-10-CM | POA: Diagnosis not present

## 2024-05-19 DIAGNOSIS — F319 Bipolar disorder, unspecified: Secondary | ICD-10-CM | POA: Diagnosis not present

## 2024-05-19 DIAGNOSIS — E119 Type 2 diabetes mellitus without complications: Secondary | ICD-10-CM | POA: Diagnosis not present

## 2024-05-19 DIAGNOSIS — Z9181 History of falling: Secondary | ICD-10-CM | POA: Diagnosis not present

## 2024-05-19 DIAGNOSIS — J309 Allergic rhinitis, unspecified: Secondary | ICD-10-CM | POA: Diagnosis not present

## 2024-05-19 DIAGNOSIS — G43909 Migraine, unspecified, not intractable, without status migrainosus: Secondary | ICD-10-CM | POA: Diagnosis not present

## 2024-05-19 DIAGNOSIS — E669 Obesity, unspecified: Secondary | ICD-10-CM | POA: Diagnosis not present

## 2024-05-19 DIAGNOSIS — I4711 Inappropriate sinus tachycardia, so stated: Secondary | ICD-10-CM | POA: Diagnosis not present

## 2024-05-19 DIAGNOSIS — Z471 Aftercare following joint replacement surgery: Secondary | ICD-10-CM | POA: Diagnosis not present

## 2024-05-19 DIAGNOSIS — Z8701 Personal history of pneumonia (recurrent): Secondary | ICD-10-CM | POA: Diagnosis not present

## 2024-05-19 DIAGNOSIS — Z556 Problems related to health literacy: Secondary | ICD-10-CM | POA: Diagnosis not present

## 2024-05-19 DIAGNOSIS — G4733 Obstructive sleep apnea (adult) (pediatric): Secondary | ICD-10-CM | POA: Diagnosis not present

## 2024-05-19 DIAGNOSIS — K5900 Constipation, unspecified: Secondary | ICD-10-CM | POA: Diagnosis not present

## 2024-05-19 DIAGNOSIS — F5101 Primary insomnia: Secondary | ICD-10-CM | POA: Diagnosis not present

## 2024-05-19 DIAGNOSIS — Z7982 Long term (current) use of aspirin: Secondary | ICD-10-CM | POA: Diagnosis not present

## 2024-05-19 DIAGNOSIS — Z9089 Acquired absence of other organs: Secondary | ICD-10-CM | POA: Diagnosis not present

## 2024-05-19 DIAGNOSIS — Z96652 Presence of left artificial knee joint: Secondary | ICD-10-CM | POA: Diagnosis not present

## 2024-05-19 DIAGNOSIS — G5603 Carpal tunnel syndrome, bilateral upper limbs: Secondary | ICD-10-CM | POA: Diagnosis not present

## 2024-05-19 DIAGNOSIS — I10 Essential (primary) hypertension: Secondary | ICD-10-CM | POA: Diagnosis not present

## 2024-05-19 DIAGNOSIS — Z96651 Presence of right artificial knee joint: Secondary | ICD-10-CM | POA: Diagnosis not present

## 2024-05-23 DIAGNOSIS — Z471 Aftercare following joint replacement surgery: Secondary | ICD-10-CM | POA: Diagnosis not present

## 2024-05-23 DIAGNOSIS — Z7951 Long term (current) use of inhaled steroids: Secondary | ICD-10-CM | POA: Diagnosis not present

## 2024-05-23 DIAGNOSIS — G5603 Carpal tunnel syndrome, bilateral upper limbs: Secondary | ICD-10-CM | POA: Diagnosis not present

## 2024-05-23 DIAGNOSIS — J309 Allergic rhinitis, unspecified: Secondary | ICD-10-CM | POA: Diagnosis not present

## 2024-05-23 DIAGNOSIS — Z556 Problems related to health literacy: Secondary | ICD-10-CM | POA: Diagnosis not present

## 2024-05-23 DIAGNOSIS — Z9089 Acquired absence of other organs: Secondary | ICD-10-CM | POA: Diagnosis not present

## 2024-05-23 DIAGNOSIS — F5101 Primary insomnia: Secondary | ICD-10-CM | POA: Diagnosis not present

## 2024-05-23 DIAGNOSIS — Z96651 Presence of right artificial knee joint: Secondary | ICD-10-CM | POA: Diagnosis not present

## 2024-05-23 DIAGNOSIS — G4733 Obstructive sleep apnea (adult) (pediatric): Secondary | ICD-10-CM | POA: Diagnosis not present

## 2024-05-23 DIAGNOSIS — Z7982 Long term (current) use of aspirin: Secondary | ICD-10-CM | POA: Diagnosis not present

## 2024-05-23 DIAGNOSIS — K219 Gastro-esophageal reflux disease without esophagitis: Secondary | ICD-10-CM | POA: Diagnosis not present

## 2024-05-23 DIAGNOSIS — K5900 Constipation, unspecified: Secondary | ICD-10-CM | POA: Diagnosis not present

## 2024-05-23 DIAGNOSIS — E119 Type 2 diabetes mellitus without complications: Secondary | ICD-10-CM | POA: Diagnosis not present

## 2024-05-23 DIAGNOSIS — E669 Obesity, unspecified: Secondary | ICD-10-CM | POA: Diagnosis not present

## 2024-05-23 DIAGNOSIS — Z96652 Presence of left artificial knee joint: Secondary | ICD-10-CM | POA: Diagnosis not present

## 2024-05-23 DIAGNOSIS — I4711 Inappropriate sinus tachycardia, so stated: Secondary | ICD-10-CM | POA: Diagnosis not present

## 2024-05-23 DIAGNOSIS — Z9181 History of falling: Secondary | ICD-10-CM | POA: Diagnosis not present

## 2024-05-23 DIAGNOSIS — I10 Essential (primary) hypertension: Secondary | ICD-10-CM | POA: Diagnosis not present

## 2024-05-23 DIAGNOSIS — Z8701 Personal history of pneumonia (recurrent): Secondary | ICD-10-CM | POA: Diagnosis not present

## 2024-05-23 DIAGNOSIS — F319 Bipolar disorder, unspecified: Secondary | ICD-10-CM | POA: Diagnosis not present

## 2024-05-23 DIAGNOSIS — G43909 Migraine, unspecified, not intractable, without status migrainosus: Secondary | ICD-10-CM | POA: Diagnosis not present

## 2024-05-25 DIAGNOSIS — M62830 Muscle spasm of back: Secondary | ICD-10-CM | POA: Diagnosis not present

## 2024-05-25 DIAGNOSIS — G894 Chronic pain syndrome: Secondary | ICD-10-CM | POA: Diagnosis not present

## 2024-05-25 DIAGNOSIS — M47816 Spondylosis without myelopathy or radiculopathy, lumbar region: Secondary | ICD-10-CM | POA: Diagnosis not present

## 2024-05-25 DIAGNOSIS — M1711 Unilateral primary osteoarthritis, right knee: Secondary | ICD-10-CM | POA: Diagnosis not present

## 2024-05-26 DIAGNOSIS — Z471 Aftercare following joint replacement surgery: Secondary | ICD-10-CM | POA: Diagnosis not present

## 2024-05-26 DIAGNOSIS — F5101 Primary insomnia: Secondary | ICD-10-CM | POA: Diagnosis not present

## 2024-05-26 DIAGNOSIS — G43909 Migraine, unspecified, not intractable, without status migrainosus: Secondary | ICD-10-CM | POA: Diagnosis not present

## 2024-05-26 DIAGNOSIS — E669 Obesity, unspecified: Secondary | ICD-10-CM | POA: Diagnosis not present

## 2024-05-26 DIAGNOSIS — Z9089 Acquired absence of other organs: Secondary | ICD-10-CM | POA: Diagnosis not present

## 2024-05-26 DIAGNOSIS — Z9181 History of falling: Secondary | ICD-10-CM | POA: Diagnosis not present

## 2024-05-26 DIAGNOSIS — G5603 Carpal tunnel syndrome, bilateral upper limbs: Secondary | ICD-10-CM | POA: Diagnosis not present

## 2024-05-26 DIAGNOSIS — Z8701 Personal history of pneumonia (recurrent): Secondary | ICD-10-CM | POA: Diagnosis not present

## 2024-05-26 DIAGNOSIS — G4733 Obstructive sleep apnea (adult) (pediatric): Secondary | ICD-10-CM | POA: Diagnosis not present

## 2024-05-26 DIAGNOSIS — Z96651 Presence of right artificial knee joint: Secondary | ICD-10-CM | POA: Diagnosis not present

## 2024-05-26 DIAGNOSIS — Z96652 Presence of left artificial knee joint: Secondary | ICD-10-CM | POA: Diagnosis not present

## 2024-05-26 DIAGNOSIS — K5900 Constipation, unspecified: Secondary | ICD-10-CM | POA: Diagnosis not present

## 2024-05-26 DIAGNOSIS — Z7982 Long term (current) use of aspirin: Secondary | ICD-10-CM | POA: Diagnosis not present

## 2024-05-26 DIAGNOSIS — Z7951 Long term (current) use of inhaled steroids: Secondary | ICD-10-CM | POA: Diagnosis not present

## 2024-05-26 DIAGNOSIS — J309 Allergic rhinitis, unspecified: Secondary | ICD-10-CM | POA: Diagnosis not present

## 2024-05-26 DIAGNOSIS — K219 Gastro-esophageal reflux disease without esophagitis: Secondary | ICD-10-CM | POA: Diagnosis not present

## 2024-05-26 DIAGNOSIS — I4711 Inappropriate sinus tachycardia, so stated: Secondary | ICD-10-CM | POA: Diagnosis not present

## 2024-05-26 DIAGNOSIS — F319 Bipolar disorder, unspecified: Secondary | ICD-10-CM | POA: Diagnosis not present

## 2024-05-26 DIAGNOSIS — I10 Essential (primary) hypertension: Secondary | ICD-10-CM | POA: Diagnosis not present

## 2024-05-26 DIAGNOSIS — E119 Type 2 diabetes mellitus without complications: Secondary | ICD-10-CM | POA: Diagnosis not present

## 2024-05-26 DIAGNOSIS — Z556 Problems related to health literacy: Secondary | ICD-10-CM | POA: Diagnosis not present

## 2024-05-31 DIAGNOSIS — K219 Gastro-esophageal reflux disease without esophagitis: Secondary | ICD-10-CM | POA: Diagnosis not present

## 2024-05-31 DIAGNOSIS — G43909 Migraine, unspecified, not intractable, without status migrainosus: Secondary | ICD-10-CM | POA: Diagnosis not present

## 2024-05-31 DIAGNOSIS — K5900 Constipation, unspecified: Secondary | ICD-10-CM | POA: Diagnosis not present

## 2024-05-31 DIAGNOSIS — G4733 Obstructive sleep apnea (adult) (pediatric): Secondary | ICD-10-CM | POA: Diagnosis not present

## 2024-05-31 DIAGNOSIS — E119 Type 2 diabetes mellitus without complications: Secondary | ICD-10-CM | POA: Diagnosis not present

## 2024-05-31 DIAGNOSIS — I10 Essential (primary) hypertension: Secondary | ICD-10-CM | POA: Diagnosis not present

## 2024-05-31 DIAGNOSIS — Z556 Problems related to health literacy: Secondary | ICD-10-CM | POA: Diagnosis not present

## 2024-05-31 DIAGNOSIS — Z96651 Presence of right artificial knee joint: Secondary | ICD-10-CM | POA: Diagnosis not present

## 2024-05-31 DIAGNOSIS — E669 Obesity, unspecified: Secondary | ICD-10-CM | POA: Diagnosis not present

## 2024-05-31 DIAGNOSIS — Z8701 Personal history of pneumonia (recurrent): Secondary | ICD-10-CM | POA: Diagnosis not present

## 2024-05-31 DIAGNOSIS — Z471 Aftercare following joint replacement surgery: Secondary | ICD-10-CM | POA: Diagnosis not present

## 2024-05-31 DIAGNOSIS — Z9089 Acquired absence of other organs: Secondary | ICD-10-CM | POA: Diagnosis not present

## 2024-05-31 DIAGNOSIS — Z7951 Long term (current) use of inhaled steroids: Secondary | ICD-10-CM | POA: Diagnosis not present

## 2024-05-31 DIAGNOSIS — Z96652 Presence of left artificial knee joint: Secondary | ICD-10-CM | POA: Diagnosis not present

## 2024-05-31 DIAGNOSIS — I4711 Inappropriate sinus tachycardia, so stated: Secondary | ICD-10-CM | POA: Diagnosis not present

## 2024-05-31 DIAGNOSIS — F5101 Primary insomnia: Secondary | ICD-10-CM | POA: Diagnosis not present

## 2024-05-31 DIAGNOSIS — Z7982 Long term (current) use of aspirin: Secondary | ICD-10-CM | POA: Diagnosis not present

## 2024-05-31 DIAGNOSIS — J309 Allergic rhinitis, unspecified: Secondary | ICD-10-CM | POA: Diagnosis not present

## 2024-05-31 DIAGNOSIS — Z9181 History of falling: Secondary | ICD-10-CM | POA: Diagnosis not present

## 2024-05-31 DIAGNOSIS — G5603 Carpal tunnel syndrome, bilateral upper limbs: Secondary | ICD-10-CM | POA: Diagnosis not present

## 2024-05-31 DIAGNOSIS — F319 Bipolar disorder, unspecified: Secondary | ICD-10-CM | POA: Diagnosis not present

## 2024-06-06 ENCOUNTER — Encounter: Payer: Self-pay | Admitting: Physician Assistant

## 2024-06-06 ENCOUNTER — Ambulatory Visit: Admitting: Physician Assistant

## 2024-06-06 DIAGNOSIS — Z9089 Acquired absence of other organs: Secondary | ICD-10-CM | POA: Diagnosis not present

## 2024-06-06 DIAGNOSIS — E119 Type 2 diabetes mellitus without complications: Secondary | ICD-10-CM | POA: Diagnosis not present

## 2024-06-06 DIAGNOSIS — Z471 Aftercare following joint replacement surgery: Secondary | ICD-10-CM | POA: Diagnosis not present

## 2024-06-06 DIAGNOSIS — G5603 Carpal tunnel syndrome, bilateral upper limbs: Secondary | ICD-10-CM | POA: Diagnosis not present

## 2024-06-06 DIAGNOSIS — F5101 Primary insomnia: Secondary | ICD-10-CM | POA: Diagnosis not present

## 2024-06-06 DIAGNOSIS — G43909 Migraine, unspecified, not intractable, without status migrainosus: Secondary | ICD-10-CM | POA: Diagnosis not present

## 2024-06-06 DIAGNOSIS — Z7951 Long term (current) use of inhaled steroids: Secondary | ICD-10-CM | POA: Diagnosis not present

## 2024-06-06 DIAGNOSIS — J309 Allergic rhinitis, unspecified: Secondary | ICD-10-CM | POA: Diagnosis not present

## 2024-06-06 DIAGNOSIS — I10 Essential (primary) hypertension: Secondary | ICD-10-CM | POA: Diagnosis not present

## 2024-06-06 DIAGNOSIS — Z96651 Presence of right artificial knee joint: Secondary | ICD-10-CM | POA: Diagnosis not present

## 2024-06-06 DIAGNOSIS — Z8701 Personal history of pneumonia (recurrent): Secondary | ICD-10-CM | POA: Diagnosis not present

## 2024-06-06 DIAGNOSIS — K219 Gastro-esophageal reflux disease without esophagitis: Secondary | ICD-10-CM | POA: Diagnosis not present

## 2024-06-06 DIAGNOSIS — K5900 Constipation, unspecified: Secondary | ICD-10-CM | POA: Diagnosis not present

## 2024-06-06 DIAGNOSIS — M24661 Ankylosis, right knee: Secondary | ICD-10-CM

## 2024-06-06 DIAGNOSIS — Z96652 Presence of left artificial knee joint: Secondary | ICD-10-CM | POA: Diagnosis not present

## 2024-06-06 DIAGNOSIS — Z556 Problems related to health literacy: Secondary | ICD-10-CM | POA: Diagnosis not present

## 2024-06-06 DIAGNOSIS — I4711 Inappropriate sinus tachycardia, so stated: Secondary | ICD-10-CM | POA: Diagnosis not present

## 2024-06-06 DIAGNOSIS — Z7982 Long term (current) use of aspirin: Secondary | ICD-10-CM | POA: Diagnosis not present

## 2024-06-06 DIAGNOSIS — G4733 Obstructive sleep apnea (adult) (pediatric): Secondary | ICD-10-CM | POA: Diagnosis not present

## 2024-06-06 DIAGNOSIS — F319 Bipolar disorder, unspecified: Secondary | ICD-10-CM | POA: Diagnosis not present

## 2024-06-06 DIAGNOSIS — Z9181 History of falling: Secondary | ICD-10-CM | POA: Diagnosis not present

## 2024-06-06 DIAGNOSIS — E669 Obesity, unspecified: Secondary | ICD-10-CM | POA: Diagnosis not present

## 2024-06-06 NOTE — Progress Notes (Signed)
 HPI: [name] returns today now 1 day shy of 9 weeks postop from right total knee arthroplasty.  She states that she cannot get beyond 80 degrees in therapy.  She having significant pain in the knee when it is bent while sitting in a chair or car.  Review of systems: No fevers chills.  Physical exam: Right knee surgical incisions healing well.  Near full extension.  Flexion to approximately 80 to 85 degrees passively.  Calf supple nontender.  Impression: Right knee arthrofibrosis Status post right total knee arthroplasty 04/05/2024  Plan: Will set patient up for knee manipulation under anesthesia.  This benefits discussed with patient postoperative protocol discussed with patient.  Questions were encouraged and answered at length.

## 2024-06-09 ENCOUNTER — Other Ambulatory Visit: Payer: Self-pay

## 2024-06-09 ENCOUNTER — Telehealth: Payer: Self-pay

## 2024-06-09 DIAGNOSIS — Z96651 Presence of right artificial knee joint: Secondary | ICD-10-CM

## 2024-06-09 NOTE — Telephone Encounter (Signed)
 I called patient and scheduled right knee manipulation for 06/16/24.  She needs a referral for OP PT so she can get in that day or the next.  Thanks!

## 2024-06-13 DIAGNOSIS — F4323 Adjustment disorder with mixed anxiety and depressed mood: Secondary | ICD-10-CM | POA: Diagnosis not present

## 2024-06-13 NOTE — Progress Notes (Signed)
 Anesthesia Review:  PCP: Cardiologist :  PPM/ ICD: Device Orders: Rep Notified:  Chest x-ray : EKG :  03/25/2024  CT Cors- 2021  Echo : 2014  Monitor- 2021  Stress test: 2015  Cardiac Cath :   Activity level:  Sleep Study/ CPAP : Fasting Blood Sugar :      / Checks Blood Sugar -- times a day:     DM- type  Hgba1c-   Blood Thinner/ Instructions /Last Dose: ASA / Instructions/ Last Dose :    04/05/24- right knee    Fentanyl  patch -

## 2024-06-13 NOTE — Patient Instructions (Signed)
 SURGICAL WAITING ROOM VISITATION  Patients having surgery or a procedure may have no more than 2 support people in the waiting area - these visitors may rotate.    Children under the age of 67 must have an adult with them who is not the patient.  Visitors with respiratory illnesses are discouraged from visiting and should remain at home.  If the patient needs to stay at the hospital during part of their recovery, the visitor guidelines for inpatient rooms apply. Pre-op nurse will coordinate an appropriate time for 1 support person to accompany patient in pre-op.  This support person may not rotate.    Please refer to the [facility] website for the visitor guidelines for Inpatients (after your surgery is over and you are in a regular room).       Your procedure is scheduled on:   06/16/2024    Report to [facility] Main Entrance    Report to admitting at  0730 AM   Call this number if you have problems the morning of surgery [phone]   Do not eat food :After Midnight.   After Midnight you may have the following liquids until _ 0630_____ AM  DAY OF SURGERY  Water  Non-Citrus Juices (without pulp, NO RED-Apple, White grape, White cranberry) Black Coffee (NO MILK/CREAM OR CREAMERS, sugar ok)  Clear Tea (NO MILK/CREAM OR CREAMERS, sugar ok) regular and decaf                             Plain Jell-O (NO RED)                                           Fruit ices (not with fruit pulp, NO RED)                                     Popsicles (NO RED)                                                               Sports drinks like Gatorade (NO RED)                    The day of surgery:  Drink ONE (1) Pre-Surgery Clear Ensure or G2 at   0630AM the morning of surgery. Drink in one sitting. Do not sip.  This drink was given to you during your hospital  pre-op appointment visit. Nothing else to drink after completing the  Pre-Surgery Clear Ensure or G2.          If you have  questions, please contact your surgeon's office.       Oral Hygiene is also important to reduce your risk of infection.                                    Remember - BRUSH YOUR TEETH THE MORNING OF SURGERY WITH YOUR REGULAR TOOTHPASTE  DENTURES WILL BE REMOVED PRIOR TO SURGERY PLEASE DO NOT APPLY Poly  grip OR ADHESIVES!!!   Do NOT smoke after Midnight   Stop all vitamins and herbal supplements 7 days before surgery.   Take these medicines the morning of surgery with A SIP OF WATER :  inhalers as usual and bring, xanax  if needed, wellbutrin , protonix , lexapro , magnesium   DO NOT TAKE ANY ORAL DIABETIC MEDICATIONS DAY OF YOUR SURGERY  Bring CPAP mask and tubing day of surgery.                              You may not have any metal on your body including hair pins, jewelry, and body piercing             Do not wear make-up, lotions, powders, perfumes/cologne, or deodorant  Do not wear nail polish including gel and S&S, artificial/acrylic nails, or any other type of covering on natural nails including finger and toenails. If you have artificial nails, gel coating, etc. that needs to be removed by a nail salon please have this removed prior to surgery or surgery may need to be canceled/ delayed if the surgeon/ anesthesia feels like they are unable to be safely monitored.   Do not shave  48 hours prior to surgery.               Men may shave face and neck.   Do not bring valuables to the hospital. [facility] IS NOT             RESPONSIBLE   FOR VALUABLES.   Contacts, glasses, dentures or bridgework may not be worn into surgery.   Bring small overnight bag day of surgery.   DO NOT BRING YOUR HOME MEDICATIONS TO THE HOSPITAL. PHARMACY WILL DISPENSE MEDICATIONS LISTED ON YOUR MEDICATION LIST TO YOU DURING YOUR ADMISSION IN THE HOSPITAL!    Patients discharged on the day of surgery will not be allowed to drive home.  Someone NEEDS to stay with you for the first 24 hours after  anesthesia.   Special Instructions: Bring a copy of your healthcare power of attorney and living will documents the day of surgery if you haven't scanned them before.              Please read over the following fact sheets you were given: IF YOU HAVE QUESTIONS ABOUT YOUR PRE-OP INSTRUCTIONS PLEASE CALL [phone].   If you received a COVID test during your pre-op visit  it is requested that you wear a mask when out in public, stay away from anyone that may not be feeling well and notify your surgeon if you develop symptoms. If you test positive for Covid or have been in contact with anyone that has tested positive in the last 10 days please notify you surgeon.    [facility] - Preparing for Surgery Before surgery, you can play an important role.  Because skin is not sterile, your skin needs to be as free of germs as possible.  You can reduce the number of germs on your skin by washing with CHG (chlorahexidine gluconate) soap before surgery.  CHG is an antiseptic cleaner which kills germs and bonds with the skin to continue killing germs even after washing. Please DO NOT use if you have an allergy to CHG or antibacterial soaps.  If your skin becomes reddened/irritated stop using the CHG and inform your nurse when you arrive at Short Stay. Do not shave (including legs and underarms) for at least 48  hours prior to the first CHG shower.  You may shave your face/neck. Please follow these instructions carefully:  1.  Shower with CHG Soap the night before surgery and the  morning of Surgery.  2.  If you choose to wash your hair, wash your hair first as usual with your  normal  shampoo.  3.  After you shampoo, rinse your hair and body thoroughly to remove the  shampoo.                           4.  Use CHG as you would any other liquid soap.  You can apply chg directly  to the skin and wash                       Gently with a scrungie or clean washcloth.  5.  Apply the CHG Soap to your body ONLY FROM THE  NECK DOWN.   Do not use on face/ open                           Wound or open sores. Avoid contact with eyes, ears mouth and genitals (private parts).                       Wash face,  Genitals (private parts) with your normal soap.             6.  Wash thoroughly, paying special attention to the area where your surgery  will be performed.  7.  Thoroughly rinse your body with warm water  from the neck down.  8.  DO NOT shower/wash with your normal soap after using and rinsing off  the CHG Soap.                9.  Pat yourself dry with a clean towel.            10.  Wear clean pajamas.            11.  Place clean sheets on your bed the night of your first shower and do not  sleep with pets. Day of Surgery : Do not apply any lotions/deodorants the morning of surgery.  Please wear clean clothes to the hospital/surgery center.  FAILURE TO FOLLOW THESE INSTRUCTIONS MAY RESULT IN THE CANCELLATION OF YOUR SURGERY PATIENT SIGNATURE_________________________________  NURSE SIGNATURE__________________________________  ________________________________________________________________________

## 2024-06-14 ENCOUNTER — Encounter (HOSPITAL_COMMUNITY)
Admission: RE | Admit: 2024-06-14 | Discharge: 2024-06-14 | Attending: Physician Assistant | Admitting: Physician Assistant

## 2024-06-14 ENCOUNTER — Other Ambulatory Visit: Payer: Self-pay | Admitting: Physician Assistant

## 2024-06-14 ENCOUNTER — Encounter (HOSPITAL_COMMUNITY): Payer: Self-pay

## 2024-06-14 ENCOUNTER — Other Ambulatory Visit: Payer: Self-pay

## 2024-06-14 HISTORY — DX: Prediabetes: R73.03

## 2024-06-14 NOTE — Progress Notes (Signed)
 Need orders in epic.  Surgery on 06/16/2024.  Thank You.

## 2024-06-15 DIAGNOSIS — F3175 Bipolar disorder, in partial remission, most recent episode depressed: Secondary | ICD-10-CM | POA: Diagnosis not present

## 2024-06-15 NOTE — H&P (Signed)
 [name] is an 58 y.o. female.   Chief Complaint: Right knee stiffness and decreased motion HPI: The patient is a 58 year old female who has a history of a recent right total knee arthroplasty.  In spite of physical therapy and attempts to work on range of motion of the knee, she is experience significant arthrofibrosis of that knee.  She does have chronic pain and is in chronic pain management on very high dose narcotics.  This is a contributing factor as well as to her pain level and being able to get her knee bending and moving.  She has developed significant scar tissue within the knee and we have recommended a right knee manipulation under anesthesia.  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   Obesity 07/14/2012   Pneumonia    Polycystic disease, ovaries    Pre-diabetes    Reflux    Sinus tachycardia 07/14/2012   Pt says HR > 100 is consistent    Past Surgical History:  Procedure Laterality Date   COLONOSCOPY     FOOT SURGERY Left    Right plantar facsiitis scrape   KNEE SURGERY Bilateral 2007   Tendon release   KNEE SURGERY Right 04/05/2024   Total R Knee Replacement   LAPAROSCOPIC GASTRIC SLEEVE RESECTION N/A 03/30/2017   Procedure: LAPAROSCOPIC GASTRIC SLEEVE RESECTION, UPPER ENDO;  Surgeon: Tanda Locus, MD;  Location: [facility];  Service: General;  Laterality: N/A;   NASAL SINUS SURGERY  2004   OVARIAN CYST REMOVAL  1998   A cyst on the fallopian tube removed   SPINAL FUSION     TONSILLECTOMY  1996   TOTAL KNEE ARTHROPLASTY  Right 04/05/2024   Procedure: ARTHROPLASTY, KNEE, TOTAL;  Surgeon: Vernetta Lonni GRADE, MD;  Location: [facility] OR;  Service: Orthopedics;  Laterality: Right;    Family History  Problem Relation Age of Onset   Cancer Father        Leukemia   Diabetes Father    Heart disease Father        first PCI in his 63s   Diabetes Mother    Cancer Maternal Grandmother        Stomach   Colon cancer Maternal Grandmother    Cancer Paternal Grandmother    Cancer Paternal Aunt    Diabetes Paternal Aunt    Cancer Paternal Uncle    Cancer Paternal Aunt    Rectal cancer Neg Hx    Stomach cancer Neg Hx    Esophageal cancer Neg Hx    Social History:  reports that she has never smoked. She has never used smokeless tobacco. She reports that she does not drink alcohol and does not use drugs.  Allergies:  Allergies  Allergen Reactions   Carbamazepine Swelling    Rash and tongue swelling   Duloxetine Other (See Comments) and Itching    Confusion/dizziness  Unknown reaction  Unknown reaction Unknown reaction    Confusion/dizziness   Erythromycin Swelling, Nausea And Vomiting and Other (See Comments)   Erythromycin Base Swelling   Gabapentin Nausea And Vomiting and Anaphylaxis   Pineapple Shortness Of Breath and Swelling    Throat swells   Pregabalin Swelling   Shellfish Protein-Containing Drug Products Anaphylaxis   Sulfa  Antibiotics Hives, Shortness Of Breath, Itching, Anxiety and Palpitations   Bee Pollen Other (See Comments)    Stuffy  Nose   Pollen Extract Other (See Comments)    Stuffy  Nose   Peanut (Diagnostic) Rash    No medications prior to admission.    No results found for this or any previous visit (from the past 48 hours). No results found.  Review of Systems  All other systems reviewed and are negative.   Last menstrual period 08/22/2016. Physical Exam Vitals reviewed.  Constitutional:      Appearance: Normal appearance.  HENT:     Head: Normocephalic and  atraumatic.  Eyes:     Extraocular Movements: Extraocular movements intact.     Pupils: Pupils are equal, round, and reactive to light.  Cardiovascular:     Rate and Rhythm: Normal rate.  Pulmonary:     Effort: Pulmonary effort is normal.     Breath sounds: Normal breath sounds.  Abdominal:     Palpations: Abdomen is soft.  Musculoskeletal:     Cervical back: Normal range of motion and neck supple.     Right knee: Decreased range of motion.  Neurological:     Mental Status: She is alert and oriented to person, place, and time.  Psychiatric:        Behavior: Behavior normal.      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  The risks and benefits of the surgery and the rationale have been described in length and detail to the patient.  Lonni CINDERELLA Poli, MD 06/15/2024, 5:29 PM

## 2024-06-16 ENCOUNTER — Ambulatory Visit: Admitting: Rehabilitative and Restorative Service Providers"

## 2024-06-16 ENCOUNTER — Encounter: Admission: RE | Disposition: A | Payer: Self-pay | Attending: Orthopaedic Surgery

## 2024-06-16 ENCOUNTER — Encounter (HOSPITAL_COMMUNITY): Payer: Self-pay | Admitting: Orthopaedic Surgery

## 2024-06-16 ENCOUNTER — Encounter (HOSPITAL_COMMUNITY): Payer: Self-pay | Admitting: Medical

## 2024-06-16 ENCOUNTER — Other Ambulatory Visit: Payer: Self-pay

## 2024-06-16 ENCOUNTER — Ambulatory Visit (HOSPITAL_COMMUNITY)
Admission: RE | Admit: 2024-06-16 | Discharge: 2024-06-16 | Disposition: A | Attending: Orthopaedic Surgery | Admitting: Orthopaedic Surgery

## 2024-06-16 ENCOUNTER — Ambulatory Visit (HOSPITAL_COMMUNITY): Admitting: Anesthesiology

## 2024-06-16 ENCOUNTER — Encounter: Payer: Self-pay | Admitting: Rehabilitative and Restorative Service Providers"

## 2024-06-16 DIAGNOSIS — E119 Type 2 diabetes mellitus without complications: Secondary | ICD-10-CM | POA: Diagnosis not present

## 2024-06-16 DIAGNOSIS — M6281 Muscle weakness (generalized): Secondary | ICD-10-CM

## 2024-06-16 DIAGNOSIS — T8482XA Fibrosis due to internal orthopedic prosthetic devices, implants and grafts, initial encounter: Secondary | ICD-10-CM | POA: Diagnosis not present

## 2024-06-16 DIAGNOSIS — G8929 Other chronic pain: Secondary | ICD-10-CM

## 2024-06-16 DIAGNOSIS — J45909 Unspecified asthma, uncomplicated: Secondary | ICD-10-CM | POA: Diagnosis not present

## 2024-06-16 DIAGNOSIS — I10 Essential (primary) hypertension: Secondary | ICD-10-CM | POA: Diagnosis not present

## 2024-06-16 DIAGNOSIS — M24661 Ankylosis, right knee: Secondary | ICD-10-CM | POA: Diagnosis not present

## 2024-06-16 DIAGNOSIS — Z01818 Encounter for other preprocedural examination: Secondary | ICD-10-CM

## 2024-06-16 DIAGNOSIS — M25661 Stiffness of right knee, not elsewhere classified: Secondary | ICD-10-CM

## 2024-06-16 DIAGNOSIS — M25561 Pain in right knee: Secondary | ICD-10-CM

## 2024-06-16 DIAGNOSIS — Z96651 Presence of right artificial knee joint: Secondary | ICD-10-CM | POA: Diagnosis not present

## 2024-06-16 DIAGNOSIS — Y838 Other surgical procedures as the cause of abnormal reaction of the patient, or of later complication, without mention of misadventure at the time of the procedure: Secondary | ICD-10-CM | POA: Diagnosis not present

## 2024-06-16 DIAGNOSIS — R262 Difficulty in walking, not elsewhere classified: Secondary | ICD-10-CM | POA: Diagnosis not present

## 2024-06-16 DIAGNOSIS — R600 Localized edema: Secondary | ICD-10-CM | POA: Diagnosis not present

## 2024-06-16 DIAGNOSIS — Z79891 Long term (current) use of opiate analgesic: Secondary | ICD-10-CM | POA: Diagnosis not present

## 2024-06-16 DIAGNOSIS — G4730 Sleep apnea, unspecified: Secondary | ICD-10-CM | POA: Diagnosis not present

## 2024-06-16 DIAGNOSIS — F418 Other specified anxiety disorders: Secondary | ICD-10-CM | POA: Diagnosis not present

## 2024-06-16 HISTORY — PX: KNEE CLOSED REDUCTION: SHX995

## 2024-06-16 LAB — BASIC METABOLIC PANEL WITH GFR
Anion gap: 9 (ref 5–15)
BUN: 10 mg/dL (ref 6–20)
CO2: 27 mmol/L (ref 22–32)
Calcium: 9.2 mg/dL (ref 8.9–10.3)
Chloride: 103 mmol/L (ref 98–111)
Creatinine, Ser: 0.58 mg/dL (ref 0.44–1.00)
GFR, Estimated: >60 mL/min (ref >60)
Glucose, Bld: 65 mg/dL — ABNORMAL LOW (ref 70–99)
Potassium: 3.7 mmol/L (ref 3.5–5.1)
Sodium: 139 mmol/L (ref 135–145)

## 2024-06-16 LAB — CBC
HCT: 41.8 % (ref 36.0–46.0)
Hemoglobin: 13.5 g/dL (ref 12.0–15.0)
MCH: 28.9 pg (ref 26.0–34.0)
MCHC: 32.3 g/dL (ref 30.0–36.0)
MCV: 89.5 fL (ref 80.0–100.0)
Platelets: 260 K/uL (ref 150–400)
RBC: 4.67 MIL/uL (ref 3.87–5.11)
RDW: 13.5 % (ref 11.5–15.5)
WBC: 6.5 K/uL (ref 4.0–10.5)
nRBC: 0 % (ref 0.0–0.2)

## 2024-06-16 SURGERY — MANIPULATION, KNEE, CLOSED
Anesthesia: Monitor Anesthesia Care | Site: Knee | Laterality: Right

## 2024-06-16 MED ORDER — PROPOFOL 10 MG/ML IV BOLUS
INTRAVENOUS | Status: DC | PRN
  Administered 2024-06-16 (×2): 100 mg via INTRAVENOUS
  Administered 2024-06-16: 150 ug/kg/min via INTRAVENOUS
  Administered 2024-06-16: 50 mg via INTRAVENOUS

## 2024-06-16 MED ORDER — METHYLPREDNISOLONE ACETATE 40 MG/ML IJ SUSP
INTRAMUSCULAR | Status: AC
  Filled 2024-06-16: qty 1

## 2024-06-16 MED ORDER — PROPOFOL 1000 MG/100ML IV EMUL
INTRAVENOUS | Status: AC
  Filled 2024-06-16: qty 100

## 2024-06-16 MED ORDER — ROPIVACAINE HCL 5 MG/ML IJ SOLN
INTRAMUSCULAR | Status: DC | PRN
  Administered 2024-06-16: 20 mL via PERINEURAL

## 2024-06-16 MED ORDER — PROPOFOL 10 MG/ML IV BOLUS
INTRAVENOUS | Status: AC
  Filled 2024-06-16: qty 20

## 2024-06-16 MED ORDER — AMISULPRIDE (ANTIEMETIC) 5 MG/2ML IV SOLN: 10.0000 mg | Freq: Once | INTRAVENOUS | Status: DC | PRN

## 2024-06-16 MED ORDER — HYDROMORPHONE HCL 1 MG/ML IJ SOLN
INTRAMUSCULAR | Status: AC
  Filled 2024-06-16: qty 1

## 2024-06-16 MED ORDER — OXYCODONE HCL 5 MG/5ML PO SOLN: 5.0000 mg | Freq: Once | ORAL | Status: AC | PRN

## 2024-06-16 MED ORDER — METHYLPREDNISOLONE ACETATE 40 MG/ML IJ SUSP
INTRAMUSCULAR | Status: DC | PRN
  Administered 2024-06-16: 1 mL via INTRA_ARTICULAR

## 2024-06-16 MED ORDER — BUPIVACAINE-EPINEPHRINE 0.25% -1:200000 IJ SOLN
INTRAMUSCULAR | Status: DC | PRN
  Administered 2024-06-16: 4 mL

## 2024-06-16 MED ORDER — ORAL CARE MOUTH RINSE: 15.0000 mL | Freq: Once | OROMUCOSAL | Status: AC

## 2024-06-16 MED ORDER — BUPIVACAINE-EPINEPHRINE (PF) 0.25% -1:200000 IJ SOLN
INTRAMUSCULAR | Status: AC
  Filled 2024-06-16: qty 30

## 2024-06-16 MED ORDER — MIDAZOLAM HCL 2 MG/2ML IJ SOLN
INTRAMUSCULAR | Status: AC
  Filled 2024-06-16: qty 2

## 2024-06-16 MED ORDER — FENTANYL CITRATE (PF) 100 MCG/2ML IJ SOLN
INTRAMUSCULAR | Status: AC
  Filled 2024-06-16: qty 2

## 2024-06-16 MED ORDER — LACTATED RINGERS IV SOLN: INTRAVENOUS | Status: DC

## 2024-06-16 MED ORDER — CHLORHEXIDINE GLUCONATE 0.12 % MT SOLN
15.0000 mL | Freq: Once | OROMUCOSAL | Status: AC
  Administered 2024-06-16: 15 mL via OROMUCOSAL

## 2024-06-16 MED ORDER — HYDROMORPHONE HCL 1 MG/ML IJ SOLN
0.2500 mg | INTRAMUSCULAR | Status: DC | PRN
  Administered 2024-06-16 (×4): 0.5 mg via INTRAVENOUS

## 2024-06-16 MED ORDER — FENTANYL CITRATE (PF) 100 MCG/2ML IJ SOLN
INTRAMUSCULAR | Status: DC | PRN
  Administered 2024-06-16: 50 ug via INTRAVENOUS

## 2024-06-16 MED ORDER — OXYCODONE HCL 5 MG PO TABS
ORAL_TABLET | ORAL | Status: AC
  Filled 2024-06-16: qty 1

## 2024-06-16 MED ORDER — OXYCODONE HCL 5 MG PO TABS
5.0000 mg | ORAL_TABLET | Freq: Once | ORAL | Status: AC | PRN
  Administered 2024-06-16: 5 mg via ORAL

## 2024-06-16 SURGICAL SUPPLY — 11 items
BAG COUNTER SPONGE SURGICOUNT (BAG) IMPLANT
BNDG ADH 1X3 SHEER STRL LF (GAUZE/BANDAGES/DRESSINGS) IMPLANT
COVER SURGICAL LIGHT HANDLE (MISCELLANEOUS) ×1 IMPLANT
GAUZE SPONGE 4X4 12PLY STRL (GAUZE/BANDAGES/DRESSINGS) IMPLANT
GLOVE BIOGEL PI IND STRL 8 (GLOVE) ×1 IMPLANT
GLOVE SURG ORTHO 8.0 STRL STRW (GLOVE) ×1 IMPLANT
GOWN STRL REUS W/ TWL XL LVL3 (GOWN DISPOSABLE) ×1 IMPLANT
NDL SAFETY ECLIP 18X1.5 (MISCELLANEOUS) IMPLANT
PROTECTOR NERVE ULNAR (MISCELLANEOUS) ×1 IMPLANT
SYR CONTROL 10ML LL (SYRINGE) IMPLANT
TOWEL OR DSP ST BLU DLX 10/PK (DISPOSABLE) ×2 IMPLANT

## 2024-06-16 NOTE — Anesthesia Procedure Notes (Signed)
 Anesthesia Regional Block: Adductor canal block   Pre-Anesthetic Checklist: , timeout performed,  Correct Patient, Correct Site, Correct Laterality,  Correct Procedure, Correct Position, site marked,  Risks and benefits discussed,  Surgical consent,  Pre-op evaluation,  At surgeon's request and post-op pain management  Laterality: Right  Prep: chloraprep       Needles:  Injection technique: Single-shot  Needle Type: Stimiplex     Needle Length: 9cm  Needle Gauge: 21     Additional Needles:   Procedures:,,,, ultrasound used (permanent image in chart),,    Narrative:  Start time: 06/16/2024 9:33 AM End time: 06/16/2024 9:38 AM Injection made incrementally with aspirations every 5 mL.  Performed by: Personally  Anesthesiologist: Cleotilde Butler Dade, MD

## 2024-06-16 NOTE — Op Note (Signed)
 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 Vernetta, MD  Anesthesia: #1 mass ventilation and IV sedation, #2 local, #3 regional right lower extremity block  EBL: N/A Antibiotics: None Complications: None  Indications: The patient is a 58 year old female who underwent a right total knee arthroplasty April 05, 2024 secondary to severe and severe arthritis of her right knee.  She is someone who is in chronic pain management and does have severe chronic pain.  Postoperatively she has had a hard time getting her knee flexing in spite of going to physical therapy and pain management.  She presented to the office recently and follow-up with significant lack in knee flexion.  We have recommended at this point a manipulation under anesthesia.  The risks and benefits of this procedure have been described in significant detail including the risk of continued decreased motion, pain and fracture.  She understands that our goals are hopefully improved range of motion of her knee and improve mobility.  Procedure description: After informed consent was obtained and the appropriate right knee was marked, the patient was brought to the operating room on a stretcher and kept on the stretcher.  A mask ventilation and IV sedation was obtained.  A timeout was called and she was notified of the correct patient the correct right knee.  Premanipulation we could only flex the knee to between 50 and 60 degrees.  We then gently push against the knee and felt scar tissue release as we were able to flex her back to almost 120 degrees.  We put her through multiple cycles of range of motion.  Once we had finished the manipulation we did clean the superior lateral aspect of her knee with alcohol swabs and placed a mixture of 4 cc  of Marcaine  with epinephrine  combined with 1 cc of Depo-Medrol .  Anesthesia then came in and placed an adductor canal block right lower extremity.  She was then taken to the recovery room in stable condition.  Postoperatively she will resume outpatient PT hopefully today or tomorrow and then continue pushing herself up with range of motion of that knee.

## 2024-06-16 NOTE — Transfer of Care (Signed)
 Immediate Anesthesia Transfer of Care Note  Patient: [name]  Procedure(s) Performed: MANIPULATION, KNEE, CLOSED (Right: Knee)  Patient Location: PACU  Anesthesia Type:MAC and Regional  Level of Consciousness: awake, alert , oriented, and patient cooperative  Airway & Oxygen Therapy: Patient Spontanous Breathing  Post-op Assessment: Report given to RN and Post -op Vital signs reviewed and stable  Post vital signs: Reviewed and stable  Last Vitals:  Vitals Value Taken Time  BP 164/97 06/16/24 09:37  Temp    Pulse 86 06/16/24 09:39  Resp 24 06/16/24 09:39  SpO2 96 % 06/16/24 09:39  Vitals shown include unfiled device data.  Last Pain:  Vitals:   06/16/24 0758  TempSrc: Oral         Complications: No notable events documented.

## 2024-06-16 NOTE — Anesthesia Preprocedure Evaluation (Addendum)
 Anesthesia Evaluation  Patient identified by MRN, date of birth, ID band Patient awake    Reviewed: Allergy & Precautions, NPO status , Patient's Chart, lab work & pertinent test results  Airway Mallampati: II       Dental  (+) Teeth Intact, Dental Advisory Given   Pulmonary asthma , sleep apnea    breath sounds clear to auscultation       Cardiovascular hypertension,  Rhythm:Regular Rate:Normal     Neuro/Psych  Headaches  Anxiety Depression Bipolar Disorder      GI/Hepatic ,GERD  ,,Hx of gastric sleeve   Endo/Other  diabetes, Type 2    Renal/GU      Musculoskeletal  (+) Arthritis ,  DDD of lumbar spine   Abdominal  (+) + obese  Peds  Hematology  (+) Blood dyscrasia, anemia   Anesthesia Other Findings   Reproductive/Obstetrics                              Anesthesia Physical Anesthesia Plan  ASA: 3  Anesthesia Plan: MAC   Post-op Pain Management:    Induction: Intravenous  PONV Risk Score and Plan: 2 and Propofol  infusion and Treatment may vary due to age or medical condition  Airway Management Planned: Natural Airway and Simple Face Mask  Additional Equipment:   Intra-op Plan:   Post-operative Plan:   Informed Consent:      Dental advisory given  Plan Discussed with: CRNA and Surgeon  Anesthesia Plan Comments:          Anesthesia Quick Evaluation

## 2024-06-16 NOTE — Anesthesia Procedure Notes (Signed)
 Procedure Name: MAC Date/Time: 06/16/2024 9:15 AM  Performed by: Nada Corean CROME, CRNAPre-anesthesia Checklist: Patient identified, Emergency Drugs available, Suction available, Patient being monitored and Timeout performed Patient Re-evaluated:Patient Re-evaluated prior to induction Oxygen Delivery Method: Simple face mask Preoxygenation: Pre-oxygenation with 100% oxygen Induction Type: IV induction Placement Confirmation: positive ETCO2 Dental Injury: Teeth and Oropharynx as per pre-operative assessment

## 2024-06-16 NOTE — Interval H&P Note (Signed)
 History and Physical Interval Note: The patient understands that she is here today for a manipulation under anesthesia of her right stiff total knee arthroplasty with decreased motion.  She has developed postoperative arthrofibrosis likely related to her severe chronic pain.  She has been through physical therapy and is try to push this on her arm but has been unable to get significant flexion of the right knee postoperative.  There has been no acute or interval change in her medical status.  The risks and benefits of this procedure have been discussed in detail and informed consent has been obtained.  The right operative knee has been marked.  06/16/2024 7:52 AM  [name]  has presented today for surgery, with the diagnosis of arthrofibrosis right knee.  The various methods of treatment have been discussed with the patient and family. After consideration of risks, benefits and other options for treatment, the patient has consented to  Procedures: MANIPULATION, KNEE, CLOSED (Right) as a surgical intervention.  The patient's history has been reviewed, patient examined, no change in status, stable for surgery.  I have reviewed the patient's chart and labs.  Questions were answered to the patient's satisfaction.     [name]

## 2024-06-16 NOTE — Anesthesia Postprocedure Evaluation (Signed)
 Anesthesia Post Note  Patient: [name]  Procedure(s) Performed: MANIPULATION, KNEE, CLOSED (Right: Knee)     Patient location during evaluation: PACU Anesthesia Type: MAC Level of consciousness: awake and alert Pain management: pain level controlled Vital Signs Assessment: post-procedure vital signs reviewed and stable Respiratory status: spontaneous breathing, nonlabored ventilation and respiratory function stable Cardiovascular status: blood pressure returned to baseline and stable Postop Assessment: no apparent nausea or vomiting Anesthetic complications: no   No notable events documented.  Last Vitals:  Vitals:   06/16/24 1000 06/16/24 1029  BP: (!) 150/103 (!) 157/82  Pulse: 81 77  Resp: 14 20  Temp:  (!) 36.1 C  SpO2: 95% 93%    Last Pain:  Vitals:   06/16/24 1029  TempSrc: Temporal  PainSc: 0-No pain                 Butler Levander Pinal

## 2024-06-16 NOTE — Discharge Instructions (Signed)
 Do expect right knee pain and swelling. You may put all of your weight on your right leg today as comfort allows. Aggressively work on straightening and flexing/bending your knee multiple times daily. Resume physical therapy on her right knee as soon as possible.

## 2024-06-16 NOTE — Therapy (Signed)
 OUTPATIENT PHYSICAL THERAPY LOWER EXTREMITY EVALUATION   Patient Name: [name] MRN: [id number] DOB:Jan 24, 1966, 58 y.o., female Today's Date: 06/16/2024  END OF SESSION:  PT End of Session - 06/16/24 1703     Visit Number 1    Number of Visits 28    Date for Recertification  08/11/24    Authorization Type [employer] MEDICARE    Progress Note Due on Visit 10    PT Start Time 1600    PT Stop Time 1648    PT Time Calculation (min) 48 min    Activity Tolerance Patient tolerated treatment well;No increased pain;Patient limited by pain    Behavior During Therapy [facility] for tasks assessed/performed          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   Obesity 07/14/2012   Pneumonia    Polycystic disease, ovaries    Pre-diabetes    Reflux    Sinus tachycardia 07/14/2012   Pt says HR > 100 is consistent   Past Surgical History:  Procedure Laterality Date   COLONOSCOPY     FOOT SURGERY Left    Right plantar facsiitis scrape   KNEE SURGERY Bilateral 2007   Tendon release   KNEE SURGERY Right 04/05/2024   Total R Knee Replacement   LAPAROSCOPIC GASTRIC SLEEVE RESECTION N/A 03/30/2017   Procedure: LAPAROSCOPIC GASTRIC SLEEVE RESECTION, UPPER ENDO;  Surgeon: Tanda Locus, MD;  Location: [facility];  Service: General;  Laterality: N/A;   NASAL SINUS SURGERY  2004   OVARIAN CYST REMOVAL  1998   A cyst on the fallopian tube removed   SPINAL FUSION     TONSILLECTOMY  1996   TOTAL KNEE ARTHROPLASTY Right 04/05/2024   Procedure: ARTHROPLASTY,  KNEE, TOTAL;  Surgeon: Vernetta Lonni GRADE, MD;  Location: [facility];  Service: Orthopedics;  Laterality: Right;   Patient Active Problem List   Diagnosis Date Noted   Arthrofibrosis of knee joint, right 06/15/2024   Status post total right knee replacement 04/05/2024   Actinic keratosis 01/18/2024   Absolute anemia 01/18/2024   Pruritus 01/18/2024   Bilateral carpal tunnel syndrome 11/25/2023   History of sleeve gastrectomy 11/25/2023   Generalized anxiety disorder with panic attacks 08/12/2023   Primary insomnia 08/12/2023   Chronic constipation 08/12/2023   Contusion of nose 04/23/2023   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   Chronic migraine without aura, with intractable migraine, so stated, with status migrainosus 01/25/2023   History of psychiatric treatment 01/13/2023   Viral upper respiratory tract infection 03/28/2021   Episodic tension-type headache, not intractable 11/07/2020   Hypertrophy of inferior nasal turbinate 11/07/2020   Postnasal drip 11/07/2020   Lymphadenopathy, anterior cervical 07/13/2020   Sebaceous cyst 07/13/2020   Iron  deficiency anemia secondary to inadequate dietary iron  intake 01/12/2020   Constipation 12/13/2019   Hyperlipidemia LDL goal <130 09/08/2019   Positive colorectal cancer screening using Cologuard test 03/02/2018  Leukocytosis 01/26/2018   Onychomycosis of great toe 12/22/2017   Family history of DVT 03/20/2017   Unilateral primary osteoarthritis, right knee 05/13/2016   Dyslipidemia 03/04/2016   Right knee pain 03/03/2016   Pre-diabetes 11/01/2015   Allergic rhinitis 05/28/2015   Sleep state misperception 07/05/2014   BMI 33.0-33.9,adult 05/24/2013   Polycystic ovary disease 07/14/2012   Obstructive sleep apnea (adult) (pediatric) 02/04/2012   Perimenopausal 11/10/2011   Hot flashes 11/10/2011   Disturbance in sleep behavior 09/16/2011   Asthma 05/23/2011   Backache 05/23/2011   Bipolar disorder in partial remission 05/23/2011    Dysphagia 05/23/2011   Esophageal reflux 05/23/2011   Rhinitis, allergic 05/23/2011    PCP: Camie Moats, PA-C  REFERRING PROVIDER: Lonni CINDERELLA Poli, MD  REFERRING DIAG:  [phone] (ICD-10-CM) - Status post total right knee replacement  Post-manipulation this morning  THERAPY DIAG:  Difficulty in walking, not elsewhere classified - Plan: PT plan of care cert/re-cert  Muscle weakness (generalized) - Plan: PT plan of care cert/re-cert  Localized edema - Plan: PT plan of care cert/re-cert  Chronic pain of right knee - Plan: PT plan of care cert/re-cert  Stiffness of right knee, not elsewhere classified - Plan: PT plan of care cert/re-cert  Rationale for Evaluation and Treatment: Rehabilitation  ONSET DATE: Manipulation today 06/16/2024, TKA 04/05/2024  SUBJECTIVE:   SUBJECTIVE STATEMENT: Vinita had her right knee replaced 04/05/2024.  She only had home health PT due to difficulty getting to outpatient PT.  Her manipulation was this morning and she is motivated to fix her right knee.  PERTINENT HISTORY: OA, asthma, Bipolar, DDD L4-S1, headache, HTN, Lt leg neuropathy, obesity, pre-diabetes, plantar fascia surgery, bilateral knee surgeries, spinal fusion, bilateral carpal tunnel PAIN:  Are you having pain? Yes: NPRS scale: 7-10/10 today Pain location: Rt knee Pain description: Stiff, achy Aggravating factors: Bending, walking, in and out of the car Relieving factors: Ice, ibuprofen, diludid, fednyl  PRECAUTIONS: Back  RED FLAGS: None   WEIGHT BEARING RESTRICTIONS: No  FALLS:  Has patient fallen in last 6 months? No  LIVING ENVIRONMENT: Lives with: lives with their family, lives with their spouse, and mon Lives in: House/apartment Stairs: Difficulty with stairs Has following equipment at home: None  OCCUPATION: Disbailty  PLOF: Needs assistance with ADLs  PATIENT GOALS: Bend the knee normally  NEXT MD VISIT: 06/29/2024  OBJECTIVE:  Note: Objective  measures were completed at Evaluation unless otherwise noted.  DIAGNOSTIC FINDINGS: Right knee arthroplasty without immediate postoperative complication.   PATIENT SURVEYS:  PSFS: THE PATIENT SPECIFIC FUNCTIONAL SCALE  Place score of 0-10 (0 = unable to perform activity and 10 = able to perform activity at the same level as before injury or problem)  Activity Date: 06/16/2024    Walk; get in/out of a vehicle; clothes out of the dryer 1 x 3    2.  Sleep; Walk on uneven ground 0 x 2    3.     4.      Total Score 0.06      Total Score = Sum of activity scores/number of activities  Minimally Detectable Change: 3 points (for single activity); 2 points (for average score)  [employer] [facility] (nd). The Patient Specific Functional Scale . Retrieved from [email or web address]   COGNITION: Overall cognitive status: Within functional limits for tasks assessed     SENSATION: No new tingling or paresthesias  EDEMA:  Noted and not objectively assessed   LOWER EXTREMITY ROM:   AROM Left/Right 06/16/2024  Hip flexion    Hip extension    Hip abduction    Hip adduction    Hip internal rotation    Hip external rotation    Knee flexion 131/70 (Right 95 post exercises)   Knee extension 0/-13 (Right -7 post-exercises)   Ankle dorsiflexion    Ankle plantarflexion    Ankle inversion    Ankle eversion     (Blank rows = not tested)  LOWER EXTREMITY STRENGTH:  MMT Left/Right 06/16/2024   Hip flexion    Hip extension    Hip abduction    Hip adduction    Hip internal rotation    Hip external rotation    Knee flexion    Knee extension 4/2+   Ankle dorsiflexion    Ankle plantarflexion    Ankle inversion    Ankle eversion     (Blank rows = not tested)  GAIT: Distance walked: 50 feet Assistive device utilized: None Level of assistance: Complete Independence Comments: Kelani has limited WB endurance and walks with a very  stiff gait, unable to fully extend her right knee                                                                                                                     TREATMENT DATE:  06/16/2024  Quad sets with right heel prop 10 x 5 seconds Supine knee flexion with strap 10 x 5 seconds Supine knee extension (quad sets, no heel prop) 10 x 5 seconds, between reps of knee flexion with strap Seated tailgate knee flexion 2 minutes Knee flexion AAROM (left pushes right into flexion) 10 x 5 seconds Seated TKE (between sets of AAROM) 10 x 2 seconds  02464: Reviewed exam findings and discussed that although this is a painful process, Shalonda needs to be very aggressive with her PT to return to normal gait and get as much range with her right knee as possible    PATIENT EDUCATION:  Education details: See above Person educated: Patient Education method: Explanation, Demonstration, Tactile cues, Verbal cues, and Handouts Education comprehension: verbal cues required, tactile cues required, and needs further education  HOME EXERCISE PROGRAM: Access Code: [email or web address] URL: [email or web address] Date: 06/16/2024 Prepared by: Lamar Ivory  Exercises - Seated Knee Flexion AAROM  - 10 x daily - 7 x weekly - 1 sets - 10 reps - 5 seconds hold - Supine Heel Slide with Strap  - 10 x daily - 7 x weekly - 1 sets - 10 reps - 5 seconds hold - Supine Quadricep Sets  - 10 x daily - 7 x weekly - 2 sets - 10 reps - 5 second hold - Seated Knee Extension AROM  - 10 x daily - 7 x weekly - 1 sets - 10 reps - 2 seconds hold  ASSESSMENT:  CLINICAL IMPRESSION: Patient is a 58 y.o. female who was seen today for physical therapy evaluation and treatment for  Z96.651 (ICD-10-CM) - Status post total right knee replacement  .  Kolbie had  her manipulation under anesthesia today.  Active range of motion (AROM) will be the primary focus of her early rehabilitation.  OBJECTIVE IMPAIRMENTS: Abnormal gait,  decreased activity tolerance, decreased endurance, decreased mobility, difficulty walking, decreased ROM, decreased strength, increased edema, impaired perceived functional ability, and pain.   ACTIVITY LIMITATIONS: bending, sitting, standing, squatting, sleeping, stairs, dressing, and locomotion level  PARTICIPATION LIMITATIONS: cleaning, driving, and community activity  PERSONAL FACTORS: OA, asthma, Bipolar, DDD L4-S1, headache, HTN, Lt leg neuropathy, obesity, pre-diabetes, plantar fascia surgery, bilateral knee surgeries, spinal fusion, bilateral carpal tunnel are also affecting patient's functional outcome.   REHAB POTENTIAL: Good  CLINICAL DECISION MAKING: Evolving/moderate complexity  EVALUATION COMPLEXITY: Moderate   GOALS: Goals reviewed with patient? Yes  SHORT TERM GOALS: Target date: 07/14/2024 Venus will be independent with her day 1 HEP Baseline: Started 06/16/2024 Goal status: INITIAL  2.  Improve right knee AROM to 0 - 5 - 95 degrees Baseline: 0 - 13 - 70 degrees Goal status: INITIAL  3.  Improve right quadriceps strength as assessed by functional self-report Baseline: 2+/5 Goal status: INITIAL   LONG TERM GOALS: Target date: 08/11/2024  Improve Patient Specific Functional Score to 5 Baseline: 0.6 Goal status: INITIAL  2.  Noni will report right knee pain consistently 0-3/10 on the Visual Analog Scale Baseline: 7-10/10 Goal status: INITIAL  3.  Improve right knee AROM to 0 - 2 - 110 degrees Baseline: 0 - 13 - 70 degrees Goal status: INITIAL  4.  Improve right quadriceps strength to 4/5 MMT Baseline: 2+/5 MMT Goal status: INITIAL  5.  Charnese will be able to ambulate in the community without a noticeable limp due to her range deficit Baseline: Limited by a 13 degree extension deficit Goal status: INITIAL  6.  Nashanti will be independent with her long-term maintenance HEP at [place] Baseline: Started 06/16/2024 Goal status: INITIAL   PLAN:  PT FREQUENCY:  5 x a week for 2 weeks, then 3 x a week for 6 weeks  PT DURATION: 8 weeks  PLANNED INTERVENTIONS: 97750- Physical Performance Testing, 97110-Therapeutic exercises, 97530- Therapeutic activity, [id number]- Neuromuscular re-education, 97535- Self Care, [id number]- Manual therapy, [id number]- Gait training, [phone]- Electrical stimulation (unattended), 97016- Vasopneumatic device, 20560 (1-2 muscles), 20561 (3+ muscles)- Dry Needling, Patient/Family education, Balance training, Stair training, Joint mobilization, and Cryotherapy  PLAN FOR NEXT SESSION: Post-manipulation so heavy active range of motion emphasis.   Myer LELON Ivory, PT, MPT 06/16/2024, 5:18 PM

## 2024-06-17 ENCOUNTER — Encounter (HOSPITAL_COMMUNITY): Payer: Self-pay | Admitting: Orthopaedic Surgery

## 2024-06-17 ENCOUNTER — Ambulatory Visit: Admitting: Rehabilitative and Restorative Service Providers"

## 2024-06-17 DIAGNOSIS — G8929 Other chronic pain: Secondary | ICD-10-CM

## 2024-06-17 DIAGNOSIS — M25561 Pain in right knee: Secondary | ICD-10-CM

## 2024-06-17 DIAGNOSIS — R600 Localized edema: Secondary | ICD-10-CM

## 2024-06-17 DIAGNOSIS — R262 Difficulty in walking, not elsewhere classified: Secondary | ICD-10-CM

## 2024-06-17 DIAGNOSIS — M6281 Muscle weakness (generalized): Secondary | ICD-10-CM

## 2024-06-17 DIAGNOSIS — M25661 Stiffness of right knee, not elsewhere classified: Secondary | ICD-10-CM

## 2024-06-17 NOTE — Therapy (Signed)
 OUTPATIENT PHYSICAL THERAPY LOWER EXTREMITY TREATMENT   Patient Name: [name] MRN: [id number] DOB:Feb 12, 1966, 58 y.o., female Today's Date: 06/17/2024  END OF SESSION:  PT End of Session - 06/17/24 0914     Visit Number 2    Number of Visits 28    Date for Recertification  08/11/24    Authorization Type [employer] MEDICARE    Progress Note Due on Visit 10    PT Start Time 0914    PT Stop Time 1020    PT Time Calculation (min) 66 min    Activity Tolerance Patient tolerated treatment well;No increased pain;Patient limited by pain    Behavior During Therapy [facility] for tasks assessed/performed           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   Obesity 07/14/2012   Pneumonia    Polycystic disease, ovaries    Pre-diabetes    Reflux    Sinus tachycardia 07/14/2012   Pt says HR > 100 is consistent   Past Surgical History:  Procedure Laterality Date   COLONOSCOPY     FOOT SURGERY Left    Right plantar facsiitis scrape   KNEE CLOSED REDUCTION Right 06/16/2024   Procedure: MANIPULATION, KNEE, CLOSED;  Surgeon: Vernetta Lonni GRADE, MD;  Location: [facility];  Service: Orthopedics;  Laterality: Right;   KNEE SURGERY Bilateral 2007   Tendon release   KNEE SURGERY Right 04/05/2024   Total R Knee Replacement   LAPAROSCOPIC GASTRIC SLEEVE RESECTION N/A 03/30/2017   Procedure: LAPAROSCOPIC GASTRIC SLEEVE RESECTION, UPPER ENDO;  Surgeon: Tanda Locus, MD;  Location: [facility];  Service: General;  Laterality: N/A;   NASAL SINUS SURGERY  2004    OVARIAN CYST REMOVAL  1998   A cyst on the fallopian tube removed   SPINAL FUSION     TONSILLECTOMY  1996   TOTAL KNEE ARTHROPLASTY Right 04/05/2024   Procedure: ARTHROPLASTY, KNEE, TOTAL;  Surgeon: Vernetta Lonni GRADE, MD;  Location: [facility];  Service: Orthopedics;  Laterality: Right;   Patient Active Problem List   Diagnosis Date Noted   Arthrofibrosis of knee joint, right 06/15/2024   Status post total right knee replacement 04/05/2024   Actinic keratosis 01/18/2024   Absolute anemia 01/18/2024   Pruritus 01/18/2024   Bilateral carpal tunnel syndrome 11/25/2023   History of sleeve gastrectomy 11/25/2023   Generalized anxiety disorder with panic attacks 08/12/2023   Primary insomnia 08/12/2023   Chronic constipation 08/12/2023   Contusion of nose 04/23/2023   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   Chronic migraine without aura, with intractable migraine, so stated, with status migrainosus 01/25/2023   History of psychiatric treatment 01/13/2023   Viral upper respiratory tract infection 03/28/2021   Episodic tension-type headache, not intractable 11/07/2020   Hypertrophy of inferior nasal turbinate 11/07/2020   Postnasal drip 11/07/2020   Lymphadenopathy, anterior cervical 07/13/2020   Sebaceous cyst 07/13/2020   Iron  deficiency  anemia secondary to inadequate dietary iron  intake 01/12/2020   Constipation 12/13/2019   Hyperlipidemia LDL goal <130 09/08/2019   Positive colorectal cancer screening using Cologuard test 03/02/2018   Leukocytosis 01/26/2018   Onychomycosis of great toe 12/22/2017   Family history of DVT 03/20/2017   Unilateral primary osteoarthritis, right knee 05/13/2016   Dyslipidemia 03/04/2016   Right knee pain 03/03/2016   Pre-diabetes 11/01/2015   Allergic rhinitis 05/28/2015   Sleep state misperception 07/05/2014   BMI 33.0-33.9,adult 05/24/2013   Polycystic ovary disease 07/14/2012   Obstructive sleep apnea (adult) (pediatric) 02/04/2012   Perimenopausal  11/10/2011   Hot flashes 11/10/2011   Disturbance in sleep behavior 09/16/2011   Asthma 05/23/2011   Backache 05/23/2011   Bipolar disorder in partial remission 05/23/2011   Dysphagia 05/23/2011   Esophageal reflux 05/23/2011   Rhinitis, allergic 05/23/2011    PCP: Camie Moats, PA-C  REFERRING PROVIDER: Lonni CINDERELLA Poli, MD  REFERRING DIAG:  [phone] (ICD-10-CM) - Status post total right knee replacement  Post-manipulation this morning  THERAPY DIAG:  Difficulty in walking, not elsewhere classified  Muscle weakness (generalized)  Localized edema  Chronic pain of right knee  Stiffness of right knee, not elsewhere classified  Rationale for Evaluation and Treatment: Rehabilitation  ONSET DATE: Manipulation today 06/16/2024, TKA 04/05/2024  SUBJECTIVE:   SUBJECTIVE STATEMENT: Caitlyn reports 3 time HEP compliance after leaving here late yesterday.  AROM remains the emphasis.  Adrinne had her right knee replaced 04/05/2024.  She only had home health PT due to difficulty getting to outpatient PT.  Her manipulation was this morning and she is motivated to fix her right knee.  PERTINENT HISTORY: OA, asthma, Bipolar, DDD L4-S1, headache, HTN, Lt leg neuropathy, obesity, pre-diabetes, plantar fascia surgery, bilateral knee surgeries, spinal fusion, bilateral carpal tunnel PAIN:  Are you having pain? Yes: NPRS scale: 7-10/10 the past 2 days Pain location: Rt knee Pain description: Stiff, achy Aggravating factors: Bending, walking, in and out of the car Relieving factors: Ice, ibuprofen, diludid, fednyl  PRECAUTIONS: Back  RED FLAGS: None   WEIGHT BEARING RESTRICTIONS: No  FALLS:  Has patient fallen in last 6 months? No  LIVING ENVIRONMENT: Lives with: lives with their family, lives with their spouse, and mon Lives in: House/apartment Stairs: Difficulty with stairs Has following equipment at home: None  OCCUPATION: Disbailty  PLOF: Needs assistance with  ADLs  PATIENT GOALS: Bend the knee normally  NEXT MD VISIT: 06/29/2024  OBJECTIVE:  Note: Objective measures were completed at Evaluation unless otherwise noted.  DIAGNOSTIC FINDINGS: Right knee arthroplasty without immediate postoperative complication.   PATIENT SURVEYS:  PSFS: THE PATIENT SPECIFIC FUNCTIONAL SCALE  Place score of 0-10 (0 = unable to perform activity and 10 = able to perform activity at the same level as before injury or problem)  Activity Date: 06/16/2024    Walk; get in/out of a vehicle; clothes out of the dryer 1 x 3    2.  Sleep; Walk on uneven ground 0 x 2    3.     4.      Total Score 0.06      Total Score = Sum of activity scores/number of activities  Minimally Detectable Change: 3 points (for single activity); 2 points (for average score)  [employer] (nd). The Patient Specific Functional Scale . Retrieved from [email or web address]   COGNITION: Overall cognitive status: Within functional limits for tasks assessed     SENSATION: No new tingling or paresthesias  EDEMA:  Noted and not objectively assessed   LOWER EXTREMITY ROM:   AROM Left/Right 06/16/2024 Right 06/17/2024  Hip flexion    Hip extension    Hip abduction    Hip adduction    Hip internal rotation    Hip external rotation    Knee flexion 131/70 (Right 95 post exercises) 98 with PT assist  Knee extension 0/-13 (Right -7 post-exercises) -7 active  Ankle dorsiflexion    Ankle plantarflexion    Ankle inversion    Ankle eversion     (Blank rows = not tested)  LOWER EXTREMITY STRENGTH:  MMT Left/Right 06/16/2024   Hip flexion    Hip extension    Hip abduction    Hip adduction    Hip internal rotation    Hip external rotation    Knee flexion    Knee extension 4/2+   Ankle dorsiflexion    Ankle plantarflexion    Ankle inversion    Ankle eversion     (Blank rows = not tested)  GAIT: Distance walked: 50  feet Assistive device utilized: None Level of assistance: Complete Independence Comments: Blessen has limited WB endurance and walks with a very stiff gait, unable to fully extend her right knee                                                                                                                     TREATMENT DATE:  06/17/2024 Recumbent bike Seat 8 for 5 minutes active-assisted range of motion Quad sets with right heel prop 10 x 5 seconds Supine knee flexion with strap & PT assist 10 x 10 seconds Supine knee extension (quad sets, no heel prop) with PT assist 10 x 5 seconds, between reps of knee flexion with strap Seated tailgate knee flexion 2 x 1 minute Knee flexion AAROM (left pushes right into flexion) 12 x 10 seconds Seated TKE (between sets of AAROM) 12 x 2 seconds  Functional Activities: Double Leg Press 10 reps at 75# and 10 reps at 100# stretch into flexion and extension, slow eccentrics Single Leg Press 10 reps at 25# stretch into flexion and extension, slow eccentrics  Vaso Right Knee Medium 34* 10 minutes   06/16/2024  Quad sets with right heel prop 10 x 5 seconds Supine knee flexion with strap 10 x 5 seconds Supine knee extension (quad sets, no heel prop) 10 x 5 seconds, between reps of knee flexion with strap Seated tailgate knee flexion 2 minutes Knee flexion AAROM (left pushes right into flexion) 10 x 5 seconds Seated TKE (between sets of AAROM) 10 x 2 seconds  02464: Reviewed exam findings and discussed that although this is a painful process, Tiearra needs to be very aggressive with her PT to return to normal gait and get as much range with her right knee as possible    PATIENT EDUCATION:  Education details: See above Person educated: Patient Education method: Explanation, Demonstration, Tactile cues, Verbal cues, and Handouts Education comprehension: verbal cues required, tactile cues required,  and needs further education  HOME EXERCISE  PROGRAM: Access Code: [email or web address] URL: [email or web address] Date: 06/16/2024 Prepared by: Lamar Ivory  Exercises - Seated Knee Flexion AAROM  - 10 x daily - 7 x weekly - 1 sets - 10 reps - 5 seconds hold - Supine Heel Slide with Strap  - 10 x daily - 7 x weekly - 1 sets - 10 reps - 5 seconds hold - Supine Quadricep Sets  - 10 x daily - 7 x weekly - 2 sets - 10 reps - 5 second hold - Seated Knee Extension AROM  - 10 x daily - 7 x weekly - 1 sets - 10 reps - 2 seconds hold  ASSESSMENT:  CLINICAL IMPRESSION: Active range of motion (AROM) is priority #1 for both flexion and extension.  AROM was 0 - 7 - 98 with PT assistance needed for the 98 degrees of flexion.  Continue typical active range focused activities to meet post-manipulation goals.  Patient is a 58 y.o. female who was seen today for physical therapy evaluation and treatment for  Z96.651 (ICD-10-CM) - Status post total right knee replacement  .  Yuridia had her manipulation under anesthesia today.  Active range of motion (AROM) will be the primary focus of her early rehabilitation.  OBJECTIVE IMPAIRMENTS: Abnormal gait, decreased activity tolerance, decreased endurance, decreased mobility, difficulty walking, decreased ROM, decreased strength, increased edema, impaired perceived functional ability, and pain.   ACTIVITY LIMITATIONS: bending, sitting, standing, squatting, sleeping, stairs, dressing, and locomotion level  PARTICIPATION LIMITATIONS: cleaning, driving, and community activity  PERSONAL FACTORS: OA, asthma, Bipolar, DDD L4-S1, headache, HTN, Lt leg neuropathy, obesity, pre-diabetes, plantar fascia surgery, bilateral knee surgeries, spinal fusion, bilateral carpal tunnel are also affecting patient's functional outcome.   REHAB POTENTIAL: Good  CLINICAL DECISION MAKING: Evolving/moderate complexity  EVALUATION COMPLEXITY: Moderate   GOALS: Goals reviewed with patient? Yes  SHORT TERM GOALS: Target  date: 07/14/2024 Mychaela will be independent with her day 1 HEP Baseline: Started 06/16/2024 Goal status: On Going 06/17/2024  2.  Improve right knee AROM to 0 - 5 - 95 degrees Baseline: 0 - 13 - 70 degrees Goal status: Partially Met 06/17/2024  3.  Improve right quadriceps strength as assessed by functional self-report Baseline: 2+/5 Goal status: INITIAL   LONG TERM GOALS: Target date: 08/11/2024  Improve Patient Specific Functional Score to 5 Baseline: 0.6 Goal status: INITIAL  2.  Britteny will report right knee pain consistently 0-3/10 on the Visual Analog Scale Baseline: 7-10/10 Goal status: INITIAL  3.  Improve right knee AROM to 0 - 2 - 110 degrees Baseline: 0 - 13 - 70 degrees Goal status: INITIAL  4.  Improve right quadriceps strength to 4/5 MMT Baseline: 2+/5 MMT Goal status: INITIAL  5.  Xochilt will be able to ambulate in the community without a noticeable limp due to her range deficit Baseline: Limited by a 13 degree extension deficit Goal status: INITIAL  6.  Iara will be independent with her long-term maintenance HEP at [place] Baseline: Started 06/16/2024 Goal status: INITIAL   PLAN:  PT FREQUENCY: 5 x a week for 2 weeks, then 3 x a week for 6 weeks  PT DURATION: 8 weeks  PLANNED INTERVENTIONS: 97750- Physical Performance Testing, 97110-Therapeutic exercises, 97530- Therapeutic activity, 97112- Neuromuscular re-education, 97535- Self Care, 02859- Manual therapy, [phone]- Gait training, [phone]- Electrical stimulation (unattended), 97016- Vasopneumatic device, 20560 (1-2 muscles), 20561 (3+ muscles)- Dry Needling, Patient/Family education, Balance training, Stair training, Joint mobilization, and Cryotherapy  PLAN  FOR NEXT SESSION: Post-manipulation so heavy passive, active-assisted and active range of motion emphasis.   Myer LELON Ivory, PT, MPT 06/17/2024, 10:14 AM

## 2024-06-19 NOTE — Therapy (Signed)
 OUTPATIENT PHYSICAL THERAPY LOWER EXTREMITY TREATMENT   Patient Name: [name] MRN: [id number] DOB:12/12/1965, 58 y.o., female Today's Date: 06/20/2024  END OF SESSION:  PT End of Session - 06/20/24 1452     Visit Number 3    Number of Visits 28    Date for Recertification  08/11/24    Authorization Type [employer] MEDICARE    PT Start Time 1430    PT Stop Time 1518    PT Time Calculation (min) 48 min    Activity Tolerance Patient tolerated treatment well    Behavior During Therapy [facility] for tasks assessed/performed            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   Obesity 07/14/2012   Pneumonia    Polycystic disease, ovaries    Pre-diabetes    Reflux    Sinus tachycardia 07/14/2012   Pt says HR > 100 is consistent   Past Surgical History:  Procedure Laterality Date   COLONOSCOPY     FOOT SURGERY Left    Right plantar facsiitis scrape   KNEE CLOSED REDUCTION Right 06/16/2024   Procedure: MANIPULATION, KNEE, CLOSED;  Surgeon: [name] [name] GRADE, MD;  Location: [facility];  Service: Orthopedics;  Laterality: Right;   KNEE SURGERY Bilateral 2007   Tendon release   KNEE SURGERY Right 04/05/2024   Total R Knee Replacement   LAPAROSCOPIC GASTRIC SLEEVE RESECTION N/A 03/30/2017   Procedure: LAPAROSCOPIC GASTRIC SLEEVE RESECTION, UPPER ENDO;  Surgeon: [name] Locus, MD;  Location: [facility];  Service: General;  Laterality: N/A;   NASAL SINUS SURGERY  2004   OVARIAN CYST REMOVAL  1998   A cyst on the fallopian tube removed    SPINAL FUSION     TONSILLECTOMY  1996   TOTAL KNEE ARTHROPLASTY Right 04/05/2024   Procedure: ARTHROPLASTY, KNEE, TOTAL;  Surgeon: [name] [name] GRADE, MD;  Location: [facility];  Service: Orthopedics;  Laterality: Right;   Patient Active Problem List   Diagnosis Date Noted   Arthrofibrosis of knee joint, right 06/15/2024   Status post total right knee replacement 04/05/2024   Actinic keratosis 01/18/2024   Absolute anemia 01/18/2024   Pruritus 01/18/2024   Bilateral carpal tunnel syndrome 11/25/2023   History of sleeve gastrectomy 11/25/2023   Generalized anxiety disorder with panic attacks 08/12/2023   Primary insomnia 08/12/2023   Chronic constipation 08/12/2023   Contusion of nose 04/23/2023   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   Chronic migraine without aura, with intractable migraine, so stated, with status migrainosus 01/25/2023   History of psychiatric treatment 01/13/2023   Viral upper respiratory tract infection 03/28/2021   Episodic tension-type headache, not intractable 11/07/2020   Hypertrophy of inferior nasal turbinate 11/07/2020   Postnasal drip 11/07/2020   Lymphadenopathy, anterior cervical 07/13/2020   Sebaceous cyst 07/13/2020   Iron  deficiency anemia secondary to inadequate dietary iron  intake 01/12/2020   Constipation 12/13/2019  Hyperlipidemia LDL goal <130 09/08/2019   Positive colorectal cancer screening using Cologuard test 03/02/2018   Leukocytosis 01/26/2018   Onychomycosis of great toe 12/22/2017   Family history of DVT 03/20/2017   Unilateral primary osteoarthritis, right knee 05/13/2016   Dyslipidemia 03/04/2016   Right knee pain 03/03/2016   Pre-diabetes 11/01/2015   Allergic rhinitis 05/28/2015   Sleep state misperception 07/05/2014   BMI 33.0-33.9,adult 05/24/2013   Polycystic ovary disease 07/14/2012   Obstructive sleep apnea (adult) (pediatric) 02/04/2012   Perimenopausal 11/10/2011   Hot flashes 11/10/2011   Disturbance in sleep behavior  09/16/2011   Asthma 05/23/2011   Backache 05/23/2011   Bipolar disorder in partial remission 05/23/2011   Dysphagia 05/23/2011   Esophageal reflux 05/23/2011   Rhinitis, allergic 05/23/2011    PCP: [name] Moats, PA-C  REFERRING PROVIDER: Lonni CINDERELLA Poli, MD  REFERRING DIAG:  [id number] (ICD-10-CM) - Status post total right knee replacement  Post-manipulation this morning  THERAPY DIAG:  Difficulty in walking, not elsewhere classified  Muscle weakness (generalized)  Localized edema  Chronic pain of right knee  Stiffness of right knee, not elsewhere classified  Rationale for Evaluation and Treatment: Rehabilitation  ONSET DATE: Manipulation today 06/16/2024, TKA 04/05/2024  SUBJECTIVE:   SUBJECTIVE STATEMENT: [name] states its been more sore over the weekend.   [name] had her right knee replaced 04/05/2024.  She only had home health PT due to difficulty getting to outpatient PT.  Her manipulation was this morning and she is motivated to fix her right knee.  PERTINENT HISTORY: OA, asthma, Bipolar, DDD L4-S1, headache, HTN, Lt leg neuropathy, obesity, pre-diabetes, plantar fascia surgery, bilateral knee surgeries, spinal fusion, bilateral carpal tunnel PAIN:  Are you having pain? Yes: NPRS scale: 8.5 /10 today Pain location: Rt knee Pain description: Stiff, achy Aggravating factors: Bending, walking, in and out of the car Relieving factors: Ice, ibuprofen, diludid, fednyl  PRECAUTIONS: Back  RED FLAGS: None   WEIGHT BEARING RESTRICTIONS: No  FALLS:  Has patient fallen in last 6 months? No  LIVING ENVIRONMENT: Lives with: lives with their family, lives with their spouse, and mon Lives in: House/apartment Stairs: Difficulty with stairs Has following equipment at home: None  OCCUPATION: Disbailty  PLOF: Needs assistance with ADLs  PATIENT GOALS: Bend the knee normally  NEXT MD VISIT: 06/29/2024  OBJECTIVE:  Note: Objective measures were completed at  Evaluation unless otherwise noted.  DIAGNOSTIC FINDINGS: Right knee arthroplasty without immediate postoperative complication.   PATIENT SURVEYS:  PSFS: THE PATIENT SPECIFIC FUNCTIONAL SCALE  Place score of 0-10 (0 = unable to perform activity and 10 = able to perform activity at the same level as before injury or problem)  Activity Date: 06/16/2024    Walk; get in/out of a vehicle; clothes out of the dryer 1 x 3    2.  Sleep; Walk on uneven ground 0 x 2    3.     4.      Total Score 0.06      Total Score = Sum of activity scores/number of activities  Minimally Detectable Change: 3 points (for single activity); 2 points (for average score)  [employer] (nd). The Patient Specific Functional Scale . Retrieved from [email or web address]   COGNITION: Overall cognitive status: Within functional limits for tasks assessed     SENSATION: No new tingling or paresthesias  EDEMA:  Noted and not objectively assessed   LOWER EXTREMITY ROM:   AROM Left/Right 06/16/2024 Right 06/17/2024 Right  06/20/24  Hip  flexion     Hip extension     Hip abduction     Hip adduction     Hip internal rotation     Hip external rotation     Knee flexion 131/70 (Right 95 post exercises) 98 with PT assist Supine  80A 95 P  Knee extension 0/-13 (Right -7 post-exercises) -7 active   Ankle dorsiflexion     Ankle plantarflexion     Ankle inversion     Ankle eversion      (Blank rows = not tested)  LOWER EXTREMITY STRENGTH:  MMT Left/Right 06/16/2024   Hip flexion    Hip extension    Hip abduction    Hip adduction    Hip internal rotation    Hip external rotation    Knee flexion    Knee extension 4/2+   Ankle dorsiflexion    Ankle plantarflexion    Ankle inversion    Ankle eversion     (Blank rows = not tested)  GAIT: Distance walked: 50 feet Assistive device utilized: None Level of assistance: Complete  Independence Comments: Ashliegh has limited WB endurance and walks with a very stiff gait, unable to fully extend her right knee                                                                                                                     TREATMENT DATE: R TKA 06/20/24 Sci fit seat 12 no resistance full rotations backwards first then forwards  Leg press #75 3x10 with slow extension and then push onto heels Seated knee flexion with slide board 20x Quad sets with towel roll under ankle 2x10 Vaso Right Knee Medium 34* 10 minutes  06/17/2024 Recumbent bike Seat 8 for 5 minutes active-assisted range of motion Quad sets with right heel prop 10 x 5 seconds Supine knee flexion with strap & PT assist 10 x 10 seconds Supine knee extension (quad sets, no heel prop) with PT assist 10 x 5 seconds, between reps of knee flexion with strap Seated tailgate knee flexion 2 x 1 minute Knee flexion AAROM (left pushes right into flexion) 12 x 10 seconds Seated TKE (between sets of AAROM) 12 x 2 seconds  Functional Activities: Double Leg Press 10 reps at 75# and 10 reps at 100# stretch into flexion and extension, slow eccentrics Single Leg Press 10 reps at 25# stretch into flexion and extension, slow eccentrics  Vaso Right Knee Medium 34* 10 minutes   06/16/2024  Quad sets with right heel prop 10 x 5 seconds Supine knee flexion with strap 10 x 5 seconds Supine knee extension (quad sets, no heel prop) 10 x 5 seconds, between reps of knee flexion with strap Seated tailgate knee flexion 2 minutes Knee flexion AAROM (left pushes right into flexion) 10 x 5 seconds Seated TKE (between sets of AAROM) 10 x 2 seconds  02464: Reviewed exam findings and discussed that although this is a painful process, Joydan needs to be very aggressive with her  PT to return to normal gait and get as much range with her right knee as possible    PATIENT EDUCATION:  Education details: See above Person educated:  Patient Education method: Explanation, Demonstration, Tactile cues, Verbal cues, and Handouts Education comprehension: verbal cues required, tactile cues required, and needs further education  HOME EXERCISE PROGRAM: Access Code: [email or web address] URL: [email or web address] Date: 06/16/2024 Prepared by: Lamar Ivory  Exercises - Seated Knee Flexion AAROM  - 10 x daily - 7 x weekly - 1 sets - 10 reps - 5 seconds hold - Supine Heel Slide with Strap  - 10 x daily - 7 x weekly - 1 sets - 10 reps - 5 seconds hold - Supine Quadricep Sets  - 10 x daily - 7 x weekly - 2 sets - 10 reps - 5 second hold - Seated Knee Extension AROM  - 10 x daily - 7 x weekly - 1 sets - 10 reps - 2 seconds hold  ASSESSMENT:  CLINICAL IMPRESSION: AROM is at 5> 80 in supine with minimal pain.  Passive flexion at 95 supine with moderate pain.    OBJECTIVE IMPAIRMENTS: Abnormal gait, decreased activity tolerance, decreased endurance, decreased mobility, difficulty walking, decreased ROM, decreased strength, increased edema, impaired perceived functional ability, and pain.   ACTIVITY LIMITATIONS: bending, sitting, standing, squatting, sleeping, stairs, dressing, and locomotion level  PARTICIPATION LIMITATIONS: cleaning, driving, and community activity  PERSONAL FACTORS: OA, asthma, Bipolar, DDD L4-S1, headache, HTN, Lt leg neuropathy, obesity, pre-diabetes, plantar fascia surgery, bilateral knee surgeries, spinal fusion, bilateral carpal tunnel are also affecting patient's functional outcome.   REHAB POTENTIAL: Good  CLINICAL DECISION MAKING: Evolving/moderate complexity  EVALUATION COMPLEXITY: Moderate   GOALS: Goals reviewed with patient? Yes  SHORT TERM GOALS: Target date: 07/14/2024 Messiah will be independent with her day 1 HEP Baseline: Started 06/16/2024 Goal status: On Going 06/17/2024  2.  Improve right knee AROM to 0 - 5 - 95 degrees Baseline: 0 - 13 - 70 degrees Goal status: Partially Met  06/17/2024  3.  Improve right quadriceps strength as assessed by functional self-report Baseline: 2+/5 Goal status: INITIAL   LONG TERM GOALS: Target date: 08/11/2024  Improve Patient Specific Functional Score to 5 Baseline: 0.6 Goal status: INITIAL  2.  Jonella will report right knee pain consistently 0-3/10 on the Visual Analog Scale Baseline: 7-10/10 Goal status: INITIAL  3.  Improve right knee AROM to 0 - 2 - 110 degrees Baseline: 0 - 13 - 70 degrees Goal status: INITIAL  4.  Improve right quadriceps strength to 4/5 MMT Baseline: 2+/5 MMT Goal status: INITIAL  5.  Linsie will be able to ambulate in the community without a noticeable limp due to her range deficit Baseline: Limited by a 13 degree extension deficit Goal status: INITIAL  6.  Casy will be independent with her long-term maintenance HEP at [place] Baseline: Started 06/16/2024 Goal status: INITIAL   PLAN:  PT FREQUENCY: 5 x a week for 2 weeks, then 3 x a week for 6 weeks  PT DURATION: 8 weeks  PLANNED INTERVENTIONS: 97750- Physical Performance Testing, 97110-Therapeutic exercises, 97530- Therapeutic activity, [id number]- Neuromuscular re-education, 97535- Self Care, 02859- Manual therapy, [phone]- Gait training, [phone]- Electrical stimulation (unattended), 97016- Vasopneumatic device, 20560 (1-2 muscles), 20561 (3+ muscles)- Dry Needling, Patient/Family education, Balance training, Stair training, Joint mobilization, and Cryotherapy  PLAN FOR NEXT SESSION: Post-manipulation so heavy passive, active-assisted and active range of motion emphasis.   Burnard CHRISTELLA Meth, PT 06/20/2024, 3:20 PM

## 2024-06-20 ENCOUNTER — Ambulatory Visit

## 2024-06-20 DIAGNOSIS — M25561 Pain in right knee: Secondary | ICD-10-CM

## 2024-06-20 DIAGNOSIS — R600 Localized edema: Secondary | ICD-10-CM | POA: Diagnosis not present

## 2024-06-20 DIAGNOSIS — M25661 Stiffness of right knee, not elsewhere classified: Secondary | ICD-10-CM | POA: Diagnosis not present

## 2024-06-20 DIAGNOSIS — M6281 Muscle weakness (generalized): Secondary | ICD-10-CM

## 2024-06-20 DIAGNOSIS — R262 Difficulty in walking, not elsewhere classified: Secondary | ICD-10-CM | POA: Diagnosis not present

## 2024-06-20 DIAGNOSIS — G8929 Other chronic pain: Secondary | ICD-10-CM

## 2024-06-21 ENCOUNTER — Ambulatory Visit

## 2024-06-21 DIAGNOSIS — M25661 Stiffness of right knee, not elsewhere classified: Secondary | ICD-10-CM | POA: Diagnosis not present

## 2024-06-21 DIAGNOSIS — M6281 Muscle weakness (generalized): Secondary | ICD-10-CM | POA: Diagnosis not present

## 2024-06-21 DIAGNOSIS — R600 Localized edema: Secondary | ICD-10-CM

## 2024-06-21 DIAGNOSIS — M25561 Pain in right knee: Secondary | ICD-10-CM | POA: Diagnosis not present

## 2024-06-21 DIAGNOSIS — R262 Difficulty in walking, not elsewhere classified: Secondary | ICD-10-CM | POA: Diagnosis not present

## 2024-06-21 DIAGNOSIS — G8929 Other chronic pain: Secondary | ICD-10-CM | POA: Diagnosis not present

## 2024-06-21 NOTE — Therapy (Signed)
 OUTPATIENT PHYSICAL THERAPY LOWER EXTREMITY TREATMENT   Patient Name: JOLINDA PINKSTAFF MRN: [id number] DOB:May 04, 1966, 58 y.o., female Today's Date: 06/21/2024  END OF SESSION:  PT End of Session - 06/21/24 0711     Visit Number 4    Number of Visits 28    Date for Recertification  08/11/24    Authorization Type [employer] MEDICARE    PT Start Time 0716    PT Stop Time 0810    PT Time Calculation (min) 54 min    Activity Tolerance Patient tolerated treatment well    Behavior During Therapy [facility] for tasks assessed/performed             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   Obesity 07/14/2012   Pneumonia    Polycystic disease, ovaries    Pre-diabetes    Reflux    Sinus tachycardia 07/14/2012   Pt says HR > 100 is consistent   Past Surgical History:  Procedure Laterality Date   COLONOSCOPY     FOOT SURGERY Left    Right plantar facsiitis scrape   KNEE CLOSED REDUCTION Right 06/16/2024   Procedure: MANIPULATION, KNEE, CLOSED;  Surgeon: Vernetta Lonni GRADE, MD;  Location: [facility];  Service: Orthopedics;  Laterality: Right;   KNEE SURGERY Bilateral 2007   Tendon release   KNEE SURGERY Right 04/05/2024   Total R Knee Replacement   LAPAROSCOPIC GASTRIC SLEEVE RESECTION N/A 03/30/2017   Procedure: LAPAROSCOPIC GASTRIC SLEEVE RESECTION, UPPER ENDO;  Surgeon: Tanda Locus, MD;  Location: [facility];  Service: General;  Laterality: N/A;   NASAL SINUS SURGERY  2004   OVARIAN CYST REMOVAL  1998   A cyst on the fallopian tube removed    SPINAL FUSION     TONSILLECTOMY  1996   TOTAL KNEE ARTHROPLASTY Right 04/05/2024   Procedure: ARTHROPLASTY, KNEE, TOTAL;  Surgeon: Vernetta Lonni GRADE, MD;  Location: [facility];  Service: Orthopedics;  Laterality: Right;   Patient Active Problem List   Diagnosis Date Noted   Arthrofibrosis of knee joint, right 06/15/2024   Status post total right knee replacement 04/05/2024   Actinic keratosis 01/18/2024   Absolute anemia 01/18/2024   Pruritus 01/18/2024   Bilateral carpal tunnel syndrome 11/25/2023   History of sleeve gastrectomy 11/25/2023   Generalized anxiety disorder with panic attacks 08/12/2023   Primary insomnia 08/12/2023   Chronic constipation 08/12/2023   Contusion of nose 04/23/2023   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   Chronic migraine without aura, with intractable migraine, so stated, with status migrainosus 01/25/2023   History of psychiatric treatment 01/13/2023   Viral upper respiratory tract infection 03/28/2021   Episodic tension-type headache, not intractable 11/07/2020   Hypertrophy of inferior nasal turbinate 11/07/2020   Postnasal drip 11/07/2020   Lymphadenopathy, anterior cervical 07/13/2020   Sebaceous cyst 07/13/2020   Iron  deficiency anemia secondary to inadequate dietary iron  intake 01/12/2020   Constipation 12/13/2019  Hyperlipidemia LDL goal <130 09/08/2019   Positive colorectal cancer screening using Cologuard test 03/02/2018   Leukocytosis 01/26/2018   Onychomycosis of great toe 12/22/2017   Family history of DVT 03/20/2017   Unilateral primary osteoarthritis, right knee 05/13/2016   Dyslipidemia 03/04/2016   Right knee pain 03/03/2016   Pre-diabetes 11/01/2015   Allergic rhinitis 05/28/2015   Sleep state misperception 07/05/2014   BMI 33.0-33.9,adult 05/24/2013   Polycystic ovary disease 07/14/2012   Obstructive sleep apnea (adult) (pediatric) 02/04/2012   Perimenopausal 11/10/2011   Hot flashes 11/10/2011   Disturbance in sleep behavior  09/16/2011   Asthma 05/23/2011   Backache 05/23/2011   Bipolar disorder in partial remission 05/23/2011   Dysphagia 05/23/2011   Esophageal reflux 05/23/2011   Rhinitis, allergic 05/23/2011    PCP: Camie Moats, PA-C  REFERRING PROVIDER: Lonni CINDERELLA Poli, MD  REFERRING DIAG:  [phone] (ICD-10-CM) - Status post total right knee replacement  Post-manipulation this morning  THERAPY DIAG:  Chronic pain of right knee  Difficulty in walking, not elsewhere classified  Muscle weakness (generalized)  Localized edema  Stiffness of right knee, not elsewhere classified  Rationale for Evaluation and Treatment: Rehabilitation  ONSET DATE: Manipulation today 06/16/2024, TKA 04/05/2024  SUBJECTIVE:   SUBJECTIVE STATEMENT: Patient reports feeling increased tightness/stiffness this morning.  PERTINENT HISTORY: OA, asthma, Bipolar, DDD L4-S1, headache, HTN, Lt leg neuropathy, obesity, pre-diabetes, plantar fascia surgery, bilateral knee surgeries, spinal fusion, bilateral carpal tunnel  Beretta had her right knee replaced 04/05/2024.  She only had home health PT due to difficulty getting to outpatient PT.  Her manipulation was this morning and she is motivated to fix her right knee.  PAIN:  Are you having pain? Yes: NPRS scale: 7 /10 today Pain location: Rt knee Pain description: Stiff, achy Aggravating factors: Bending, walking, in and out of the car Relieving factors: Ice, ibuprofen, diludid, fednyl  PRECAUTIONS: Back  RED FLAGS: None   WEIGHT BEARING RESTRICTIONS: No  FALLS:  Has patient fallen in last 6 months? No  LIVING ENVIRONMENT: Lives with: lives with their family, lives with their spouse, and mon Lives in: House/apartment Stairs: Difficulty with stairs Has following equipment at home: None  OCCUPATION: Disbailty  PLOF: Needs assistance with ADLs  PATIENT GOALS: Bend the knee normally  NEXT MD VISIT: 06/29/2024  OBJECTIVE:  Note: Objective measures  were completed at Evaluation unless otherwise noted.  DIAGNOSTIC FINDINGS: Right knee arthroplasty without immediate postoperative complication.   PATIENT SURVEYS:  PSFS: THE PATIENT SPECIFIC FUNCTIONAL SCALE  Place score of 0-10 (0 = unable to perform activity and 10 = able to perform activity at the same level as before injury or problem)  Activity Date: 06/16/2024    Walk; get in/out of a vehicle; clothes out of the dryer 1 x 3    2.  Sleep; Walk on uneven ground 0 x 2    3.     4.      Total Score 0.06      Total Score = Sum of activity scores/number of activities  Minimally Detectable Change: 3 points (for single activity); 2 points (for average score)  [employer] (nd). The Patient Specific Functional Scale . Retrieved from [email or web address]   COGNITION: Overall cognitive status: Within functional limits for tasks assessed     SENSATION: No new tingling or paresthesias  EDEMA:  Noted and not objectively assessed   LOWER EXTREMITY ROM:   AROM Left/Right 06/16/2024 Right 06/17/2024 Right  06/20/24  Hip flexion  Hip extension     Hip abduction     Hip adduction     Hip internal rotation     Hip external rotation     Knee flexion 131/70 (Right 95 post exercises) 98 with PT assist Supine  80A 95 P  Knee extension 0/-13 (Right -7 post-exercises) -7 active   Ankle dorsiflexion     Ankle plantarflexion     Ankle inversion     Ankle eversion      (Blank rows = not tested)  LOWER EXTREMITY STRENGTH:  MMT Left/Right 06/16/2024   Hip flexion    Hip extension    Hip abduction    Hip adduction    Hip internal rotation    Hip external rotation    Knee flexion    Knee extension 4/2+   Ankle dorsiflexion    Ankle plantarflexion    Ankle inversion    Ankle eversion     (Blank rows = not tested)  GAIT: Distance walked: 50 feet Assistive device utilized: None Level of assistance: Complete  Independence Comments: Delphia has limited WB endurance and walks with a very stiff gait, unable to fully extend her right knee                                                                                                                     TREATMENT DATE: R TKA 06/21/2024 TherEx:  UBE with bilat LE only level 1 for 10 minutes  Verbal cue given for increasing ankle DF  Calf stretch on 6 step 3x30s  Hamstring stretch on 6 step 3x30s  TherAct:  Bilat leg press 2x15 with 75#  Manual:  Seated knee flexion with IR/distraction 1x10 with 10s hold and PT providing PROM knee extension with distraction for 10s between each set   Vaso:  Rt LE elevated on wedge for 10 minutes with medium compression at 34deg   06/20/24 Sci fit seat 12 no resistance full rotations backwards first then forwards  Leg press #75 3x10 with slow extension and then push onto heels Seated knee flexion with slide board 20x Quad sets with towel roll under ankle 2x10 Vaso Right Knee Medium 34* 10 minutes  06/17/2024 Recumbent bike Seat 8 for 5 minutes active-assisted range of motion Quad sets with right heel prop 10 x 5 seconds Supine knee flexion with strap & PT assist 10 x 10 seconds Supine knee extension (quad sets, no heel prop) with PT assist 10 x 5 seconds, between reps of knee flexion with strap Seated tailgate knee flexion 2 x 1 minute Knee flexion AAROM (left pushes right into flexion) 12 x 10 seconds Seated TKE (between sets of AAROM) 12 x 2 seconds  Functional Activities: Double Leg Press 10 reps at 75# and 10 reps at 100# stretch into flexion and extension, slow eccentrics Single Leg Press 10 reps at 25# stretch into flexion and extension, slow eccentrics  Vaso Right Knee Medium 34* 10 minutes   06/16/2024  Quad sets with right heel  prop 10 x 5 seconds Supine knee flexion with strap 10 x 5 seconds Supine knee extension (quad sets, no heel prop) 10 x 5 seconds, between reps of knee flexion  with strap Seated tailgate knee flexion 2 minutes Knee flexion AAROM (left pushes right into flexion) 10 x 5 seconds Seated TKE (between sets of AAROM) 10 x 2 seconds  02464: Reviewed exam findings and discussed that although this is a painful process, Jeriah needs to be very aggressive with her PT to return to normal gait and get as much range with her right knee as possible    PATIENT EDUCATION:  Education details: See above Person educated: Patient Education method: Explanation, Demonstration, Tactile cues, Verbal cues, and Handouts Education comprehension: verbal cues required, tactile cues required, and needs further education  HOME EXERCISE PROGRAM: Access Code: [email or web address] URL: [email or web address] Date: 06/16/2024 Prepared by: Lamar Ivory  Exercises - Seated Knee Flexion AAROM  - 10 x daily - 7 x weekly - 1 sets - 10 reps - 5 seconds hold - Supine Heel Slide with Strap  - 10 x daily - 7 x weekly - 1 sets - 10 reps - 5 seconds hold - Supine Quadricep Sets  - 10 x daily - 7 x weekly - 2 sets - 10 reps - 5 second hold - Seated Knee Extension AROM  - 10 x daily - 7 x weekly - 1 sets - 10 reps - 2 seconds hold  ASSESSMENT:  CLINICAL IMPRESSION:  Patient arrived to session noting increased soreness/stiffness this morning and required extra time on UBE for mobility. Patient tolerated activities this date though continues to have deficits in AROM and gait mechanics. Patient will continue to benefit from skilled PT.  OBJECTIVE IMPAIRMENTS: Abnormal gait, decreased activity tolerance, decreased endurance, decreased mobility, difficulty walking, decreased ROM, decreased strength, increased edema, impaired perceived functional ability, and pain.   ACTIVITY LIMITATIONS: bending, sitting, standing, squatting, sleeping, stairs, dressing, and locomotion level  PARTICIPATION LIMITATIONS: cleaning, driving, and community activity  PERSONAL FACTORS: OA, asthma, Bipolar, DDD  L4-S1, headache, HTN, Lt leg neuropathy, obesity, pre-diabetes, plantar fascia surgery, bilateral knee surgeries, spinal fusion, bilateral carpal tunnel are also affecting patient's functional outcome.   REHAB POTENTIAL: Good  CLINICAL DECISION MAKING: Evolving/moderate complexity  EVALUATION COMPLEXITY: Moderate   GOALS: Goals reviewed with patient? Yes  SHORT TERM GOALS: Target date: 07/14/2024 Annalaura will be independent with her day 1 HEP Baseline: Started 06/16/2024 Goal status: On Going 06/17/2024  2.  Improve right knee AROM to 0 - 5 - 95 degrees Baseline: 0 - 13 - 70 degrees Goal status: Partially Met 06/17/2024  3.  Improve right quadriceps strength as assessed by functional self-report Baseline: 2+/5 Goal status: INITIAL   LONG TERM GOALS: Target date: 08/11/2024  Improve Patient Specific Functional Score to 5 Baseline: 0.6 Goal status: INITIAL  2.  Linnea will report right knee pain consistently 0-3/10 on the Visual Analog Scale Baseline: 7-10/10 Goal status: INITIAL  3.  Improve right knee AROM to 0 - 2 - 110 degrees Baseline: 0 - 13 - 70 degrees Goal status: INITIAL  4.  Improve right quadriceps strength to 4/5 MMT Baseline: 2+/5 MMT Goal status: INITIAL  5.  Delainey will be able to ambulate in the community without a noticeable limp due to her range deficit Baseline: Limited by a 13 degree extension deficit Goal status: INITIAL  6.  Elexa will be independent with her long-term maintenance HEP at [place] Baseline: Started 06/16/2024 Goal status: INITIAL  PLAN:  PT FREQUENCY: 5 x a week for 2 weeks, then 3 x a week for 6 weeks  PT DURATION: 8 weeks  PLANNED INTERVENTIONS: 97750- Physical Performance Testing, 97110-Therapeutic exercises, 97530- Therapeutic activity, [id number]- Neuromuscular re-education, 97535- Self Care, 02859- Manual therapy, [phone]- Gait training, [phone]- Electrical stimulation (unattended), 97016- Vasopneumatic device, 20560 (1-2 muscles), 20561  (3+ muscles)- Dry Needling, Patient/Family education, Balance training, Stair training, Joint mobilization, and Cryotherapy  PLAN FOR NEXT SESSION:  Post-manipulation so heavy passive, active-assisted and active range of motion emphasis, gait with SPC    Susannah Daring, PT, DPT 06/21/2024 8:15 AM

## 2024-06-22 ENCOUNTER — Ambulatory Visit: Admitting: Physical Therapy

## 2024-06-22 ENCOUNTER — Encounter: Payer: Self-pay | Admitting: Physical Therapy

## 2024-06-22 DIAGNOSIS — M1711 Unilateral primary osteoarthritis, right knee: Secondary | ICD-10-CM | POA: Diagnosis not present

## 2024-06-22 DIAGNOSIS — M25661 Stiffness of right knee, not elsewhere classified: Secondary | ICD-10-CM | POA: Diagnosis not present

## 2024-06-22 DIAGNOSIS — R262 Difficulty in walking, not elsewhere classified: Secondary | ICD-10-CM

## 2024-06-22 DIAGNOSIS — G8929 Other chronic pain: Secondary | ICD-10-CM

## 2024-06-22 DIAGNOSIS — M47816 Spondylosis without myelopathy or radiculopathy, lumbar region: Secondary | ICD-10-CM | POA: Diagnosis not present

## 2024-06-22 DIAGNOSIS — M6281 Muscle weakness (generalized): Secondary | ICD-10-CM | POA: Diagnosis not present

## 2024-06-22 DIAGNOSIS — G894 Chronic pain syndrome: Secondary | ICD-10-CM | POA: Diagnosis not present

## 2024-06-22 DIAGNOSIS — M25561 Pain in right knee: Secondary | ICD-10-CM | POA: Diagnosis not present

## 2024-06-22 DIAGNOSIS — R600 Localized edema: Secondary | ICD-10-CM

## 2024-06-22 DIAGNOSIS — M62830 Muscle spasm of back: Secondary | ICD-10-CM | POA: Diagnosis not present

## 2024-06-22 NOTE — Therapy (Signed)
 OUTPATIENT PHYSICAL THERAPY LOWER EXTREMITY TREATMENT   Patient Name: [name] MRN: [id number] DOB:Aug 05, 1965, 58 y.o., female Today's Date: 06/23/2024  END OF SESSION:  PT End of Session - 06/23/24 1516     Visit Number 6    Date for Recertification  08/11/24    Authorization Type [employer] MEDICARE    PT Start Time 1300    PT Stop Time 1348    PT Time Calculation (min) 48 min    Activity Tolerance Patient tolerated treatment well    Behavior During Therapy [facility] for tasks assessed/performed           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   Obesity 07/14/2012   Pneumonia    Polycystic disease, ovaries    Pre-diabetes    Reflux    Sinus tachycardia 07/14/2012   Pt says HR > 100 is consistent   Past Surgical History:  Procedure Laterality Date   COLONOSCOPY     FOOT SURGERY Left    Right plantar facsiitis scrape   KNEE CLOSED REDUCTION Right 06/16/2024   Procedure: MANIPULATION, KNEE, CLOSED;  Surgeon: Vernetta Lonni GRADE, MD;  Location: [facility];  Service: Orthopedics;  Laterality: Right;   KNEE SURGERY Bilateral 2007   Tendon release   KNEE SURGERY Right 04/05/2024   Total R Knee Replacement   LAPAROSCOPIC GASTRIC SLEEVE RESECTION N/A 03/30/2017   Procedure: LAPAROSCOPIC GASTRIC SLEEVE RESECTION, UPPER ENDO;  Surgeon: Tanda Locus, MD;  Location: [facility];  Service: General;  Laterality: N/A;   NASAL SINUS SURGERY  2004   OVARIAN CYST REMOVAL  1998   A cyst on the fallopian tube removed   SPINAL FUSION      TONSILLECTOMY  1996   TOTAL KNEE ARTHROPLASTY Right 04/05/2024   Procedure: ARTHROPLASTY, KNEE, TOTAL;  Surgeon: Vernetta Lonni GRADE, MD;  Location: [facility];  Service: Orthopedics;  Laterality: Right;   Patient Active Problem List   Diagnosis Date Noted   Arthrofibrosis of knee joint, right 06/15/2024   Status post total right knee replacement 04/05/2024   Actinic keratosis 01/18/2024   Absolute anemia 01/18/2024   Pruritus 01/18/2024   Bilateral carpal tunnel syndrome 11/25/2023   History of sleeve gastrectomy 11/25/2023   Generalized anxiety disorder with panic attacks 08/12/2023   Primary insomnia 08/12/2023   Chronic constipation 08/12/2023   Contusion of nose 04/23/2023   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   Chronic migraine without aura, with intractable migraine, so stated, with status migrainosus 01/25/2023   History of psychiatric treatment 01/13/2023   Viral upper respiratory tract infection 03/28/2021   Episodic tension-type headache, not intractable 11/07/2020   Hypertrophy of inferior nasal turbinate 11/07/2020   Postnasal drip 11/07/2020   Lymphadenopathy, anterior cervical 07/13/2020   Sebaceous cyst 07/13/2020   Iron  deficiency anemia secondary to inadequate dietary iron  intake 01/12/2020   Constipation 12/13/2019   Hyperlipidemia LDL goal <130 09/08/2019  Positive colorectal cancer screening using Cologuard test 03/02/2018   Leukocytosis 01/26/2018   Onychomycosis of great toe 12/22/2017   Family history of DVT 03/20/2017   Unilateral primary osteoarthritis, right knee 05/13/2016   Dyslipidemia 03/04/2016   Right knee pain 03/03/2016   Pre-diabetes 11/01/2015   Allergic rhinitis 05/28/2015   Sleep state misperception 07/05/2014   BMI 33.0-33.9,adult 05/24/2013   Polycystic ovary disease 07/14/2012   Obstructive sleep apnea (adult) (pediatric) 02/04/2012   Perimenopausal 11/10/2011   Hot flashes 11/10/2011   Disturbance in sleep behavior 09/16/2011   Asthma  05/23/2011   Backache 05/23/2011   Bipolar disorder in partial remission 05/23/2011   Dysphagia 05/23/2011   Esophageal reflux 05/23/2011   Rhinitis, allergic 05/23/2011    PCP: Camie Moats, PA-C  REFERRING PROVIDER: Lonni CINDERELLA Poli, MD  REFERRING DIAG:  [phone] (ICD-10-CM) - Status post total right knee replacement  Post-manipulation this morning  THERAPY DIAG:  Difficulty in walking, not elsewhere classified  Muscle weakness (generalized)  Localized edema  Chronic pain of right knee  Stiffness of right knee, not elsewhere classified  Rationale for Evaluation and Treatment: Rehabilitation  ONSET DATE: Manipulation today 06/16/2024, TKA 04/05/2024  SUBJECTIVE:   SUBJECTIVE STATEMENT: She reports sleeping better.  PERTINENT HISTORY: OA, asthma, Bipolar, DDD L4-S1, headache, HTN, Lt leg neuropathy, obesity, pre-diabetes, plantar fascia surgery, bilateral knee surgeries, spinal fusion, bilateral carpal tunnel  [name] had her right knee replaced 04/05/2024.  She only had home health PT due to difficulty getting to outpatient PT.  Her manipulation was this morning and she is motivated to fix her right knee.  PAIN:  Are you having pain?  Yes: NPRS scale: 9/10 today Pain location: Rt knee Pain description: Stiff, achy Aggravating factors: Bending, walking, in and out of the car Relieving factors: Ice, ibuprofen, diludid, fednyl  PRECAUTIONS: Back  RED FLAGS: None   WEIGHT BEARING RESTRICTIONS: No  FALLS:  Has patient fallen in last 6 months? No  LIVING ENVIRONMENT: Lives with: lives with their family, lives with their spouse, and mon Lives in: House/apartment Stairs: Difficulty with stairs Has following equipment at home: None  OCCUPATION: Disbailty  PLOF: Needs assistance with ADLs  PATIENT GOALS: Bend the knee normally  NEXT MD VISIT: 06/29/2024  OBJECTIVE:  Note: Objective measures were completed at Evaluation unless otherwise  noted.  DIAGNOSTIC FINDINGS: Right knee arthroplasty without immediate postoperative complication.   PATIENT SURVEYS:  PSFS: THE PATIENT SPECIFIC FUNCTIONAL SCALE  Place score of 0-10 (0 = unable to perform activity and 10 = able to perform activity at the same level as before injury or problem)  Activity Date: 06/16/2024    Walk; get in/out of a vehicle; clothes out of the dryer 1 x 3    2.  Sleep; Walk on uneven ground 0 x 2    3.     4.      Total Score 0.06      Total Score = Sum of activity scores/number of activities  Minimally Detectable Change: 3 points (for single activity); 2 points (for average score)  [employer] [facility] (nd). The Patient Specific Functional Scale . Retrieved from [email or web address]   COGNITION: Overall cognitive status: Within functional limits for tasks assessed    SENSATION: No new tingling or paresthesias  EDEMA:  Noted and not objectively assessed  LOWER EXTREMITY ROM:   AROM Left/Right  06/16/24 Right  06/17/24 Right  06/20/24 Right 06/23/24  Hip flexion      Hip extension  Hip abduction      Hip adduction      Hip internal rotation      Hip external rotation      Knee flexion 131/70 (Right 95 post exercises) 98 with PT assist Supine  80A 95 P Supine  84 A  Knee extension 0/-13 (Right -7 post-exercises) -7 active    Ankle dorsiflexion      Ankle plantarflexion      Ankle inversion      Ankle eversion       (Blank rows = not tested)  LOWER EXTREMITY STRENGTH:  MMT Left/Right 06/16/2024   Hip flexion    Hip extension    Hip abduction    Hip adduction    Hip internal rotation    Hip external rotation    Knee flexion    Knee extension 4/2+   Ankle dorsiflexion    Ankle plantarflexion    Ankle inversion    Ankle eversion     (Blank rows = not tested)  GAIT: Distance walked: 50 feet Assistive device utilized: None Level of assistance: Complete  Independence Comments: Tyronica has limited WB endurance and walks with a very stiff gait, unable to fully extend her right knee    TREATMENT DATE: R TKA 06/23/24 TherEx:  Sci Fit bike seat 12 with bilat LE only level 1 for 8 min with VC for R ankle DF Standing flexion stretch on 6 step 10x10sec Knee flexion slides 20x with slide board then with PT assist and di Supine RLE heel slide with ball & strap 5 sec hold flexion and 5 sec hold ext / leg press 10 reps TherAct:  Bilat leg press 3x10 with 75# Step up/down 6 step 10x Manual for knee flexion Vaso:  Rt LE elevated on wedge for 10 minutes with medium compression at 34deg    06/22/2024 TherEx:  Sci Fit bike seat 12 with bilat LE only level 1 for 5 minutes 2 sets (PT providing soft tissue scar mob bw sets)  More fluent rotation / flexion movement after soft tissue work Verbal cue given for increasing ankle DF  Standing flexion stretch with rt foot on chair against wall (BUE support on chair backs) 10 sec hold 3 reps 4 sets more LLE stance 1-2 closer to chair each set to increase flexion stretch. Calf stretch on 6 step 3x30s  Hamstring stretch on 6 step 3x30s Supine RLE heel slide with ball & strap 5 sec hold flexion and 5 sec hold ext / leg press 10 reps Supine using towel to hold femur vertical for gravity assist knee flexion 5 sec hold 5 reps PT recommended performing at least one exercise every 30-60 minutes when awake to limit knee stiffening. Pt verbalized understanding.   TherAct:  Upon arising weight shift over RLE 5 reps in bilateral stance.  Pt able to initiate gait with less antalgic pattern.  Bilat leg press 15 reps with 81# 1 set Step up & step down RLE 4 step with BUE support 10 reps with PT demo & verbal cues on technique.   Manual:  Seated knee flexion with IR/distraction 1x10 with 10s hold and PT providing PROM knee extension with distraction for 10s between each set with contract-relax Instrument assisted Soft  tissue & scar mobs and suction cupping  Self-care: PT verbal & demo cues on positioning pillow bw LEs in sidelying and pillows to Tent sheets off feet.  Pt verbalized understanding.   PT demo & verbal cues on ice massage. Pt  verbalized understanding.    Vaso:  Rt LE elevated on wedge with towel ext stretch for 10 minutes with high compression at 34deg                                                                                                                      TREATMENT DATE: R TKA 06/21/2024 TherEx:  UBE with bilat LE only level 1 for 10 minutes  Verbal cue given for increasing ankle DF  Calf stretch on 6 step 3x30s  Hamstring stretch on 6 step 3x30s  TherAct:  Bilat leg press 2x15 with 75#  Manual:  Seated knee flexion with IR/distraction 1x10 with 10s hold and PT providing PROM knee extension with distraction for 10s between each set   Vaso:  Rt LE elevated on wedge for 10 minutes with medium compression at 34deg   06/20/24 Sci fit seat 12 no resistance full rotations backwards first then forwards  Leg press #75 3x10 with slow extension and then push onto heels Seated knee flexion with slide board 20x Quad sets with towel roll under ankle 2x10 Vaso Right Knee Medium 34* 10 minutes   06/17/2024 Recumbent bike Seat 8 for 5 minutes active-assisted range of motion Quad sets with right heel prop 10 x 5 seconds Supine knee flexion with strap & PT assist 10 x 10 seconds Supine knee extension (quad sets, no heel prop) with PT assist 10 x 5 seconds, between reps of knee flexion with strap Seated tailgate knee flexion 2 x 1 minute Knee flexion AAROM (left pushes right into flexion) 12 x 10 seconds Seated TKE (between sets of AAROM) 12 x 2 seconds  Functional Activities: Double Leg Press 10 reps at 75# and 10 reps at 100# stretch into flexion and extension, slow eccentrics Single Leg Press 10 reps at 25# stretch into flexion and extension, slow eccentrics  Vaso  Right Knee Medium 34* 10 minutes   PATIENT EDUCATION:  Education details: See above Person educated: Patient Education method: Explanation, Demonstration, Tactile cues, Verbal cues, and Handouts Education comprehension: verbal cues required, tactile cues required, and needs further education  HOME EXERCISE PROGRAM: Access Code: [email or web address] URL: [email or web address] Date: 06/16/2024 Prepared by: Lamar Ivory  Exercises - Seated Knee Flexion AAROM  - 10 x daily - 7 x weekly - 1 sets - 10 reps - 5 seconds hold - Supine Heel Slide with Strap  - 10 x daily - 7 x weekly - 1 sets - 10 reps - 5 seconds hold - Supine Quadricep Sets  - 10 x daily - 7 x weekly - 2 sets - 10 reps - 5 second hold - Seated Knee Extension AROM  - 10 x daily - 7 x weekly - 1 sets - 10 reps - 2 seconds hold  ASSESSMENT:  CLINICAL IMPRESSION:  Patient presents with increased hip hike when elevating leg onto step. VC help with patient being aware.  OBJECTIVE IMPAIRMENTS: Abnormal gait, decreased activity tolerance, decreased endurance, decreased mobility, difficulty walking, decreased  ROM, decreased strength, increased edema, impaired perceived functional ability, and pain.   ACTIVITY LIMITATIONS: bending, sitting, standing, squatting, sleeping, stairs, dressing, and locomotion level  PARTICIPATION LIMITATIONS: cleaning, driving, and community activity  PERSONAL FACTORS: OA, asthma, Bipolar, DDD L4-S1, headache, HTN, Lt leg neuropathy, obesity, pre-diabetes, plantar fascia surgery, bilateral knee surgeries, spinal fusion, bilateral carpal tunnel are also affecting patient's functional outcome.   REHAB POTENTIAL: Good  CLINICAL DECISION MAKING: Evolving/moderate complexity  EVALUATION COMPLEXITY: Moderate   GOALS: Goals reviewed with patient? Yes  SHORT TERM GOALS: Target date: 07/14/2024 Virgil will be independent with her day 1 HEP Baseline: Started 06/16/2024 Goal status: Ongoing   06/22/2024  2.  Improve right knee AROM to 0 - 5 - 95 degrees Baseline: 0 - 13 - 70 degrees Goal status:   Ongoing  06/22/2024  3.  Improve right quadriceps strength as assessed by functional self-report Baseline: 2+/5 Goal status: Ongoing  06/22/2024   LONG TERM GOALS: Target date: 08/11/2024  Improve Patient Specific Functional Score to 5 Baseline: 0.6 Goal status: Ongoing  06/22/2024  2.  Chapel will report right knee pain consistently 0-3/10 on the Visual Analog Scale Baseline: 7-10/10 Goal status: Ongoing  06/22/2024  3.  Improve right knee AROM to 0 - 2 - 110 degrees Baseline: 0 - 13 - 70 degrees Goal status: Ongoing  06/22/2024  4.  Improve right quadriceps strength to 4/5 MMT Baseline: 2+/5 MMT Goal status: Ongoing  06/22/2024  5.  Ryna will be able to ambulate in the community without a noticeable limp due to her range deficit Baseline: Limited by a 13 degree extension deficit Goal status: Ongoing  06/22/2024  6.  Djeneba will be independent with her long-term maintenance HEP at DC Baseline: Started 06/16/2024 Goal status: Ongoing  06/22/2024   PLAN:  PT FREQUENCY: 5 x a week for 2 weeks, then 3 x a week for 6 weeks  PT DURATION: 8 weeks  PLANNED INTERVENTIONS: 97750- Physical Performance Testing, 97110-Therapeutic exercises, 97530- Therapeutic activity, 97112- Neuromuscular re-education, 97535- Self Care, 02859- Manual therapy, [phone]- Gait training, [phone]- Electrical stimulation (unattended), 97016- Vasopneumatic device, 20560 (1-2 muscles), 20561 (3+ muscles)- Dry Needling, Patient/Family education, Balance training, Stair training, Joint mobilization, and Cryotherapy  PLAN FOR NEXT SESSION:  continue with manual therapy & exercise for Post-manipulation so heavy passive, active-assisted and active range of motion emphasis    Burnard CHRISTELLA Meth, PT, DPT 06/23/2024, 3:18 PM

## 2024-06-22 NOTE — Therapy (Signed)
 OUTPATIENT PHYSICAL THERAPY LOWER EXTREMITY TREATMENT   Patient Name: [name] MRN: [id number] DOB:1965-11-24, 58 y.o., female Today's Date: 06/22/2024  END OF SESSION:  PT End of Session - 06/22/24 1507     Visit Number 5    Number of Visits 28    Date for Recertification  08/11/24    Authorization Type [employer] MEDICARE    PT Start Time 1510    PT Stop Time 1620    PT Time Calculation (min) 70 min    Activity Tolerance Patient tolerated treatment well    Behavior During Therapy [facility] for tasks assessed/performed              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   Obesity 07/14/2012   Pneumonia    Polycystic disease, ovaries    Pre-diabetes    Reflux    Sinus tachycardia 07/14/2012   Pt says HR > 100 is consistent   Past Surgical History:  Procedure Laterality Date   COLONOSCOPY     FOOT SURGERY Left    Right plantar facsiitis scrape   KNEE CLOSED REDUCTION Right 06/16/2024   Procedure: MANIPULATION, KNEE, CLOSED;  Surgeon: Vernetta Lonni GRADE, MD;  Location: [facility];  Service: Orthopedics;  Laterality: Right;   KNEE SURGERY Bilateral 2007   Tendon release   KNEE SURGERY Right 04/05/2024   Total R Knee Replacement   LAPAROSCOPIC GASTRIC SLEEVE RESECTION N/A 03/30/2017   Procedure: LAPAROSCOPIC GASTRIC SLEEVE RESECTION, UPPER ENDO;  Surgeon: Tanda Locus, MD;  Location: [facility];  Service: General;  Laterality: N/A;   NASAL SINUS SURGERY  2004   OVARIAN CYST REMOVAL  1998   A cyst on the fallopian tube removed    SPINAL FUSION     TONSILLECTOMY  1996   TOTAL KNEE ARTHROPLASTY Right 04/05/2024   Procedure: ARTHROPLASTY, KNEE, TOTAL;  Surgeon: Vernetta Lonni GRADE, MD;  Location: [facility];  Service: Orthopedics;  Laterality: Right;   Patient Active Problem List   Diagnosis Date Noted   Arthrofibrosis of knee joint, right 06/15/2024   Status post total right knee replacement 04/05/2024   Actinic keratosis 01/18/2024   Absolute anemia 01/18/2024   Pruritus 01/18/2024   Bilateral carpal tunnel syndrome 11/25/2023   History of sleeve gastrectomy 11/25/2023   Generalized anxiety disorder with panic attacks 08/12/2023   Primary insomnia 08/12/2023   Chronic constipation 08/12/2023   Contusion of nose 04/23/2023   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   Chronic migraine without aura, with intractable migraine, so stated, with status migrainosus 01/25/2023   History of psychiatric treatment 01/13/2023   Viral upper respiratory tract infection 03/28/2021   Episodic tension-type headache, not intractable 11/07/2020   Hypertrophy of inferior nasal turbinate 11/07/2020   Postnasal drip 11/07/2020   Lymphadenopathy, anterior cervical 07/13/2020   Sebaceous cyst 07/13/2020   Iron  deficiency anemia secondary to inadequate dietary iron  intake 01/12/2020   Constipation  12/13/2019   Hyperlipidemia LDL goal <130 09/08/2019   Positive colorectal cancer screening using Cologuard test 03/02/2018   Leukocytosis 01/26/2018   Onychomycosis of great toe 12/22/2017   Family history of DVT 03/20/2017   Unilateral primary osteoarthritis, right knee 05/13/2016   Dyslipidemia 03/04/2016   Right knee pain 03/03/2016   Pre-diabetes 11/01/2015   Allergic rhinitis 05/28/2015   Sleep state misperception 07/05/2014   BMI 33.0-33.9,adult 05/24/2013   Polycystic ovary disease 07/14/2012   Obstructive sleep apnea (adult) (pediatric) 02/04/2012   Perimenopausal 11/10/2011   Hot flashes 11/10/2011   Disturbance in sleep behavior  09/16/2011   Asthma 05/23/2011   Backache 05/23/2011   Bipolar disorder in partial remission 05/23/2011   Dysphagia 05/23/2011   Esophageal reflux 05/23/2011   Rhinitis, allergic 05/23/2011    PCP: Camie Moats, PA-C  REFERRING PROVIDER: Lonni CINDERELLA Poli, MD  REFERRING DIAG:  [phone] (ICD-10-CM) - Status post total right knee replacement  Post-manipulation this morning  THERAPY DIAG:  Difficulty in walking, not elsewhere classified  Muscle weakness (generalized)  Localized edema  Chronic pain of right knee  Stiffness of right knee, not elsewhere classified  Rationale for Evaluation and Treatment: Rehabilitation  ONSET DATE: Manipulation today 06/16/2024, TKA 04/05/2024  SUBJECTIVE:   SUBJECTIVE STATEMENT: She is elevating with her bed that raises her feet but knee continues to swell.  She has been doing her exercises.    PERTINENT HISTORY: OA, asthma, Bipolar, DDD L4-S1, headache, HTN, Lt leg neuropathy, obesity, pre-diabetes, plantar fascia surgery, bilateral knee surgeries, spinal fusion, bilateral carpal tunnel  [name] had her right knee replaced 04/05/2024.  She only had home health PT due to difficulty getting to outpatient PT.  Her manipulation was this morning and she is motivated to fix her right knee.  PAIN:  Are you having pain?  Yes: NPRS scale: since last PT lowest 8/10 highest 10/10 Pain location: Rt knee Pain description: Stiff, achy Aggravating factors: Bending, walking, in and out of the car Relieving factors: Ice, ibuprofen, diludid, fednyl  PRECAUTIONS: Back  RED FLAGS: None   WEIGHT BEARING RESTRICTIONS: No  FALLS:  Has patient fallen in last 6 months? No  LIVING ENVIRONMENT: Lives with: lives with their family, lives with their spouse, and mon Lives in: House/apartment Stairs: Difficulty with stairs Has following equipment at home: None  OCCUPATION: Disbailty  PLOF: Needs assistance with ADLs  PATIENT GOALS: Bend the knee  normally  NEXT MD VISIT: 06/29/2024  OBJECTIVE:  Note: Objective measures were completed at Evaluation unless otherwise noted.  DIAGNOSTIC FINDINGS: Right knee arthroplasty without immediate postoperative complication.   PATIENT SURVEYS:  PSFS: THE PATIENT SPECIFIC FUNCTIONAL SCALE  Place score of 0-10 (0 = unable to perform activity and 10 = able to perform activity at the same level as before injury or problem)  Activity Date: 06/16/2024    Walk; get in/out of a vehicle; clothes out of the dryer 1 x 3    2.  Sleep; Walk on uneven ground 0 x 2    3.     4.      Total Score 0.06      Total Score = Sum of activity scores/number of activities  Minimally Detectable Change: 3 points (for single activity); 2 points (for average score)  [employer] (nd). The Patient Specific Functional Scale . Retrieved from [email or web address]   COGNITION: Overall cognitive status: Within functional limits for tasks assessed    SENSATION: No new tingling or paresthesias  EDEMA:  Noted and not objectively assessed  LOWER EXTREMITY ROM:   AROM Left/Right  06/16/24 Right  06/17/24 Right  06/20/24  Hip flexion     Hip extension     Hip abduction     Hip adduction     Hip internal rotation     Hip external rotation     Knee flexion 131/70 (Right 95 post exercises) 98 with PT assist Supine  80A 95 P  Knee extension 0/-13 (Right -7 post-exercises) -7 active   Ankle dorsiflexion     Ankle plantarflexion     Ankle inversion     Ankle eversion      (Blank rows = not tested)  LOWER EXTREMITY STRENGTH:  MMT Left/Right 06/16/2024   Hip flexion    Hip extension    Hip abduction    Hip adduction    Hip internal rotation    Hip external rotation    Knee flexion    Knee extension 4/2+   Ankle dorsiflexion    Ankle plantarflexion    Ankle inversion    Ankle eversion     (Blank rows = not tested)  GAIT: Distance  walked: 50 feet Assistive device utilized: None Level of assistance: Complete Independence Comments: Omeka has limited WB endurance and walks with a very stiff gait, unable to fully extend her right knee    TREATMENT DATE: R TKA 06/22/2024 TherEx:  Sci Fit bike seat 12 with bilat LE only level 1 for 5 minutes 2 sets (PT providing soft tissue scar mob bw sets)  More fluent rotation / flexion movement after soft tissue work Verbal cue given for increasing ankle DF  Standing flexion stretch with rt foot on chair against wall (BUE support on chair backs) 10 sec hold 3 reps 4 sets more LLE stance 1-2 closer to chair each set to increase flexion stretch. Calf stretch on 6 step 3x30s  Hamstring stretch on 6 step 3x30s Supine RLE heel slide with ball & strap 5 sec hold flexion and 5 sec hold ext / leg press 10 reps Supine using towel to hold femur vertical for gravity assist knee flexion 5 sec hold 5 reps PT recommended performing at least one exercise every 30-60 minutes when awake to limit knee stiffening. Pt verbalized understanding.   TherAct:  Upon arising weight shift over RLE 5 reps in bilateral stance.  Pt able to initiate gait with less antalgic pattern.  Bilat leg press 15 reps with 81# 1 set Step up & step down RLE 4 step with BUE support 10 reps with PT demo & verbal cues on technique.   Manual:  Seated knee flexion with IR/distraction 1x10 with 10s hold and PT providing PROM knee extension with distraction for 10s between each set with contract-relax Instrument assisted Soft tissue & scar mobs and suction cupping  Self-care: PT verbal & demo cues on positioning pillow bw LEs in sidelying and pillows to Tent sheets off feet.  Pt verbalized understanding.   PT demo & verbal cues on ice massage. Pt verbalized understanding.    Vaso:  Rt LE elevated on wedge with towel ext stretch for 10 minutes with high compression at 34deg  TREATMENT DATE: R TKA 06/21/2024 TherEx:  UBE with bilat LE only level 1 for 10 minutes  Verbal cue given for increasing ankle DF  Calf stretch on 6 step 3x30s  Hamstring stretch on 6 step 3x30s  TherAct:  Bilat leg press 2x15 with 75#  Manual:  Seated knee flexion with IR/distraction 1x10 with 10s hold and PT providing PROM knee extension with distraction for 10s between each set   Vaso:  Rt LE elevated on wedge for 10 minutes with medium compression at 34deg   06/20/24 Sci fit seat 12 no resistance full rotations backwards first then forwards  Leg press #75 3x10 with slow extension and then push onto heels Seated knee flexion with slide board 20x Quad sets with towel roll under ankle 2x10 Vaso Right Knee Medium 34* 10 minutes   06/17/2024 Recumbent bike Seat 8 for 5 minutes active-assisted range of motion Quad sets with right heel prop 10 x 5 seconds Supine knee flexion with strap & PT assist 10 x 10 seconds Supine knee extension (quad sets, no heel prop) with PT assist 10 x 5 seconds, between reps of knee flexion with strap Seated tailgate knee flexion 2 x 1 minute Knee flexion AAROM (left pushes right into flexion) 12 x 10 seconds Seated TKE (between sets of AAROM) 12 x 2 seconds  Functional Activities: Double Leg Press 10 reps at 75# and 10 reps at 100# stretch into flexion and extension, slow eccentrics Single Leg Press 10 reps at 25# stretch into flexion and extension, slow eccentrics  Vaso Right Knee Medium 34* 10 minutes   PATIENT EDUCATION:  Education details: See above Person educated: Patient Education method: Explanation, Demonstration, Tactile cues, Verbal cues, and Handouts Education comprehension: verbal cues required, tactile cues required, and needs further education  HOME EXERCISE PROGRAM: Access Code: [email or web address] URL: [email or web address] Date:  06/16/2024 Prepared by: Lamar Ivory  Exercises - Seated Knee Flexion AAROM  - 10 x daily - 7 x weekly - 1 sets - 10 reps - 5 seconds hold - Supine Heel Slide with Strap  - 10 x daily - 7 x weekly - 1 sets - 10 reps - 5 seconds hold - Supine Quadricep Sets  - 10 x daily - 7 x weekly - 2 sets - 10 reps - 5 second hold - Seated Knee Extension AROM  - 10 x daily - 7 x weekly - 1 sets - 10 reps - 2 seconds hold  ASSESSMENT:  CLINICAL IMPRESSION:  Patient appears to understand PT recommendations for weight shift upon arising, positioning in sidelying, ice massage and exercising during awake hours.  Pt had improved knee motion following soft tissue work.  Patient will continue to benefit from skilled PT.  OBJECTIVE IMPAIRMENTS: Abnormal gait, decreased activity tolerance, decreased endurance, decreased mobility, difficulty walking, decreased ROM, decreased strength, increased edema, impaired perceived functional ability, and pain.   ACTIVITY LIMITATIONS: bending, sitting, standing, squatting, sleeping, stairs, dressing, and locomotion level  PARTICIPATION LIMITATIONS: cleaning, driving, and community activity  PERSONAL FACTORS: OA, asthma, Bipolar, DDD L4-S1, headache, HTN, Lt leg neuropathy, obesity, pre-diabetes, plantar fascia surgery, bilateral knee surgeries, spinal fusion, bilateral carpal tunnel are also affecting patient's functional outcome.   REHAB POTENTIAL: Good  CLINICAL DECISION MAKING: Evolving/moderate complexity  EVALUATION COMPLEXITY: Moderate   GOALS: Goals reviewed with patient? Yes  SHORT TERM GOALS: Target date: 07/14/2024 Kaidan will be independent with her day 1 HEP Baseline: Started 06/16/2024 Goal status: Ongoing  06/22/2024  2.  Improve right knee  AROM to 0 - 5 - 95 degrees Baseline: 0 - 13 - 70 degrees Goal status:   Ongoing  06/22/2024  3.  Improve right quadriceps strength as assessed by functional self-report Baseline: 2+/5 Goal status: Ongoing   06/22/2024   LONG TERM GOALS: Target date: 08/11/2024  Improve Patient Specific Functional Score to 5 Baseline: 0.6 Goal status: Ongoing  06/22/2024  2.  Keshonda will report right knee pain consistently 0-3/10 on the Visual Analog Scale Baseline: 7-10/10 Goal status: Ongoing  06/22/2024  3.  Improve right knee AROM to 0 - 2 - 110 degrees Baseline: 0 - 13 - 70 degrees Goal status: Ongoing  06/22/2024  4.  Improve right quadriceps strength to 4/5 MMT Baseline: 2+/5 MMT Goal status: Ongoing  06/22/2024  5.  Melisssa will be able to ambulate in the community without a noticeable limp due to her range deficit Baseline: Limited by a 13 degree extension deficit Goal status: Ongoing  06/22/2024  6.  Cheyne will be independent with her long-term maintenance HEP at DC Baseline: Started 06/16/2024 Goal status: Ongoing  06/22/2024   PLAN:  PT FREQUENCY: 5 x a week for 2 weeks, then 3 x a week for 6 weeks  PT DURATION: 8 weeks  PLANNED INTERVENTIONS: 97750- Physical Performance Testing, 97110-Therapeutic exercises, 97530- Therapeutic activity, 97112- Neuromuscular re-education, 97535- Self Care, 02859- Manual therapy, [phone]- Gait training, [phone]- Electrical stimulation (unattended), 97016- Vasopneumatic device, 20560 (1-2 muscles), 20561 (3+ muscles)- Dry Needling, Patient/Family education, Balance training, Stair training, Joint mobilization, and Cryotherapy  PLAN FOR NEXT SESSION:  continue with manual therapy & exercise for Post-manipulation so heavy passive, active-assisted and active range of motion emphasis    Grayce Spatz, PT, DPT 06/22/2024, 4:26 PM

## 2024-06-23 ENCOUNTER — Ambulatory Visit: Payer: Self-pay

## 2024-06-23 DIAGNOSIS — R262 Difficulty in walking, not elsewhere classified: Secondary | ICD-10-CM | POA: Diagnosis not present

## 2024-06-23 DIAGNOSIS — R600 Localized edema: Secondary | ICD-10-CM

## 2024-06-23 DIAGNOSIS — M6281 Muscle weakness (generalized): Secondary | ICD-10-CM

## 2024-06-23 DIAGNOSIS — M25661 Stiffness of right knee, not elsewhere classified: Secondary | ICD-10-CM | POA: Diagnosis not present

## 2024-06-23 DIAGNOSIS — G8929 Other chronic pain: Secondary | ICD-10-CM

## 2024-06-23 DIAGNOSIS — M25561 Pain in right knee: Secondary | ICD-10-CM | POA: Diagnosis not present

## 2024-06-24 ENCOUNTER — Ambulatory Visit

## 2024-06-24 DIAGNOSIS — R262 Difficulty in walking, not elsewhere classified: Secondary | ICD-10-CM | POA: Diagnosis not present

## 2024-06-24 DIAGNOSIS — M6281 Muscle weakness (generalized): Secondary | ICD-10-CM

## 2024-06-24 DIAGNOSIS — R600 Localized edema: Secondary | ICD-10-CM | POA: Diagnosis not present

## 2024-06-24 DIAGNOSIS — M25661 Stiffness of right knee, not elsewhere classified: Secondary | ICD-10-CM

## 2024-06-24 DIAGNOSIS — M25561 Pain in right knee: Secondary | ICD-10-CM | POA: Diagnosis not present

## 2024-06-24 DIAGNOSIS — G8929 Other chronic pain: Secondary | ICD-10-CM

## 2024-06-24 NOTE — Therapy (Signed)
 " OUTPATIENT PHYSICAL THERAPY LOWER EXTREMITY TREATMENT   Patient Name: [name] MRN: [id number] DOB:July 24, 1965, 58 y.o., female Today's Date: 06/24/2024  END OF SESSION:  PT End of Session - 06/24/24 1256     Visit Number 7    Number of Visits 28    Date for Recertification  08/11/24    Authorization Type [employer] MEDICARE    Progress Note Due on Visit 10    PT Start Time 1301    PT Stop Time 1355    PT Time Calculation (min) 54 min    Activity Tolerance Patient tolerated treatment well    Behavior During Therapy WFL for tasks assessed/performed           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   Obesity 07/14/2012   Pneumonia    Polycystic disease, ovaries    Pre-diabetes    Reflux    Sinus tachycardia 07/14/2012   Pt says HR > 100 is consistent   Past Surgical History:  Procedure Laterality Date   COLONOSCOPY     FOOT SURGERY Left    Right plantar facsiitis scrape   KNEE CLOSED REDUCTION Right 06/16/2024   Procedure: MANIPULATION, KNEE, CLOSED;  Surgeon: Vernetta Lonni GRADE, MD;  Location: [facility];  Service: Orthopedics;  Laterality: Right;   KNEE SURGERY Bilateral 2007   Tendon release   KNEE SURGERY Right 04/05/2024   Total R Knee Replacement   LAPAROSCOPIC GASTRIC SLEEVE RESECTION N/A 03/30/2017   Procedure: LAPAROSCOPIC GASTRIC SLEEVE RESECTION, UPPER ENDO;  Surgeon: Tanda Locus, MD;  Location: [facility];  Service: General;  Laterality: N/A;   NASAL SINUS SURGERY  2004   OVARIAN CYST REMOVAL  1998   A cyst  on the fallopian tube removed   SPINAL FUSION     TONSILLECTOMY  1996   TOTAL KNEE ARTHROPLASTY Right 04/05/2024   Procedure: ARTHROPLASTY, KNEE, TOTAL;  Surgeon: Vernetta Lonni GRADE, MD;  Location: [facility];  Service: Orthopedics;  Laterality: Right;   Patient Active Problem List   Diagnosis Date Noted   Arthrofibrosis of knee joint, right 06/15/2024   Status post total right knee replacement 04/05/2024   Actinic keratosis 01/18/2024   Absolute anemia 01/18/2024   Pruritus 01/18/2024   Bilateral carpal tunnel syndrome 11/25/2023   History of sleeve gastrectomy 11/25/2023   Generalized anxiety disorder with panic attacks 08/12/2023   Primary insomnia 08/12/2023   Chronic constipation 08/12/2023   Contusion of nose 04/23/2023   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   Chronic migraine without aura, with intractable migraine, so stated, with status migrainosus 01/25/2023   History of psychiatric treatment 01/13/2023   Viral upper respiratory tract infection 03/28/2021   Episodic tension-type headache, not intractable 11/07/2020   Hypertrophy of inferior nasal turbinate 11/07/2020   Postnasal drip 11/07/2020   Lymphadenopathy, anterior cervical 07/13/2020   Sebaceous cyst 07/13/2020   Iron  deficiency anemia secondary to inadequate  dietary iron  intake 01/12/2020   Constipation 12/13/2019   Hyperlipidemia LDL goal <130 09/08/2019   Positive colorectal cancer screening using Cologuard test 03/02/2018   Leukocytosis 01/26/2018   Onychomycosis of great toe 12/22/2017   Family history of DVT 03/20/2017   Unilateral primary osteoarthritis, right knee 05/13/2016   Dyslipidemia 03/04/2016   Right knee pain 03/03/2016   Pre-diabetes 11/01/2015   Allergic rhinitis 05/28/2015   Sleep state misperception 07/05/2014   BMI 33.0-33.9,adult 05/24/2013   Polycystic ovary disease 07/14/2012   Obstructive sleep apnea (adult) (pediatric) 02/04/2012   Perimenopausal 11/10/2011   Hot flashes 11/10/2011    Disturbance in sleep behavior 09/16/2011   Asthma 05/23/2011   Backache 05/23/2011   Bipolar disorder in partial remission 05/23/2011   Dysphagia 05/23/2011   Esophageal reflux 05/23/2011   Rhinitis, allergic 05/23/2011    PCP: Camie Moats, PA-C  REFERRING PROVIDER: Lonni CINDERELLA Poli, MD  REFERRING DIAG:  [phone] (ICD-10-CM) - Status post total right knee replacement  Post-manipulation this morning  THERAPY DIAG:  Chronic pain of right knee  Difficulty in walking, not elsewhere classified  Muscle weakness (generalized)  Localized edema  Stiffness of right knee, not elsewhere classified  Rationale for Evaluation and Treatment: Rehabilitation  ONSET DATE: Manipulation today 06/16/2024, TKA 04/05/2024  SUBJECTIVE:   SUBJECTIVE STATEMENT: Patient reports that she is feeling slightly less sore than yesterday.   PERTINENT HISTORY: OA, asthma, Bipolar, DDD L4-S1, headache, HTN, Lt leg neuropathy, obesity, pre-diabetes, plantar fascia surgery, bilateral knee surgeries, spinal fusion, bilateral carpal tunnel  Kellie had her right knee replaced 04/05/2024.  She only had home health PT due to difficulty getting to outpatient PT.  Her manipulation was this morning and she is motivated to fix her right knee.  PAIN:  Are you having pain?  Yes: NPRS scale: 7/10 today Pain location: Rt knee Pain description: Stiff, achy Aggravating factors: Bending, walking, in and out of the car Relieving factors: Ice, ibuprofen, diludid, fednyl  PRECAUTIONS: Back  RED FLAGS: None   WEIGHT BEARING RESTRICTIONS: No  FALLS:  Has patient fallen in last 6 months? No  LIVING ENVIRONMENT: Lives with: lives with their family, lives with their spouse, and mon Lives in: House/apartment Stairs: Difficulty with stairs Has following equipment at home: None  OCCUPATION: Disbailty  PLOF: Needs assistance with ADLs  PATIENT GOALS: Bend the knee normally  NEXT MD VISIT:  06/29/2024  OBJECTIVE:  Note: Objective measures were completed at Evaluation unless otherwise noted.  DIAGNOSTIC FINDINGS: Right knee arthroplasty without immediate postoperative complication.   PATIENT SURVEYS:  PSFS: THE PATIENT SPECIFIC FUNCTIONAL SCALE  Place score of 0-10 (0 = unable to perform activity and 10 = able to perform activity at the same level as before injury or problem)  Activity Date: 06/16/2024    Walk; get in/out of a vehicle; clothes out of the dryer 1 x 3    2.  Sleep; Walk on uneven ground 0 x 2    3.     4.      Total Score 0.06      Total Score = Sum of activity scores/number of activities  Minimally Detectable Change: 3 points (for single activity); 2 points (for average score)  [facility] (nd). The Patient Specific Functional Scale . Retrieved from [email or web address]   COGNITION: Overall cognitive status: Within functional limits for tasks assessed    SENSATION: No new tingling or paresthesias  EDEMA:  Noted and not objectively assessed  LOWER EXTREMITY ROM:  AROM Left/Right  06/16/24 Right  06/17/24 Right  06/20/24 Right 06/23/24 Rt:  06/24/2024  Hip flexion       Hip extension       Hip abduction       Hip adduction       Hip internal rotation       Hip external rotation       Knee flexion 131/70 (Right 95 post exercises) 98 with PT assist Supine  80A 95 P Supine  84 A Supine AROM 84deg  Knee extension 0/-13 (Right -7 post-exercises) -7 active     Ankle dorsiflexion       Ankle plantarflexion       Ankle inversion       Ankle eversion        (Blank rows = not tested)  LOWER EXTREMITY STRENGTH:  MMT Left/Right 06/16/2024   Hip flexion    Hip extension    Hip abduction    Hip adduction    Hip internal rotation    Hip external rotation    Knee flexion    Knee extension 4/2+   Ankle dorsiflexion    Ankle plantarflexion    Ankle inversion    Ankle  eversion     (Blank rows = not tested)  GAIT: Distance walked: 50 feet Assistive device utilized: None Level of assistance: Complete Independence Comments: Christalynn has limited WB endurance and walks with a very stiff gait, unable to fully extend her right knee    TREATMENT DATE: R TKA 06/24/2024 TherEx:  UBE with bilat LE only seat 12 level 2 for 8 minutes switching between fwd and backward revolutions   Patient performed weight shifts onto Lt LE before ambulating to next exercise  Knee flexion stretch with chair against wall (BUE support on chair backs) 3x10s holds then moved Lt LE 1-2 closer and performed 3x10s Seated LAQ with 2# ankle weight   TherAct:  Step up and over with 6 step leading with Rt LE up and Lt LE down 2x8 with bilat UE support // bars  Bilat leg press 2x10 with 81#  Vaso:  Rt LE elevated on wedge for 10 minutes with medium compression at 34deg   06/23/24 TherEx:  Sci Fit bike seat 12 with bilat LE only level 1 for 8 min with VC for R ankle DF Standing flexion stretch on 6 step 10x10sec Knee flexion slides 20x with slide board then with PT assist and di Supine RLE heel slide with ball & strap 5 sec hold flexion and 5 sec hold ext / leg press 10 reps TherAct:  Bilat leg press 3x10 with 75# Step up/down 6 step 10x Manual for knee flexion Vaso:  Rt LE elevated on wedge for 10 minutes with medium compression at 34deg    06/22/2024 TherEx:  Sci Fit bike seat 12 with bilat LE only level 1 for 5 minutes 2 sets (PT providing soft tissue scar mob bw sets)  More fluent rotation / flexion movement after soft tissue work Verbal cue given for increasing ankle DF  Standing flexion stretch with rt foot on chair against wall (BUE support on chair backs) 10 sec hold 3 reps 4 sets more LLE stance 1-2 closer to chair each set to increase flexion stretch. Calf stretch on 6 step 3x30s  Hamstring stretch on 6 step 3x30s Supine RLE heel slide with ball & strap 5 sec  hold flexion and 5 sec hold ext / leg press 10 reps Supine using towel to hold  femur vertical for gravity assist knee flexion 5 sec hold 5 reps PT recommended performing at least one exercise every 30-60 minutes when awake to limit knee stiffening. Pt verbalized understanding.   TherAct:  Upon arising weight shift over RLE 5 reps in bilateral stance.  Pt able to initiate gait with less antalgic pattern.  Bilat leg press 15 reps with 81# 1 set Step up & step down RLE 4 step with BUE support 10 reps with PT demo & verbal cues on technique.   Manual:  Seated knee flexion with IR/distraction 1x10 with 10s hold and PT providing PROM knee extension with distraction for 10s between each set with contract-relax Instrument assisted Soft tissue & scar mobs and suction cupping  Self-care: PT verbal & demo cues on positioning pillow bw LEs in sidelying and pillows to Tent sheets off feet.  Pt verbalized understanding.   PT demo & verbal cues on ice massage. Pt verbalized understanding.    Vaso:  Rt LE elevated on wedge with towel ext stretch for 10 minutes with high compression at 34deg                                                                                                                      TREATMENT DATE: R TKA 06/21/2024 TherEx:  UBE with bilat LE only level 1 for 10 minutes  Verbal cue given for increasing ankle DF  Calf stretch on 6 step 3x30s  Hamstring stretch on 6 step 3x30s  TherAct:  Bilat leg press 2x15 with 75#  Manual:  Seated knee flexion with IR/distraction 1x10 with 10s hold and PT providing PROM knee extension with distraction for 10s between each set   Vaso:  Rt LE elevated on wedge for 10 minutes with medium compression at 34deg    PATIENT EDUCATION:  Education details: See above Person educated: Patient Education method: Explanation, Demonstration, Tactile cues, Verbal cues, and Handouts Education comprehension: verbal cues required, tactile cues  required, and needs further education  HOME EXERCISE PROGRAM: Access Code: [email or web address] URL: [email or web address] Date: 06/16/2024 Prepared by: Lamar Ivory  Exercises - Seated Knee Flexion AAROM  - 10 x daily - 7 x weekly - 1 sets - 10 reps - 5 seconds hold - Supine Heel Slide with Strap  - 10 x daily - 7 x weekly - 1 sets - 10 reps - 5 seconds hold - Supine Quadricep Sets  - 10 x daily - 7 x weekly - 2 sets - 10 reps - 5 second hold - Seated Knee Extension AROM  - 10 x daily - 7 x weekly - 1 sets - 10 reps - 2 seconds hold  ASSESSMENT:  CLINICAL IMPRESSION:  Patient arrived to session noting improved symptoms compared to previous session. Patient tolerated all activities this date but is still functionally limited by deficits in knee flexion ROM. Patient will continue to benefit from skilled PT.   OBJECTIVE IMPAIRMENTS: Abnormal gait, decreased activity tolerance, decreased endurance, decreased mobility, difficulty walking, decreased ROM,  decreased strength, increased edema, impaired perceived functional ability, and pain.   ACTIVITY LIMITATIONS: bending, sitting, standing, squatting, sleeping, stairs, dressing, and locomotion level  PARTICIPATION LIMITATIONS: cleaning, driving, and community activity  PERSONAL FACTORS: OA, asthma, Bipolar, DDD L4-S1, headache, HTN, Lt leg neuropathy, obesity, pre-diabetes, plantar fascia surgery, bilateral knee surgeries, spinal fusion, bilateral carpal tunnel are also affecting patient's functional outcome.   REHAB POTENTIAL: Good  CLINICAL DECISION MAKING: Evolving/moderate complexity  EVALUATION COMPLEXITY: Moderate   GOALS: Goals reviewed with patient? Yes  SHORT TERM GOALS: Target date: 07/14/2024 Betsie will be independent with her day 1 HEP Baseline: Started 06/16/2024 Goal status: Ongoing  06/22/2024  2.  Improve right knee AROM to 0 - 5 - 95 degrees Baseline: 0 - 13 - 70 degrees Goal status:   Ongoing   06/22/2024  3.  Improve right quadriceps strength as assessed by functional self-report Baseline: 2+/5 Goal status: Ongoing  06/22/2024   LONG TERM GOALS: Target date: 08/11/2024  Improve Patient Specific Functional Score to 5 Baseline: 0.6 Goal status: Ongoing  06/22/2024  2.  Danijela will report right knee pain consistently 0-3/10 on the Visual Analog Scale Baseline: 7-10/10 Goal status: Ongoing  06/22/2024  3.  Improve right knee AROM to 0 - 2 - 110 degrees Baseline: 0 - 13 - 70 degrees Goal status: Ongoing  06/22/2024  4.  Improve right quadriceps strength to 4/5 MMT Baseline: 2+/5 MMT Goal status: Ongoing  06/22/2024  5.  Sintia will be able to ambulate in the community without a noticeable limp due to her range deficit Baseline: Limited by a 13 degree extension deficit Goal status: Ongoing  06/22/2024  6.  Kimberley will be independent with her long-term maintenance HEP at DC Baseline: Started 06/16/2024 Goal status: Ongoing  06/22/2024   PLAN:  PT FREQUENCY: 5 x a week for 2 weeks, then 3 x a week for 6 weeks  PT DURATION: 8 weeks  PLANNED INTERVENTIONS: 97750- Physical Performance Testing, 97110-Therapeutic exercises, 97530- Therapeutic activity, 97112- Neuromuscular re-education, 97535- Self Care, 02859- Manual therapy, [phone]- Gait training, [phone]- Electrical stimulation (unattended), 97016- Vasopneumatic device, 20560 (1-2 muscles), 20561 (3+ muscles)- Dry Needling, Patient/Family education, Balance training, Stair training, Joint mobilization, and Cryotherapy  PLAN FOR NEXT SESSION:   continue with manual therapy & exercise for Post-manipulation so heavy passive, active-assisted and active range of motion emphasis    Susannah Daring, PT, DPT 06/24/2024 1:58 PM    "

## 2024-06-27 ENCOUNTER — Ambulatory Visit: Payer: Self-pay | Admitting: Physical Therapy

## 2024-06-27 ENCOUNTER — Encounter: Payer: Self-pay | Admitting: Physical Therapy

## 2024-06-27 DIAGNOSIS — M25661 Stiffness of right knee, not elsewhere classified: Secondary | ICD-10-CM | POA: Diagnosis not present

## 2024-06-27 DIAGNOSIS — R262 Difficulty in walking, not elsewhere classified: Secondary | ICD-10-CM | POA: Diagnosis not present

## 2024-06-27 DIAGNOSIS — G8929 Other chronic pain: Secondary | ICD-10-CM

## 2024-06-27 DIAGNOSIS — M6281 Muscle weakness (generalized): Secondary | ICD-10-CM

## 2024-06-27 DIAGNOSIS — M25561 Pain in right knee: Secondary | ICD-10-CM | POA: Diagnosis not present

## 2024-06-27 DIAGNOSIS — R600 Localized edema: Secondary | ICD-10-CM

## 2024-06-27 NOTE — Therapy (Signed)
 " OUTPATIENT PHYSICAL THERAPY LOWER EXTREMITY TREATMENT   Patient Name: [name] MRN: [id number] DOB:03-29-1966, 58 y.o., female Today's Date: 06/27/2024  END OF SESSION:  PT End of Session - 06/27/24 0835     Visit Number 8    Number of Visits 28    Date for Recertification  08/11/24    Authorization Type [employer] MEDICARE    Progress Note Due on Visit 10    PT Start Time 0800    PT Stop Time 0850    PT Time Calculation (min) 50 min    Activity Tolerance Patient tolerated treatment well    Behavior During Therapy [facility] for tasks assessed/performed            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   Obesity 07/14/2012   Pneumonia    Polycystic disease, ovaries    Pre-diabetes    Reflux    Sinus tachycardia 07/14/2012   Pt says HR > 100 is consistent   Past Surgical History:  Procedure Laterality Date   COLONOSCOPY     FOOT SURGERY Left    Right plantar facsiitis scrape   KNEE CLOSED REDUCTION Right 06/16/2024   Procedure: MANIPULATION, KNEE, CLOSED;  Surgeon: Vernetta Lonni GRADE, MD;  Location: [facility];  Service: Orthopedics;  Laterality: Right;   KNEE SURGERY Bilateral 2007   Tendon release   KNEE SURGERY Right 04/05/2024   Total R Knee Replacement   LAPAROSCOPIC GASTRIC SLEEVE RESECTION N/A 03/30/2017   Procedure: LAPAROSCOPIC GASTRIC SLEEVE RESECTION, UPPER ENDO;  Surgeon: Tanda Locus, MD;  Location: [facility];  Service: General;  Laterality: N/A;   NASAL SINUS SURGERY  2004   OVARIAN CYST REMOVAL  1998   A cyst  on the fallopian tube removed   SPINAL FUSION     TONSILLECTOMY  1996   TOTAL KNEE ARTHROPLASTY Right 04/05/2024   Procedure: ARTHROPLASTY, KNEE, TOTAL;  Surgeon: Vernetta Lonni GRADE, MD;  Location: [facility];  Service: Orthopedics;  Laterality: Right;   Patient Active Problem List   Diagnosis Date Noted   Arthrofibrosis of knee joint, right 06/15/2024   Status post total right knee replacement 04/05/2024   Actinic keratosis 01/18/2024   Absolute anemia 01/18/2024   Pruritus 01/18/2024   Bilateral carpal tunnel syndrome 11/25/2023   History of sleeve gastrectomy 11/25/2023   Generalized anxiety disorder with panic attacks 08/12/2023   Primary insomnia 08/12/2023   Chronic constipation 08/12/2023   Contusion of nose 04/23/2023   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   Chronic migraine without aura, with intractable migraine, so stated, with status migrainosus 01/25/2023   History of psychiatric treatment 01/13/2023   Viral upper respiratory tract infection 03/28/2021   Episodic tension-type headache, not intractable 11/07/2020   Hypertrophy of inferior nasal turbinate 11/07/2020   Postnasal drip 11/07/2020   Lymphadenopathy, anterior cervical 07/13/2020   Sebaceous cyst 07/13/2020   Iron  deficiency anemia secondary to  inadequate dietary iron  intake 01/12/2020   Constipation 12/13/2019   Hyperlipidemia LDL goal <130 09/08/2019   Positive colorectal cancer screening using Cologuard test 03/02/2018   Leukocytosis 01/26/2018   Onychomycosis of great toe 12/22/2017   Family history of DVT 03/20/2017   Unilateral primary osteoarthritis, right knee 05/13/2016   Dyslipidemia 03/04/2016   Right knee pain 03/03/2016   Pre-diabetes 11/01/2015   Allergic rhinitis 05/28/2015   Sleep state misperception 07/05/2014   BMI 33.0-33.9,adult 05/24/2013   Polycystic ovary disease 07/14/2012   Obstructive sleep apnea (adult) (pediatric) 02/04/2012   Perimenopausal 11/10/2011   Hot flashes 11/10/2011    Disturbance in sleep behavior 09/16/2011   Asthma 05/23/2011   Backache 05/23/2011   Bipolar disorder in partial remission 05/23/2011   Dysphagia 05/23/2011   Esophageal reflux 05/23/2011   Rhinitis, allergic 05/23/2011    PCP: Camie Moats, PA-C  REFERRING PROVIDER: Lonni CINDERELLA Poli, MD  REFERRING DIAG:  [id number] (ICD-10-CM) - Status post total right knee replacement  Post-manipulation this morning  THERAPY DIAG:  Difficulty in walking, not elsewhere classified  Muscle weakness (generalized)  Localized edema  Chronic pain of right knee  Stiffness of right knee, not elsewhere classified  Rationale for Evaluation and Treatment: Rehabilitation  ONSET DATE: Manipulation today 06/16/2024, TKA 04/05/2024  SUBJECTIVE:   SUBJECTIVE STATEMENT: Pt stating she slipped out of her truck this morning. Pt stating she didn't hit the ground. Pt reporting 9/10 pain in her Rt knee today.   PERTINENT HISTORY: OA, asthma, Bipolar, DDD L4-S1, headache, HTN, Lt leg neuropathy, obesity, pre-diabetes, plantar fascia surgery, bilateral knee surgeries, spinal fusion, bilateral carpal tunnel  [name] had her right knee replaced 04/05/2024.  She only had home health PT due to difficulty getting to outpatient PT.  Her manipulation was this morning and she is motivated to fix her right knee.  PAIN:  Are you having pain?  Yes: NPRS scale: 9/10 today Pain location: Rt knee Pain description: Stiff, achy Aggravating factors: Bending, walking, in and out of the car Relieving factors: Ice, ibuprofen, diludid, fednyl  PRECAUTIONS: Back  RED FLAGS: None   WEIGHT BEARING RESTRICTIONS: No  FALLS:  Has patient fallen in last 6 months? No  LIVING ENVIRONMENT: Lives with: lives with their family, lives with their spouse, and mon Lives in: House/apartment Stairs: Difficulty with stairs Has following equipment at home: None  OCCUPATION: Disbailty  PLOF: Needs assistance with ADLs  PATIENT  GOALS: Bend the knee normally  NEXT MD VISIT: 06/29/2024  OBJECTIVE:  Note: Objective measures were completed at Evaluation unless otherwise noted.  DIAGNOSTIC FINDINGS: Right knee arthroplasty without immediate postoperative complication.   PATIENT SURVEYS:  PSFS: THE PATIENT SPECIFIC FUNCTIONAL SCALE  Place score of 0-10 (0 = unable to perform activity and 10 = able to perform activity at the same level as before injury or problem)  Activity Date: 06/16/2024    Walk; get in/out of a vehicle; clothes out of the dryer 1 x 3    2.  Sleep; Walk on uneven ground 0 x 2    3.     4.      Total Score 0.06      Total Score = Sum of activity scores/number of activities  Minimally Detectable Change: 3 points (for single activity); 2 points (for average score)  [facility] (nd). The Patient Specific Functional Scale . Retrieved from [email or web address]   COGNITION: Overall cognitive status: Within functional limits for tasks assessed    SENSATION: No new  tingling or paresthesias  EDEMA:  Noted and not objectively assessed  LOWER EXTREMITY ROM:   AROM Left/Right  06/16/24 Right  06/17/24 Right  06/20/24 Right 06/23/24 Rt:  06/24/2024 Rt  06/27/24  Hip flexion        Hip extension        Hip abduction        Hip adduction        Hip internal rotation        Hip external rotation        Knee flexion 131/70 (Right 95 post exercises) 98 with PT assist Supine  80A 95 P Supine  84 A Supine AROM 84deg Supine AAROM 72   Knee extension 0/-13 (Right -7 post-exercises) -7 active      Ankle dorsiflexion        Ankle plantarflexion        Ankle inversion        Ankle eversion         (Blank rows = not tested)  LOWER EXTREMITY STRENGTH:  MMT Left/Right 06/16/2024   Hip flexion    Hip extension    Hip abduction    Hip adduction    Hip internal rotation    Hip external rotation    Knee flexion    Knee  extension 4/2+   Ankle dorsiflexion    Ankle plantarflexion    Ankle inversion    Ankle eversion     (Blank rows = not tested)  GAIT: Distance walked: 50 feet Assistive device utilized: None Level of assistance: Complete Independence Comments: [name] has limited WB endurance and walks with a very stiff gait, unable to fully extend her right knee    TREATMENT DATE: R TKA 06/27/2024 TherEx:  UBE with bilat LE only seat 12 level 2 for 8 minutes partial revolutions progressing to full revolutions Level 0-2  Seated LAQ with 3# ankle weight  2 x 10  TherAct:  Sit to stand  2 x 10  Seated SLR: 2 x 10  Manual:  Ice massage to help break up scar tissue IASTM to pt's Rt knee Vaso:  Rt LE elevated on wedge for 10 minutes with medium compression at 34deg     06/24/2024 TherEx:  UBE with bilat LE only seat 12 level 2 for 8 minutes switching between fwd and backward revolutions   Patient performed weight shifts onto Lt LE before ambulating to next exercise  Knee flexion stretch with chair against wall (BUE support on chair backs) 3x10s holds then moved Lt LE 1-2 closer and performed 3x10s Seated LAQ with 2# ankle weight   TherAct:  Step up and over with 6 step leading with Rt LE up and Lt LE down 2x8 with bilat UE support // bars  Bilat leg press 2x10 with 81#  Vaso:  Rt LE elevated on wedge for 10 minutes with medium compression at 34deg   06/23/24 TherEx:  Sci Fit bike seat 12 with bilat LE only level 1 for 8 min with VC for R ankle DF Standing flexion stretch on 6 step 10x10sec Knee flexion slides 20x with slide board then with PT assist and di Supine RLE heel slide with ball & strap 5 sec hold flexion and 5 sec hold ext / leg press 10 reps TherAct:  Bilat leg press 3x10 with 75# Step up/down 6 step 10x Manual for knee flexion Vaso:  Rt LE elevated on wedge for 10 minutes with medium compression at 34deg    06/22/2024 TherEx:  Sci Fit bike seat 12 with bilat LE  only level 1 for 5 minutes 2 sets (PT providing soft tissue scar mob bw sets)  More fluent rotation / flexion movement after soft tissue work Verbal cue given for increasing ankle DF  Standing flexion stretch with rt foot on chair against wall (BUE support on chair backs) 10 sec hold 3 reps 4 sets more LLE stance 1-2 closer to chair each set to increase flexion stretch. Calf stretch on 6 step 3x30s  Hamstring stretch on 6 step 3x30s Supine RLE heel slide with ball & strap 5 sec hold flexion and 5 sec hold ext / leg press 10 reps Supine using towel to hold femur vertical for gravity assist knee flexion 5 sec hold 5 reps PT recommended performing at least one exercise every 30-60 minutes when awake to limit knee stiffening. Pt verbalized understanding.   TherAct:  Upon arising weight shift over RLE 5 reps in bilateral stance.  Pt able to initiate gait with less antalgic pattern.  Bilat leg press 15 reps with 81# 1 set Step up & step down RLE 4 step with BUE support 10 reps with PT demo & verbal cues on technique.   Manual:  Seated knee flexion with IR/distraction 1x10 with 10s hold and PT providing PROM knee extension with distraction for 10s between each set with contract-relax Instrument assisted Soft tissue & scar mobs and suction cupping  Self-care: PT verbal & demo cues on positioning pillow bw LEs in sidelying and pillows to Tent sheets off feet.  Pt verbalized understanding.   PT demo & verbal cues on ice massage. Pt verbalized understanding.    Vaso:  Rt LE elevated on wedge with towel ext stretch for 10 minutes with high compression at 34deg        PATIENT EDUCATION:  Education details: See above Person educated: Patient Education method: Explanation, Demonstration, Tactile cues, Verbal cues, and Handouts Education comprehension: verbal cues required, tactile cues required, and needs further education  HOME EXERCISE PROGRAM: Access Code: [email or web address] URL:  [email or web address] Date: 06/16/2024 Prepared by: Lamar Ivory  Exercises - Seated Knee Flexion AAROM  - 10 x daily - 7 x weekly - 1 sets - 10 reps - 5 seconds hold - Supine Heel Slide with Strap  - 10 x daily - 7 x weekly - 1 sets - 10 reps - 5 seconds hold - Supine Quadricep Sets  - 10 x daily - 7 x weekly - 2 sets - 10 reps - 5 second hold - Seated Knee Extension AROM  - 10 x daily - 7 x weekly - 1 sets - 10 reps - 2 seconds hold  ASSESSMENT:  CLINICAL IMPRESSION:  Pt arriving today reporting sliding out of her truck this morning. Pt reporting 9/10 pain in her Rt knee today. Pt with limited Rt knee ROM. Pt with good response to manual therapy and reporting mild decrease in her pain. Recommending continued skilled PT.    OBJECTIVE IMPAIRMENTS: Abnormal gait, decreased activity tolerance, decreased endurance, decreased mobility, difficulty walking, decreased ROM, decreased strength, increased edema, impaired perceived functional ability, and pain.   ACTIVITY LIMITATIONS: bending, sitting, standing, squatting, sleeping, stairs, dressing, and locomotion level  PARTICIPATION LIMITATIONS: cleaning, driving, and community activity  PERSONAL FACTORS: OA, asthma, Bipolar, DDD L4-S1, headache, HTN, Lt leg neuropathy, obesity, pre-diabetes, plantar fascia surgery, bilateral knee surgeries, spinal fusion, bilateral carpal tunnel are also affecting patient's functional outcome.   REHAB POTENTIAL: Good  CLINICAL DECISION MAKING: Evolving/moderate complexity  EVALUATION COMPLEXITY: Moderate   GOALS: Goals reviewed with patient? Yes  SHORT TERM GOALS: Target date: 07/14/2024 [name] will be independent with her day 1 HEP Baseline: Started 06/16/2024 Goal status: Ongoing  06/22/2024  2.  Improve right knee AROM to 0 - 5 - 95 degrees Baseline: 0 - 13 - 70 degrees Goal status:   Ongoing  06/22/2024  3.  Improve right quadriceps strength as assessed by functional  self-report Baseline: 2+/5 Goal status: Ongoing  06/22/2024   LONG TERM GOALS: Target date: 08/11/2024  Improve Patient Specific Functional Score to 5 Baseline: 0.6 Goal status: Ongoing  06/22/2024  2.  [name] will report right knee pain consistently 0-3/10 on the Visual Analog Scale Baseline: 7-10/10 Goal status: Ongoing  06/22/2024  3.  Improve right knee AROM to 0 - 2 - 110 degrees Baseline: 0 - 13 - 70 degrees Goal status: Ongoing  06/22/2024  4.  Improve right quadriceps strength to 4/5 MMT Baseline: 2+/5 MMT Goal status: Ongoing  06/22/2024  5.  [name] will be able to ambulate in the community without a noticeable limp due to her range deficit Baseline: Limited by a 13 degree extension deficit Goal status: Ongoing  06/22/2024  6.  [name] will be independent with her long-term maintenance HEP at DC Baseline: Started 06/16/2024 Goal status: Ongoing  06/22/2024   PLAN:  PT FREQUENCY: 5 x a week for 2 weeks, then 3 x a week for 6 weeks  PT DURATION: 8 weeks  PLANNED INTERVENTIONS: 97750- Physical Performance Testing, 97110-Therapeutic exercises, 97530- Therapeutic activity, 97112- Neuromuscular re-education, 97535- Self Care, 02859- Manual therapy, [phone]- Gait training, [phone]- Electrical stimulation (unattended), 97016- Vasopneumatic device, 20560 (1-2 muscles), 20561 (3+ muscles)- Dry Needling, Patient/Family education, Balance training, Stair training, Joint mobilization, and Cryotherapy  PLAN FOR NEXT SESSION:   continue with manual therapy & exercise for Post-manipulation so heavy passive, active-assisted and active range of motion emphasis    Delon Lunger, PT, MPT 06/27/2024 8:53 AM   06/27/2024 8:53 AM    "

## 2024-06-28 ENCOUNTER — Encounter: Payer: Self-pay | Admitting: Physical Therapy

## 2024-06-28 ENCOUNTER — Ambulatory Visit (INDEPENDENT_AMBULATORY_CARE_PROVIDER_SITE_OTHER): Admitting: Physical Therapy

## 2024-06-28 DIAGNOSIS — M6281 Muscle weakness (generalized): Secondary | ICD-10-CM | POA: Diagnosis not present

## 2024-06-28 DIAGNOSIS — R262 Difficulty in walking, not elsewhere classified: Secondary | ICD-10-CM | POA: Diagnosis not present

## 2024-06-28 DIAGNOSIS — R600 Localized edema: Secondary | ICD-10-CM | POA: Diagnosis not present

## 2024-06-28 DIAGNOSIS — M25561 Pain in right knee: Secondary | ICD-10-CM | POA: Diagnosis not present

## 2024-06-28 DIAGNOSIS — M25661 Stiffness of right knee, not elsewhere classified: Secondary | ICD-10-CM

## 2024-06-28 DIAGNOSIS — G8929 Other chronic pain: Secondary | ICD-10-CM | POA: Diagnosis not present

## 2024-06-28 NOTE — Therapy (Signed)
 " OUTPATIENT PHYSICAL THERAPY LOWER EXTREMITY TREATMENT & PROGRESS NOTE   Patient Name: [name] MRN: [id number] DOB:1965-07-10, 58 y.o., female Today's Date: 06/28/2024  Progress Note Reporting Period 06/16/2024 to 06/28/2024  See note below for Objective Data and Assessment of Progress/Goals.     END OF SESSION:  PT End of Session - 06/28/24 1347     Visit Number 9    Number of Visits 28    Date for Recertification  08/11/24    Authorization Type [employer] MEDICARE    Progress Note Due on Visit 10    PT Start Time 1345    PT Stop Time 1440    PT Time Calculation (min) 55 min    Activity Tolerance Patient tolerated treatment well    Behavior During Therapy WFL for tasks assessed/performed             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   Obesity 07/14/2012   Pneumonia    Polycystic disease, ovaries    Pre-diabetes    Reflux    Sinus tachycardia 07/14/2012   Pt says HR > 100 is consistent   Past Surgical History:  Procedure Laterality Date   COLONOSCOPY     FOOT SURGERY Left    Right plantar facsiitis scrape   KNEE CLOSED REDUCTION Right 06/16/2024   Procedure: MANIPULATION, KNEE, CLOSED;  Surgeon: Vernetta Lonni GRADE, MD;  Location: [facility];  Service: Orthopedics;  Laterality: Right;   KNEE SURGERY Bilateral 2007   Tendon release   KNEE SURGERY Right 04/05/2024   Total R Knee Replacement   LAPAROSCOPIC GASTRIC SLEEVE RESECTION N/A 03/30/2017   Procedure: LAPAROSCOPIC GASTRIC SLEEVE RESECTION, UPPER  ENDO;  Surgeon: Tanda Locus, MD;  Location: [facility];  Service: General;  Laterality: N/A;   NASAL SINUS SURGERY  2004   OVARIAN CYST REMOVAL  1998   A cyst on the fallopian tube removed   SPINAL FUSION     TONSILLECTOMY  1996   TOTAL KNEE ARTHROPLASTY Right 04/05/2024   Procedure: ARTHROPLASTY, KNEE, TOTAL;  Surgeon: Vernetta Lonni GRADE, MD;  Location: [facility];  Service: Orthopedics;  Laterality: Right;   Patient Active Problem List   Diagnosis Date Noted   Arthrofibrosis of knee joint, right 06/15/2024   Status post total right knee replacement 04/05/2024   Actinic keratosis 01/18/2024   Absolute anemia 01/18/2024   Pruritus 01/18/2024   Bilateral carpal tunnel syndrome 11/25/2023   History of sleeve gastrectomy 11/25/2023   Generalized anxiety disorder with panic attacks 08/12/2023   Primary insomnia 08/12/2023   Chronic constipation 08/12/2023   Contusion of nose 04/23/2023   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   Chronic migraine without aura, with intractable migraine, so stated, with status migrainosus 01/25/2023   History of psychiatric treatment 01/13/2023   Viral upper respiratory tract infection 03/28/2021   Episodic tension-type headache, not intractable 11/07/2020   Hypertrophy of inferior  nasal turbinate 11/07/2020   Postnasal drip 11/07/2020   Lymphadenopathy, anterior cervical 07/13/2020   Sebaceous cyst 07/13/2020   Iron  deficiency anemia secondary to inadequate dietary iron  intake 01/12/2020   Constipation 12/13/2019   Hyperlipidemia LDL goal <130 09/08/2019   Positive colorectal cancer screening using Cologuard test 03/02/2018   Leukocytosis 01/26/2018   Onychomycosis of great toe 12/22/2017   Family history of DVT 03/20/2017   Unilateral primary osteoarthritis, right knee 05/13/2016   Dyslipidemia 03/04/2016   Right knee pain 03/03/2016   Pre-diabetes 11/01/2015   Allergic rhinitis 05/28/2015   Sleep state misperception 07/05/2014   BMI 33.0-33.9,adult  05/24/2013   Polycystic ovary disease 07/14/2012   Obstructive sleep apnea (adult) (pediatric) 02/04/2012   Perimenopausal 11/10/2011   Hot flashes 11/10/2011   Disturbance in sleep behavior 09/16/2011   Asthma 05/23/2011   Backache 05/23/2011   Bipolar disorder in partial remission 05/23/2011   Dysphagia 05/23/2011   Esophageal reflux 05/23/2011   Rhinitis, allergic 05/23/2011    PCP: Camie Moats, PA-C  REFERRING PROVIDER: Lonni CINDERELLA Poli, MD  REFERRING DIAG:  [phone] (ICD-10-CM) - Status post total right knee replacement  Post-manipulation this morning  THERAPY DIAG:  Difficulty in walking, not elsewhere classified  Muscle weakness (generalized)  Localized edema  Chronic pain of right knee  Stiffness of right knee, not elsewhere classified  Rationale for Evaluation and Treatment: Rehabilitation  ONSET DATE: Manipulation today 06/16/2024, TKA 04/05/2024  SUBJECTIVE:   SUBJECTIVE STATEMENT: Sleep is still a problem.    PERTINENT HISTORY: OA, asthma, Bipolar, DDD L4-S1, headache, HTN, Lt leg neuropathy, obesity, pre-diabetes, plantar fascia surgery, bilateral knee surgeries, spinal fusion, bilateral carpal tunnel  Novalyn had her right knee replaced 04/05/2024.  She only had home health PT due to difficulty getting to outpatient PT.  Her manipulation was this morning and she is motivated to fix her right knee.  PAIN:  Are you having pain?  Yes: NPRS scale: in last 2 days lowest 7/10 highest 10/10 Pain location: Rt knee Pain description: Stiff, achy Aggravating factors: Bending, walking, in and out of the car Relieving factors: Ice, ibuprofen, diludid, fednyl  PRECAUTIONS: Back  RED FLAGS: None   WEIGHT BEARING RESTRICTIONS: No  FALLS:  Has patient fallen in last 6 months? No  LIVING ENVIRONMENT: Lives with: lives with their family, lives with their spouse, and mon Lives in: House/apartment Stairs: Difficulty with stairs Has following equipment at  home: None  OCCUPATION: Disbailty  PLOF: Needs assistance with ADLs  PATIENT GOALS: Bend the knee normally  NEXT MD VISIT: 06/29/2024  OBJECTIVE:  Note: Objective measures were completed at Evaluation unless otherwise noted.  DIAGNOSTIC FINDINGS: Right knee arthroplasty without immediate postoperative complication.   PATIENT SURVEYS:  PSFS: THE PATIENT SPECIFIC FUNCTIONAL SCALE  Place score of 0-10 (0 = unable to perform activity and 10 = able to perform activity at the same level as before injury or problem)  Activity Date: 06/16/2024 06/28/24   ADLS including get in/out of a vehicle; clothes out of the dryer 1 x 3  6   2.  Sleep 0 x 2 1   3. Walk including on uneven ground, ramps, curbs  1   4. stairs  1   Total Score 0.06 2.25     Total Score = Sum of activity scores/number of activities  Minimally Detectable Change: 3 points (for single activity); 2 points (for average score)  [facility] (nd). The Patient Specific Functional Scale . Retrieved from [email or web address]  COGNITION: Overall cognitive status: Within functional limits for tasks assessed    SENSATION: No new tingling or paresthesias  EDEMA:  Noted and not objectively assessed  LOWER EXTREMITY ROM:   AROM Left/Right  06/16/24 Right  06/17/24 Right  06/20/24 Right 06/23/24 Rt:  06/24/2024 Rt  06/27/24 Right 06/28/24  Hip flexion         Hip extension         Hip abduction         Hip adduction         Hip internal rotation         Hip external rotation         Knee flexion 131/70 (Right 95 post exercises) 98 with PT assist Supine  80A 95 P Supine  84 A Supine AROM 84deg Supine AAROM 72  Seated P: 84*  Knee extension 0/-13 (Right -7 post-exercises) -7 active       Ankle dorsiflexion         Ankle plantarflexion         Ankle inversion         Ankle eversion          (Blank rows = not tested)  LOWER EXTREMITY  STRENGTH:  MMT Left/Right 06/16/2024   Hip flexion    Hip extension    Hip abduction    Hip adduction    Hip internal rotation    Hip external rotation    Knee flexion    Knee extension 4/2+   Ankle dorsiflexion    Ankle plantarflexion    Ankle inversion    Ankle eversion     (Blank rows = not tested)  GAIT: Distance walked: 50 feet Assistive device utilized: None Level of assistance: Complete Independence Comments: Trinity has limited WB endurance and walks with a very stiff gait, unable to fully extend her right knee    TREATMENT DATE: R TKA 06/28/2024 TherEx:  UBE with BLEs & BUEs seat 12 level 1 with flexion stretch 5 reps, full revolutions backwards 3 min and full revolutions for 4 minutes  Gait Training: PT demo & verbal cues on gait pattern with terminal stance / swing flexion and weight acceptance / stance ext. Pt amb 20' & 45' with cane working on carry over with verbal & tactile cues.   TherAct:  Leg press BLEs 87# 15 reps; RLE only 37# 10 reps  Manual:  Seated knee flexion with IR/distraction 1x10 with 10s hold and PT providing PROM knee extension with distraction for 10s between each set with contract-relax Instrument assisted Soft tissue & scar mobs and suction cupping  Vaso:  Rt LE elevated on wedge for 10 minutes with medium compression at 34deg    TREATMENT DATE: R TKA 06/27/2024 TherEx:  UBE with bilat LE only seat 12 level 2 for 8 minutes partial revolutions progressing to full revolutions Level 0-2  Seated LAQ with 3# ankle weight  2 x 10  TherAct:  Sit to stand  2 x 10  Seated SLR: 2 x 10  Manual:  Ice massage to help break up scar tissue IASTM to pt's Rt knee Vaso:  Rt LE elevated on wedge for 10 minutes with medium compression at 34deg     06/24/2024 TherEx:  UBE with bilat LE only seat 12 level 2 for 8 minutes switching between fwd and backward revolutions   Patient performed weight shifts onto Lt LE before ambulating to next exercise   Knee flexion stretch with chair against wall (BUE  support on chair backs) 3x10s holds then moved Lt LE 1-2 closer and performed 3x10s Seated LAQ with 2# ankle weight   TherAct:  Step up and over with 6 step leading with Rt LE up and Lt LE down 2x8 with bilat UE support // bars  Bilat leg press 2x10 with 81#  Vaso:  Rt LE elevated on wedge for 10 minutes with medium compression at 34deg   06/23/24 TherEx:  Sci Fit bike seat 12 with bilat LE only level 1 for 8 min with VC for R ankle DF Standing flexion stretch on 6 step 10x10sec Knee flexion slides 20x with slide board then with PT assist and di Supine RLE heel slide with ball & strap 5 sec hold flexion and 5 sec hold ext / leg press 10 reps TherAct:  Bilat leg press 3x10 with 75# Step up/down 6 step 10x Manual for knee flexion Vaso:  Rt LE elevated on wedge for 10 minutes with medium compression at 34deg    PATIENT EDUCATION:  Education details: See above Person educated: Patient Education method: Programmer, Multimedia, Demonstration, Tactile cues, Verbal cues, and Handouts Education comprehension: verbal cues required, tactile cues required, and needs further education  HOME EXERCISE PROGRAM: Access Code: [email or web address] URL: [email or web address] Date: 06/16/2024 Prepared by: Lamar Ivory  Exercises - Seated Knee Flexion AAROM  - 10 x daily - 7 x weekly - 1 sets - 10 reps - 5 seconds hold - Supine Heel Slide with Strap  - 10 x daily - 7 x weekly - 1 sets - 10 reps - 5 seconds hold - Supine Quadricep Sets  - 10 x daily - 7 x weekly - 2 sets - 10 reps - 5 second hold - Seated Knee Extension AROM  - 10 x daily - 7 x weekly - 1 sets - 10 reps - 2 seconds hold  ASSESSMENT:  CLINICAL IMPRESSION:  Patient is continuing to report increased pain per second day due to slipping on running board getting out of her truck.  Patient was able to improve gait mechanics with PT instruction but resorts back to stiff-legged antalgic  pattern as soon as she is not focused.  Patient responds to progressive exercises and manual therapy during session but continues to arrive with stiffness in her knee.  PT is recommending continued skilled PT.   OBJECTIVE IMPAIRMENTS: Abnormal gait, decreased activity tolerance, decreased endurance, decreased mobility, difficulty walking, decreased ROM, decreased strength, increased edema, impaired perceived functional ability, and pain.   ACTIVITY LIMITATIONS: bending, sitting, standing, squatting, sleeping, stairs, dressing, and locomotion level  PARTICIPATION LIMITATIONS: cleaning, driving, and community activity  PERSONAL FACTORS: OA, asthma, Bipolar, DDD L4-S1, headache, HTN, Lt leg neuropathy, obesity, pre-diabetes, plantar fascia surgery, bilateral knee surgeries, spinal fusion, bilateral carpal tunnel are also affecting patient's functional outcome.   REHAB POTENTIAL: Good  CLINICAL DECISION MAKING: Evolving/moderate complexity  EVALUATION COMPLEXITY: Moderate   GOALS: Goals reviewed with patient? Yes  SHORT TERM GOALS: Target date: 07/14/2024 Chrystle will be independent with her day 1 HEP Baseline: Started 06/16/2024 Goal status: Ongoing  06/28/2024  2.  Improve right knee AROM to 0 - 5 - 95 degrees Baseline: 0 - 13 - 70 degrees Goal status:   Ongoing  06/28/2024  3.  Improve right quadriceps strength as assessed by functional self-report Baseline: 2+/5 Goal status: Ongoing  06/28/2024   LONG TERM GOALS: Target date: 08/11/2024  Improve Patient Specific Functional Score to 5 Baseline: 0.6 Goal status: Ongoing  06/28/2024  2.  Sari  will report right knee pain consistently 0-3/10 on the Visual Analog Scale Baseline: 7-10/10 Goal status: Ongoing  06/28/2024  3.  Improve right knee AROM to 0 - 2 - 110 degrees Baseline: 0 - 13 - 70 degrees Goal status: Ongoing  06/28/2024  4.  Improve right quadriceps strength to 4/5 MMT Baseline: 2+/5 MMT Goal status: Ongoing   06/28/2024  5.  Demecia will be able to ambulate in the community without a noticeable limp due to her range deficit Baseline: Limited by a 13 degree extension deficit Goal status: Ongoing  06/28/2024  6.  Delailah will be independent with her long-term maintenance HEP at DC Baseline: Started 06/16/2024 Goal status: Ongoing  06/28/2024   PLAN:  PT FREQUENCY: 5 x a week for 2 weeks, then 3 x a week for 6 weeks  PT DURATION: 8 weeks  PLANNED INTERVENTIONS: 97750- Physical Performance Testing, 97110-Therapeutic exercises, 97530- Therapeutic activity, 97112- Neuromuscular re-education, 97535- Self Care, [id number]- Manual therapy, [phone]- Gait training, [phone]- Electrical stimulation (unattended), [id number]- Vasopneumatic device, 20560 (1-2 muscles), 20561 (3+ muscles)- Dry Needling, Patient/Family education, Balance training, Stair training, Joint mobilization, and Cryotherapy  PLAN FOR NEXT SESSION: check MD note, continue with manual therapy & exercise for Post-manipulation so heavy passive, active-assisted and active range of motion emphasis    Grayce Spatz, PT, DPT 06/28/2024, 4:18 PM  "

## 2024-06-29 ENCOUNTER — Ambulatory Visit: Admitting: Orthopaedic Surgery

## 2024-06-29 ENCOUNTER — Ambulatory Visit: Admitting: Physical Therapy

## 2024-06-29 ENCOUNTER — Encounter: Payer: Self-pay | Admitting: Physical Therapy

## 2024-06-29 DIAGNOSIS — M25561 Pain in right knee: Secondary | ICD-10-CM

## 2024-06-29 DIAGNOSIS — G8929 Other chronic pain: Secondary | ICD-10-CM

## 2024-06-29 DIAGNOSIS — M6281 Muscle weakness (generalized): Secondary | ICD-10-CM | POA: Diagnosis not present

## 2024-06-29 DIAGNOSIS — R262 Difficulty in walking, not elsewhere classified: Secondary | ICD-10-CM | POA: Diagnosis not present

## 2024-06-29 DIAGNOSIS — Z96651 Presence of right artificial knee joint: Secondary | ICD-10-CM

## 2024-06-29 DIAGNOSIS — R600 Localized edema: Secondary | ICD-10-CM | POA: Diagnosis not present

## 2024-06-29 DIAGNOSIS — M25661 Stiffness of right knee, not elsewhere classified: Secondary | ICD-10-CM

## 2024-06-29 NOTE — Progress Notes (Signed)
 The patient is now 2 weeks status post a right knee manipulation under anesthesia.  She is getting close to 3 months out from a right total knee replacement to treat significant right knee pain and arthritis.  She is only 58 years old.  She is in chronic pain management.  We were able to get her knee almost fully flexed.  She is having still difficulty with full flexion and extension.  She reports that she had a fall just a couple days ago.  She said physical therapy has been able to flex her to about 97 degrees.  On exam today her extension lacks full extension by about 3 degrees and I can flex her to maybe 70 degrees.  She knows that she has to keep pushing this on her own.  The next time we need to see her is now over 3 months unless she is having issues.  Will have a standing AP and lateral of her right knee at that visit.

## 2024-06-29 NOTE — Therapy (Signed)
 " OUTPATIENT PHYSICAL THERAPY LOWER EXTREMITY TREATMENT   Patient Name: [name] MRN: [id number] DOB:[birth year], 58 y.o., female Today's Date: 06/29/2024  END OF SESSION:  PT End of Session - 06/29/24 1007     Visit Number 10    Number of Visits 28    Date for Recertification  08/11/24    Authorization Type [employer] MEDICARE    Progress Note Due on Visit 19    PT Start Time 1006    PT Stop Time 1110    PT Time Calculation (min) 64 min    Activity Tolerance Patient tolerated treatment well    Behavior During Therapy WFL for tasks assessed/performed              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   Obesity 07/14/2012   Pneumonia    Polycystic disease, ovaries    Pre-diabetes    Reflux    Sinus tachycardia 07/14/2012   Pt says HR > 100 is consistent   Past Surgical History:  Procedure Laterality Date   COLONOSCOPY     FOOT SURGERY Left    Right plantar facsiitis scrape   KNEE CLOSED REDUCTION Right 06/16/2024   Procedure: MANIPULATION, KNEE, CLOSED;  Surgeon: Vernetta Lonni GRADE, MD;  Location: [facility];  Service: Orthopedics;  Laterality: Right;   KNEE SURGERY Bilateral 2007   Tendon release   KNEE SURGERY Right 04/05/2024   Total R Knee Replacement   LAPAROSCOPIC GASTRIC SLEEVE RESECTION N/A 03/30/2017   Procedure: LAPAROSCOPIC GASTRIC SLEEVE RESECTION, UPPER ENDO;  Surgeon: Tanda Locus, MD;  Location: [facility];  Service: General;  Laterality: N/A;   NASAL SINUS SURGERY  2004   OVARIAN CYST REMOVAL  1998   A  cyst on the fallopian tube removed   SPINAL FUSION     TONSILLECTOMY  1996   TOTAL KNEE ARTHROPLASTY Right 04/05/2024   Procedure: ARTHROPLASTY, KNEE, TOTAL;  Surgeon: Vernetta Lonni GRADE, MD;  Location: [facility];  Service: Orthopedics;  Laterality: Right;   Patient Active Problem List   Diagnosis Date Noted   Arthrofibrosis of knee joint, right 06/15/2024   Status post total right knee replacement 04/05/2024   Actinic keratosis 01/18/2024   Absolute anemia 01/18/2024   Pruritus 01/18/2024   Bilateral carpal tunnel syndrome 11/25/2023   History of sleeve gastrectomy 11/25/2023   Generalized anxiety disorder with panic attacks 08/12/2023   Primary insomnia 08/12/2023   Chronic constipation 08/12/2023   Contusion of nose 04/23/2023   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   Chronic migraine without aura, with intractable migraine, so stated, with status migrainosus 01/25/2023   History of psychiatric treatment 01/13/2023   Viral upper respiratory tract infection 03/28/2021   Episodic tension-type headache, not intractable 11/07/2020   Hypertrophy of inferior nasal turbinate 11/07/2020   Postnasal drip 11/07/2020   Lymphadenopathy, anterior cervical 07/13/2020   Sebaceous cyst 07/13/2020   Iron  deficiency anemia  secondary to inadequate dietary iron  intake 01/12/2020   Constipation 12/13/2019   Hyperlipidemia LDL goal <130 09/08/2019   Positive colorectal cancer screening using Cologuard test 03/02/2018   Leukocytosis 01/26/2018   Onychomycosis of great toe 12/22/2017   Family history of DVT 03/20/2017   Unilateral primary osteoarthritis, right knee 05/13/2016   Dyslipidemia 03/04/2016   Right knee pain 03/03/2016   Pre-diabetes 11/01/2015   Allergic rhinitis 05/28/2015   Sleep state misperception 07/05/2014   BMI 33.0-33.9,adult 05/24/2013   Polycystic ovary disease 07/14/2012   Obstructive sleep apnea (adult) (pediatric) 02/04/2012   Perimenopausal 11/10/2011   Hot flashes  11/10/2011   Disturbance in sleep behavior 09/16/2011   Asthma 05/23/2011   Backache 05/23/2011   Bipolar disorder in partial remission 05/23/2011   Dysphagia 05/23/2011   Esophageal reflux 05/23/2011   Rhinitis, allergic 05/23/2011    PCP: Camie Moats, PA-C  REFERRING PROVIDER: Lonni CINDERELLA Poli, MD  REFERRING DIAG:  [phone] (ICD-10-CM) - Status post total right knee replacement  Post-manipulation this morning  THERAPY DIAG:  Difficulty in walking, not elsewhere classified  Muscle weakness (generalized)  Localized edema  Chronic pain of right knee  Stiffness of right knee, not elsewhere classified  Rationale for Evaluation and Treatment: Rehabilitation  ONSET DATE: Manipulation 06/16/2024, TKA 04/05/2024  SUBJECTIVE:   SUBJECTIVE STATEMENT: She saw Dr. Poli this morning who thinks no injuries from slip 3 days ago but needs to keep working her knee.   PERTINENT HISTORY: OA, asthma, Bipolar, DDD L4-S1, headache, HTN, Lt leg neuropathy, obesity, pre-diabetes, plantar fascia surgery, bilateral knee surgeries, spinal fusion, bilateral carpal tunnel  [name] had her right knee replaced 04/05/2024.  She only had home health PT due to difficulty getting to outpatient PT.  Her manipulation was this morning and she is motivated to fix her right knee.  PAIN:  Are you having pain?  Yes: NPRS scale: in last 2 days lowest 7/10 highest 10/10 Pain location: Rt knee Pain description: Stiff, achy Aggravating factors: Bending, walking, in and out of the car Relieving factors: Ice, ibuprofen, diludid, fednyl  PRECAUTIONS: Back  RED FLAGS: None   WEIGHT BEARING RESTRICTIONS: No  FALLS:  Has patient fallen in last 6 months? No  LIVING ENVIRONMENT: Lives with: lives with their family, lives with their spouse, and mon Lives in: House/apartment Stairs: Difficulty with stairs Has following equipment at home: None  OCCUPATION: Disbailty  PLOF: Needs assistance with  ADLs  PATIENT GOALS: Bend the knee normally  NEXT MD VISIT: 06/29/2024  OBJECTIVE:  Note: Objective measures were completed at Evaluation unless otherwise noted.  DIAGNOSTIC FINDINGS: Right knee arthroplasty without immediate postoperative complication.   PATIENT SURVEYS:  PSFS: THE PATIENT SPECIFIC FUNCTIONAL SCALE  Place score of 0-10 (0 = unable to perform activity and 10 = able to perform activity at the same level as before injury or problem)  Activity Date: 06/16/2024 06/28/24   ADLS including get in/out of a vehicle; clothes out of the dryer 1 x 3  6   2.  Sleep 0 x 2 1   3. Walk including on uneven ground, ramps, curbs  1   4. stairs  1   Total Score 0.06 2.25     Total Score = Sum of activity scores/number of activities  Minimally Detectable Change: 3 points (for single activity); 2 points (for average score)  [facility] (nd). The Patient Specific Functional Scale . Retrieved from [email or web address]   COGNITION: Overall cognitive status: Within functional limits for  tasks assessed    SENSATION: No new tingling or paresthesias  EDEMA:  Noted and not objectively assessed  LOWER EXTREMITY ROM:   AROM Left/Right  06/16/24 Right  06/17/24 Right  06/20/24 Right 06/23/24 Rt:  06/24/24 Rt  06/27/24 Right 06/28/24  Hip flexion         Hip extension         Hip abduction         Hip adduction         Hip internal rotation         Hip external rotation         Knee flexion 131/70 (Right 95 post exercises) 98 with PT assist Supine  80A 95 P Supine  84 A Supine AROM 84deg Supine AAROM 72  Seated P: 84*  Knee extension 0/-13 (Right -7 post-exercises) -7 active       Ankle dorsiflexion         Ankle plantarflexion         Ankle inversion         Ankle eversion          (Blank rows = not tested)  LOWER EXTREMITY STRENGTH:  MMT Left/Right  06/16/24   Hip flexion    Hip extension     Hip abduction    Hip adduction    Hip internal rotation    Hip external rotation    Knee flexion    Knee extension 4/2+   Ankle dorsiflexion    Ankle plantarflexion    Ankle inversion    Ankle eversion     (Blank rows = not tested)  GAIT: Distance walked: 50 feet Assistive device utilized: None Level of assistance: Complete Independence Comments: Aime has limited WB endurance and walks with a very stiff gait, unable to fully extend her right knee    TREATMENT DATE: R TKA 06/29/2024 TherEx:  UBE with BLEs & BUEs seat 12 level 1 with flexion stretch 2 reps, full revolutions backwards 2 min and full revolutions forward for 6 minutes Quad stretch with RLE over edge of mat & knee flex with strap 30 sec hold 3 reps Hamstring stretch seated with RLE long sit & strap assist along with maintaining pressure on knee to prevent flexing 30 sec hold 3 reps Standing right knee flexion stretch with foot edge of chair against wall 10 sec hold 3 reps 3 sets moving stance foot closer each set to increase stretch.    Gait Training: Pt amb 100' x 2 with cane working on carry over with verbal cues on gait pattern with terminal stance / swing flexion and weight acceptance / stance ext and upright trunk.   TherAct:  PT demo & verbal cues on use of 24 bar stool for standing activities to build tolerance with partial weight on RLE and sit to stand without UE support & sitting down with light touch for safety.  Pt verbalized and return demo understanding.  Step up and over with 6 step leading with Rt LE up and Lt LE down 2x8 with bilat UE support // bars. PT cues on technique.   Manual:  Seated knee flexion with IR/distraction 1x10 with 10s hold and PT providing PROM knee extension with distraction for 10s between each set with contract-relax Instrument assisted Soft tissue & scar mobs and suction cupping Grade IV mobs for ext  Vaso:  Rt LE elevated on wedge for 10 minutes with medium compression  at 34deg     TREATMENT DATE: R  TKA 06/28/2024 TherEx:  UBE with BLEs & BUEs seat 12 level 1 with flexion stretch 5 reps, full revolutions backwards 3 min and full revolutions for 4 minutes  Gait Training: PT demo & verbal cues on gait pattern with terminal stance / swing flexion and weight acceptance / stance ext. Pt amb 20' & 7' with cane working on carry over with verbal & tactile cues.   TherAct:  Leg press BLEs 87# 15 reps; RLE only 37# 10 reps  Manual:  Seated knee flexion with IR/distraction 1x10 with 10s hold and PT providing PROM knee extension with distraction for 10s between each set with contract-relax Instrument assisted Soft tissue & scar mobs and suction cupping  Vaso:  Rt LE elevated on wedge for 10 minutes with medium compression at 34deg    TREATMENT DATE: R TKA 06/27/2024 TherEx:  UBE with bilat LE only seat 12 level 2 for 8 minutes partial revolutions progressing to full revolutions Level 0-2  Seated LAQ with 3# ankle weight  2 x 10  TherAct:  Sit to stand  2 x 10  Seated SLR: 2 x 10  Manual:  Ice massage to help break up scar tissue IASTM to pt's Rt knee Vaso:  Rt LE elevated on wedge for 10 minutes with medium compression at 34deg     06/24/2024 TherEx:  UBE with bilat LE only seat 12 level 2 for 8 minutes switching between fwd and backward revolutions   Patient performed weight shifts onto Lt LE before ambulating to next exercise  Knee flexion stretch with chair against wall (BUE support on chair backs) 3x10s holds then moved Lt LE 1-2 closer and performed 3x10s Seated LAQ with 2# ankle weight   TherAct:  Step up and over with 6 step leading with Rt LE up and Lt LE down 2x8 with bilat UE support // bars  Bilat leg press 2x10 with 81#  Vaso:  Rt LE elevated on wedge for 10 minutes with medium compression at 34deg    HOME EXERCISE PROGRAM: Access Code: [email or web address] URL: [email or web address] Date: 06/29/2024 Prepared by: Grayce Spatz  Exercises - Seated Knee Flexion AAROM  - 10 x daily - 7 x weekly - 1 sets - 10 reps - 5 seconds hold - Supine Heel Slide with Strap  - 10 x daily - 7 x weekly - 1 sets - 10 reps - 5 seconds hold - Supine Quadricep Sets  - 10 x daily - 7 x weekly - 2 sets - 10 reps - 5 second hold - Seated Knee Extension AROM  - 10 x daily - 7 x weekly - 1 sets - 10 reps - 2 seconds hold - Standing Knee Flexion Stretch on Step  - 1 x daily - 7 x weekly - 3-5 sets - 3 reps - 10 seconds hold - Gastroc Stretch on Step  - 1-2 x daily - 7 x weekly - 1 sets - 3 reps - 30 seconds hold - Seated Table Hamstring Stretch  - 1-2 x daily - 7 x weekly - 1 sets - 3 reps - 30 seconds hold - Supine Quadriceps Stretch with Strap on Table  - 1-2 x daily - 7 x weekly - 1 sets - 3 reps - 30 seconds hold  ASSESSMENT:  CLINICAL IMPRESSION:  Patient improved gait mechanics but has to focus with slow gait.  She appears to benefit from muscle stretches.   Patient responds to progressive exercises and manual therapy during session but continues to  arrive with stiffness in her knee.  PT is recommending continued skilled PT.   OBJECTIVE IMPAIRMENTS: Abnormal gait, decreased activity tolerance, decreased endurance, decreased mobility, difficulty walking, decreased ROM, decreased strength, increased edema, impaired perceived functional ability, and pain.   ACTIVITY LIMITATIONS: bending, sitting, standing, squatting, sleeping, stairs, dressing, and locomotion level  PARTICIPATION LIMITATIONS: cleaning, driving, and community activity  PERSONAL FACTORS: OA, asthma, Bipolar, DDD L4-S1, headache, HTN, Lt leg neuropathy, obesity, pre-diabetes, plantar fascia surgery, bilateral knee surgeries, spinal fusion, bilateral carpal tunnel are also affecting patient's functional outcome.   REHAB POTENTIAL: Good  CLINICAL DECISION MAKING: Evolving/moderate complexity  EVALUATION COMPLEXITY: Moderate   GOALS: Goals reviewed with patient?  Yes  SHORT TERM GOALS: Target date: 07/14/2024 [name] will be independent with her day 1 HEP Baseline: Started 06/16/2024 Goal status: Ongoing  06/28/2024  2.  Improve right knee AROM to 0 - 5 - 95 degrees Baseline: 0 - 13 - 70 degrees Goal status:   Ongoing  06/28/2024  3.  Improve right quadriceps strength as assessed by functional self-report Baseline: 2+/5 Goal status: Ongoing  06/28/2024   LONG TERM GOALS: Target date: 08/11/2024  Improve Patient Specific Functional Score to 5 Baseline: 0.6 Goal status: Ongoing  06/28/2024  2.  [name] will report right knee pain consistently 0-3/10 on the Visual Analog Scale Baseline: 7-10/10 Goal status: Ongoing  06/28/2024  3.  Improve right knee AROM to 0 - 2 - 110 degrees Baseline: 0 - 13 - 70 degrees Goal status: Ongoing  06/28/2024  4.  Improve right quadriceps strength to 4/5 MMT Baseline: 2+/5 MMT Goal status: Ongoing  06/28/2024  5.  [name] will be able to ambulate in the community without a noticeable limp due to her range deficit Baseline: Limited by a 13 degree extension deficit Goal status: Ongoing  06/28/2024  6.  [name] will be independent with her long-term maintenance HEP at DC Baseline: Started 06/16/2024 Goal status: Ongoing  06/28/2024   PLAN:  PT FREQUENCY: 5 x a week for 2 weeks, then 3 x a week for 6 weeks  PT DURATION: 8 weeks  PLANNED INTERVENTIONS: 97750- Physical Performance Testing, 97110-Therapeutic exercises, 97530- Therapeutic activity, 97112- Neuromuscular re-education, 97535- Self Care, 02859- Manual therapy, [phone]- Gait training, [phone]- Electrical stimulation (unattended), 97016- Vasopneumatic device, 20560 (1-2 muscles), 20561 (3+ muscles)- Dry Needling, Patient/Family education, Balance training, Stair training, Joint mobilization, and Cryotherapy  PLAN FOR NEXT SESSION: continue with manual therapy & exercise for Post-manipulation so heavy passive, active-assisted and active range of motion  emphasis    Grayce Spatz, PT, DPT 06/29/2024, 12:00 PM  "

## 2024-07-01 ENCOUNTER — Ambulatory Visit: Admitting: Rehabilitative and Restorative Service Providers"

## 2024-07-01 ENCOUNTER — Encounter: Payer: Self-pay | Admitting: Rehabilitative and Restorative Service Providers"

## 2024-07-01 DIAGNOSIS — M25561 Pain in right knee: Secondary | ICD-10-CM

## 2024-07-01 DIAGNOSIS — R262 Difficulty in walking, not elsewhere classified: Secondary | ICD-10-CM

## 2024-07-01 DIAGNOSIS — M6281 Muscle weakness (generalized): Secondary | ICD-10-CM

## 2024-07-01 DIAGNOSIS — M25661 Stiffness of right knee, not elsewhere classified: Secondary | ICD-10-CM

## 2024-07-01 DIAGNOSIS — R600 Localized edema: Secondary | ICD-10-CM | POA: Diagnosis not present

## 2024-07-01 DIAGNOSIS — G8929 Other chronic pain: Secondary | ICD-10-CM | POA: Diagnosis not present

## 2024-07-01 NOTE — Therapy (Signed)
 "    [name], PT  Physical Therapist Specialty: Physical Therapy   Therapy    Signed   Encounter Date: 06/29/2024   Signed     Expand All Collapse All   OUTPATIENT PHYSICAL THERAPY LOWER EXTREMITY TREATMENT     Patient Name: [name] MRN: [id number] DOB:January 20, 1966, 58 y.o., female Today's Date: 06/29/2024   END OF SESSION:   PT End of Session - 06/29/24 1007       Visit Number 11     Number of Visits 28     Date for Recertification  08/11/24     Authorization Type [employer] MEDICARE     Progress Note Due on Visit 19     PT Start Time 1012     PT Stop Time 1101     PT Time Calculation (min) 49 min     Activity Tolerance Patient tolerated treatment well; Patient limited by pain; No increased pain    Behavior During Therapy WFL for tasks assessed/performed                          Past Medical History:  Diagnosis Date   Allergy     Anxiety     Arthritis     Asthma      History of Asthma   Bipolar disorder (HCC)     Chest pain, atypical 11/30/2012   Clotting disorder     Depression     GERD (gastroesophageal reflux disease)     H/O degenerative disc disease      L4-L5, L5-S1   Headache     Hyperglycemia      Postoperative hyperglycemia   Hypertension      hx of  not on meds in 7 years   Neuromuscular disorder (HCC)      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   Obesity 07/14/2012   Pneumonia     Polycystic disease, ovaries     Pre-diabetes     Reflux     Sinus tachycardia 07/14/2012    Pt says HR > 100 is consistent             Past Surgical History:  Procedure Laterality Date   COLONOSCOPY       FOOT SURGERY Left      Right plantar facsiitis scrape   KNEE CLOSED REDUCTION Right 06/16/2024    Procedure: MANIPULATION, KNEE, CLOSED;  Surgeon: Vernetta Lonni GRADE, MD;  Location: [facility];  Service: Orthopedics;  Laterality: Right;   KNEE SURGERY Bilateral 2007    Tendon release   KNEE SURGERY Right 04/05/2024     Total R Knee Replacement   LAPAROSCOPIC GASTRIC SLEEVE RESECTION N/A 03/30/2017    Procedure: LAPAROSCOPIC GASTRIC SLEEVE RESECTION, UPPER ENDO;  Surgeon: Tanda Locus, MD;  Location: [facility];  Service: General;  Laterality: N/A;   NASAL SINUS SURGERY   2004   OVARIAN CYST REMOVAL   1998    A cyst on the fallopian tube removed   SPINAL FUSION       TONSILLECTOMY   1996   TOTAL KNEE ARTHROPLASTY Right 04/05/2024    Procedure: ARTHROPLASTY, KNEE, TOTAL;  Surgeon: Vernetta Lonni GRADE, MD;  Location: [facility];  Service: Orthopedics;  Laterality: Right;            Patient Active Problem List    Diagnosis Date Noted   Arthrofibrosis of knee joint, right 06/15/2024   Status post total  right knee replacement 04/05/2024   Actinic keratosis 01/18/2024   Absolute anemia 01/18/2024   Pruritus 01/18/2024   Bilateral carpal tunnel syndrome 11/25/2023   History of sleeve gastrectomy 11/25/2023   Generalized anxiety disorder with panic attacks 08/12/2023   Primary insomnia 08/12/2023   Chronic constipation 08/12/2023   Contusion of nose 04/23/2023   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   Chronic migraine without aura, with intractable migraine, so stated, with status migrainosus 01/25/2023   History of psychiatric treatment 01/13/2023   Viral upper respiratory tract infection 03/28/2021   Episodic tension-type headache, not intractable 11/07/2020   Hypertrophy of inferior nasal turbinate 11/07/2020   Postnasal drip 11/07/2020   Lymphadenopathy, anterior cervical 07/13/2020   Sebaceous cyst 07/13/2020   Iron  deficiency anemia secondary to inadequate dietary iron  intake 01/12/2020   Constipation 12/13/2019   Hyperlipidemia LDL goal <130 09/08/2019   Positive colorectal cancer screening using Cologuard test 03/02/2018   Leukocytosis 01/26/2018   Onychomycosis of great toe 12/22/2017   Family history of DVT 03/20/2017   Unilateral primary osteoarthritis, right knee 05/13/2016   Dyslipidemia  03/04/2016   Right knee pain 03/03/2016   Pre-diabetes 11/01/2015   Allergic rhinitis 05/28/2015   Sleep state misperception 07/05/2014   BMI 33.0-33.9,adult 05/24/2013   Polycystic ovary disease 07/14/2012   Obstructive sleep apnea (adult) (pediatric) 02/04/2012   Perimenopausal 11/10/2011   Hot flashes 11/10/2011   Disturbance in sleep behavior 09/16/2011   Asthma 05/23/2011   Backache 05/23/2011   Bipolar disorder in partial remission 05/23/2011   Dysphagia 05/23/2011   Esophageal reflux 05/23/2011   Rhinitis, allergic 05/23/2011      PCP: Camie Moats, PA-C   REFERRING PROVIDER: Lonni CINDERELLA Poli, MD   REFERRING DIAG:  [phone] (ICD-10-CM) - Status post total right knee replacement  Post-manipulation this morning   THERAPY DIAG:  Difficulty in walking, not elsewhere classified   Muscle weakness (generalized)   Localized edema   Chronic pain of right knee   Stiffness of right knee, not elsewhere classified   Rationale for Evaluation and Treatment: Rehabilitation   ONSET DATE: Manipulation 06/16/2024, TKA 04/05/2024   SUBJECTIVE:    SUBJECTIVE STATEMENT: [name] reports consistency with her HEP, although she admits she is pain limited.     PERTINENT HISTORY: OA, asthma, Bipolar, DDD L4-S1, headache, HTN, Lt leg neuropathy, obesity, pre-diabetes, plantar fascia surgery, bilateral knee surgeries, spinal fusion, bilateral carpal tunnel   [name] had her right knee replaced 04/05/2024.  She only had home health PT due to difficulty getting to outpatient PT.  Her manipulation was this morning and she is motivated to fix her right knee.   PAIN:  Are you having pain?  Yes: NPRS scale: Remains 7-10/10 Pain location: Rt knee Pain description: Stiff, achy Aggravating factors: Bending, walking, in and out of the car Relieving factors: Ice, ibuprofen, diludid, off fentanyl    PRECAUTIONS: Back   RED FLAGS: None       WEIGHT BEARING RESTRICTIONS: No   FALLS:  Has  patient fallen in last 6 months? No   LIVING ENVIRONMENT: Lives with: lives with their family, lives with their spouse, and mon Lives in: House/apartment Stairs: Difficulty with stairs Has following equipment at home: None   OCCUPATION: Disbailty   PLOF: Needs assistance with ADLs   PATIENT GOALS: Bend the knee normally   NEXT MD VISIT: 06/29/2024   OBJECTIVE:  Note: Objective measures were completed at Evaluation unless otherwise noted.   DIAGNOSTIC FINDINGS: Right knee arthroplasty without immediate postoperative complication.  PATIENT SURVEYS:  PSFS: THE PATIENT SPECIFIC FUNCTIONAL SCALE   Place score of 0-10 (0 = unable to perform activity and 10 = able to perform activity at the same level as before injury or problem)   Activity Date: 06/16/2024 06/28/24    ADLS including get in/out of a vehicle; clothes out of the dryer 1 x 3  6    2.  Sleep 0 x 2 1    3. Walk including on uneven ground, ramps, curbs   1    4. stairs   1    Total Score 0.06 2.25        Total Score = Sum of activity scores/number of activities   Minimally Detectable Change: 3 points (for single activity); 2 points (for average score)   [employer] (nd). The Patient Specific Functional Scale . Retrieved from [email or web address]    COGNITION: Overall cognitive status: Within functional limits for tasks assessed                SENSATION: No new tingling or paresthesias   EDEMA:  Noted and not objectively assessed   LOWER EXTREMITY ROM:    AROM Left/Right  06/16/24 Right  06/17/24 Right  06/20/24 Right 06/23/24 Rt:  06/24/24 Rt  06/27/24 Right 06/28/24 Right 07/01/2024  Hip flexion                 Hip extension                 Hip abduction                 Hip adduction                 Hip internal rotation                 Hip external rotation                 Knee flexion 131/70 (Right 95 post exercises) 98 with PT  assist Supine  80A 95 P Supine  84 A Supine AROM 84 deg Supine AAROM 72  Seated P: 84* Active 81  Knee extension 0/-13 (Right -7 post-exercises) -7 active           Active -9   Ankle dorsiflexion                 Ankle plantarflexion                 Ankle inversion                 Ankle eversion                  (Blank rows = not tested)   LOWER EXTREMITY STRENGTH:   MMT Left/Right  06/16/24    Hip flexion      Hip extension      Hip abduction      Hip adduction      Hip internal rotation      Hip external rotation      Knee flexion      Knee extension 4/2+    Ankle dorsiflexion      Ankle plantarflexion      Ankle inversion      Ankle eversion       (Blank rows = not tested)   GAIT: Distance walked: 50 feet Assistive device utilized: None Level of assistance: Complete Independence Comments: Asyria has limited WB endurance and walks with a very  stiff gait, unable to fully extend her right knee     TREATMENT DATE: R TKA 07/01/2024 SciFit Bike Seat 12 for 5 minutes Resistance Level 5 Seated knee flexion active assisted range of motion 5 sets of 3 for 10 seconds each with PT overpressure Supine knee flexion stretch with strap and PT overpressure 10 x 10 seconds with a quad set between reps  Supine quad sets with right heel prop 10 x 5 seconds  Functional Activities: Double Leg Press 100# full range 15 x slow eccentrics and stretch into flexion and extension Single Leg Press 50# full range 10 x slow eccentrics and stretch into flexion and extension   06/29/2024 TherEx:  UBE with BLEs & BUEs seat 12 level 1 with flexion stretch 2 reps, full revolutions backwards 2 min and full revolutions forward for 6 minutes Quad stretch with RLE over edge of mat & knee flex with strap 30 sec hold 3 reps Hamstring stretch seated with RLE long sit & strap assist along with maintaining pressure on knee to prevent flexing 30 sec hold 3 reps Standing right knee flexion stretch with foot  edge of chair against wall 10 sec hold 3 reps 3 sets moving stance foot closer each set to increase stretch.     Gait Training: Pt amb 100' x 2 with cane working on carry over with verbal cues on gait pattern with terminal stance / swing flexion and weight acceptance / stance ext and upright trunk.    TherAct:  PT demo & verbal cues on use of 24 bar stool for standing activities to build tolerance with partial weight on RLE and sit to stand without UE support & sitting down with light touch for safety.  Pt verbalized and return demo understanding.  Step up and over with 6 step leading with Rt LE up and Lt LE down 2x8 with bilat UE support // bars. PT cues on technique.    Manual:  Seated knee flexion with IR/distraction 1x10 with 10s hold and PT providing PROM knee extension with distraction for 10s between each set with contract-relax Instrument assisted Soft tissue & scar mobs and suction cupping Grade IV mobs for ext   Vaso:  Rt LE elevated on wedge for 10 minutes with medium compression at 34deg        TREATMENT DATE: R TKA 06/28/2024 TherEx:  UBE with BLEs & BUEs seat 12 level 1 with flexion stretch 5 reps, full revolutions backwards 3 min and full revolutions for 4 minutes   Gait Training: PT demo & verbal cues on gait pattern with terminal stance / swing flexion and weight acceptance / stance ext. Pt amb 20' & 14' with cane working on carry over with verbal & tactile cues.    TherAct:  Leg press BLEs 87# 15 reps; RLE only 37# 10 reps   Manual:  Seated knee flexion with IR/distraction 1x10 with 10s hold and PT providing PROM knee extension with distraction for 10s between each set with contract-relax Instrument assisted Soft tissue & scar mobs and suction cupping   Vaso:  Rt LE elevated on wedge for 10 minutes with medium compression at 34deg      HOME EXERCISE PROGRAM: Access Code: [email or web address] URL: [email or web address] Date: 06/29/2024 Prepared by: Grayce Spatz   Exercises - Seated Knee Flexion AAROM  - 10 x daily - 7 x weekly - 1 sets - 10 reps - 5 seconds hold - Supine Heel Slide with Strap  - 10 x daily -  7 x weekly - 1 sets - 10 reps - 5 seconds hold - Supine Quadricep Sets  - 10 x daily - 7 x weekly - 2 sets - 10 reps - 5 second hold - Seated Knee Extension AROM  - 10 x daily - 7 x weekly - 1 sets - 10 reps - 2 seconds hold - Standing Knee Flexion Stretch on Step  - 1 x daily - 7 x weekly - 3-5 sets - 3 reps - 10 seconds hold - Gastroc Stretch on Step  - 1-2 x daily - 7 x weekly - 1 sets - 3 reps - 30 seconds hold - Seated Table Hamstring Stretch  - 1-2 x daily - 7 x weekly - 1 sets - 3 reps - 30 seconds hold - Supine Quadriceps Stretch with Strap on Table  - 1-2 x daily - 7 x weekly - 1 sets - 3 reps - 30 seconds hold   ASSESSMENT:   CLINICAL IMPRESSION:  Active range of motion was 0 - 9 - 81, a bit stiffer than the last time I saw Damonica 1 day after her manipulation (0 - 7 - 98 that day).  Active range of motion remains the #1 priority and I encouraged Ameera to focus on this at home over the weekend multiple times per day (at least 5 x a day).  Continue active range of motion work to meet long-term goals.   OBJECTIVE IMPAIRMENTS: Abnormal gait, decreased activity tolerance, decreased endurance, decreased mobility, difficulty walking, decreased ROM, decreased strength, increased edema, impaired perceived functional ability, and pain.    ACTIVITY LIMITATIONS: bending, sitting, standing, squatting, sleeping, stairs, dressing, and locomotion level   PARTICIPATION LIMITATIONS: cleaning, driving, and community activity   PERSONAL FACTORS: OA, asthma, Bipolar, DDD L4-S1, headache, HTN, Lt leg neuropathy, obesity, pre-diabetes, plantar fascia surgery, bilateral knee surgeries, spinal fusion, bilateral carpal tunnel are also affecting patient's functional outcome.    REHAB POTENTIAL: Good   CLINICAL DECISION MAKING: Evolving/moderate  complexity   EVALUATION COMPLEXITY: Moderate     GOALS: Goals reviewed with patient? Yes   SHORT TERM GOALS: Target date: 07/14/2024 Mylan will be independent with her day 1 HEP Baseline: Started 06/16/2024 Goal status: Met 07/01/2024   2.  Improve right knee AROM to 0 - 5 - 95 degrees Baseline: 0 - 13 - 70 degrees Goal status:   Ongoing  07/01/2024   3.  Improve right quadriceps strength as assessed by functional self-report Baseline: 2+/5 Goal status: Ongoing  07/01/2024     LONG TERM GOALS: Target date: 08/11/2024   Improve Patient Specific Functional Score to 5 Baseline: 0.6 Goal status: Ongoing  06/28/2024   2.  Sagan will report right knee pain consistently 0-3/10 on the Visual Analog Scale Baseline: 7-10/10 Goal status: Ongoing  06/28/2024   3.  Improve right knee AROM to 0 - 2 - 110 degrees Baseline: 0 - 13 - 70 degrees Goal status: Ongoing  06/28/2024   4.  Improve right quadriceps strength to 4/5 MMT Baseline: 2+/5 MMT Goal status: Ongoing  06/28/2024   5.  Shaolin will be able to ambulate in the community without a noticeable limp due to her range deficit Baseline: Limited by a 13 degree extension deficit Goal status: Ongoing  06/28/2024   6.  Nevia will be independent with her long-term maintenance HEP at DC Baseline: Started 06/16/2024 Goal status: Ongoing  06/28/2024     PLAN:   PT FREQUENCY: 5 x a week for 2 weeks,  then 3 x a week for 6 weeks   PT DURATION: 8 weeks   PLANNED INTERVENTIONS: 97750- Physical Performance Testing, 97110-Therapeutic exercises, 97530- Therapeutic activity, 97112- Neuromuscular re-education, [phone]- Self Care, 02859- Manual therapy, [phone]- Gait training, [phone]- Electrical stimulation (unattended), 97016- Vasopneumatic device, 20560 (1-2 muscles), 20561 (3+ muscles)- Dry Needling, Patient/Family education, Balance training, Stair training, Joint mobilization, and Cryotherapy   PLAN FOR NEXT SESSION: Continue with manual therapy  & exercise for Post-manipulation so heavy passive, active-assisted and active range of motion emphasis     Myer LELON Ivory, PT, MPT 06/29/2024, 12:00 PM          Electronically signed by [name], PT at 06/29/2024 12:00 PM     Outpatient Rehab on 06/29/2024       Detailed Report      Note shared with patient Additional Documentation  Vitals: LMP 08/22/2016  Flowsheets: PT End of Session,   8 Minute Rule PT  Encounter Info: Billing Info,   History,   Allergies,   Detailed Report   Linked Episodes  R TKA manipulation Noted 06/16/2024 Orders Placed  None Medication Changes   None Medication List Visit Diagnoses   Difficulty in walking, not elsewhere classified  Muscle weakness (generalized)  Localized edema  Chronic pain of right knee  Stiffness of right knee, not elsewhere classified Problem List "

## 2024-07-01 NOTE — Therapy (Signed)
 "    Candi Grayce HERO, PT  Physical Therapist Specialty: Physical Therapy   Therapy    Signed   Encounter Date: 06/29/2024   Signed     Expand All Collapse All   OUTPATIENT PHYSICAL THERAPY LOWER EXTREMITY TREATMENT     Patient Name: [name] MRN: [id number] DOB:[birth year], 58 y.o., female Today's Date: 06/29/2024   END OF SESSION:   PT End of Session - 06/29/24 1007       Visit Number 10     Number of Visits 28     Date for Recertification  08/11/24     Authorization Type [employer] MEDICARE     Progress Note Due on Visit 19     PT Start Time 1006     PT Stop Time 1110     PT Time Calculation (min) 64 min     Activity Tolerance Patient tolerated treatment well     Behavior During Therapy WFL for tasks assessed/performed                          Past Medical History:  Diagnosis Date   Allergy     Anxiety     Arthritis     Asthma      History of Asthma   Bipolar disorder (HCC)     Chest pain, atypical 11/30/2012   Clotting disorder     Depression     GERD (gastroesophageal reflux disease)     H/O degenerative disc disease      L4-L5, L5-S1   Headache     Hyperglycemia      Postoperative hyperglycemia   Hypertension      hx of  not on meds in 7 years   Neuromuscular disorder (HCC)      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   Obesity 07/14/2012   Pneumonia     Polycystic disease, ovaries     Pre-diabetes     Reflux     Sinus tachycardia 07/14/2012    Pt says HR > 100 is consistent             Past Surgical History:  Procedure Laterality Date   COLONOSCOPY       FOOT SURGERY Left      Right plantar facsiitis scrape   KNEE CLOSED REDUCTION Right 06/16/2024    Procedure: MANIPULATION, KNEE, CLOSED;  Surgeon: Vernetta Lonni GRADE, MD;  Location: [facility];  Service: Orthopedics;  Laterality: Right;   KNEE SURGERY Bilateral 2007    Tendon release   KNEE SURGERY Right 04/05/2024    Total R Knee Replacement   LAPAROSCOPIC  GASTRIC SLEEVE RESECTION N/A 03/30/2017    Procedure: LAPAROSCOPIC GASTRIC SLEEVE RESECTION, UPPER ENDO;  Surgeon: Tanda Locus, MD;  Location: [facility];  Service: General;  Laterality: N/A;   NASAL SINUS SURGERY   2004   OVARIAN CYST REMOVAL   1998    A cyst on the fallopian tube removed   SPINAL FUSION       TONSILLECTOMY   1996   TOTAL KNEE ARTHROPLASTY Right 04/05/2024    Procedure: ARTHROPLASTY, KNEE, TOTAL;  Surgeon: Vernetta Lonni GRADE, MD;  Location: [facility];  Service: Orthopedics;  Laterality: Right;            Patient Active Problem List    Diagnosis Date Noted   Arthrofibrosis of knee joint, right 06/15/2024   Status post total right knee replacement 04/05/2024  Actinic keratosis 01/18/2024   Absolute anemia 01/18/2024   Pruritus 01/18/2024   Bilateral carpal tunnel syndrome 11/25/2023   History of sleeve gastrectomy 11/25/2023   Generalized anxiety disorder with panic attacks 08/12/2023   Primary insomnia 08/12/2023   Chronic constipation 08/12/2023   Contusion of nose 04/23/2023   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   Chronic migraine without aura, with intractable migraine, so stated, with status migrainosus 01/25/2023   History of psychiatric treatment 01/13/2023   Viral upper respiratory tract infection 03/28/2021   Episodic tension-type headache, not intractable 11/07/2020   Hypertrophy of inferior nasal turbinate 11/07/2020   Postnasal drip 11/07/2020   Lymphadenopathy, anterior cervical 07/13/2020   Sebaceous cyst 07/13/2020   Iron  deficiency anemia secondary to inadequate dietary iron  intake 01/12/2020   Constipation 12/13/2019   Hyperlipidemia LDL goal <130 09/08/2019   Positive colorectal cancer screening using Cologuard test 03/02/2018   Leukocytosis 01/26/2018   Onychomycosis of great toe 12/22/2017   Family history of DVT 03/20/2017   Unilateral primary osteoarthritis, right knee 05/13/2016   Dyslipidemia 03/04/2016   Right knee pain 03/03/2016    Pre-diabetes 11/01/2015   Allergic rhinitis 05/28/2015   Sleep state misperception 07/05/2014   BMI 33.0-33.9,adult 05/24/2013   Polycystic ovary disease 07/14/2012   Obstructive sleep apnea (adult) (pediatric) 02/04/2012   Perimenopausal 11/10/2011   Hot flashes 11/10/2011   Disturbance in sleep behavior 09/16/2011   Asthma 05/23/2011   Backache 05/23/2011   Bipolar disorder in partial remission 05/23/2011   Dysphagia 05/23/2011   Esophageal reflux 05/23/2011   Rhinitis, allergic 05/23/2011      PCP: Camie Moats, PA-C   REFERRING PROVIDER: Lonni CINDERELLA Poli, MD   REFERRING DIAG:  [phone] (ICD-10-CM) - Status post total right knee replacement  Post-manipulation this morning   THERAPY DIAG:  Difficulty in walking, not elsewhere classified   Muscle weakness (generalized)   Localized edema   Chronic pain of right knee   Stiffness of right knee, not elsewhere classified   Rationale for Evaluation and Treatment: Rehabilitation   ONSET DATE: Manipulation 06/16/2024, TKA 04/05/2024   SUBJECTIVE:    SUBJECTIVE STATEMENT: She saw Dr. Poli this morning who thinks no injuries from slip 3 days ago but needs to keep working her knee.    PERTINENT HISTORY: OA, asthma, Bipolar, DDD L4-S1, headache, HTN, Lt leg neuropathy, obesity, pre-diabetes, plantar fascia surgery, bilateral knee surgeries, spinal fusion, bilateral carpal tunnel   [name] had her right knee replaced 04/05/2024.  She only had home health PT due to difficulty getting to outpatient PT.  Her manipulation was this morning and she is motivated to fix her right knee.   PAIN:  Are you having pain?  Yes: NPRS scale: in last 2 days lowest 7/10 highest 10/10 Pain location: Rt knee Pain description: Stiff, achy Aggravating factors: Bending, walking, in and out of the car Relieving factors: Ice, ibuprofen, diludid, fednyl   PRECAUTIONS: Back   RED FLAGS: None       WEIGHT BEARING RESTRICTIONS: No   FALLS:   Has patient fallen in last 6 months? No   LIVING ENVIRONMENT: Lives with: lives with their family, lives with their spouse, and mon Lives in: House/apartment Stairs: Difficulty with stairs Has following equipment at home: None   OCCUPATION: Disbailty   PLOF: Needs assistance with ADLs   PATIENT GOALS: Bend the knee normally   NEXT MD VISIT: 06/29/2024   OBJECTIVE:  Note: Objective measures were completed at Evaluation unless otherwise noted.   DIAGNOSTIC FINDINGS: Right  knee arthroplasty without immediate postoperative complication.    PATIENT SURVEYS:  PSFS: THE PATIENT SPECIFIC FUNCTIONAL SCALE   Place score of 0-10 (0 = unable to perform activity and 10 = able to perform activity at the same level as before injury or problem)   Activity Date: 06/16/2024 06/28/24    ADLS including get in/out of a vehicle; clothes out of the dryer 1 x 3  6    2.  Sleep 0 x 2 1    3. Walk including on uneven ground, ramps, curbs   1    4. stairs   1    Total Score 0.06 2.25        Total Score = Sum of activity scores/number of activities   Minimally Detectable Change: 3 points (for single activity); 2 points (for average score)   [name] (nd). The Patient Specific Functional Scale . Retrieved from [email or web address]    COGNITION: Overall cognitive status: Within functional limits for tasks assessed                SENSATION: No new tingling or paresthesias   EDEMA:  Noted and not objectively assessed   LOWER EXTREMITY ROM:    AROM Left/Right  06/16/24 Right  06/17/24 Right  06/20/24 Right 06/23/24 Rt:  06/24/24 Rt  06/27/24 Right 06/28/24  Hip flexion                Hip extension                Hip abduction                Hip adduction                Hip internal rotation                Hip external rotation                Knee flexion 131/70 (Right 95 post exercises) 98 with PT assist Supine   80A 95 P Supine  84 A Supine AROM 84deg Supine AAROM 72  Seated P: 84*  Knee extension 0/-13 (Right -7 post-exercises) -7 active            Ankle dorsiflexion                Ankle plantarflexion                Ankle inversion                Ankle eversion                 (Blank rows = not tested)   LOWER EXTREMITY STRENGTH:   MMT Left/Right  06/16/24    Hip flexion      Hip extension      Hip abduction      Hip adduction      Hip internal rotation      Hip external rotation      Knee flexion      Knee extension 4/2+    Ankle dorsiflexion      Ankle plantarflexion      Ankle inversion      Ankle eversion       (Blank rows = not tested)   GAIT: Distance walked: 50 feet Assistive device utilized: None Level of assistance: Complete Independence Comments: Ragena has limited WB endurance and walks with a very stiff gait, unable to fully extend her right knee  TREATMENT DATE: R TKA 06/29/2024 TherEx:  UBE with BLEs & BUEs seat 12 level 1 with flexion stretch 2 reps, full revolutions backwards 2 min and full revolutions forward for 6 minutes Quad stretch with RLE over edge of mat & knee flex with strap 30 sec hold 3 reps Hamstring stretch seated with RLE long sit & strap assist along with maintaining pressure on knee to prevent flexing 30 sec hold 3 reps Standing right knee flexion stretch with foot edge of chair against wall 10 sec hold 3 reps 3 sets moving stance foot closer each set to increase stretch.     Gait Training: Pt amb 100' x 2 with cane working on carry over with verbal cues on gait pattern with terminal stance / swing flexion and weight acceptance / stance ext and upright trunk.    TherAct:  PT demo & verbal cues on use of 24 bar stool for standing activities to build tolerance with partial weight on RLE and sit to stand without UE support & sitting down with light touch for safety.  Pt verbalized and return demo understanding.  Step up and over with 6  step leading with Rt LE up and Lt LE down 2x8 with bilat UE support // bars. PT cues on technique.    Manual:  Seated knee flexion with IR/distraction 1x10 with 10s hold and PT providing PROM knee extension with distraction for 10s between each set with contract-relax Instrument assisted Soft tissue & scar mobs and suction cupping Grade IV mobs for ext   Vaso:  Rt LE elevated on wedge for 10 minutes with medium compression at 34deg        TREATMENT DATE: R TKA 06/28/2024 TherEx:  UBE with BLEs & BUEs seat 12 level 1 with flexion stretch 5 reps, full revolutions backwards 3 min and full revolutions for 4 minutes   Gait Training: PT demo & verbal cues on gait pattern with terminal stance / swing flexion and weight acceptance / stance ext. Pt amb 20' & 79' with cane working on carry over with verbal & tactile cues.    TherAct:  Leg press BLEs 87# 15 reps; RLE only 37# 10 reps   Manual:  Seated knee flexion with IR/distraction 1x10 with 10s hold and PT providing PROM knee extension with distraction for 10s between each set with contract-relax Instrument assisted Soft tissue & scar mobs and suction cupping   Vaso:  Rt LE elevated on wedge for 10 minutes with medium compression at 34deg      TREATMENT DATE: R TKA 06/27/2024 TherEx:  UBE with bilat LE only seat 12 level 2 for 8 minutes partial revolutions progressing to full revolutions Level 0-2         Seated LAQ with 3# ankle weight  2 x 10  TherAct:  Sit to stand  2 x 10  Seated SLR: 2 x 10  Manual:  Ice massage to help break up scar tissue IASTM to pt's Rt knee Vaso:  Rt LE elevated on wedge for 10 minutes with medium compression at 34deg        06/24/2024 TherEx:  UBE with bilat LE only seat 12 level 2 for 8 minutes switching between fwd and backward revolutions        Patient performed weight shifts onto Lt LE before ambulating to next exercise  Knee flexion stretch with chair against wall (BUE support on chair  backs) 3x10s holds then moved Lt LE 1-2 closer and performed 3x10s Seated  LAQ with 2# ankle weight    TherAct:  Step up and over with 6 step leading with Rt LE up and Lt LE down 2x8 with bilat UE support // bars  Bilat leg press 2x10 with 81#   Vaso:  Rt LE elevated on wedge for 10 minutes with medium compression at 34deg      HOME EXERCISE PROGRAM: Access Code: [email or web address] URL: [email or web address] Date: 06/29/2024 Prepared by: Grayce Spatz   Exercises - Seated Knee Flexion AAROM  - 10 x daily - 7 x weekly - 1 sets - 10 reps - 5 seconds hold - Supine Heel Slide with Strap  - 10 x daily - 7 x weekly - 1 sets - 10 reps - 5 seconds hold - Supine Quadricep Sets  - 10 x daily - 7 x weekly - 2 sets - 10 reps - 5 second hold - Seated Knee Extension AROM  - 10 x daily - 7 x weekly - 1 sets - 10 reps - 2 seconds hold - Standing Knee Flexion Stretch on Step  - 1 x daily - 7 x weekly - 3-5 sets - 3 reps - 10 seconds hold - Gastroc Stretch on Step  - 1-2 x daily - 7 x weekly - 1 sets - 3 reps - 30 seconds hold - Seated Table Hamstring Stretch  - 1-2 x daily - 7 x weekly - 1 sets - 3 reps - 30 seconds hold - Supine Quadriceps Stretch with Strap on Table  - 1-2 x daily - 7 x weekly - 1 sets - 3 reps - 30 seconds hold   ASSESSMENT:   CLINICAL IMPRESSION:  Patient improved gait mechanics but has to focus with slow gait.  She appears to benefit from muscle stretches.   Patient responds to progressive exercises and manual therapy during session but continues to arrive with stiffness in her knee.  PT is recommending continued skilled PT.    OBJECTIVE IMPAIRMENTS: Abnormal gait, decreased activity tolerance, decreased endurance, decreased mobility, difficulty walking, decreased ROM, decreased strength, increased edema, impaired perceived functional ability, and pain.    ACTIVITY LIMITATIONS: bending, sitting, standing, squatting, sleeping, stairs, dressing, and locomotion level    PARTICIPATION LIMITATIONS: cleaning, driving, and community activity   PERSONAL FACTORS: OA, asthma, Bipolar, DDD L4-S1, headache, HTN, Lt leg neuropathy, obesity, pre-diabetes, plantar fascia surgery, bilateral knee surgeries, spinal fusion, bilateral carpal tunnel are also affecting patient's functional outcome.    REHAB POTENTIAL: Good   CLINICAL DECISION MAKING: Evolving/moderate complexity   EVALUATION COMPLEXITY: Moderate     GOALS: Goals reviewed with patient? Yes   SHORT TERM GOALS: Target date: 07/14/2024 [name] will be independent with her day 1 HEP Baseline: Started 06/16/2024 Goal status: Ongoing  06/28/2024   2.  Improve right knee AROM to 0 - 5 - 95 degrees Baseline: 0 - 13 - 70 degrees Goal status:   Ongoing  06/28/2024   3.  Improve right quadriceps strength as assessed by functional self-report Baseline: 2+/5 Goal status: Ongoing  06/28/2024     LONG TERM GOALS: Target date: 08/11/2024   Improve Patient Specific Functional Score to 5 Baseline: 0.6 Goal status: Ongoing  06/28/2024   2.  [name] will report right knee pain consistently 0-3/10 on the Visual Analog Scale Baseline: 7-10/10 Goal status: Ongoing  06/28/2024   3.  Improve right knee AROM to 0 - 2 - 110 degrees Baseline: 0 - 13 - 70 degrees Goal status: Ongoing  06/28/2024   4.  Improve right quadriceps strength to 4/5 MMT Baseline: 2+/5 MMT Goal status: Ongoing  06/28/2024   5.  [name] will be able to ambulate in the community without a noticeable limp due to her range deficit Baseline: Limited by a 13 degree extension deficit Goal status: Ongoing  06/28/2024   6.  [name] will be independent with her long-term maintenance HEP at DC Baseline: Started 06/16/2024 Goal status: Ongoing  06/28/2024     PLAN:   PT FREQUENCY: 5 x a week for 2 weeks, then 3 x a week for 6 weeks   PT DURATION: 8 weeks   PLANNED INTERVENTIONS: 97750- Physical Performance Testing, 97110-Therapeutic exercises, 97530-  Therapeutic activity, 97112- Neuromuscular re-education, 97535- Self Care, 02859- Manual therapy, [phone]- Gait training, [phone]- Electrical stimulation (unattended), 97016- Vasopneumatic device, 20560 (1-2 muscles), 20561 (3+ muscles)- Dry Needling, Patient/Family education, Balance training, Stair training, Joint mobilization, and Cryotherapy   PLAN FOR NEXT SESSION: continue with manual therapy & exercise for Post-manipulation so heavy passive, active-assisted and active range of motion emphasis       Grayce Spatz, PT, DPT 06/29/2024, 12:00 PM          Electronically signed by Spatz Grayce HERO, PT at 06/29/2024 12:00 PM     Outpatient Rehab on 06/29/2024       Detailed Report      Note shared with patient Additional Documentation  Vitals: LMP 08/22/2016  Flowsheets: PT End of Session,   8 Minute Rule PT  Encounter Info: Billing Info,   History,   Allergies,   Detailed Report   Linked Episodes  R TKA manipulation Noted 06/16/2024 Orders Placed  None Medication Changes   None Medication List Visit Diagnoses   Difficulty in walking, not elsewhere classified  Muscle weakness (generalized)  Localized edema  Chronic pain of right knee  Stiffness of right knee, not elsewhere classified Problem List "

## 2024-07-04 ENCOUNTER — Other Ambulatory Visit: Payer: Self-pay | Admitting: Physician Assistant

## 2024-07-04 ENCOUNTER — Telehealth: Payer: Self-pay

## 2024-07-04 ENCOUNTER — Ambulatory Visit

## 2024-07-04 ENCOUNTER — Encounter

## 2024-07-04 NOTE — Telephone Encounter (Signed)
 PT called patient as she was a no-show for her appointment this morning at 0800. Patient answered call and reported that she thought her appointment was at 0900 and she hadn't slept all night. PT reminded patient that she has an appointment tomorrow 07/05/2024 at 1600.  Susannah Daring, PT, DPT 07/04/2024 8:25 AM

## 2024-07-05 ENCOUNTER — Encounter: Admitting: Rehabilitative and Restorative Service Providers"

## 2024-07-06 ENCOUNTER — Ambulatory Visit

## 2024-07-06 DIAGNOSIS — R262 Difficulty in walking, not elsewhere classified: Secondary | ICD-10-CM | POA: Diagnosis not present

## 2024-07-06 DIAGNOSIS — M6281 Muscle weakness (generalized): Secondary | ICD-10-CM | POA: Diagnosis not present

## 2024-07-06 DIAGNOSIS — M25561 Pain in right knee: Secondary | ICD-10-CM | POA: Diagnosis not present

## 2024-07-06 DIAGNOSIS — G8929 Other chronic pain: Secondary | ICD-10-CM | POA: Diagnosis not present

## 2024-07-06 DIAGNOSIS — R600 Localized edema: Secondary | ICD-10-CM

## 2024-07-06 DIAGNOSIS — M25661 Stiffness of right knee, not elsewhere classified: Secondary | ICD-10-CM

## 2024-07-06 NOTE — Therapy (Signed)
 " OUTPATIENT PHYSICAL THERAPY LOWER EXTREMITY TREATMENT   Patient Name: [name] MRN: [id number] DOB:1966-06-19, 58 y.o., female Today's Date: 07/06/2024  END OF SESSION:  PT End of Session - 07/06/24 1554     Visit Number 12    Number of Visits 28    Date for Recertification  08/11/24    Authorization Type [employer] MEDICARE    PT Start Time 1600    PT Stop Time 1648    PT Time Calculation (min) 48 min    Activity Tolerance Patient tolerated treatment well    Behavior During Therapy WFL for tasks assessed/performed           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   Obesity 07/14/2012   Pneumonia    Polycystic disease, ovaries    Pre-diabetes    Reflux    Sinus tachycardia 07/14/2012   Pt says HR > 100 is consistent   Past Surgical History:  Procedure Laterality Date   COLONOSCOPY     FOOT SURGERY Left    Right plantar facsiitis scrape   KNEE CLOSED REDUCTION Right 06/16/2024   Procedure: MANIPULATION, KNEE, CLOSED;  Surgeon: Vernetta Lonni GRADE, MD;  Location: [facility];  Service: Orthopedics;  Laterality: Right;   KNEE SURGERY Bilateral 2007   Tendon release   KNEE SURGERY Right 04/05/2024   Total R Knee Replacement   LAPAROSCOPIC GASTRIC SLEEVE RESECTION N/A 03/30/2017   Procedure: LAPAROSCOPIC GASTRIC SLEEVE RESECTION, UPPER ENDO;  Surgeon: Tanda Locus, MD;  Location: [facility];  Service: General;  Laterality: N/A;   NASAL SINUS SURGERY  2004   OVARIAN CYST REMOVAL  1998   A cyst on the fallopian tube removed    SPINAL FUSION     TONSILLECTOMY  1996   TOTAL KNEE ARTHROPLASTY Right 04/05/2024   Procedure: ARTHROPLASTY, KNEE, TOTAL;  Surgeon: Vernetta Lonni GRADE, MD;  Location: [facility];  Service: Orthopedics;  Laterality: Right;   Patient Active Problem List   Diagnosis Date Noted   Arthrofibrosis of knee joint, right 06/15/2024   Status post total right knee replacement 04/05/2024   Actinic keratosis 01/18/2024   Absolute anemia 01/18/2024   Pruritus 01/18/2024   Bilateral carpal tunnel syndrome 11/25/2023   History of sleeve gastrectomy 11/25/2023   Generalized anxiety disorder with panic attacks 08/12/2023   Primary insomnia 08/12/2023   Chronic constipation 08/12/2023   Contusion of nose 04/23/2023   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   Chronic migraine without aura, with intractable migraine, so stated, with status migrainosus 01/25/2023   History of psychiatric treatment 01/13/2023   Viral upper respiratory tract infection 03/28/2021   Episodic tension-type headache, not intractable 11/07/2020   Hypertrophy of inferior nasal turbinate 11/07/2020   Postnasal drip 11/07/2020   Lymphadenopathy, anterior cervical 07/13/2020   Sebaceous cyst 07/13/2020   Iron  deficiency anemia secondary to inadequate dietary iron  intake 01/12/2020   Constipation 12/13/2019  Hyperlipidemia LDL goal <130 09/08/2019   Positive colorectal cancer screening using Cologuard test 03/02/2018   Leukocytosis 01/26/2018   Onychomycosis of great toe 12/22/2017   Family history of DVT 03/20/2017   Unilateral primary osteoarthritis, right knee 05/13/2016   Dyslipidemia 03/04/2016   Right knee pain 03/03/2016   Pre-diabetes 11/01/2015   Allergic rhinitis 05/28/2015   Sleep state misperception 07/05/2014   BMI 33.0-33.9,adult 05/24/2013   Polycystic ovary disease 07/14/2012   Obstructive sleep apnea (adult) (pediatric) 02/04/2012   Perimenopausal 11/10/2011   Hot flashes 11/10/2011   Disturbance in sleep behavior  09/16/2011   Asthma 05/23/2011   Backache 05/23/2011   Bipolar disorder in partial remission 05/23/2011   Dysphagia 05/23/2011   Esophageal reflux 05/23/2011   Rhinitis, allergic 05/23/2011    PCP: Camie Moats, PA-C  REFERRING PROVIDER: Lonni CINDERELLA Poli, MD  REFERRING DIAG:  [phone] (ICD-10-CM) - Status post total right knee replacement  Post-manipulation this morning  THERAPY DIAG:  Difficulty in walking, not elsewhere classified  Muscle weakness (generalized)  Localized edema  Chronic pain of right knee  Stiffness of right knee, not elsewhere classified  Rationale for Evaluation and Treatment: Rehabilitation  ONSET DATE: Manipulation 06/16/2024, TKA 04/05/2024  SUBJECTIVE:   SUBJECTIVE STATEMENT:Pt states it feels tight and swollen.    PERTINENT HISTORY: OA, asthma, Bipolar, DDD L4-S1, headache, HTN, Lt leg neuropathy, obesity, pre-diabetes, plantar fascia surgery, bilateral knee surgeries, spinal fusion, bilateral carpal tunnel  Sheng had her right knee replaced 04/05/2024.  She only had home health PT due to difficulty getting to outpatient PT.  Her manipulation was this morning and she is motivated to fix her right knee.  PAIN:  Are you having pain?  Yes: NPRS scale: 8/10 Pain location: Rt knee Pain description: Stiff, achy Aggravating factors: Bending, walking, in and out of the car Relieving factors: Ice, ibuprofen, diludid, fednyl  PRECAUTIONS: Back  RED FLAGS: None   WEIGHT BEARING RESTRICTIONS: No  FALLS:  Has patient fallen in last 6 months? No  LIVING ENVIRONMENT: Lives with: lives with their family, lives with their spouse, and mon Lives in: House/apartment Stairs: Difficulty with stairs Has following equipment at home: None  OCCUPATION: Disbailty  PLOF: Needs assistance with ADLs  PATIENT GOALS: Bend the knee normally  NEXT MD VISIT: 06/29/2024  OBJECTIVE:  Note: Objective measures were completed at Evaluation unless  otherwise noted.  DIAGNOSTIC FINDINGS: Right knee arthroplasty without immediate postoperative complication.   PATIENT SURVEYS:  PSFS: THE PATIENT SPECIFIC FUNCTIONAL SCALE  Place score of 0-10 (0 = unable to perform activity and 10 = able to perform activity at the same level as before injury or problem)  Activity Date: 06/16/2024 06/28/24   ADLS including get in/out of a vehicle; clothes out of the dryer 1 x 3  6   2.  Sleep 0 x 2 1   3. Walk including on uneven ground, ramps, curbs  1   4. stairs  1   Total Score 0.06 2.25     Total Score = Sum of activity scores/number of activities  Minimally Detectable Change: 3 points (for single activity); 2 points (for average score)  [facility] (nd). The Patient Specific Functional Scale . Retrieved from [email or web address]   COGNITION: Overall cognitive status: Within functional limits for tasks assessed    SENSATION: No new tingling or paresthesias  EDEMA:  Noted and not objectively assessed  LOWER EXTREMITY ROM:   AROM Left/Right  06/16/24 Right  06/17/24 Right  06/20/24  Right 06/23/24 Rt:  06/24/24 Rt  06/27/24 Right 06/28/24 Right 07/06/24   Hip flexion          Hip extension          Hip abduction          Hip adduction          Hip internal rotation          Hip external rotation          Knee flexion 131/70 (Right 95 post exercises) 98 with PT assist Supine  80A 95 P Supine  84 A Supine AROM 84deg Supine AAROM 72  Seated P: 84* Supine 74A  Knee extension 0/-13 (Right -7 post-exercises) -7 active        Ankle dorsiflexion          Ankle plantarflexion          Ankle inversion          Ankle eversion           (Blank rows = not tested)  LOWER EXTREMITY STRENGTH:  MMT Left/Right  06/16/24   Hip flexion    Hip extension    Hip abduction    Hip adduction    Hip internal rotation    Hip external rotation    Knee flexion    Knee  extension 4/2+   Ankle dorsiflexion    Ankle plantarflexion    Ankle inversion    Ankle eversion     (Blank rows = not tested)  GAIT: Distance walked: 50 feet Assistive device utilized: None Level of assistance: Complete Independence Comments: Khadeejah has limited WB endurance and walks with a very stiff gait, unable to fully extend her right knee    TREATMENT DATE: R TKA 07/06/24 TherEx:  Sci fit UE/LE seat 12 full revolutions 7 min Knee flexion lean ins on 8  box then on chair 10x each Mini squats 20x Leg Press 3x10 #87 Seated knee flexion with IR/distraction 10x Vaso 10 min 34 degree med compression  06/29/2024 TherEx:  UBE with BLEs & BUEs seat 12 level 1 with flexion stretch 2 reps, full revolutions backwards 2 min and full revolutions forward for 6 minutes Quad stretch with RLE over edge of mat & knee flex with strap 30 sec hold 3 reps Hamstring stretch seated with RLE long sit & strap assist along with maintaining pressure on knee to prevent flexing 30 sec hold 3 reps Standing right knee flexion stretch with foot edge of chair against wall 10 sec hold 3 reps 3 sets moving stance foot closer each set to increase stretch.    Gait Training: Pt amb 100' x 2 with cane working on carry over with verbal cues on gait pattern with terminal stance / swing flexion and weight acceptance / stance ext and upright trunk.   TherAct:  PT demo & verbal cues on use of 24 bar stool for standing activities to build tolerance with partial weight on RLE and sit to stand without UE support & sitting down with light touch for safety.  Pt verbalized and return demo understanding.  Step up and over with 6 step leading with Rt LE up and Lt LE down 2x8 with bilat UE support // bars. PT cues on technique.   Manual:  Seated knee flexion with IR/distraction 1x10 with 10s hold and PT providing PROM knee extension with distraction for 10s between each set with contract-relax Instrument assisted Soft  tissue & scar mobs and suction cupping Grade  IV mobs for ext  Vaso:  Rt LE elevated on wedge for 10 minutes with medium compression at 34deg     TREATMENT DATE: R TKA 06/28/2024 TherEx:  UBE with BLEs & BUEs seat 12 level 1 with flexion stretch 5 reps, full revolutions backwards 3 min and full revolutions for 4 minutes  Gait Training: PT demo & verbal cues on gait pattern with terminal stance / swing flexion and weight acceptance / stance ext. Pt amb 20' & 89' with cane working on carry over with verbal & tactile cues.   TherAct:  Leg press BLEs 87# 15 reps; RLE only 37# 10 reps  Manual:  Seated knee flexion with IR/distraction 1x10 with 10s hold and PT providing PROM knee extension with distraction for 10s between each set with contract-relax Instrument assisted Soft tissue & scar mobs and suction cupping  Vaso:  Rt LE elevated on wedge for 10 minutes with medium compression at 34deg   HOME EXERCISE PROGRAM: Access Code: [email or web address] URL: [email or web address] Date: 06/29/2024 Prepared by: Grayce Spatz  Exercises - Seated Knee Flexion AAROM  - 10 x daily - 7 x weekly - 1 sets - 10 reps - 5 seconds hold - Supine Heel Slide with Strap  - 10 x daily - 7 x weekly - 1 sets - 10 reps - 5 seconds hold - Supine Quadricep Sets  - 10 x daily - 7 x weekly - 2 sets - 10 reps - 5 second hold - Seated Knee Extension AROM  - 10 x daily - 7 x weekly - 1 sets - 10 reps - 2 seconds hold - Standing Knee Flexion Stretch on Step  - 1 x daily - 7 x weekly - 3-5 sets - 3 reps - 10 seconds hold - Gastroc Stretch on Step  - 1-2 x daily - 7 x weekly - 1 sets - 3 reps - 30 seconds hold - Seated Table Hamstring Stretch  - 1-2 x daily - 7 x weekly - 1 sets - 3 reps - 30 seconds hold - Supine Quadriceps Stretch with Strap on Table  - 1-2 x daily - 7 x weekly - 1 sets - 3 reps - 30 seconds hold  ASSESSMENT:  CLINICAL IMPRESSION:  Patient  continues to be limited by pain .  Flexion mobility  not increasing.    OBJECTIVE IMPAIRMENTS: Abnormal gait, decreased activity tolerance, decreased endurance, decreased mobility, difficulty walking, decreased ROM, decreased strength, increased edema, impaired perceived functional ability, and pain.   ACTIVITY LIMITATIONS: bending, sitting, standing, squatting, sleeping, stairs, dressing, and locomotion level  PARTICIPATION LIMITATIONS: cleaning, driving, and community activity  PERSONAL FACTORS: OA, asthma, Bipolar, DDD L4-S1, headache, HTN, Lt leg neuropathy, obesity, pre-diabetes, plantar fascia surgery, bilateral knee surgeries, spinal fusion, bilateral carpal tunnel are also affecting patient's functional outcome.   REHAB POTENTIAL: Good  CLINICAL DECISION MAKING: Evolving/moderate complexity  EVALUATION COMPLEXITY: Moderate   GOALS: Goals reviewed with patient? Yes  SHORT TERM GOALS: Target date: 07/14/2024 Meggen will be independent with her day 1 HEP Baseline: Started 06/16/2024 Goal status: Ongoing  06/28/2024  2.  Improve right knee AROM to 0 - 5 - 95 degrees Baseline: 0 - 13 - 70 degrees Goal status:   Ongoing  06/28/2024  3.  Improve right quadriceps strength as assessed by functional self-report Baseline: 2+/5 Goal status: Ongoing  06/28/2024   LONG TERM GOALS: Target date: 08/11/2024  Improve Patient Specific Functional Score to 5 Baseline: 0.6 Goal status: Ongoing  06/28/2024  2.  Hopie will report right knee pain consistently 0-3/10 on the Visual Analog Scale Baseline: 7-10/10 Goal status: Ongoing  06/28/2024  3.  Improve right knee AROM to 0 - 2 - 110 degrees Baseline: 0 - 13 - 70 degrees Goal status: Ongoing  06/28/2024  4.  Improve right quadriceps strength to 4/5 MMT Baseline: 2+/5 MMT Goal status: Ongoing  06/28/2024  5.  Joycelynn will be able to ambulate in the community without a noticeable limp due to her range deficit Baseline: Limited by a 13 degree extension deficit Goal status: Ongoing   06/28/2024  6.  Ermie will be independent with her long-term maintenance HEP at DC Baseline: Started 06/16/2024 Goal status: Ongoing  06/28/2024   PLAN:  PT FREQUENCY: 5 x a week for 2 weeks, then 3 x a week for 6 weeks  PT DURATION: 8 weeks  PLANNED INTERVENTIONS: 97750- Physical Performance Testing, 97110-Therapeutic exercises, 97530- Therapeutic activity, 97112- Neuromuscular re-education, 97535- Self Care, 02859- Manual therapy, [phone]- Gait training, [phone]- Electrical stimulation (unattended), 97016- Vasopneumatic device, 20560 (1-2 muscles), 20561 (3+ muscles)- Dry Needling, Patient/Family education, Balance training, Stair training, Joint mobilization, and Cryotherapy  PLAN FOR NEXT SESSION: continue with manual therapy & exercise for Post-manipulation so heavy passive, active-assisted and active range of motion emphasis    Burnard CHRISTELLA Meth, PT,  07/06/2024, 4:45 PM  "

## 2024-07-08 ENCOUNTER — Ambulatory Visit: Payer: Medicare (Managed Care) | Admitting: Rehabilitative and Restorative Service Providers"

## 2024-07-08 ENCOUNTER — Encounter: Payer: Self-pay | Admitting: Hematology and Oncology

## 2024-07-08 ENCOUNTER — Encounter: Payer: Self-pay | Admitting: Rehabilitative and Restorative Service Providers"

## 2024-07-08 DIAGNOSIS — R600 Localized edema: Secondary | ICD-10-CM | POA: Diagnosis not present

## 2024-07-08 DIAGNOSIS — R262 Difficulty in walking, not elsewhere classified: Secondary | ICD-10-CM | POA: Diagnosis not present

## 2024-07-08 DIAGNOSIS — G8929 Other chronic pain: Secondary | ICD-10-CM | POA: Diagnosis not present

## 2024-07-08 DIAGNOSIS — M25661 Stiffness of right knee, not elsewhere classified: Secondary | ICD-10-CM | POA: Diagnosis not present

## 2024-07-08 DIAGNOSIS — M25561 Pain in right knee: Secondary | ICD-10-CM

## 2024-07-08 DIAGNOSIS — M6281 Muscle weakness (generalized): Secondary | ICD-10-CM | POA: Diagnosis not present

## 2024-07-08 NOTE — Therapy (Signed)
 " OUTPATIENT PHYSICAL THERAPY LOWER EXTREMITY TREATMENT   Patient Name: [name] MRN: [id number] DOB:08-02-1965, 59 y.o., female Today's Date: 07/08/2024  END OF SESSION:  PT End of Session - 07/08/24 1428     Visit Number 13    Number of Visits 28    Date for Recertification  08/11/24    Authorization Type [employer] MEDICARE    PT Start Time 1428    PT Stop Time 1524    PT Time Calculation (min) 56 min    Activity Tolerance Patient tolerated treatment well;No increased pain;Patient limited by pain    Behavior During Therapy [facility] for tasks assessed/performed            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   Obesity 07/14/2012   Pneumonia    Polycystic disease, ovaries    Pre-diabetes    Reflux    Sinus tachycardia 07/14/2012   Pt says HR > 100 is consistent   Past Surgical History:  Procedure Laterality Date   COLONOSCOPY     FOOT SURGERY Left    Right plantar facsiitis scrape   KNEE CLOSED REDUCTION Right 06/16/2024   Procedure: MANIPULATION, KNEE, CLOSED;  Surgeon: Vernetta Lonni GRADE, MD;  Location: [facility];  Service: Orthopedics;  Laterality: Right;   KNEE SURGERY Bilateral 2007   Tendon release   KNEE SURGERY Right 04/05/2024   Total R Knee Replacement   LAPAROSCOPIC GASTRIC SLEEVE RESECTION N/A 03/30/2017   Procedure: LAPAROSCOPIC GASTRIC SLEEVE RESECTION, UPPER ENDO;  Surgeon: Tanda Locus, MD;  Location: [facility];  Service: General;  Laterality: N/A;   NASAL SINUS SURGERY  2004   OVARIAN CYST REMOVAL  1998    A cyst on the fallopian tube removed   SPINAL FUSION     TONSILLECTOMY  1996   TOTAL KNEE ARTHROPLASTY Right 04/05/2024   Procedure: ARTHROPLASTY, KNEE, TOTAL;  Surgeon: Vernetta Lonni GRADE, MD;  Location: [facility];  Service: Orthopedics;  Laterality: Right;   Patient Active Problem List   Diagnosis Date Noted   Arthrofibrosis of knee joint, right 06/15/2024   Status post total right knee replacement 04/05/2024   Actinic keratosis 01/18/2024   Absolute anemia 01/18/2024   Pruritus 01/18/2024   Bilateral carpal tunnel syndrome 11/25/2023   History of sleeve gastrectomy 11/25/2023   Generalized anxiety disorder with panic attacks 08/12/2023   Primary insomnia 08/12/2023   Chronic constipation 08/12/2023   Contusion of nose 04/23/2023   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   Chronic migraine without aura, with intractable migraine, so stated, with status migrainosus 01/25/2023   History of psychiatric treatment 01/13/2023   Viral upper respiratory tract infection 03/28/2021   Episodic tension-type headache, not intractable 11/07/2020   Hypertrophy of inferior nasal turbinate 11/07/2020   Postnasal drip 11/07/2020   Lymphadenopathy, anterior cervical 07/13/2020   Sebaceous cyst 07/13/2020   Iron  deficiency anemia secondary to inadequate dietary iron  intake  01/12/2020   Constipation 12/13/2019   Hyperlipidemia LDL goal <130 09/08/2019   Positive colorectal cancer screening using Cologuard test 03/02/2018   Leukocytosis 01/26/2018   Onychomycosis of great toe 12/22/2017   Family history of DVT 03/20/2017   Unilateral primary osteoarthritis, right knee 05/13/2016   Dyslipidemia 03/04/2016   Right knee pain 03/03/2016   Pre-diabetes 11/01/2015   Allergic rhinitis 05/28/2015   Sleep state misperception 07/05/2014   BMI 33.0-33.9,adult 05/24/2013   Polycystic ovary disease 07/14/2012   Obstructive sleep apnea (adult) (pediatric) 02/04/2012   Perimenopausal 11/10/2011   Hot flashes  11/10/2011   Disturbance in sleep behavior 09/16/2011   Asthma 05/23/2011   Backache 05/23/2011   Bipolar disorder in partial remission 05/23/2011   Dysphagia 05/23/2011   Esophageal reflux 05/23/2011   Rhinitis, allergic 05/23/2011    PCP: Camie Moats, PA-C  REFERRING PROVIDER: Lonni CINDERELLA Poli, MD  REFERRING DIAG:  [phone] (ICD-10-CM) - Status post total right knee replacement  Post-manipulation this morning  THERAPY DIAG:  Difficulty in walking, not elsewhere classified  Muscle weakness (generalized)  Localized edema  Chronic pain of right knee  Stiffness of right knee, not elsewhere classified  Rationale for Evaluation and Treatment: Rehabilitation  ONSET DATE: Manipulation 06/16/2024, TKA 04/05/2024  SUBJECTIVE:   SUBJECTIVE STATEMENT: Talicia reports she is pain-limited with her HEP.  She wants to do more but has a hard time getting past the pain.  PERTINENT HISTORY: OA, asthma, Bipolar, DDD L4-S1, headache, HTN, Lt leg neuropathy, obesity, pre-diabetes, plantar fascia surgery, bilateral knee surgeries, spinal fusion, bilateral carpal tunnel  [name] had her right knee replaced 04/05/2024.  She only had home health PT due to difficulty getting to outpatient PT.  Her manipulation was this morning and she is motivated to fix her right knee.  PAIN:  Are you having pain?  Yes: NPRS scale: 5-8/10 this week Pain location: Rt knee Pain description: Stiff, achy Aggravating factors: Bending, walking, in and out of the car Relieving factors: Ice, meds PRECAUTIONS: Back  RED FLAGS: None   WEIGHT BEARING RESTRICTIONS: No  FALLS:  Has patient fallen in last 6 months? No  LIVING ENVIRONMENT: Lives with: lives with their family, lives with their spouse, and mom Lives in: House/apartment Stairs: Difficulty with stairs Has following equipment at home: None  OCCUPATION: Disbailty  PLOF: Needs assistance with ADLs  PATIENT GOALS: Bend the knee normally  NEXT  MD VISIT: 06/29/2024  OBJECTIVE:  Note: Objective measures were completed at Evaluation unless otherwise noted.  DIAGNOSTIC FINDINGS: Right knee arthroplasty without immediate postoperative complication.   PATIENT SURVEYS:  PSFS: THE PATIENT SPECIFIC FUNCTIONAL SCALE  Place score of 0-10 (0 = unable to perform activity and 10 = able to perform activity at the same level as before injury or problem)  Activity Date: 06/16/2024 06/28/24   ADLS including get in/out of a vehicle; clothes out of the dryer 1 x 3  6   2.  Sleep 0 x 2 1   3. Walk including on uneven ground, ramps, curbs  1   4. stairs  1   Total Score 0.06 2.25     Total Score = Sum of activity scores/number of activities  Minimally Detectable Change: 3 points (for single activity); 2 points (for average score)  [facility] (nd). The Patient Specific Functional Scale . Retrieved from [email or web address].[email or web address]   COGNITION: Overall cognitive status: Within functional limits for tasks assessed    SENSATION: No new tingling or paresthesias  EDEMA:  Noted and not objectively assessed  LOWER EXTREMITY ROM:   AROM Left/Right  06/16/24 Right  06/17/24 Right  06/20/24 Right 06/23/24 Rt:  06/24/24 Rt  06/27/24 Right 06/28/24 Right 07/06/24  Right 07/08/2024  Hip flexion           Hip extension           Hip abduction           Hip adduction           Hip internal rotation           Hip external rotation           Knee flexion 131/70 (Right 95 post exercises) 98 with PT assist Supine  80A 95 P Supine  84 A Supine AROM 84deg Supine AAROM 72  Seated P: 84* Supine 74A Passive 82  Knee extension 0/-13 (Right -7 post-exercises) -7 active       -5 active  Ankle dorsiflexion           Ankle plantarflexion           Ankle inversion           Ankle eversion            (Blank rows = not tested)  LOWER EXTREMITY STRENGTH:  MMT Left/Right  06/16/24    Hip flexion    Hip extension    Hip abduction    Hip adduction    Hip internal rotation    Hip external rotation    Knee flexion    Knee extension 4/2+   Ankle dorsiflexion    Ankle plantarflexion    Ankle inversion    Ankle eversion     (Blank rows = not tested)  GAIT: Distance walked: 50 feet Assistive device utilized: None Level of assistance: Complete Independence Comments: Laterrica has limited WB endurance and walks with a very stiff gait, unable to fully extend her right knee    TREATMENT DATE: R TKA 07/08/2024 Recumbent bike Seat 7 for 5 minutes active assisted range of motion Seated knee flexion AAROM (left pushes right into flexion) 4 sets of 5 x 10 seconds (3 sets with PT assist) Quad sets with right heel prop 10 x 5 seconds  Functional Activities: Double Leg Press 100# 25 reps for 5 seconds at the bend, full extension Single Leg Press 50# 10 reps for 5 seconds at the bend, full extension   Vaso Right Knee 10 minutes High 34* 10 minutes   07/06/24 TherEx:  Sci fit UE/LE seat 12 full revolutions 7 min Knee flexion lean ins on 8  box then on chair 10x each Mini squats 20x Leg Press 3x10 #87 Seated knee flexion with IR/distraction 10x Vaso 10 min 34 degree med compression   06/29/2024 TherEx:  UBE with BLEs & BUEs seat 12 level 1 with flexion stretch 2 reps, full revolutions backwards 2 min and full revolutions forward for 6 minutes Quad stretch with RLE over edge of mat & knee flex with strap 30 sec hold 3 reps Hamstring stretch seated with RLE long sit & strap assist along with maintaining pressure on knee to prevent flexing 30 sec hold 3 reps Standing right knee flexion stretch with foot edge of chair against wall 10 sec hold 3 reps 3 sets moving stance foot closer each set to increase stretch.    Gait Training: Pt amb 100' x 2 with cane working on carry over with verbal cues on gait pattern with terminal stance / swing  flexion and weight acceptance /  stance ext and upright trunk.   TherAct:  PT demo & verbal cues on use of 24 bar stool for standing activities to build tolerance with partial weight on RLE and sit to stand without UE support & sitting down with light touch for safety.  Pt verbalized and return demo understanding.  Step up and over with 6 step leading with Rt LE up and Lt LE down 2x8 with bilat UE support // bars. PT cues on technique.   Manual:  Seated knee flexion with IR/distraction 1x10 with 10s hold and PT providing PROM knee extension with distraction for 10s between each set with contract-relax Instrument assisted Soft tissue & scar mobs and suction cupping Grade IV mobs for ext  Vaso:  Rt LE elevated on wedge for 10 minutes with medium compression at 34deg    HOME EXERCISE PROGRAM: Access Code: [email or web address] URL: [email or web address] Date: 06/29/2024 Prepared by: Grayce Spatz  Exercises - Seated Knee Flexion AAROM  - 10 x daily - 7 x weekly - 1 sets - 10 reps - 5 seconds hold - Supine Heel Slide with Strap  - 10 x daily - 7 x weekly - 1 sets - 10 reps - 5 seconds hold - Supine Quadricep Sets  - 10 x daily - 7 x weekly - 2 sets - 10 reps - 5 second hold - Seated Knee Extension AROM  - 10 x daily - 7 x weekly - 1 sets - 10 reps - 2 seconds hold - Standing Knee Flexion Stretch on Step  - 1 x daily - 7 x weekly - 3-5 sets - 3 reps - 10 seconds hold - Gastroc Stretch on Step  - 1-2 x daily - 7 x weekly - 1 sets - 3 reps - 30 seconds hold - Seated Table Hamstring Stretch  - 1-2 x daily - 7 x weekly - 1 sets - 3 reps - 30 seconds hold - Supine Quadriceps Stretch with Strap on Table  - 1-2 x daily - 7 x weekly - 1 sets - 3 reps - 30 seconds hold  ASSESSMENT:  CLINICAL IMPRESSION:  PROM is 0 - 5 - 82 degrees today.  Jamy can get to within 5 degrees of full extension on her own, but needs assistance to get to 82 degrees (was 98 the last time I saw her).  Flexion AROM remains priority #1 with both her  home and clinic program.  OBJECTIVE IMPAIRMENTS: Abnormal gait, decreased activity tolerance, decreased endurance, decreased mobility, difficulty walking, decreased ROM, decreased strength, increased edema, impaired perceived functional ability, and pain.   ACTIVITY LIMITATIONS: bending, sitting, standing, squatting, sleeping, stairs, dressing, and locomotion level  PARTICIPATION LIMITATIONS: cleaning, driving, and community activity  PERSONAL FACTORS: OA, asthma, Bipolar, DDD L4-S1, headache, HTN, Lt leg neuropathy, obesity, pre-diabetes, plantar fascia surgery, bilateral knee surgeries, spinal fusion, bilateral carpal tunnel are also affecting patient's functional outcome.   REHAB POTENTIAL: Good  CLINICAL DECISION MAKING: Evolving/moderate complexity  EVALUATION COMPLEXITY: Moderate   GOALS: Goals reviewed with patient? Yes  SHORT TERM GOALS: Target date: 07/14/2024 Maizey will be independent with her day 1 HEP Baseline: Started 06/16/2024 Goal status: Met 07/08/2024  2.  Improve right knee AROM to 0 - 5 - 95 degrees Baseline: 0 - 13 - 70 degrees Goal status:   Partially met 07/08/2024  3.  Improve right quadriceps strength as assessed by functional self-report Baseline: 2+/5 Goal status: Ongoing  06/28/2024   LONG TERM GOALS: Target  date: 08/11/2024  Improve Patient Specific Functional Score to 5 Baseline: 0.6 Goal status: Ongoing  06/28/2024  2.  Aanya will report right knee pain consistently 0-3/10 on the Visual Analog Scale Baseline: 7-10/10 Goal status: Ongoing  07/08/2024  3.  Improve right knee AROM to 0 - 2 - 110 degrees Baseline: 0 - 13 - 70 degrees Goal status: Ongoing  07/08/2024  4.  Improve right quadriceps strength to 4/5 MMT Baseline: 2+/5 MMT Goal status: Ongoing  06/28/2024  5.  Jewelianna will be able to ambulate in the community without a noticeable limp due to her range deficit Baseline: Limited by a 13 degree extension deficit Goal status: Ongoing  07/08/2024  6.  Nara will be independent with her long-term maintenance HEP at DC Baseline: Started 06/16/2024 Goal status: Ongoing 07/08/2024   PLAN:  PT FREQUENCY: 3 x a week for 5-6 weeks  PT DURATION: 8 weeks  PLANNED INTERVENTIONS: 97750- Physical Performance Testing, 97110-Therapeutic exercises, 97530- Therapeutic activity, 97112- Neuromuscular re-education, 97535- Self Care, 02859- Manual therapy, [phone]- Gait training, [phone]- Electrical stimulation (unattended), 97016- Vasopneumatic device, 20560 (1-2 muscles), 20561 (3+ muscles)- Dry Needling, Patient/Family education, Balance training, Stair training, Joint mobilization, and Cryotherapy  PLAN FOR NEXT SESSION: Post-manipulation so heavy passive, active-assisted and active range of motion emphasis    Myer LELON Ivory, PT, MPT 07/08/2024, 4:37 PM  "

## 2024-07-11 ENCOUNTER — Encounter

## 2024-07-14 ENCOUNTER — Encounter: Payer: Self-pay | Admitting: Rehabilitative and Restorative Service Providers"

## 2024-07-14 ENCOUNTER — Ambulatory Visit: Payer: Medicare (Managed Care) | Admitting: Rehabilitative and Restorative Service Providers"

## 2024-07-14 ENCOUNTER — Encounter

## 2024-07-14 DIAGNOSIS — M25561 Pain in right knee: Secondary | ICD-10-CM | POA: Diagnosis not present

## 2024-07-14 DIAGNOSIS — R262 Difficulty in walking, not elsewhere classified: Secondary | ICD-10-CM

## 2024-07-14 DIAGNOSIS — R600 Localized edema: Secondary | ICD-10-CM

## 2024-07-14 DIAGNOSIS — M25661 Stiffness of right knee, not elsewhere classified: Secondary | ICD-10-CM

## 2024-07-14 DIAGNOSIS — M6281 Muscle weakness (generalized): Secondary | ICD-10-CM

## 2024-07-14 DIAGNOSIS — G8929 Other chronic pain: Secondary | ICD-10-CM | POA: Diagnosis not present

## 2024-07-14 NOTE — Therapy (Signed)
 " OUTPATIENT PHYSICAL THERAPY LOWER EXTREMITY TREATMENT   Patient Name: [name] MRN: [id number] DOB:03-10-1966, 59 y.o., female Today's Date: 07/14/2024  END OF SESSION:  PT End of Session - 07/14/24 1600     Visit Number 14    Number of Visits 28    Date for Recertification  08/11/24    Authorization Type [employer] MEDICARE    PT Start Time 1600    PT Stop Time 1647    PT Time Calculation (min) 47 min    Activity Tolerance Patient tolerated treatment well;No increased pain;Patient limited by pain    Behavior During Therapy [place] for tasks assessed/performed             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   Obesity 07/14/2012   Pneumonia    Polycystic disease, ovaries    Pre-diabetes    Reflux    Sinus tachycardia 07/14/2012   Pt says HR > 100 is consistent   Past Surgical History:  Procedure Laterality Date   COLONOSCOPY     FOOT SURGERY Left    Right plantar facsiitis scrape   KNEE CLOSED REDUCTION Right 06/16/2024   Procedure: MANIPULATION, KNEE, CLOSED;  Surgeon: Vernetta Lonni GRADE, MD;  Location: [facility];  Service: Orthopedics;  Laterality: Right;   KNEE SURGERY Bilateral 2007   Tendon release   KNEE SURGERY Right 04/05/2024   Total R Knee Replacement   LAPAROSCOPIC GASTRIC SLEEVE RESECTION N/A 03/30/2017   Procedure: LAPAROSCOPIC GASTRIC SLEEVE RESECTION, UPPER ENDO;  Surgeon: Tanda Locus, MD;  Location: [facility];  Service: General;  Laterality: N/A;   NASAL SINUS SURGERY  2004   OVARIAN CYST REMOVAL  1998    A cyst on the fallopian tube removed   SPINAL FUSION     TONSILLECTOMY  1996   TOTAL KNEE ARTHROPLASTY Right 04/05/2024   Procedure: ARTHROPLASTY, KNEE, TOTAL;  Surgeon: Vernetta Lonni GRADE, MD;  Location: [facility];  Service: Orthopedics;  Laterality: Right;   Patient Active Problem List   Diagnosis Date Noted   Arthrofibrosis of knee joint, right 06/15/2024   Status post total right knee replacement 04/05/2024   Actinic keratosis 01/18/2024   Absolute anemia 01/18/2024   Pruritus 01/18/2024   Bilateral carpal tunnel syndrome 11/25/2023   History of sleeve gastrectomy 11/25/2023   Generalized anxiety disorder with panic attacks 08/12/2023   Primary insomnia 08/12/2023   Chronic constipation 08/12/2023   Contusion of nose 04/23/2023   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   Chronic migraine without aura, with intractable migraine, so stated, with status migrainosus 01/25/2023   History of psychiatric treatment 01/13/2023   Viral upper respiratory tract infection 03/28/2021   Episodic tension-type headache, not intractable 11/07/2020   Hypertrophy of inferior nasal turbinate 11/07/2020   Postnasal drip 11/07/2020   Lymphadenopathy, anterior cervical 07/13/2020   Sebaceous cyst 07/13/2020   Iron  deficiency anemia secondary to inadequate dietary iron   intake 01/12/2020   Constipation 12/13/2019   Hyperlipidemia LDL goal <130 09/08/2019   Positive colorectal cancer screening using Cologuard test 03/02/2018   Leukocytosis 01/26/2018   Onychomycosis of great toe 12/22/2017   Family history of DVT 03/20/2017   Unilateral primary osteoarthritis, right knee 05/13/2016   Dyslipidemia 03/04/2016   Right knee pain 03/03/2016   Pre-diabetes 11/01/2015   Allergic rhinitis 05/28/2015   Sleep state misperception 07/05/2014   BMI 33.0-33.9,adult 05/24/2013   Polycystic ovary disease 07/14/2012   Obstructive sleep apnea (adult) (pediatric) 02/04/2012   Perimenopausal 11/10/2011   Hot flashes  11/10/2011   Disturbance in sleep behavior 09/16/2011   Asthma 05/23/2011   Backache 05/23/2011   Bipolar disorder in partial remission 05/23/2011   Dysphagia 05/23/2011   Esophageal reflux 05/23/2011   Rhinitis, allergic 05/23/2011    PCP: Camie Moats, PA-C  REFERRING PROVIDER: Lonni CINDERELLA Poli, MD  REFERRING DIAG:  [phone] (ICD-10-CM) - Status post total right knee replacement  Post-manipulation this morning  THERAPY DIAG:  Difficulty in walking, not elsewhere classified  Muscle weakness (generalized)  Localized edema  Chronic pain of right knee  Stiffness of right knee, not elsewhere classified  Rationale for Evaluation and Treatment: Rehabilitation  ONSET DATE: Manipulation 06/16/2024, TKA 04/05/2024  SUBJECTIVE:   SUBJECTIVE STATEMENT: [name] notes her husband took her truck today and she had a hard time getting in and out of her [employer].  She reports good compliance with her HEP.  She wants to do more but has a hard time getting past the pain.  PERTINENT HISTORY: OA, asthma, Bipolar, DDD L4-S1, headache, HTN, Lt leg neuropathy, obesity, pre-diabetes, plantar fascia surgery, bilateral knee surgeries, spinal fusion, bilateral carpal tunnel  [name] had her right knee replaced 04/05/2024.  She only had home health PT due to difficulty getting to outpatient PT.  Her manipulation was this morning and she is motivated to fix her right knee.  PAIN:  Are you having pain?  Yes: NPRS scale: Remains 5-8/10 this week Pain location: Rt knee Pain description: Stiff, achy Aggravating factors: Bending, walking, in and out of the car Relieving factors: Ice, meds PRECAUTIONS: Back  RED FLAGS: None   WEIGHT BEARING RESTRICTIONS: No  FALLS:  Has patient fallen in last 6 months? No  LIVING ENVIRONMENT: Lives with: lives with their family, lives with their spouse, and mom Lives in: House/apartment Stairs: Difficulty with stairs Has following equipment at home:  None  OCCUPATION: Disbailty  PLOF: Needs assistance with ADLs  PATIENT GOALS: Bend the knee normally  NEXT MD VISIT: 06/29/2024  OBJECTIVE:  Note: Objective measures were completed at Evaluation unless otherwise noted.  DIAGNOSTIC FINDINGS: Right knee arthroplasty without immediate postoperative complication.   PATIENT SURVEYS:  PSFS: THE PATIENT SPECIFIC FUNCTIONAL SCALE  Place score of 0-10 (0 = unable to perform activity and 10 = able to perform activity at the same level as before injury or problem)  Activity Date: 06/16/2024 06/28/24   ADLS including get in/out of a vehicle; clothes out of the dryer 1 x 3  6   2.  Sleep 0 x 2 1   3. Walk including on uneven ground, ramps, curbs  1   4. stairs  1   Total Score 0.06 2.25     Total Score = Sum of activity scores/number of activities  Minimally Detectable Change: 3 points (for single activity); 2 points (for average score)  [name] (nd). The Patient Specific Functional Scale . Retrieved from [email or web address]  COGNITION: Overall cognitive status: Within functional limits for tasks assessed    SENSATION: No new tingling or paresthesias  EDEMA:  Noted and not objectively assessed  LOWER EXTREMITY ROM:   AROM Left/Right  06/16/24 Right  06/17/24 Right  06/20/24 Right 06/23/24 Rt:  06/24/24 Rt  06/27/24 Right 06/28/24 Right 07/06/24  Right 07/08/2024 Right 07/14/2024  Hip flexion            Hip extension            Hip abduction            Hip adduction            Hip internal rotation            Hip external rotation            Knee flexion 131/70 (Right 95 post exercises) 98 with PT assist Supine  80A 95 P Supine  84 A Supine AROM 84deg Supine AAROM 72  Seated P: 84* Supine 74A Passive 82 Passive 88  Knee extension 0/-13 (Right -7 post-exercises) -7 active       -5 active -5 active  Ankle dorsiflexion            Ankle  plantarflexion            Ankle inversion            Ankle eversion             (Blank rows = not tested)  LOWER EXTREMITY STRENGTH:  MMT Left/Right  06/16/24   Hip flexion    Hip extension    Hip abduction    Hip adduction    Hip internal rotation    Hip external rotation    Knee flexion    Knee extension 4/2+   Ankle dorsiflexion    Ankle plantarflexion    Ankle inversion    Ankle eversion     (Blank rows = not tested)  GAIT: Distance walked: 50 feet Assistive device utilized: None Level of assistance: Complete Independence Comments: Gracy has limited WB endurance and walks with a very stiff gait, unable to fully extend her right knee    TREATMENT DATE: R TKA 07/14/2024 Recumbent bike Seat 7 for 5 minutes active assisted range of motion, remove foot strap on the right Seated knee flexion AAROM (left pushes right into flexion) 4 sets of 5 x 10 seconds (1st  3 sets with PT assist) Quad sets with right heel prop 10 x 5 seconds  Functional Activities: Double Leg Press 125# 25 reps for 5 seconds at the bend, full extension Single Leg Press 50# 15 reps for 5 seconds at the bend, full extension   Vaso Right Knee 10 minutes High 34* 10 minutes   07/08/2024 Recumbent bike Seat 7 for 5 minutes active assisted range of motion Seated knee flexion AAROM (left pushes right into flexion) 4 sets of 5 x 10 seconds (All 4 sets with PT assist) Quad sets with right heel prop 10 x 5 seconds  Functional Activities: Double Leg Press 100# 25 reps for 5 seconds at the bend, full extension Single Leg Press 50# 10 reps for 5 seconds at the bend, full extension   Vaso Right Knee 10 minutes High 34* 10 minutes   07/06/24 TherEx:  Sci fit UE/LE seat 12 full revolutions 7 min Knee flexion lean ins on 8  box then on chair 10x each Mini squats 20x Leg Press 3x10 #87 Seated knee flexion with IR/distraction 10x  Vaso 10 min 34 degree med compression  HOME EXERCISE PROGRAM: Access Code:  [email or web address] URL: [email or web address] Date: 06/29/2024 Prepared by: Grayce Spatz  Exercises - Seated Knee Flexion AAROM  - 10 x daily - 7 x weekly - 1 sets - 10 reps - 5 seconds hold - Supine Heel Slide with Strap  - 10 x daily - 7 x weekly - 1 sets - 10 reps - 5 seconds hold - Supine Quadricep Sets  - 10 x daily - 7 x weekly - 2 sets - 10 reps - 5 second hold - Seated Knee Extension AROM  - 10 x daily - 7 x weekly - 1 sets - 10 reps - 2 seconds hold - Standing Knee Flexion Stretch on Step  - 1 x daily - 7 x weekly - 3-5 sets - 3 reps - 10 seconds hold - Gastroc Stretch on Step  - 1-2 x daily - 7 x weekly - 1 sets - 3 reps - 30 seconds hold - Seated Table Hamstring Stretch  - 1-2 x daily - 7 x weekly - 1 sets - 3 reps - 30 seconds hold - Supine Quadriceps Stretch with Strap on Table  - 1-2 x daily - 7 x weekly - 1 sets - 3 reps - 30 seconds hold  ASSESSMENT:  CLINICAL IMPRESSION:  PROM is 0 - 5 - 88 degrees today, better than a week ago.  [name] can get to within 5 degrees of full extension on her own, but needs assistance to get to 88 degrees (best post-manipulation was 98).  Flexion AROM remains priority #1 with both her home and clinic program.  [name] continues to be consistent with her HEP but is pain-limited.  OBJECTIVE IMPAIRMENTS: Abnormal gait, decreased activity tolerance, decreased endurance, decreased mobility, difficulty walking, decreased ROM, decreased strength, increased edema, impaired perceived functional ability, and pain.   ACTIVITY LIMITATIONS: bending, sitting, standing, squatting, sleeping, stairs, dressing, and locomotion level  PARTICIPATION LIMITATIONS: cleaning, driving, and community activity  PERSONAL FACTORS: OA, asthma, Bipolar, DDD L4-S1, headache, HTN, Lt leg neuropathy, obesity, pre-diabetes, plantar fascia surgery, bilateral knee surgeries, spinal fusion, bilateral carpal tunnel are also affecting patient's functional outcome.   REHAB  POTENTIAL: Good  CLINICAL DECISION MAKING: Evolving/moderate complexity  EVALUATION COMPLEXITY: Moderate   GOALS: Goals reviewed with patient? Yes  SHORT TERM GOALS: Target date: 07/14/2024 [name] will be independent with her day 1 HEP Baseline: Started 06/16/2024 Goal status: Met 07/08/2024  2.  Improve right knee AROM to 0 - 5 - 95 degrees Baseline: 0 - 13 - 70 degrees Goal status:   Partially met 07/14/2024  3.  Improve right quadriceps strength as assessed by functional self-report Baseline: 2+/5 Goal status: Ongoing  06/28/2024   LONG TERM GOALS: Target date: 08/11/2024  Improve Patient Specific Functional Score to 5 Baseline: 0.6 Goal status: Ongoing  06/28/2024  2.  [name] will report right knee pain consistently 0-3/10 on the Visual Analog Scale Baseline: 7-10/10 Goal status: Ongoing  07/14/2024  3.  Improve right knee AROM to 0 - 2 - 110 degrees Baseline: 0 - 13 - 70 degrees Goal status: Ongoing  07/14/2024  4.  Improve right quadriceps strength to 4/5 MMT Baseline: 2+/5 MMT Goal status: Ongoing  06/28/2024  5.  [name] will be able to ambulate in the community without a noticeable limp due to her range deficit Baseline: Limited by a 13 degree extension deficit Goal status: Ongoing 07/14/2024  6.  [name] will be independent with her long-term  maintenance HEP at [place] Baseline: Started 06/16/2024 Goal status: Ongoing 07/14/2024   PLAN:  PT FREQUENCY: 3 x a week for 5-6 weeks  PT DURATION: 8 weeks  PLANNED INTERVENTIONS: 97750- Physical Performance Testing, 97110-Therapeutic exercises, 97530- Therapeutic activity, 97112- Neuromuscular re-education, 97535- Self Care, 02859- Manual therapy, [phone]- Gait training, [phone]- Electrical stimulation (unattended), 97016- Vasopneumatic device, 20560 (1-2 muscles), 20561 (3+ muscles)- Dry Needling, Patient/Family education, Balance training, Stair training, Joint mobilization, and Cryotherapy  PLAN FOR NEXT SESSION: Post-manipulation so  heavy passive, active-assisted and active range of motion emphasis    Myer LELON Ivory, PT, MPT 07/14/2024, 5:02 PM  "

## 2024-07-15 ENCOUNTER — Encounter: Admitting: Rehabilitative and Restorative Service Providers"

## 2024-07-20 ENCOUNTER — Encounter: Payer: Self-pay | Admitting: Rehabilitative and Restorative Service Providers"

## 2024-07-20 ENCOUNTER — Ambulatory Visit: Payer: Medicare (Managed Care) | Admitting: Rehabilitative and Restorative Service Providers"

## 2024-07-20 DIAGNOSIS — M25561 Pain in right knee: Secondary | ICD-10-CM | POA: Diagnosis not present

## 2024-07-20 DIAGNOSIS — M6281 Muscle weakness (generalized): Secondary | ICD-10-CM

## 2024-07-20 DIAGNOSIS — G8929 Other chronic pain: Secondary | ICD-10-CM | POA: Diagnosis not present

## 2024-07-20 DIAGNOSIS — M25661 Stiffness of right knee, not elsewhere classified: Secondary | ICD-10-CM

## 2024-07-20 DIAGNOSIS — R262 Difficulty in walking, not elsewhere classified: Secondary | ICD-10-CM | POA: Diagnosis not present

## 2024-07-20 DIAGNOSIS — R600 Localized edema: Secondary | ICD-10-CM

## 2024-07-20 NOTE — Therapy (Signed)
 " OUTPATIENT PHYSICAL THERAPY LOWER EXTREMITY TREATMENT   Patient Name: [name] MRN: [id number] DOB:March 28, 1966, 59 y.o., female Today's Date: 07/20/2024  END OF SESSION:  PT End of Session - 07/20/24 1557     Visit Number 15    Number of Visits 28    Date for Recertification  08/11/24    Authorization Type [employer] MEDICARE    PT Start Time 1557    PT Stop Time 1654    PT Time Calculation (min) 57 min    Activity Tolerance Patient tolerated treatment well;No increased pain;Patient limited by pain    Behavior During Therapy [facility] for tasks assessed/performed              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   Obesity 07/14/2012   Pneumonia    Polycystic disease, ovaries    Pre-diabetes    Reflux    Sinus tachycardia 07/14/2012   Pt says HR > 100 is consistent   Past Surgical History:  Procedure Laterality Date   COLONOSCOPY     FOOT SURGERY Left    Right plantar facsiitis scrape   KNEE CLOSED REDUCTION Right 06/16/2024   Procedure: MANIPULATION, KNEE, CLOSED;  Surgeon: Vernetta Lonni GRADE, MD;  Location: [facility];  Service: Orthopedics;  Laterality: Right;   KNEE SURGERY Bilateral 2007   Tendon release   KNEE SURGERY Right 04/05/2024   Total R Knee Replacement   LAPAROSCOPIC GASTRIC SLEEVE RESECTION N/A 03/30/2017   Procedure: LAPAROSCOPIC GASTRIC SLEEVE RESECTION, UPPER ENDO;  Surgeon: Tanda Locus, MD;  Location: [facility];  Service: General;  Laterality: N/A;   NASAL SINUS SURGERY  2004   OVARIAN CYST REMOVAL   1998   A cyst on the fallopian tube removed   SPINAL FUSION     TONSILLECTOMY  1996   TOTAL KNEE ARTHROPLASTY Right 04/05/2024   Procedure: ARTHROPLASTY, KNEE, TOTAL;  Surgeon: Vernetta Lonni GRADE, MD;  Location: [facility];  Service: Orthopedics;  Laterality: Right;   Patient Active Problem List   Diagnosis Date Noted   Arthrofibrosis of knee joint, right 06/15/2024   Status post total right knee replacement 04/05/2024   Actinic keratosis 01/18/2024   Absolute anemia 01/18/2024   Pruritus 01/18/2024   Bilateral carpal tunnel syndrome 11/25/2023   History of sleeve gastrectomy 11/25/2023   Generalized anxiety disorder with panic attacks 08/12/2023   Primary insomnia 08/12/2023   Chronic constipation 08/12/2023   Contusion of nose 04/23/2023   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   Chronic migraine without aura, with intractable migraine, so stated, with status migrainosus 01/25/2023   History of psychiatric treatment 01/13/2023   Viral upper respiratory tract infection 03/28/2021   Episodic tension-type headache, not intractable 11/07/2020   Hypertrophy of inferior nasal turbinate 11/07/2020   Postnasal drip 11/07/2020   Lymphadenopathy, anterior cervical 07/13/2020   Sebaceous cyst 07/13/2020   Iron  deficiency anemia secondary to inadequate dietary  iron  intake 01/12/2020   Constipation 12/13/2019   Hyperlipidemia LDL goal <130 09/08/2019   Positive colorectal cancer screening using Cologuard test 03/02/2018   Leukocytosis 01/26/2018   Onychomycosis of great toe 12/22/2017   Family history of DVT 03/20/2017   Unilateral primary osteoarthritis, right knee 05/13/2016   Dyslipidemia 03/04/2016   Right knee pain 03/03/2016   Pre-diabetes 11/01/2015   Allergic rhinitis 05/28/2015   Sleep state misperception 07/05/2014   BMI 33.0-33.9,adult 05/24/2013   Polycystic ovary disease 07/14/2012   Obstructive sleep apnea (adult) (pediatric) 02/04/2012   Perimenopausal 11/10/2011   Hot flashes  11/10/2011   Disturbance in sleep behavior 09/16/2011   Asthma 05/23/2011   Backache 05/23/2011   Bipolar disorder in partial remission 05/23/2011   Dysphagia 05/23/2011   Esophageal reflux 05/23/2011   Rhinitis, allergic 05/23/2011    PCP: Camie Moats, PA-C  REFERRING PROVIDER: Lonni CINDERELLA Poli, MD  REFERRING DIAG:  [phone] (ICD-10-CM) - Status post total right knee replacement  Post-manipulation this morning  THERAPY DIAG:  Difficulty in walking, not elsewhere classified  Muscle weakness (generalized)  Localized edema  Chronic pain of right knee  Stiffness of right knee, not elsewhere classified  Rationale for Evaluation and Treatment: Rehabilitation  ONSET DATE: Manipulation 06/16/2024, TKA 04/05/2024  SUBJECTIVE:   SUBJECTIVE STATEMENT: [name] notes continued consistent HEP compliance.  She is still having trouble getting in and out of her [employer].  She wants to do more but has a hard time getting past the pain.  She notes better gait quality over the past week or so.  PERTINENT HISTORY: OA, asthma, Bipolar, DDD L4-S1, headache, HTN, Lt leg neuropathy, obesity, pre-diabetes, plantar fascia surgery, bilateral knee surgeries, spinal fusion, bilateral carpal tunnel  [name] had her right knee replaced 04/05/2024.  She only had home health PT due to difficulty getting to outpatient PT.  Her manipulation was this morning and she is motivated to fix her right knee.  PAIN:  Are you having pain?  Yes: NPRS scale: Slightly improved at 4-8/10 this week Pain location: Rt knee Pain description: Stiff, achy Aggravating factors: Bending, walking, in and out of the car Relieving factors: Ice, meds PRECAUTIONS: Back  RED FLAGS: None   WEIGHT BEARING RESTRICTIONS: No  FALLS:  Has patient fallen in last 6 months? No  LIVING ENVIRONMENT: Lives with: lives with their family, lives with their spouse, and mom Lives in: House/apartment Stairs: Difficulty with  stairs Has following equipment at home: None  OCCUPATION: Disbailty  PLOF: Needs assistance with ADLs  PATIENT GOALS: Bend the knee normally  NEXT MD VISIT: 06/29/2024  OBJECTIVE:  Note: Objective measures were completed at Evaluation unless otherwise noted.  DIAGNOSTIC FINDINGS: Right knee arthroplasty without immediate postoperative complication.   PATIENT SURVEYS:  PSFS: THE PATIENT SPECIFIC FUNCTIONAL SCALE  Place score of 0-10 (0 = unable to perform activity and 10 = able to perform activity at the same level as before injury or problem)  Activity Date: 06/16/2024 06/28/24 07/21/2023  ADLS including get in/out of a vehicle; clothes out of the dryer 1 x 3  6 7   2.  Sleep 0 x 2 1 2.5  3. Walk including on uneven ground, ramps, curbs  1 5  4. stairs  1 3  Total Score 0.06 2.25 4.38    Total Score = Sum of activity scores/number of activities  Minimally Detectable Change: 3 points (for single activity); 2 points (for average score)  [facility] (nd). The Patient Specific Functional  Scale . Retrieved from [email or web address].com.pt   COGNITION: Overall cognitive status: Within functional limits for tasks assessed    SENSATION: No new tingling or paresthesias  EDEMA:  Noted and not objectively assessed  LOWER EXTREMITY ROM:   AROM Left/Right  06/16/24 Right  06/17/24 Right  06/20/24 Right 06/23/24 Rt:  06/24/24 Rt  06/27/24 Right 06/28/24 Right 07/06/24  Right 07/08/2024 Right 07/14/2024 Right 07/20/2024  Hip flexion             Hip extension             Hip abduction             Hip adduction             Hip internal rotation             Hip external rotation             Knee flexion 131/70 (Right 95 post exercises) 98 with PT assist Supine  80A 95 P Supine  84 A Supine AROM 84deg Supine AAROM 72  Seated P: 84* Supine 74A Passive 82 Passive 88 Active 84 Passive 95    Knee extension 0/-13 (Right  -7 post-exercises) -7 active       -5 active -5 active - 5 Active  Ankle dorsiflexion             Ankle plantarflexion             Ankle inversion             Ankle eversion              (Blank rows = not tested)  LOWER EXTREMITY STRENGTH:  MMT Left/Right  06/16/24   Hip flexion    Hip extension    Hip abduction    Hip adduction    Hip internal rotation    Hip external rotation    Knee flexion    Knee extension 4/2+   Ankle dorsiflexion    Ankle plantarflexion    Ankle inversion    Ankle eversion     (Blank rows = not tested)  GAIT: Distance walked: 50 feet Assistive device utilized: None Level of assistance: Complete Independence Comments: Shylo has limited WB endurance and walks with a very stiff gait, unable to fully extend her right knee    TREATMENT DATE: R TKA 07/20/2024 Recumbent bike Seat 7 for 6 minutes active assisted range of motion, remove foot strap on the right Seated knee flexion AAROM (left pushes right into flexion) 4 sets of 5 x 10 seconds (All 4 sets with PT assist) Quad sets with right heel prop & PT overpressure 2 sets of 10 x 5 seconds  Functional Activities: Double Leg Press 125# 25 reps for 5 seconds at the bend, full extension for sit to stand, stairs, curbs Single Leg Press 50# 15 reps for 5 seconds at the bend, full extension for sit to stand, stairs, curbs  Vaso Right Knee 10 minutes High 34* 10 minutes   07/14/2024 Recumbent bike Seat 7 for 5 minutes active assisted range of motion, remove foot strap on the right Seated knee flexion AAROM (left pushes right into flexion) 4 sets of 5 x 10 seconds (1st  3 sets with PT assist) Quad sets with right heel prop 10 x 5 seconds  Functional Activities: Double Leg Press 125# 25 reps for 5 seconds at the bend, full extension Single Leg Press 50# 15 reps for 5 seconds at the bend, full extension  Vaso Right Knee 10 minutes High 34* 10 minutes   07/08/2024 Recumbent bike Seat 7 for 5 minutes active  assisted range of motion Seated knee flexion AAROM (left pushes right into flexion) 4 sets of 5 x 10 seconds (All 4 sets with PT assist) Quad sets with right heel prop 10 x 5 seconds  Functional Activities: Double Leg Press 100# 25 reps for 5 seconds at the bend, full extension Single Leg Press 50# 10 reps for 5 seconds at the bend, full extension   Vaso Right Knee 10 minutes High 34* 10 minutes   HOME EXERCISE PROGRAM: Access Code: [email or web address] URL: [email or web address] Date: 06/29/2024 Prepared by: Grayce Spatz  Exercises - Seated Knee Flexion AAROM  - 10 x daily - 7 x weekly - 1 sets - 10 reps - 5 seconds hold - Supine Heel Slide with Strap  - 10 x daily - 7 x weekly - 1 sets - 10 reps - 5 seconds hold - Supine Quadricep Sets  - 10 x daily - 7 x weekly - 2 sets - 10 reps - 5 second hold - Seated Knee Extension AROM  - 10 x daily - 7 x weekly - 1 sets - 10 reps - 2 seconds hold - Standing Knee Flexion Stretch on Step  - 1 x daily - 7 x weekly - 3-5 sets - 3 reps - 10 seconds hold - Gastroc Stretch on Step  - 1-2 x daily - 7 x weekly - 1 sets - 3 reps - 30 seconds hold - Seated Table Hamstring Stretch  - 1-2 x daily - 7 x weekly - 1 sets - 3 reps - 30 seconds hold - Supine Quadriceps Stretch with Strap on Table  - 1-2 x daily - 7 x weekly - 1 sets - 3 reps - 30 seconds hold  ASSESSMENT:  CLINICAL IMPRESSION:  PROM is 0 - 5 - 95 degrees today, significantly better than 5 days ago.  Deloma can get to within 5 degrees of full extension on her own, but needs assistance to get to 95 degrees (best objective measurement post-manipulation was 98).  Flexion AROM remains priority #1 with both her home and clinic program.  Micalah continues to be consistent with her HEP but is pain-limited and the focus of both her home and clinic program will be range of motion post-manipulation.  OBJECTIVE IMPAIRMENTS: Abnormal gait, decreased activity tolerance, decreased endurance, decreased  mobility, difficulty walking, decreased ROM, decreased strength, increased edema, impaired perceived functional ability, and pain.   ACTIVITY LIMITATIONS: bending, sitting, standing, squatting, sleeping, stairs, dressing, and locomotion level  PARTICIPATION LIMITATIONS: cleaning, driving, and community activity  PERSONAL FACTORS: OA, asthma, Bipolar, DDD L4-S1, headache, HTN, Lt leg neuropathy, obesity, pre-diabetes, plantar fascia surgery, bilateral knee surgeries, spinal fusion, bilateral carpal tunnel are also affecting patient's functional outcome.   REHAB POTENTIAL: Good  CLINICAL DECISION MAKING: Evolving/moderate complexity  EVALUATION COMPLEXITY: Moderate   GOALS: Goals reviewed with patient? Yes  SHORT TERM GOALS: Target date: 07/14/2024 Pina will be independent with her day 1 HEP Baseline: Started 06/16/2024 Goal status: Met 07/08/2024  2.  Improve right knee AROM to 0 - 5 - 95 degrees Baseline: 0 - 13 - 70 degrees Goal status:   Met 07/20/2024  3.  Improve right quadriceps strength as assessed by functional self-report Baseline: 2+/5 Goal status: Ongoing  06/28/2024   LONG TERM GOALS: Target date: 08/11/2024  Improve Patient Specific Functional Score to 5 Baseline: 0.6 Goal status: Ongoing  07/20/2024  2.  Mirage will report right knee pain consistently 0-3/10 on the Visual Analog Scale Baseline: 7-10/10 Goal status: Ongoing  07/20/2024  3.  Improve right knee AROM to 0 - 2 - 110 degrees Baseline: 0 - 13 - 70 degrees Goal status: Ongoing  07/20/2024  4.  Improve right quadriceps strength to 4/5 MMT Baseline: 2+/5 MMT Goal status: Ongoing  06/28/2024  5.  Kaithlyn will be able to ambulate in the community without a noticeable limp due to her range deficit Baseline: Limited by a 13 degree extension deficit Goal status: Ongoing 07/20/2024  6.  [place]anna will be independent with her long-term maintenance HEP at DC Baseline: Started 06/16/2024 Goal status: Ongoing  07/20/2024   PLAN:  PT FREQUENCY: 3 x a week for 5-6 weeks  PT DURATION: 8 weeks  PLANNED INTERVENTIONS: 97750- Physical Performance Testing, 97110-Therapeutic exercises, 97530- Therapeutic activity, 97112- Neuromuscular re-education, 97535- Self Care, 02859- Manual therapy, [phone]- Gait training, [phone]- Electrical stimulation (unattended), 97016- Vasopneumatic device, 20560 (1-2 muscles), 20561 (3+ muscles)- Dry Needling, Patient/Family education, Balance training, Stair training, Joint mobilization, and Cryotherapy  PLAN FOR NEXT SESSION: Post-manipulation so heavy passive, active-assisted and active range of motion emphasis    Myer LELON Ivory, PT, MPT 07/20/2024, 4:51 PM  "

## 2024-07-22 ENCOUNTER — Ambulatory Visit: Payer: Medicare (Managed Care) | Admitting: Rehabilitative and Restorative Service Providers"

## 2024-07-22 ENCOUNTER — Encounter: Payer: Self-pay | Admitting: Rehabilitative and Restorative Service Providers"

## 2024-07-22 DIAGNOSIS — M25661 Stiffness of right knee, not elsewhere classified: Secondary | ICD-10-CM

## 2024-07-22 DIAGNOSIS — M6281 Muscle weakness (generalized): Secondary | ICD-10-CM | POA: Diagnosis not present

## 2024-07-22 DIAGNOSIS — M25561 Pain in right knee: Secondary | ICD-10-CM | POA: Diagnosis not present

## 2024-07-22 DIAGNOSIS — G8929 Other chronic pain: Secondary | ICD-10-CM

## 2024-07-22 DIAGNOSIS — R600 Localized edema: Secondary | ICD-10-CM | POA: Diagnosis not present

## 2024-07-22 DIAGNOSIS — R262 Difficulty in walking, not elsewhere classified: Secondary | ICD-10-CM

## 2024-07-22 NOTE — Therapy (Signed)
 " OUTPATIENT PHYSICAL THERAPY LOWER EXTREMITY TREATMENT   Patient Name: [name] MRN: [id number] DOB:31-Dec-1965, 59 y.o., female Today's Date: 07/22/2024  END OF SESSION:  PT End of Session - 07/22/24 1517     Visit Number 16    Number of Visits 28    Date for Recertification  08/11/24    Authorization Type [employer] MEDICARE    PT Start Time 1513    PT Stop Time 1616    PT Time Calculation (min) 63 min    Activity Tolerance Patient tolerated treatment well;No increased pain;Patient limited by pain    Behavior During Therapy 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   Obesity 07/14/2012   Pneumonia    Polycystic disease, ovaries    Pre-diabetes    Reflux    Sinus tachycardia 07/14/2012   Pt says HR > 100 is consistent   Past Surgical History:  Procedure Laterality Date   COLONOSCOPY     FOOT SURGERY Left    Right plantar facsiitis scrape   KNEE CLOSED REDUCTION Right 06/16/2024   Procedure: MANIPULATION, KNEE, CLOSED;  Surgeon: Vernetta Lonni GRADE, MD;  Location: [facility];  Service: Orthopedics;  Laterality: Right;   KNEE SURGERY Bilateral 2007   Tendon release   KNEE SURGERY Right 04/05/2024   Total R Knee Replacement   LAPAROSCOPIC GASTRIC SLEEVE RESECTION N/A 03/30/2017   Procedure: LAPAROSCOPIC GASTRIC SLEEVE RESECTION, UPPER ENDO;  Surgeon: Tanda Locus, MD;  Location: [facility];  Service: General;  Laterality: N/A;   NASAL SINUS SURGERY  2004   OVARIAN CYST REMOVAL   1998   A cyst on the fallopian tube removed   SPINAL FUSION     TONSILLECTOMY  1996   TOTAL KNEE ARTHROPLASTY Right 04/05/2024   Procedure: ARTHROPLASTY, KNEE, TOTAL;  Surgeon: Vernetta Lonni GRADE, MD;  Location: [facility];  Service: Orthopedics;  Laterality: Right;   Patient Active Problem List   Diagnosis Date Noted   Arthrofibrosis of knee joint, right 06/15/2024   Status post total right knee replacement 04/05/2024   Actinic keratosis 01/18/2024   Absolute anemia 01/18/2024   Pruritus 01/18/2024   Bilateral carpal tunnel syndrome 11/25/2023   History of sleeve gastrectomy 11/25/2023   Generalized anxiety disorder with panic attacks 08/12/2023   Primary insomnia 08/12/2023   Chronic constipation 08/12/2023   Contusion of nose 04/23/2023   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   Chronic migraine without aura, with intractable migraine, so stated, with status migrainosus 01/25/2023   History of psychiatric treatment 01/13/2023   Viral upper respiratory tract infection 03/28/2021   Episodic tension-type headache, not intractable 11/07/2020   Hypertrophy of inferior nasal turbinate 11/07/2020   Postnasal drip 11/07/2020   Lymphadenopathy, anterior cervical 07/13/2020   Sebaceous cyst 07/13/2020   Iron  deficiency anemia secondary to inadequate  dietary iron  intake 01/12/2020   Constipation 12/13/2019   Hyperlipidemia LDL goal <130 09/08/2019   Positive colorectal cancer screening using Cologuard test 03/02/2018   Leukocytosis 01/26/2018   Onychomycosis of great toe 12/22/2017   Family history of DVT 03/20/2017   Unilateral primary osteoarthritis, right knee 05/13/2016   Dyslipidemia 03/04/2016   Right knee pain 03/03/2016   Pre-diabetes 11/01/2015   Allergic rhinitis 05/28/2015   Sleep state misperception 07/05/2014   BMI 33.0-33.9,adult 05/24/2013   Polycystic ovary disease 07/14/2012   Obstructive sleep apnea (adult) (pediatric) 02/04/2012   Perimenopausal 11/10/2011   Hot flashes  11/10/2011   Disturbance in sleep behavior 09/16/2011   Asthma 05/23/2011   Backache 05/23/2011   Bipolar disorder in partial remission 05/23/2011   Dysphagia 05/23/2011   Esophageal reflux 05/23/2011   Rhinitis, allergic 05/23/2011    PCP: Camie Moats, PA-C  REFERRING PROVIDER: Lonni CINDERELLA Poli, MD  REFERRING DIAG:  [id number] (ICD-10-CM) - Status post total right knee replacement  Post-manipulation this morning  THERAPY DIAG:  Difficulty in walking, not elsewhere classified  Muscle weakness (generalized)  Localized edema  Chronic pain of right knee  Stiffness of right knee, not elsewhere classified  Rationale for Evaluation and Treatment: Rehabilitation  ONSET DATE: Manipulation 06/16/2024, TKA 04/05/2024  SUBJECTIVE:   SUBJECTIVE STATEMENT: [name] continues to do is to do a good job with her home exercises.  She notes notes they had to stop the car 2 times on her way to [facility] for her to get out and move around secondary to right knee stiffness.  PERTINENT HISTORY: OA, asthma, Bipolar, DDD L4-S1, headache, HTN, Lt leg neuropathy, obesity, pre-diabetes, plantar fascia surgery, bilateral knee surgeries, spinal fusion, bilateral carpal tunnel  [name] had her right knee replaced 04/05/2024.  She only had home health PT due to difficulty getting to outpatient PT.  Her manipulation was this morning and she is motivated to fix her right knee.  PAIN:  Are you having pain?  Yes: NPRS scale: Still 4-8/10 this week Pain location: Rt knee Pain description: Stiff, achy Aggravating factors: Bending, walking, in and out of the car Relieving factors: Ice, meds PRECAUTIONS: Back  RED FLAGS: None   WEIGHT BEARING RESTRICTIONS: No  FALLS:  Has patient fallen in last 6 months? No  LIVING ENVIRONMENT: Lives with: lives with their family, lives with their spouse, and mom Lives in: House/apartment Stairs: Difficulty with stairs Has following equipment at home:  None  OCCUPATION: Disbailty  PLOF: Needs assistance with ADLs  PATIENT GOALS: Bend the knee normally  NEXT MD VISIT: 09/28/2024  OBJECTIVE:  Note: Objective measures were completed at Evaluation unless otherwise noted.  DIAGNOSTIC FINDINGS: Right knee arthroplasty without immediate postoperative complication.   PATIENT SURVEYS:  PSFS: THE PATIENT SPECIFIC FUNCTIONAL SCALE  Place score of 0-10 (0 = unable to perform activity and 10 = able to perform activity at the same level as before injury or problem)  Activity Date: 06/16/2024 06/28/24 07/21/2023  ADLS including get in/out of a vehicle; clothes out of the dryer 1 x 3  6 7   2.  Sleep 0 x 2 1 2.5  3. Walk including on uneven ground, ramps, curbs  1 5  4. stairs  1 3  Total Score 0.06 2.25 4.38    Total Score = Sum of activity scores/number of activities  Minimally Detectable Change: 3 points (for single activity); 2 points (for average score)  [name] (nd). The Patient Specific Functional Scale . Retrieved from  [email or web address]   COGNITION: Overall cognitive status: Within functional limits for tasks assessed    SENSATION: No new tingling or paresthesias  EDEMA:  Noted and not objectively assessed  LOWER EXTREMITY ROM:   AROM Left/Right  06/16/24 Right  06/17/24 Right  06/20/24 Right 06/23/24 Rt:  06/24/24 Rt  06/27/24 Right 06/28/24 Right 07/06/24  Right 07/08/2024 Right 07/14/2024 Right 07/20/2024 Right 07/22/2024  Hip flexion              Hip extension              Hip abduction              Hip adduction              Hip internal rotation              Hip external rotation              Knee flexion 131/70 (Right 95 post exercises) 98 with PT assist Supine  80A 95 P Supine  84 A Supine AROM 84deg Supine AAROM 72  Seated P: 84* Supine 74A Passive 82 Passive 88 Active 84 Passive 95   Active 88 Passive 98  Knee extension 0/-13  (Right -7 post-exercises) -7 active       -5 active -5 active - 5 Active -4 after exercise  Ankle dorsiflexion              Ankle plantarflexion              Ankle inversion              Ankle eversion               (Blank rows = not tested)  LOWER EXTREMITY STRENGTH:  MMT Left/Right  06/16/24   Hip flexion    Hip extension    Hip abduction    Hip adduction    Hip internal rotation    Hip external rotation    Knee flexion    Knee extension 4/2+   Ankle dorsiflexion    Ankle plantarflexion    Ankle inversion    Ankle eversion     (Blank rows = not tested)  GAIT: Distance walked: 50 feet Assistive device utilized: None Level of assistance: Complete Independence Comments: Shenique has limited WB endurance and walks with a very stiff gait, unable to fully extend her right knee    TREATMENT DATE: Rt TKA 07/22/2024 Recumbent bike Seat 7 for 5 minutes active assisted range of motion, remove foot strap on the right Seated knee flexion AAROM (left pushes right into flexion) 2 sets of 10 x 10 seconds (All sets with PT assist) Quad sets with right heel prop & PT overpressure 20 x 5 seconds  Functional Activities: Double Leg Press 125# (150# next visit) 25 reps for 5 seconds (with bounce) the bend, full extension for sit to stand, stairs, curbs Single Leg Press 75# 15 reps for 5 seconds at the bend, full extension for sit to stand, stairs, curbs  Vaso Right Knee 10 minutes High 34* 10 minutes    07/20/2024 Recumbent bike Seat 7 for 6 minutes active assisted range of motion, remove foot strap on the right Seated knee flexion AAROM (left pushes right into flexion) 4 sets of 5 x 10 seconds (All 4 sets with PT assist) Quad sets with right heel prop & PT overpressure 2 sets of 10 x 5 seconds  Functional Activities: Double Leg Press 125# 25 reps for  5 seconds at the bend, full extension for sit to stand, stairs, curbs Single Leg Press 50# 15 reps for 5 seconds at the bend, full extension  for sit to stand, stairs, curbs  Vaso Right Knee 10 minutes High 34* 10 minutes   07/14/2024 Recumbent bike Seat 7 for 5 minutes active assisted range of motion, remove foot strap on the right Seated knee flexion AAROM (left pushes right into flexion) 4 sets of 5 x 10 seconds (1st  3 sets with PT assist) Quad sets with right heel prop 10 x 5 seconds  Functional Activities: Double Leg Press 125# 25 reps for 5 seconds at the bend, full extension Single Leg Press 50# 15 reps for 5 seconds at the bend, full extension   Vaso Right Knee 10 minutes High 34* 10 minutes  HOME EXERCISE PROGRAM: Access Code: [email or web address] URL: [email or web address] Date: 06/29/2024 Prepared by: Grayce Spatz  Exercises - Seated Knee Flexion AAROM  - 10 x daily - 7 x weekly - 1 sets - 10 reps - 5 seconds hold - Supine Heel Slide with Strap  - 10 x daily - 7 x weekly - 1 sets - 10 reps - 5 seconds hold - Supine Quadricep Sets  - 10 x daily - 7 x weekly - 2 sets - 10 reps - 5 second hold - Seated Knee Extension AROM  - 10 x daily - 7 x weekly - 1 sets - 10 reps - 2 seconds hold - Standing Knee Flexion Stretch on Step  - 1 x daily - 7 x weekly - 3-5 sets - 3 reps - 10 seconds hold - Gastroc Stretch on Step  - 1-2 x daily - 7 x weekly - 1 sets - 3 reps - 30 seconds hold - Seated Table Hamstring Stretch  - 1-2 x daily - 7 x weekly - 1 sets - 3 reps - 30 seconds hold - Supine Quadriceps Stretch with Strap on Table  - 1-2 x daily - 7 x weekly - 1 sets - 3 reps - 30 seconds hold  ASSESSMENT:  CLINICAL IMPRESSION:  PROM is 0 - 4 - 98 degrees today, with flexion active range of motion at 88 degrees.  Flexion AROM remains priority #1 with both her home and clinic program.  [name] continues to be consistent with her HEP but is still somewhat pain-limited and the focus of both her home and clinic program will be range of motion post-manipulation.  OBJECTIVE IMPAIRMENTS: Abnormal gait, decreased activity tolerance,  decreased endurance, decreased mobility, difficulty walking, decreased ROM, decreased strength, increased edema, impaired perceived functional ability, and pain.   ACTIVITY LIMITATIONS: bending, sitting, standing, squatting, sleeping, stairs, dressing, and locomotion level  PARTICIPATION LIMITATIONS: cleaning, driving, and community activity  PERSONAL FACTORS: OA, asthma, Bipolar, DDD L4-S1, headache, HTN, Lt leg neuropathy, obesity, pre-diabetes, plantar fascia surgery, bilateral knee surgeries, spinal fusion, bilateral carpal tunnel are also affecting patient's functional outcome.   REHAB POTENTIAL: Good  CLINICAL DECISION MAKING: Evolving/moderate complexity  EVALUATION COMPLEXITY: Moderate   GOALS: Goals reviewed with patient? Yes  SHORT TERM GOALS: Target date: 07/14/2024 [name] will be independent with her day 1 HEP Baseline: Started 06/16/2024 Goal status: Met 07/08/2024  2.  Improve right knee AROM to 0 - 5 - 95 degrees Baseline: 0 - 13 - 70 degrees Goal status:   Met 07/20/2024  3.  Improve right quadriceps strength as assessed by functional self-report Baseline: 2+/5 Goal status: Ongoing  06/28/2024   LONG TERM GOALS: Target  date: 08/11/2024  Improve Patient Specific Functional Score to 5 Baseline: 0.6 Goal status: Ongoing  07/20/2024  2.  [name] will report right knee pain consistently 0-3/10 on the Visual Analog Scale Baseline: 7-10/10 Goal status: Ongoing  07/22/2024  3.  Improve right knee AROM to 0 - 2 - 110 degrees Baseline: 0 - 13 - 70 degrees Goal status: Ongoing  07/22/2024  4.  Improve right quadriceps strength to 4/5 MMT Baseline: 2+/5 MMT Goal status: Ongoing  06/28/2024  5.  [name] will be able to ambulate in the community without a noticeable limp due to her range deficit Baseline: Limited by a 13 degree extension deficit Goal status: Ongoing 07/22/2024  6.  [name] will be independent with her long-term maintenance HEP at DC Baseline: Started  06/16/2024 Goal status: Ongoing 07/22/2024   PLAN:  PT FREQUENCY: 3 x a week for 5-6 weeks  PT DURATION: 8 weeks  PLANNED INTERVENTIONS: 97750- Physical Performance Testing, 97110-Therapeutic exercises, 97530- Therapeutic activity, 97112- Neuromuscular re-education, 97535- Self Care, 02859- Manual therapy, [phone]- Gait training, [phone]- Electrical stimulation (unattended), 97016- Vasopneumatic device, 20560 (1-2 muscles), 20561 (3+ muscles)- Dry Needling, Patient/Family education, Balance training, Stair training, Joint mobilization, and Cryotherapy  PLAN FOR NEXT SESSION: Post-manipulation so heavy passive, active-assisted and active range of motion emphasis    Myer LELON Ivory, PT, MPT 07/22/2024, 4:28 PM  "

## 2024-07-25 ENCOUNTER — Ambulatory Visit: Payer: Medicare (Managed Care) | Admitting: Physician Assistant

## 2024-07-25 ENCOUNTER — Encounter: Payer: Self-pay | Admitting: Physician Assistant

## 2024-07-25 VITALS — BP 120/84 | HR 86 | Temp 97.3°F | Resp 16 | Ht 67.0 in | Wt 192.4 lb

## 2024-07-25 DIAGNOSIS — K219 Gastro-esophageal reflux disease without esophagitis: Secondary | ICD-10-CM | POA: Diagnosis not present

## 2024-07-25 DIAGNOSIS — L920 Granuloma annulare: Secondary | ICD-10-CM | POA: Diagnosis not present

## 2024-07-25 DIAGNOSIS — E782 Mixed hyperlipidemia: Secondary | ICD-10-CM

## 2024-07-25 DIAGNOSIS — M545 Low back pain, unspecified: Secondary | ICD-10-CM

## 2024-07-25 DIAGNOSIS — J4540 Moderate persistent asthma, uncomplicated: Secondary | ICD-10-CM

## 2024-07-25 DIAGNOSIS — Z23 Encounter for immunization: Secondary | ICD-10-CM | POA: Diagnosis not present

## 2024-07-25 DIAGNOSIS — K5909 Other constipation: Secondary | ICD-10-CM

## 2024-07-25 DIAGNOSIS — F5101 Primary insomnia: Secondary | ICD-10-CM | POA: Diagnosis not present

## 2024-07-25 DIAGNOSIS — F411 Generalized anxiety disorder: Secondary | ICD-10-CM | POA: Diagnosis not present

## 2024-07-25 DIAGNOSIS — F3177 Bipolar disorder, in partial remission, most recent episode mixed: Secondary | ICD-10-CM

## 2024-07-25 DIAGNOSIS — D508 Other iron deficiency anemias: Secondary | ICD-10-CM | POA: Diagnosis not present

## 2024-07-25 DIAGNOSIS — F410 Panic disorder [episodic paroxysmal anxiety] without agoraphobia: Secondary | ICD-10-CM

## 2024-07-25 DIAGNOSIS — G43711 Chronic migraine without aura, intractable, with status migrainosus: Secondary | ICD-10-CM | POA: Diagnosis not present

## 2024-07-25 NOTE — Progress Notes (Signed)
 "  New Patient Office Visit  Subjective:  Patient ID: [name], female    DOB: [birth year]  Age: 59 y.o. MRN: [id number]  CC:  Chief Complaint  Patient presents with   Medical Management of Chronic Issues    HPI  Pt  is currently following with [facility] and has been diagnosed with granuloma annulare.  She has started Humira therapy and states that is controlling her symptoms well  Pt has extensive list of medications and health issues Pt with moderate persistent asthma. Follows with pulmonologist Dr Nadyne.  Is using albuterol  and trelegy  Pt sees psychiatrist Dr Darden and has been diagnosed with bipolar disorder with depression, general anxiety disorder, ADD and insomnia.  Her current meds prescribed by Dr Darden for these problems include Xanax  XR 1mg  at bedtime, Xanax  1mg  prn, Wellbutrin  XL 150mg  2 po qd, lexapro  20mg  2 po qd, Lamictal  200mg  qhs, Methylphenidate  36mg , temazepam 30mg  qhs, trazodone  100mg  qhs.  Pt with chronic low back pain and has had 5 surgeries including spinal fusion.  Surgeries were done in 2004 and 2005.  She follows with [facility] monthly.  She is prescribed several medications through their office including dilaudid  4mg  qd,  narcan nasal spray,tizanadine 2mg  and xtampza   Pt with chronic migraines for the past 3 years.  She had been following with Dr Ines but since she left Dr Darden is prescribing her  Current medications prescribed for management include Ajovy  and nurtec  Pt with GERD - currently takes  protonix  40mg   Pt with chronic constipation secondary to pain medication - she currently takes  linzess  290, senokot,   Pt sees hematology for iron  def anemia - she missed her appt in December and has to schedule follow up appt  Pt would like flu shot and pneumonia shot today Past Medical History:  Diagnosis Date   Allergy    Anxiety    Arthritis    Asthma    History of Asthma   Bipolar disorder (HCC)    Chest pain,  atypical 11/30/2012   Clotting disorder    Depression    GERD (gastroesophageal reflux disease)    H/O degenerative disc disease    L4-L5, L5-S1   Headache    Hyperglycemia    Postoperative hyperglycemia   Hypertension    hx of  not on meds in 7 years   Neuromuscular disorder (HCC)    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   Obesity 07/14/2012   Pneumonia    Polycystic disease, ovaries    Pre-diabetes    Reflux    Sinus tachycardia 07/14/2012   Pt says HR > 100 is consistent    Past Surgical History:  Procedure Laterality Date   COLONOSCOPY     FOOT SURGERY Left    Right plantar facsiitis scrape   KNEE CLOSED REDUCTION Right 06/16/2024   Procedure: MANIPULATION, KNEE, CLOSED;  Surgeon: Vernetta Lonni GRADE, MD;  Location: [facility];  Service: Orthopedics;  Laterality: Right;   KNEE SURGERY Bilateral 2007   Tendon release   KNEE SURGERY Right 04/05/2024   Total R Knee Replacement   LAPAROSCOPIC GASTRIC SLEEVE RESECTION N/A 03/30/2017   Procedure: LAPAROSCOPIC GASTRIC SLEEVE RESECTION, UPPER ENDO;  Surgeon: Tanda Locus, MD;  Location: [facility];  Service: General;  Laterality: N/A;   NASAL SINUS SURGERY  2004   OVARIAN CYST REMOVAL  1998   A cyst on the fallopian tube removed   SPINAL FUSION  TONSILLECTOMY  1996   TOTAL KNEE ARTHROPLASTY Right 04/05/2024   Procedure: ARTHROPLASTY, KNEE, TOTAL;  Surgeon: Vernetta Lonni GRADE, MD;  Location: [facility];  Service: Orthopedics;  Laterality: Right;    Family History  Problem Relation Age of Onset   Cancer Father        Leukemia   Diabetes Father    Heart disease Father        first PCI in his 45s   Diabetes Mother    Cancer Maternal Grandmother        Stomach   Colon cancer Maternal Grandmother    Cancer Paternal Grandmother    Cancer Paternal Aunt    Diabetes Paternal Aunt    Cancer Paternal Uncle    Cancer Paternal Aunt    Rectal cancer Neg Hx    Stomach cancer Neg Hx    Esophageal cancer Neg Hx      Social History   Socioeconomic History   Marital status: Single    Spouse name: Not on file   Number of children: 0   Years of education: Not on file   Highest education level: Not on file  Occupational History   Occupation: disabled    Employer: UNEMPLOYED  Tobacco Use   Smoking status: Never   Smokeless tobacco: Never  Vaping Use   Vaping status: Never Used  Substance and Sexual Activity   Alcohol use: No   Drug use: No   Sexual activity: Yes    Birth control/protection: None  Other Topics Concern   Not on file  Social History Narrative   Not on file   Social Drivers of Health   Tobacco Use: Low Risk (07/25/2024)   Patient History    Smoking Tobacco Use: Never    Smokeless Tobacco Use: Never    Passive Exposure: Not on file  Financial Resource Strain: Low Risk (08/12/2023)   Overall Financial Resource Strain (CARDIA)    Difficulty of Paying Living Expenses: Not hard at all  Food Insecurity: Low Risk (07/21/2024)   Received from [facility]   Epic    Within the past 12 months, you worried that your food would run out before you got money to buy more: Never true    Within the past 12 months, the food you bought just didn't last and you didn't have money to get more. : Never true  Transportation Needs: No Transportation Needs (07/21/2024)   Received from [facility]    In the past 12 months, has lack of reliable transportation kept you from medical appointments, meetings, work or from getting things needed for daily living? : No  Physical Activity: Inactive (10/28/2023)   Exercise Vital Sign    Days of Exercise per Week: 0 days    Minutes of Exercise per Session: 0 min  Stress: Stress Concern Present (08/12/2023)   [employer] of Occupational Health - Occupational Stress Questionnaire    Feeling of Stress : To some extent  Social Connections: Moderately Isolated (08/12/2023)   Social Connection and Isolation Panel    Frequency of  Communication with Friends and Family: More than three times a week    Frequency of Social Gatherings with Friends and Family: Once a week    Attends Religious Services: Never    Database Administrator or Organizations: No    Attends Banker Meetings: Never    Marital Status: Married  Catering Manager Violence: Not At Risk (04/06/2024)   Epic  Fear of Current or Ex-Partner: No    Emotionally Abused: No    Physically Abused: No    Sexually Abused: No  Depression (PHQ2-9): Medium Risk (01/18/2024)   Depression (PHQ2-9)    PHQ-2 Score: 7  Alcohol Screen: Low Risk (08/12/2023)   Alcohol Screen    Last Alcohol Screening Score (AUDIT): 0  Housing: Low Risk (07/21/2024)   Received from [facility]   Epic    What is your living situation today?: I have a steady place to live    Think about the place you live. Do you have problems with any of the following? Choose all that apply:: Not on file  Utilities: Low Risk (07/21/2024)   Received from [facility]   Utilities    In the past 12 months has the electric, gas, oil, or water  company threatened to shut off services in your home? : No  Health Literacy: Adequate Health Literacy (08/12/2023)   B1300 Health Literacy    Frequency of need for help with medical instructions: Never     Current Outpatient Medications:    albuterol  (VENTOLIN  HFA) 108 (90 Base) MCG/ACT inhaler, Inhale 2 puffs into the lungs every 4 (four) hours as needed for wheezing or shortness of breath., Disp: 1 each, Rfl: 3   ALPRAZolam  (XANAX  XR) 1 MG 24 hr tablet, Take 1 mg by mouth at bedtime., Disp: , Rfl:    ALPRAZolam  (XANAX ) 1 MG tablet, Take 1 mg by mouth 3 (three) times daily as needed for anxiety or sleep., Disp: , Rfl:    Biotin  2500 MCG CAPS, Take 2 each by mouth daily in the afternoon., Disp: , Rfl:    buPROPion  (WELLBUTRIN  XL) 150 MG 24 hr tablet, Take 150-300 mg by mouth daily. Take 300mg  (2 tablets) by mouth in the morning and 150mg  (1 tablet) in the  evening., Disp: , Rfl:    CALCIUM  PO, Take 1 tablet by mouth daily., Disp: , Rfl:    cefdinir (OMNICEF) 300 MG capsule, Take 300 mg by mouth., Disp: , Rfl:    chlorhexidine  (HIBICLENS ) 4 % external liquid, Apply 15 mLs (1 Application total) topically as directed for 30 doses. Use as directed daily for 5 days every other week for 6 weeks. (Patient taking differently: Apply 1 Application topically daily as needed.), Disp: 946 mL, Rfl: 1   Docusate Sodium  (DSS) 100 MG CAPS, Take 100 mg by mouth daily as needed (CONSTIPATION)., Disp: , Rfl:    EPINEPHrine  0.3 mg/0.3 mL IJ SOAJ injection, Inject 0.3 mg into the muscle as needed for anaphylaxis., Disp: 1 each, Rfl: 1   escitalopram  (LEXAPRO ) 20 MG tablet, Take 1 tablet (20 mg total) by mouth daily with breakfast. (Patient taking differently: Take 40 mg by mouth daily with breakfast.), Disp: 30 tablet, Rfl: 3   estradiol  (ESTRACE ) 1 MG tablet, Take 1 mg by mouth daily., Disp: , Rfl:    FIBER PO, Take 2 each by mouth daily., Disp: , Rfl:    fluticasone  (FLONASE ) 50 MCG/ACT nasal spray, Place 2 sprays into both nostrils daily., Disp: 16 g, Rfl: 6   Fluticasone -Umeclidin-Vilant (TRELEGY ELLIPTA) 200-62.5-25 MCG/ACT AEPB, Inhale 2 puffs into the lungs daily as needed (Asthma)., Disp: , Rfl:    Fremanezumab -vfrm (AJOVY ) 225 MG/1.5ML SOAJ, Inject 225 mg into the skin every 30 (thirty) days., Disp: 1.5 mL, Rfl: 11   HUMIRA, 2 PEN, 40 MG/0.4ML pen, Inject 40 mg into the skin every 14 (fourteen) days., Disp: , Rfl:    HYDROmorphone  (DILAUDID )  4 MG tablet, Take 1 tablet (4 mg total) by mouth every 6 (six) hours as needed for severe pain (pain score 7-10)., Disp: 30 tablet, Rfl: 0   lamoTRIgine  (LAMICTAL ) 200 MG tablet, Take 400 mg by mouth at bedtime., Disp: , Rfl:    linaclotide  (LINZESS ) 290 MCG CAPS capsule, Take 290 mcg by mouth daily before breakfast., Disp: , Rfl:    MAGNESIUM COMPLEX PO, Take 1 tablet by mouth daily., Disp: , Rfl:    medroxyPROGESTERone   (PROVERA ) 2.5 MG tablet, Take 1 tablet (2.5 mg total) by mouth daily., Disp: 90 tablet, Rfl: 5   Melatonin 5 MG CHEW, Chew 20 mg by mouth at bedtime., Disp: , Rfl:    methylphenidate  36 MG PO CR tablet, Take 36 mg by mouth daily as needed (ADHD)., Disp: , Rfl:    Multiple Vitamins-Minerals (MULTIVITAMIN GUMMIES WOMENS PO), Take 2 each by mouth daily in the afternoon., Disp: , Rfl:    NURTEC 75 MG TBDP, Take by mouth., Disp: , Rfl:    pantoprazole  (PROTONIX ) 40 MG tablet, Take 1 tablet (40 mg total) by mouth daily. Office visit for further refills, Disp: 90 tablet, Rfl: 0   senna (SENOKOT) 8.6 MG tablet, Take 2 tablets by mouth at bedtime as needed (CONSTIPATION)., Disp: , Rfl:    temazepam (RESTORIL) 30 MG capsule, Take 30 mg by mouth at bedtime., Disp: , Rfl:    tiZANidine  (ZANAFLEX ) 2 MG tablet, Take 2 mg by mouth at bedtime as needed for muscle spasms., Disp: , Rfl:    traZODone  (DESYREL ) 100 MG tablet, Take 200 mg by mouth at bedtime as needed for sleep., Disp: , Rfl:    valACYclovir  (VALTREX ) 1000 MG tablet, TAKE 1 TABLET (1,000 MG TOTAL) BY MOUTH DAILY., Disp: 90 tablet, Rfl: 0   Vitamin D-Vitamin K (D3 + K2 PO), Take 1 tablet by mouth daily., Disp: , Rfl:    XTAMPZA  ER 9 MG C12A, Take 1 capsule by mouth every 12 (twelve) hours., Disp: , Rfl:    naloxone (NARCAN) nasal spray 4 mg/0.1 mL, Place 1 spray into the nose once., Disp: , Rfl:   Allergies  Allergen Reactions   Carbamazepine Swelling    Rash and tongue swelling   Duloxetine Other (See Comments) and Itching    Confusion/dizziness  Unknown reaction    Unknown reaction Unknown reaction    Confusion/dizziness   Erythromycin Swelling, Nausea And Vomiting and Other (See Comments)   Erythromycin Base Swelling   Gabapentin Nausea And Vomiting and Anaphylaxis   Pineapple Shortness Of Breath and Swelling    Throat swells   Pregabalin Swelling   Shellfish Protein-Containing Drug Products Anaphylaxis   Sulfa  Antibiotics Hives,  Shortness Of Breath, Itching, Anxiety and Palpitations   Bee Pollen Other (See Comments)    Stuffy  Nose   Pollen Extract Other (See Comments)    Stuffy  Nose   Peanut (Diagnostic) Rash   CONSTITUTIONAL: Negative for chills, fatigue, fever, unintentional weight gain and unintentional weight loss.  E/N/T: Negative for ear pain, nasal congestion and sore throat.  CARDIOVASCULAR: Negative for chest pain, dizziness, palpitations and pedal edema.  RESPIRATORY: Negative for recent cough and dyspnea.  GASTROINTESTINAL: Negative for abdominal pain, acid reflux symptoms, constipation, diarrhea, nausea and vomiting.  MSK: Negative for arthralgias and myalgias.  INTEGUMENTARY: Negative for rash.  NEUROLOGICAL: Negative for dizziness and headaches.  PSYCHIATRIC: Negative for sleep disturbance and to question depression screen.  Negative for depression, negative for anhedonia.  Objective:  PHYSICAL EXAM:   VS: BP 120/84   Pulse 86   Temp (!) 97.3 F (36.3 C) (Temporal)   Resp 16   Ht 5' 7 (1.702 m)   Wt 192 lb 6.4 oz (87.3 kg)   LMP 08/22/2016   SpO2 98%   BMI 30.13 kg/m   GEN: Well nourished, well developed, in no acute distress   Cardiac: RRR; no murmurs, rubs, or gallops,no edema -  Respiratory:  normal respiratory rate and pattern with no distress - normal breath sounds with no rales, rhonchi, wheezes or rubs MS: no deformity or atrophy  Skin: warm and dry, no rash  Neuro:  Alert and Oriented x 3,  - CN II-Xii grossly intact Psych: euthymic mood, appropriate affect and demeanor    Health Maintenance  Topic Date Due   Hepatitis C Screening  08/11/2024*   Hepatitis B Vaccine (1 of 3 - 19+ 3-dose series) 01/17/2025*   Breast Cancer Screening  10/06/2024   Medicare Annual Wellness Visit  10/27/2024   Pap with HPV screening  07/09/2026   Colon Cancer Screening  09/07/2028   DTaP/Tdap/Td vaccine (4 - Td or Tdap) 05/15/2029   Pneumococcal Vaccine for age over 51   Completed   Flu Shot  Completed   HPV Vaccine (No Doses Required) Completed   HIV Screening  Completed   Zoster (Shingles) Vaccine  Completed   Meningitis B Vaccine  Aged Out   COVID-19 Vaccine  Discontinued   Cologuard (Stool DNA test)  Discontinued  *Topic was postponed. The date shown is not the original due date.    Lab Results  Component Value Date   TSH 1.160 01/18/2024   Lab Results  Component Value Date   WBC 6.5 06/16/2024   HGB 13.5 06/16/2024   HCT 41.8 06/16/2024   MCV 89.5 06/16/2024   PLT 260 06/16/2024   Lab Results  Component Value Date   NA 139 06/16/2024   K 3.7 06/16/2024   CO2 27 06/16/2024   GLUCOSE 65 (L) 06/16/2024   BUN 10 06/16/2024   CREATININE 0.58 06/16/2024   BILITOT 0.6 01/18/2024   ALKPHOS 78 01/18/2024   AST 24 01/18/2024   ALT 15 01/18/2024   PROT 7.2 01/18/2024   ALBUMIN 4.9 01/18/2024   CALCIUM  9.2 06/16/2024   ANIONGAP 9 06/16/2024   EGFR 73 01/18/2024   GFR 66.55 11/10/2022   Lab Results  Component Value Date   CHOL 220 (H) 05/16/2019   Lab Results  Component Value Date   HDL 61 05/16/2019   Lab Results  Component Value Date   LDLCALC 143 (H) 05/16/2019   Lab Results  Component Value Date   TRIG 65 05/16/2019   Lab Results  Component Value Date   CHOLHDL 3.6 05/16/2019   Lab Results  Component Value Date   HGBA1C 4.8 03/25/2024      Assessment & Plan:  Granuloma annulare Follow up with dermatology as directed Chronic migraine without aura, with intractable migraine, so stated, with status migrainosus Continue meds Follow up for management with neurologist Bipolar disorder, in partial remission, most recent episode mixed (HCC) Continue meds Follow up with psychiatry for medication management Moderate persistent asthma without complication Continue inhalers Refill albuterol  given Follow up with pulmonologist as scheduled Generalized anxiety disorder with panic attacks Follow up with psych for med  management Primary insomnia Follow up with psych for med management Chronic constipation Continue meds Gastroesophageal reflux disease without esophagitis Continue meds Chronic low back pain,  unspecified back pain laterality, unspecified whether sciatica present Follow up with pain management for medication management Iron  def anemia Labwork pending  Follow up with hematologist as directed Hemoccult cards given Immunizations due Pneumo 20 and flublok given Hyperlipidemia Watch diet Labwork pending  Follow-up: Return in about 6 months (around 01/22/2025) for chronic fasting follow-up.    SARA R Kaelea Gathright, PA-C "

## 2024-07-26 ENCOUNTER — Ambulatory Visit: Payer: Self-pay

## 2024-07-26 ENCOUNTER — Encounter: Admitting: Physical Therapy

## 2024-07-26 ENCOUNTER — Ambulatory Visit: Payer: Self-pay | Admitting: Physician Assistant

## 2024-07-26 LAB — COMPREHENSIVE METABOLIC PANEL WITH GFR
ALT: 10 IU/L (ref 0–32)
AST: 18 IU/L (ref 0–40)
Albumin: 4.7 g/dL (ref 3.8–4.9)
Alkaline Phosphatase: 88 IU/L (ref 49–135)
BUN/Creatinine Ratio: 7 — ABNORMAL LOW (ref 9–23)
BUN: 6 mg/dL (ref 6–24)
Bilirubin Total: 0.5 mg/dL (ref 0.0–1.2)
CO2: 25 mmol/L (ref 20–29)
Calcium: 9.7 mg/dL (ref 8.7–10.2)
Chloride: 102 mmol/L (ref 96–106)
Creatinine, Ser: 0.82 mg/dL (ref 0.57–1.00)
Globulin, Total: 2.4 g/dL (ref 1.5–4.5)
Glucose: 87 mg/dL (ref 70–99)
Potassium: 4.7 mmol/L (ref 3.5–5.2)
Sodium: 141 mmol/L (ref 134–144)
Total Protein: 7.1 g/dL (ref 6.0–8.5)
eGFR: 83 mL/min/1.73

## 2024-07-26 LAB — FE+CBC/D/PLT+TIBC+FER+RETIC
Basophils Absolute: 0.1 x10E3/uL (ref 0.0–0.2)
Basos: 1 %
EOS (ABSOLUTE): 0.6 x10E3/uL — ABNORMAL HIGH (ref 0.0–0.4)
Eos: 9 %
Ferritin: 90 ng/mL (ref 15–150)
Hematocrit: 45.2 % (ref 34.0–46.6)
Hemoglobin: 14.4 g/dL (ref 11.1–15.9)
Immature Grans (Abs): 0 x10E3/uL (ref 0.0–0.1)
Immature Granulocytes: 0 %
Iron Saturation: 26 % (ref 15–55)
Iron: 76 ug/dL (ref 27–159)
Lymphocytes Absolute: 0.8 x10E3/uL (ref 0.7–3.1)
Lymphs: 13 %
MCH: 28.7 pg (ref 26.6–33.0)
MCHC: 31.9 g/dL (ref 31.5–35.7)
MCV: 90 fL (ref 79–97)
Monocytes Absolute: 0.7 x10E3/uL (ref 0.1–0.9)
Monocytes: 11 %
Neutrophils Absolute: 3.9 x10E3/uL (ref 1.4–7.0)
Neutrophils: 66 %
Platelets: 247 x10E3/uL (ref 150–450)
RBC: 5.01 x10E6/uL (ref 3.77–5.28)
RDW: 14 % (ref 11.7–15.4)
Retic Ct Pct: 0.7 % (ref 0.6–2.6)
Total Iron Binding Capacity: 290 ug/dL (ref 250–450)
UIBC: 214 ug/dL (ref 131–425)
WBC: 6 x10E3/uL (ref 3.4–10.8)

## 2024-07-26 LAB — LIPID PANEL
Chol/HDL Ratio: 2.4 ratio (ref 0.0–4.4)
Cholesterol, Total: 192 mg/dL (ref 100–199)
HDL: 81 mg/dL
LDL Chol Calc (NIH): 99 mg/dL (ref 0–99)
Triglycerides: 63 mg/dL (ref 0–149)
VLDL Cholesterol Cal: 12 mg/dL (ref 5–40)

## 2024-07-26 LAB — TSH: TSH: 0.908 u[IU]/mL (ref 0.450–4.500)

## 2024-07-26 NOTE — Telephone Encounter (Signed)
 FYI Only or Action Required?: FYI only for provider: Home care.  Patient was last seen in primary care on 07/25/2024 by Nicholaus Credit, PA-C.  Called Nurse Triage reporting Flu Vaccine.  Symptoms began yesterday.  Interventions attempted: OTC medications: Ibuprofen, Alka-seltzer daytime and Rest, hydration, or home remedies.  Symptoms are: stable.  Triage Disposition: Home Care  Patient/caregiver understands and will follow disposition?: Yes  Reason for Disposition  Nursing judgment or information in reference  Answer Assessment - Initial Assessment Questions Pt calling in to request Tamiflu. Educated patient on post-vaccine immune responses, which she is likely experiencing, not an infection. Tamiflu would likely not be indicated. Advised to keep taking Ibuprofen, Alka-seltzer day time and drink plenty of fluids and I will make PCP aware of symptoms.    1. REASON FOR CALL: What is your main concern right now?     Received Pneumo 20 and flublok yesterday and is now experiencing symptoms  2. ONSET: When did the symptoms start?     Last night  3. FEVER: Do you have a fever?     100.3 Orally at 12pm. 99.8 orally at 1:35 pm.    4. OTHER SYMPTOMS: Do you have any other new symptoms?     Sore throat, congestion, productive cough, fatigue. Denies SOB, chest pain. Pt has asthma, uses inhalers, denies wheezing.   5. TREATMENTS AND RESPONSE: What have you done so far to try to make this better? What medicines have you used?     Alka-seltzer daytime, Ibuprofen  Protocols used: No Guideline Available-A-AH  Copied from CRM #[id number]. Topic: Clinical - Medical Advice >> Jul 26, 2024 12:52 PM Larissa S wrote: Reason for CRM: Patient states she is experiencing the following symptoms after receiving the flu shot: Fever-100.3, cough, sore throat. Requesting a prescription for tamiflu.   [facility] - [place], [place] - [address] [place] [address] Phone: [phone] Fax: [phone] Hours: Not open 24 hours

## 2024-07-27 ENCOUNTER — Encounter: Admitting: Rehabilitative and Restorative Service Providers"

## 2024-07-27 ENCOUNTER — Encounter

## 2024-07-29 ENCOUNTER — Encounter: Admitting: Rehabilitative and Restorative Service Providers"

## 2024-08-04 ENCOUNTER — Encounter: Payer: Self-pay | Admitting: Rehabilitative and Restorative Service Providers"

## 2024-08-04 ENCOUNTER — Ambulatory Visit: Payer: Medicare (Managed Care) | Admitting: Rehabilitative and Restorative Service Providers"

## 2024-08-04 DIAGNOSIS — R600 Localized edema: Secondary | ICD-10-CM

## 2024-08-04 DIAGNOSIS — M25561 Pain in right knee: Secondary | ICD-10-CM

## 2024-08-04 DIAGNOSIS — M25661 Stiffness of right knee, not elsewhere classified: Secondary | ICD-10-CM | POA: Diagnosis not present

## 2024-08-04 DIAGNOSIS — M6281 Muscle weakness (generalized): Secondary | ICD-10-CM

## 2024-08-04 DIAGNOSIS — R262 Difficulty in walking, not elsewhere classified: Secondary | ICD-10-CM

## 2024-08-04 DIAGNOSIS — G8929 Other chronic pain: Secondary | ICD-10-CM | POA: Diagnosis not present

## 2024-08-04 NOTE — Therapy (Signed)
 " OUTPATIENT PHYSICAL THERAPY LOWER EXTREMITY TREATMENT   Patient Name: [name] MRN: [id number] DOB:1966-05-17, 58 y.o., female Today's Date: 08/04/2024  END OF SESSION:  PT End of Session - 08/04/24 1607     Visit Number 17    Number of Visits 28    Date for Recertification  08/11/24    Authorization Type [employer] MEDICARE    PT Start Time 1604    PT Stop Time 1650    PT Time Calculation (min) 46 min    Activity Tolerance Patient tolerated treatment well;No increased pain;Patient limited by pain    Behavior During Therapy [facility] for tasks assessed/performed                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   Obesity 07/14/2012   Pneumonia    Polycystic disease, ovaries    Pre-diabetes    Reflux    Sinus tachycardia 07/14/2012   Pt says HR > 100 is consistent   Past Surgical History:  Procedure Laterality Date   COLONOSCOPY     FOOT SURGERY Left    Right plantar facsiitis scrape   KNEE CLOSED REDUCTION Right 06/16/2024   Procedure: MANIPULATION, KNEE, CLOSED;  Surgeon: Vernetta Lonni GRADE, MD;  Location: [facility];  Service: Orthopedics;  Laterality: Right;   KNEE SURGERY Bilateral 2007   Tendon release   KNEE SURGERY Right 04/05/2024   Total R Knee Replacement   LAPAROSCOPIC GASTRIC SLEEVE RESECTION N/A 03/30/2017   Procedure: LAPAROSCOPIC GASTRIC SLEEVE RESECTION, UPPER ENDO;  Surgeon: Tanda Locus, MD;  Location: [facility];  Service: General;  Laterality: N/A;   NASAL SINUS SURGERY  2004   OVARIAN CYST REMOVAL   1998   A cyst on the fallopian tube removed   SPINAL FUSION     TONSILLECTOMY  1996   TOTAL KNEE ARTHROPLASTY Right 04/05/2024   Procedure: ARTHROPLASTY, KNEE, TOTAL;  Surgeon: Vernetta Lonni GRADE, MD;  Location: [facility];  Service: Orthopedics;  Laterality: Right;   Patient Active Problem List   Diagnosis Date Noted   Arthrofibrosis of knee joint, right 06/15/2024   Status post total right knee replacement 04/05/2024   Actinic keratosis 01/18/2024   Absolute anemia 01/18/2024   Pruritus 01/18/2024   Bilateral carpal tunnel syndrome 11/25/2023   History of sleeve gastrectomy 11/25/2023   Generalized anxiety disorder with panic attacks 08/12/2023   Primary insomnia 08/12/2023   Chronic constipation 08/12/2023   Contusion of nose 04/23/2023   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   Chronic migraine without aura, with intractable migraine, so stated, with status migrainosus 01/25/2023   History of psychiatric treatment 01/13/2023   Viral upper respiratory tract infection 03/28/2021   Episodic tension-type headache, not intractable 11/07/2020   Hypertrophy of inferior nasal turbinate 11/07/2020   Postnasal drip 11/07/2020   Lymphadenopathy, anterior cervical 07/13/2020   Sebaceous cyst 07/13/2020   Iron  deficiency anemia secondary to  inadequate dietary iron  intake 01/12/2020   Constipation 12/13/2019   Hyperlipidemia LDL goal <130 09/08/2019   Positive colorectal cancer screening using Cologuard test 03/02/2018   Leukocytosis 01/26/2018   Onychomycosis of great toe 12/22/2017   Family history of DVT 03/20/2017   Unilateral primary osteoarthritis, right knee 05/13/2016   Dyslipidemia 03/04/2016   Right knee pain 03/03/2016   Pre-diabetes 11/01/2015   Allergic rhinitis 05/28/2015   Sleep state misperception 07/05/2014   BMI 33.0-33.9,adult 05/24/2013   Polycystic ovary disease 07/14/2012   Obstructive sleep apnea (adult) (pediatric) 02/04/2012   Perimenopausal 11/10/2011   Hot  flashes 11/10/2011   Disturbance in sleep behavior 09/16/2011   Asthma 05/23/2011   Backache 05/23/2011   Bipolar disorder in partial remission 05/23/2011   Dysphagia 05/23/2011   Esophageal reflux 05/23/2011   Rhinitis, allergic 05/23/2011    PCP: Camie Moats, PA-C  REFERRING PROVIDER: Lonni CINDERELLA Poli, MD  REFERRING DIAG:  [phone] (ICD-10-CM) - Status post total right knee replacement  Post-manipulation this morning  THERAPY DIAG:  Difficulty in walking, not elsewhere classified  Muscle weakness (generalized)  Localized edema  Chronic pain of right knee  Stiffness of right knee, not elsewhere classified  Rationale for Evaluation and Treatment: Rehabilitation  ONSET DATE: Manipulation 06/16/2024, TKA 04/05/2024  SUBJECTIVE:   SUBJECTIVE STATEMENT: [name] has been limited by an illness and by the winter weather with her home and clinic participation over the last almost 2 weeks.  PERTINENT HISTORY: OA, asthma, Bipolar, DDD L4-S1, headache, HTN, Lt leg neuropathy, obesity, pre-diabetes, plantar fascia surgery, bilateral knee surgeries, spinal fusion, bilateral carpal tunnel  [name] had her right knee replaced 04/05/2024.  She only had home health PT due to difficulty getting to outpatient PT.  Her manipulation was this morning and she is motivated to fix her right knee.  PAIN:  Are you having pain?  Yes: NPRS scale: Remains 4-8/10 this week Pain location: Rt knee Pain description: Stiff, achy Aggravating factors: Bending, walking, in and out of the car Relieving factors: Ice, meds PRECAUTIONS: Back  RED FLAGS: None   WEIGHT BEARING RESTRICTIONS: No  FALLS:  Has patient fallen in last 6 months? No  LIVING ENVIRONMENT: Lives with: lives with their family, lives with their spouse, and mom Lives in: House/apartment Stairs: Difficulty with stairs Has following equipment at home: None  OCCUPATION: Disbailty  PLOF: Needs assistance with ADLs  PATIENT  GOALS: Bend the knee normally  NEXT MD VISIT: 09/28/2024  OBJECTIVE:  Note: Objective measures were completed at Evaluation unless otherwise noted.  DIAGNOSTIC FINDINGS: Right knee arthroplasty without immediate postoperative complication.   PATIENT SURVEYS:  PSFS: THE PATIENT SPECIFIC FUNCTIONAL SCALE  Place score of 0-10 (0 = unable to perform activity and 10 = able to perform activity at the same level as before injury or problem)  Activity Date: 06/16/2024 06/28/24 07/21/2023  ADLS including get in/out of a vehicle; clothes out of the dryer 1 x 3  6 7   2.  Sleep 0 x 2 1 2.5  3. Walk including on uneven ground, ramps, curbs  1 5  4. stairs  1 3  Total Score 0.06 2.25 4.38    Total Score = Sum of activity scores/number of activities  Minimally Detectable Change: 3 points (for single activity); 2 points (for average score)  [name] (nd). The Patient Specific Functional Scale . Retrieved from [email or web address]   COGNITION: Overall cognitive status: Within functional limits for tasks assessed    SENSATION: No new  tingling or paresthesias  EDEMA:  Noted and not objectively assessed  LOWER EXTREMITY ROM:   AROM Left/Right  06/16/24 Right  06/17/24 Right  06/20/24 Right 06/23/24 Rt:  06/24/24 Rt  06/27/24 Right 06/28/24 Right 07/06/24  Right 07/08/2024 Right 07/14/2024 Right 07/20/2024 Right 07/22/2024 Right 08/04/2024  Hip flexion               Hip extension               Hip abduction               Hip adduction               Hip internal rotation               Hip external rotation               Knee flexion 131/70 (Right 95 post exercises) 98 with PT assist Supine  80A 95 P Supine  84 A Supine AROM 84deg Supine AAROM 72  Seated P: 84* Supine 74A Passive 82 Passive 88 Active 84 Passive 95   Active 88 Passive 98 Active 80 Passive 93  Knee extension 0/-13 (Right -7 post-exercises) -7 active        -5 active -5 active - 5 Active -4 after exercise -4 after exercise  Ankle dorsiflexion               Ankle plantarflexion               Ankle inversion               Ankle eversion                (Blank rows = not tested)  LOWER EXTREMITY STRENGTH:  MMT Left/Right  06/16/24   Hip flexion    Hip extension    Hip abduction    Hip adduction    Hip internal rotation    Hip external rotation    Knee flexion    Knee extension 4/2+   Ankle dorsiflexion    Ankle plantarflexion    Ankle inversion    Ankle eversion     (Blank rows = not tested)  GAIT: Distance walked: 50 feet Assistive device utilized: None Level of assistance: Complete Independence Comments: Ingri has limited WB endurance and walks with a very stiff gait, unable to fully extend her right knee    TREATMENT DATE: Rt TKA 08/04/2024 Recumbent bike Seat 7 for 5 minutes active assisted range of motion, without a foot strap on the right Seated knee flexion AAROM (left pushes right into flexion) 4 sets of 5 x 10 seconds (All sets with PT assist) Quad sets with right heel prop & PT overpressure 2 sets of 10 x 5 seconds  Manual: PT overpressure with seated knee flexion activities and light overpressure with quadriceps into extension  Functional Activities: Double Leg Press 125# 25 reps for 5 seconds (with bounce) the bend, full extension for sit to stand, stairs, curbs Single Leg Press 68# 15 reps for 5 seconds at the bend, full extension for sit to stand, stairs, curbs   07/22/2024 Recumbent bike Seat 7 for 5 minutes active assisted range of motion, remove foot strap on the right Seated knee flexion AAROM (left pushes right into flexion) 2 sets of 10 x 10 seconds (All sets with PT assist) Quad sets with right heel prop & PT overpressure 20 x 5 seconds  Functional Activities: Double Leg Press 125# (150#  next visit) 25 reps for 5 seconds (with bounce) the bend, full extension for sit to stand, stairs, curbs Single  Leg Press 75# 15 reps for 5 seconds at the bend, full extension for sit to stand, stairs, curbs  Vaso Right Knee 10 minutes High 34* 10 minutes    07/20/2024 Recumbent bike Seat 7 for 6 minutes active assisted range of motion, remove foot strap on the right Seated knee flexion AAROM (left pushes right into flexion) 4 sets of 5 x 10 seconds (All 4 sets with PT assist) Quad sets with right heel prop & PT overpressure 2 sets of 10 x 5 seconds  Functional Activities: Double Leg Press 125# 25 reps for 5 seconds at the bend, full extension for sit to stand, stairs, curbs Single Leg Press 50# 15 reps for 5 seconds at the bend, full extension for sit to stand, stairs, curbs  Vaso Right Knee 10 minutes High 34* 10 minutes  HOME EXERCISE PROGRAM: Access Code: [email or web address] URL: [email or web address] Date: 06/29/2024 Prepared by: Grayce Spatz  Exercises - Seated Knee Flexion AAROM  - 10 x daily - 7 x weekly - 1 sets - 10 reps - 5 seconds hold - Supine Heel Slide with Strap  - 10 x daily - 7 x weekly - 1 sets - 10 reps - 5 seconds hold - Supine Quadricep Sets  - 10 x daily - 7 x weekly - 2 sets - 10 reps - 5 second hold - Seated Knee Extension AROM  - 10 x daily - 7 x weekly - 1 sets - 10 reps - 2 seconds hold - Standing Knee Flexion Stretch on Step  - 1 x daily - 7 x weekly - 3-5 sets - 3 reps - 10 seconds hold - Gastroc Stretch on Step  - 1-2 x daily - 7 x weekly - 1 sets - 3 reps - 30 seconds hold - Seated Table Hamstring Stretch  - 1-2 x daily - 7 x weekly - 1 sets - 3 reps - 30 seconds hold - Supine Quadriceps Stretch with Strap on Table  - 1-2 x daily - 7 x weekly - 1 sets - 3 reps - 30 seconds hold  ASSESSMENT:  CLINICAL IMPRESSION:  PROM is 0 - 4 - 93 degrees today, with flexion active range of motion at 80 degrees.  This is noticeably stiffer than her last visit almost 2 weeks ago.  As mentioned in the subjective section, [name] has been sick and has been unable to attend  supervised physical therapy for almost 2 weeks due to the winter weather.  Her objective active range of motion measurements reflect this.  I encouraged [name] to get back to her previous level of home exercise program compliance now that she is over her cold with the hopes that we can meet long-term goals of at least 0 - 3 -90 degrees.  OBJECTIVE IMPAIRMENTS: Abnormal gait, decreased activity tolerance, decreased endurance, decreased mobility, difficulty walking, decreased ROM, decreased strength, increased edema, impaired perceived functional ability, and pain.   ACTIVITY LIMITATIONS: bending, sitting, standing, squatting, sleeping, stairs, dressing, and locomotion level  PARTICIPATION LIMITATIONS: cleaning, driving, and community activity  PERSONAL FACTORS: OA, asthma, Bipolar, DDD L4-S1, headache, HTN, Lt leg neuropathy, obesity, pre-diabetes, plantar fascia surgery, bilateral knee surgeries, spinal fusion, bilateral carpal tunnel are also affecting patient's functional outcome.   REHAB POTENTIAL: Good  CLINICAL DECISION MAKING: Evolving/moderate complexity  EVALUATION COMPLEXITY: Moderate   GOALS: Goals reviewed with patient? Yes  SHORT TERM  GOALS: Target date: 07/14/2024 [name] will be independent with her day 1 HEP Baseline: Started 06/16/2024 Goal status: Met 07/08/2024  2.  Improve right knee AROM to 0 - 5 - 95 degrees Baseline: 0 - 13 - 70 degrees Goal status:   Met 07/20/2024  3.  Improve right quadriceps strength as assessed by functional self-report Baseline: 2+/5 Goal status: Ongoing  06/28/2024   LONG TERM GOALS: Target date: 08/11/2024  Improve Patient Specific Functional Score to 5 Baseline: 0.6 Goal status: Ongoing  07/20/2024  2.  [name] will report right knee pain consistently 0-3/10 on the Visual Analog Scale Baseline: 7-10/10 Goal status: Ongoing  08/04/2024  3.  Improve right knee AROM to 0 - 2 - 110 degrees Baseline: 0 - 13 - 70 degrees Goal status: Ongoing   08/04/2024  4.  Improve right quadriceps strength to 4/5 MMT Baseline: 2+/5 MMT Goal status: Ongoing  06/28/2024  5.  [name] will be able to ambulate in the community without a noticeable limp due to her range deficit Baseline: Limited by a 13 degree extension deficit Goal status: Ongoing 08/04/2024  6.  [name] will be independent with her long-term maintenance HEP at DC Baseline: Started 06/16/2024 Goal status: Ongoing 08/04/2024   PLAN:  PT FREQUENCY: 3 x a week for 5-6 weeks  PT DURATION: Up to 08/11/2024  PLANNED INTERVENTIONS: 97750- Physical Performance Testing, 97110-Therapeutic exercises, 97530- Therapeutic activity, 97112- Neuromuscular re-education, 97535- Self Care, 02859- Manual therapy, [phone]- Gait training, [phone]- Electrical stimulation (unattended), 97016- Vasopneumatic device, 20560 (1-2 muscles), 20561 (3+ muscles)- Dry Needling, Patient/Family education, Balance training, Stair training, Joint mobilization, and Cryotherapy  PLAN FOR NEXT SESSION: Post-manipulation so heavy passive, active-assisted and active range of motion emphasis    [name], PT, MPT 08/04/2024, 5:07 PM  "

## 2024-08-05 ENCOUNTER — Encounter: Payer: Self-pay | Admitting: Rehabilitative and Restorative Service Providers"

## 2024-08-05 ENCOUNTER — Ambulatory Visit: Payer: Medicare (Managed Care) | Admitting: Rehabilitative and Restorative Service Providers"

## 2024-08-05 DIAGNOSIS — G8929 Other chronic pain: Secondary | ICD-10-CM | POA: Diagnosis not present

## 2024-08-05 DIAGNOSIS — M25561 Pain in right knee: Secondary | ICD-10-CM | POA: Diagnosis not present

## 2024-08-05 DIAGNOSIS — M6281 Muscle weakness (generalized): Secondary | ICD-10-CM

## 2024-08-05 DIAGNOSIS — R600 Localized edema: Secondary | ICD-10-CM | POA: Diagnosis not present

## 2024-08-05 DIAGNOSIS — R262 Difficulty in walking, not elsewhere classified: Secondary | ICD-10-CM

## 2024-08-05 DIAGNOSIS — M25661 Stiffness of right knee, not elsewhere classified: Secondary | ICD-10-CM | POA: Diagnosis not present

## 2024-08-05 NOTE — Therapy (Signed)
 " OUTPATIENT PHYSICAL THERAPY LOWER EXTREMITY TREATMENT   Patient Name: [name] MRN: [id number] DOB:January 23, 1966, 59 y.o., female Today's Date: 08/05/2024  END OF SESSION:  PT End of Session - 08/05/24 1517     Visit Number 18    Number of Visits 28    Date for Recertification  08/11/24    Authorization Type [employer] MEDICARE    PT Start Time 1515    PT Stop Time 1603    PT Time Calculation (min) 48 min    Activity Tolerance Patient tolerated treatment well;No increased pain;Patient limited by pain    Behavior During Therapy [facility] for tasks assessed/performed            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   Obesity 07/14/2012   Pneumonia    Polycystic disease, ovaries    Pre-diabetes    Reflux    Sinus tachycardia 07/14/2012   Pt says HR > 100 is consistent   Past Surgical History:  Procedure Laterality Date   COLONOSCOPY     FOOT SURGERY Left    Right plantar facsiitis scrape   KNEE CLOSED REDUCTION Right 06/16/2024   Procedure: MANIPULATION, KNEE, CLOSED;  Surgeon: Vernetta Lonni GRADE, MD;  Location: [facility];  Service: Orthopedics;  Laterality: Right;   KNEE SURGERY Bilateral 2007   Tendon release   KNEE SURGERY Right 04/05/2024   Total R Knee Replacement   LAPAROSCOPIC GASTRIC SLEEVE RESECTION N/A 03/30/2017   Procedure: LAPAROSCOPIC GASTRIC SLEEVE RESECTION, UPPER ENDO;  Surgeon: Tanda Locus, MD;  Location: [facility];  Service: General;  Laterality: N/A;   NASAL SINUS SURGERY  2004   OVARIAN CYST REMOVAL  1998    A cyst on the fallopian tube removed   SPINAL FUSION     TONSILLECTOMY  1996   TOTAL KNEE ARTHROPLASTY Right 04/05/2024   Procedure: ARTHROPLASTY, KNEE, TOTAL;  Surgeon: Vernetta Lonni GRADE, MD;  Location: [facility];  Service: Orthopedics;  Laterality: Right;   Patient Active Problem List   Diagnosis Date Noted   Arthrofibrosis of knee joint, right 06/15/2024   Status post total right knee replacement 04/05/2024   Actinic keratosis 01/18/2024   Absolute anemia 01/18/2024   Pruritus 01/18/2024   Bilateral carpal tunnel syndrome 11/25/2023   History of sleeve gastrectomy 11/25/2023   Generalized anxiety disorder with panic attacks 08/12/2023   Primary insomnia 08/12/2023   Chronic constipation 08/12/2023   Contusion of nose 04/23/2023   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   Chronic migraine without aura, with intractable migraine, so stated, with status migrainosus 01/25/2023   History of psychiatric treatment 01/13/2023   Viral upper respiratory tract infection 03/28/2021   Episodic tension-type headache, not intractable 11/07/2020   Hypertrophy of inferior nasal turbinate 11/07/2020   Postnasal drip 11/07/2020   Lymphadenopathy, anterior cervical 07/13/2020   Sebaceous cyst 07/13/2020   Iron  deficiency anemia secondary to inadequate dietary iron  intake  01/12/2020   Constipation 12/13/2019   Hyperlipidemia LDL goal <130 09/08/2019   Positive colorectal cancer screening using Cologuard test 03/02/2018   Leukocytosis 01/26/2018   Onychomycosis of great toe 12/22/2017   Family history of DVT 03/20/2017   Unilateral primary osteoarthritis, right knee 05/13/2016   Dyslipidemia 03/04/2016   Right knee pain 03/03/2016   Pre-diabetes 11/01/2015   Allergic rhinitis 05/28/2015   Sleep state misperception 07/05/2014   BMI 33.0-33.9,adult 05/24/2013   Polycystic ovary disease 07/14/2012   Obstructive sleep apnea (adult) (pediatric) 02/04/2012   Perimenopausal 11/10/2011   Hot flashes  11/10/2011   Disturbance in sleep behavior 09/16/2011   Asthma 05/23/2011   Backache 05/23/2011   Bipolar disorder in partial remission 05/23/2011   Dysphagia 05/23/2011   Esophageal reflux 05/23/2011   Rhinitis, allergic 05/23/2011    PCP: Camie Moats, PA-C  REFERRING PROVIDER: Lonni CINDERELLA Poli, MD  REFERRING DIAG:  [phone] (ICD-10-CM) - Status post total right knee replacement  Post-manipulation this morning  THERAPY DIAG:  Difficulty in walking, not elsewhere classified  Muscle weakness (generalized)  Localized edema  Chronic pain of right knee  Stiffness of right knee, not elsewhere classified  Rationale for Evaluation and Treatment: Rehabilitation  ONSET DATE: Manipulation 06/16/2024, TKA 04/05/2024  SUBJECTIVE:   SUBJECTIVE STATEMENT: Anelis has been participating with her HEP today.  Participation was limited the past 2 weeks due to illness.  PERTINENT HISTORY: OA, asthma, Bipolar, DDD L4-S1, headache, HTN, Lt leg neuropathy, obesity, pre-diabetes, plantar fascia surgery, bilateral knee surgeries, spinal fusion, bilateral carpal tunnel  [name] had her right knee replaced 04/05/2024.  She only had home health PT due to difficulty getting to outpatient PT.  Her manipulation was this morning and she is motivated to fix her right knee.  PAIN:  Are you having pain?  Yes: NPRS scale: Still 4-8/10 this week Pain location: Rt knee Pain description: Stiff, achy Aggravating factors: Bending, walking, in and out of the car Relieving factors: Ice, meds PRECAUTIONS: Back  RED FLAGS: None   WEIGHT BEARING RESTRICTIONS: No  FALLS:  Has patient fallen in last 6 months? No  LIVING ENVIRONMENT: Lives with: lives with their family, lives with their spouse, and mom Lives in: House/apartment Stairs: Difficulty with stairs Has following equipment at home: None  OCCUPATION: Disbailty  PLOF: Needs assistance with ADLs  PATIENT GOALS: Bend the knee  normally  NEXT MD VISIT: 09/28/2024  OBJECTIVE:  Note: Objective measures were completed at Evaluation unless otherwise noted.  DIAGNOSTIC FINDINGS: Right knee arthroplasty without immediate postoperative complication.   PATIENT SURVEYS:  PSFS: THE PATIENT SPECIFIC FUNCTIONAL SCALE  Place score of 0-10 (0 = unable to perform activity and 10 = able to perform activity at the same level as before injury or problem)  Activity Date: 06/16/2024 06/28/24 07/21/2023  ADLS including get in/out of a vehicle; clothes out of the dryer 1 x 3  6 7   2.  Sleep 0 x 2 1 2.5  3. Walk including on uneven ground, ramps, curbs  1 5  4. stairs  1 3  Total Score 0.06 2.25 4.38    Total Score = Sum of activity scores/number of activities  Minimally Detectable Change: 3 points (for single activity); 2 points (for average score)  [facility] (nd). The Patient Specific Functional Scale . Retrieved from [email or web address]   COGNITION: Overall cognitive status: Within functional limits for tasks assessed    SENSATION: No new tingling or paresthesias  EDEMA:  Noted and not  objectively assessed  LOWER EXTREMITY ROM:   AROM Left/Right  06/16/24 Right  06/17/24 Right  06/20/24 Right 06/23/24 Rt:  06/24/24 Rt  06/27/24 Right 06/28/24 Right 07/06/24  Right 07/08/2024 Right 07/14/2024 Right 07/20/2024 Right 07/22/2024 Right 08/04/2024 Right 08/05/2024  Hip flexion                Hip extension                Hip abduction                Hip adduction                Hip internal rotation                Hip external rotation                Knee flexion 131/70 (Right 95 post exercises) 98 with PT assist Supine  80A 95 P Supine  84 A Supine AROM 84deg Supine AAROM 72  Seated P: 84* Supine 74A Passive 82 Passive 88 Active 84 Passive 95   Active 88 Passive 98 Active 80 Passive 93 Active 89 Passive 96  Knee extension 0/-13 (Right -7  post-exercises) -7 active       -5 active -5 active - 5 Active -4 after exercise -4 after exercise Active -4  Ankle dorsiflexion                Ankle plantarflexion                Ankle inversion                Ankle eversion                 (Blank rows = not tested)  LOWER EXTREMITY STRENGTH:  MMT Left/Right  06/16/24   Hip flexion    Hip extension    Hip abduction    Hip adduction    Hip internal rotation    Hip external rotation    Knee flexion    Knee extension 4/2+   Ankle dorsiflexion    Ankle plantarflexion    Ankle inversion    Ankle eversion     (Blank rows = not tested)  GAIT: Distance walked: 50 feet Assistive device utilized: None Level of assistance: Complete Independence Comments: Araina has limited WB endurance and walks with a very stiff gait, unable to fully extend her right knee    TREATMENT DATE: Rt TKA 08/05/2024 Recumbent bike Seat 7 for 5 minutes active assisted range of motion, without a foot strap on the right Seated knee flexion AAROM (left pushes right into flexion) 2 sets of 12 x 10 seconds (All sets with PT assist) Quad sets with right heel prop 10 x 5 seconds   Manual: PT overpressure with all seated knee flexion activities Quad sets with PT overpressure 10 x 5 seconds  Functional Activities: Double Leg Press 125# 25 reps for 5 seconds (with bounce) the bend, full extension for sit to stand, stairs, curbs Single Leg Press 68# 15 reps for 5 seconds at the bend, full extension for sit to stand, stairs, curbs   08/04/2024 Recumbent bike Seat 7 for 5 minutes active assisted range of motion, without a foot strap on the right Seated knee flexion AAROM (left pushes right into flexion) 4 sets of 5 x 10 seconds (All sets with PT assist) Quad sets with right heel prop & PT overpressure 2 sets of 10  x 5 seconds  Manual: PT overpressure with seated knee flexion activities and light overpressure with quadriceps into extension  Functional  Activities: Double Leg Press 125# 25 reps for 5 seconds (with bounce) the bend, full extension for sit to stand, stairs, curbs Single Leg Press 68# 15 reps for 5 seconds at the bend, full extension for sit to stand, stairs, curbs   07/22/2024 Recumbent bike Seat 7 for 5 minutes active assisted range of motion, remove foot strap on the right Seated knee flexion AAROM (left pushes right into flexion) 2 sets of 10 x 10 seconds (All sets with PT assist) Quad sets with right heel prop & PT overpressure 20 x 5 seconds  Functional Activities: Double Leg Press 125# (150# next visit) 25 reps for 5 seconds (with bounce) the bend, full extension for sit to stand, stairs, curbs Single Leg Press 75# 15 reps for 5 seconds at the bend, full extension for sit to stand, stairs, curbs  Vaso Right Knee 10 minutes High 34* 10 minutes   HOME EXERCISE PROGRAM: Access Code: [email or web address] URL: [email or web address] Date: 06/29/2024 Prepared by: Grayce Spatz  Exercises - Seated Knee Flexion AAROM  - 10 x daily - 7 x weekly - 1 sets - 10 reps - 5 seconds hold - Supine Heel Slide with Strap  - 10 x daily - 7 x weekly - 1 sets - 10 reps - 5 seconds hold - Supine Quadricep Sets  - 10 x daily - 7 x weekly - 2 sets - 10 reps - 5 second hold - Seated Knee Extension AROM  - 10 x daily - 7 x weekly - 1 sets - 10 reps - 2 seconds hold - Standing Knee Flexion Stretch on Step  - 1 x daily - 7 x weekly - 3-5 sets - 3 reps - 10 seconds hold - Gastroc Stretch on Step  - 1-2 x daily - 7 x weekly - 1 sets - 3 reps - 30 seconds hold - Seated Table Hamstring Stretch  - 1-2 x daily - 7 x weekly - 1 sets - 3 reps - 30 seconds hold - Supine Quadriceps Stretch with Strap on Table  - 1-2 x daily - 7 x weekly - 1 sets - 3 reps - 30 seconds hold  ASSESSMENT:  CLINICAL IMPRESSION:  PROM is 0 - 4 - 96 degrees today, with flexion active range of motion at 89 degrees.  89 degrees is her best AROM measurement yet and is back  where she was before her illness 2 weeks ago.  We will reassess nest week with long-term goals of at least 0 - 3 -90 degrees of active range.  OBJECTIVE IMPAIRMENTS: Abnormal gait, decreased activity tolerance, decreased endurance, decreased mobility, difficulty walking, decreased ROM, decreased strength, increased edema, impaired perceived functional ability, and pain.   ACTIVITY LIMITATIONS: bending, sitting, standing, squatting, sleeping, stairs, dressing, and locomotion level  PARTICIPATION LIMITATIONS: cleaning, driving, and community activity  PERSONAL FACTORS: OA, asthma, Bipolar, DDD L4-S1, headache, HTN, Lt leg neuropathy, obesity, pre-diabetes, plantar fascia surgery, bilateral knee surgeries, spinal fusion, bilateral carpal tunnel are also affecting patient's functional outcome.   REHAB POTENTIAL: Good  CLINICAL DECISION MAKING: Evolving/moderate complexity  EVALUATION COMPLEXITY: Moderate   GOALS: Goals reviewed with patient? Yes  SHORT TERM GOALS: Target date: 07/14/2024 Arnell will be independent with her day 1 HEP Baseline: Started 06/16/2024 Goal status: Met 07/08/2024  2.  Improve right knee AROM to 0 - 5 - 95 degrees Baseline: 0 -  13 - 70 degrees Goal status:   Met 07/20/2024  3.  Improve right quadriceps strength as assessed by functional self-report Baseline: 2+/5 Goal status: Ongoing  06/28/2024   LONG TERM GOALS: Target date: 08/11/2024  Improve Patient Specific Functional Score to 5 Baseline: 0.6 Goal status: Ongoing  07/20/2024  2.  Haruye will report right knee pain consistently 0-3/10 on the Visual Analog Scale Baseline: 7-10/10 Goal status: Ongoing  08/05/2024  3.  Improve right knee AROM to 0 - 2 - 110 degrees Baseline: 0 - 13 - 70 degrees Goal status: Ongoing  08/05/2024  4.  Improve right quadriceps strength to 4/5 MMT Baseline: 2+/5 MMT Goal status: Ongoing  06/28/2024  5.  Christe will be able to ambulate in the community without a noticeable limp  due to her range deficit Baseline: Limited by a 13 degree extension deficit Goal status: Ongoing 08/05/2024  6.  Mateya will be independent with her long-term maintenance HEP at [place] Baseline: Started 06/16/2024 Goal status: Ongoing 08/05/2024   PLAN:  PT FREQUENCY: 3 x a week  PT DURATION: Up to 08/11/2024  PLANNED INTERVENTIONS: 97750- Physical Performance Testing, 97110-Therapeutic exercises, 97530- Therapeutic activity, 97112- Neuromuscular re-education, 97535- Self Care, 02859- Manual therapy, [id number]- Gait training, [phone]- Electrical stimulation (unattended), 97016- Vasopneumatic device, 20560 (1-2 muscles), 20561 (3+ muscles)- Dry Needling, Patient/Family education, Balance training, Stair training, Joint mobilization, and Cryotherapy  PLAN FOR NEXT SESSION: Reassess, PSFS, more PT if progress continues.  Independent rehabilitation if no changes.    Myer LELON Ivory, PT, MPT 08/05/2024, 4:19 PM  "

## 2024-08-07 ENCOUNTER — Encounter: Payer: Self-pay | Admitting: Hematology and Oncology

## 2024-08-09 ENCOUNTER — Encounter: Payer: Self-pay | Admitting: Hematology and Oncology

## 2024-08-10 ENCOUNTER — Ambulatory Visit: Payer: Medicare (Managed Care) | Admitting: Rehabilitative and Restorative Service Providers"

## 2024-08-10 ENCOUNTER — Encounter: Payer: Self-pay | Admitting: Rehabilitative and Restorative Service Providers"

## 2024-08-10 DIAGNOSIS — R262 Difficulty in walking, not elsewhere classified: Secondary | ICD-10-CM | POA: Diagnosis not present

## 2024-08-10 DIAGNOSIS — M6281 Muscle weakness (generalized): Secondary | ICD-10-CM | POA: Diagnosis not present

## 2024-08-10 DIAGNOSIS — M25661 Stiffness of right knee, not elsewhere classified: Secondary | ICD-10-CM | POA: Diagnosis not present

## 2024-08-10 DIAGNOSIS — R600 Localized edema: Secondary | ICD-10-CM | POA: Diagnosis not present

## 2024-08-10 DIAGNOSIS — M25561 Pain in right knee: Secondary | ICD-10-CM

## 2024-08-10 DIAGNOSIS — G8929 Other chronic pain: Secondary | ICD-10-CM | POA: Diagnosis not present

## 2024-08-10 NOTE — Therapy (Signed)
 " OUTPATIENT PHYSICAL THERAPY LOWER EXTREMITY TREATMENT/PROGRESS NOTE  Progress Note Reporting Period 06/16/2024 to 08/10/2024  See note below for Objective Data and Assessment of Progress/Goals.    Patient Name: [name] MRN: [id number] DOB:1966/05/21, 59 y.o., female Today's Date: 08/10/2024  END OF SESSION:  PT End of Session - 08/10/24 1614     Visit Number 19    Number of Visits 28    Date for Recertification  09/08/24    Authorization Type [employer] MEDICARE    Progress Note Due on Visit 19    PT Start Time 1612    PT Stop Time 1655    PT Time Calculation (min) 43 min    Activity Tolerance Patient tolerated treatment well;No increased pain;Patient limited by pain    Behavior During Therapy [facility] for tasks assessed/performed          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   Obesity 07/14/2012   Pneumonia    Polycystic disease, ovaries    Pre-diabetes    Reflux    Sinus tachycardia 07/14/2012   Pt says HR > 100 is consistent   Past Surgical History:  Procedure Laterality Date   COLONOSCOPY     FOOT SURGERY Left    Right plantar facsiitis scrape   KNEE CLOSED REDUCTION Right 06/16/2024   Procedure: MANIPULATION, KNEE, CLOSED;  Surgeon: Vernetta Lonni GRADE, MD;  Location: [facility];  Service: Orthopedics;  Laterality: Right;   KNEE SURGERY Bilateral 2007   Tendon release   KNEE SURGERY Right 04/05/2024   Total R Knee Replacement   LAPAROSCOPIC GASTRIC SLEEVE RESECTION N/A 03/30/2017   Procedure: LAPAROSCOPIC GASTRIC  SLEEVE RESECTION, UPPER ENDO;  Surgeon: Tanda Locus, MD;  Location: [facility];  Service: General;  Laterality: N/A;   NASAL SINUS SURGERY  2004   OVARIAN CYST REMOVAL  1998   A cyst on the fallopian tube removed   SPINAL FUSION     TONSILLECTOMY  1996   TOTAL KNEE ARTHROPLASTY Right 04/05/2024   Procedure: ARTHROPLASTY, KNEE, TOTAL;  Surgeon: Vernetta Lonni GRADE, MD;  Location: [facility];  Service: Orthopedics;  Laterality: Right;   Patient Active Problem List   Diagnosis Date Noted   Arthrofibrosis of knee joint, right 06/15/2024   Status post total right knee replacement 04/05/2024   Actinic keratosis 01/18/2024   Absolute anemia 01/18/2024   Pruritus 01/18/2024   Bilateral carpal tunnel syndrome 11/25/2023   History of sleeve gastrectomy 11/25/2023   Generalized anxiety disorder with panic attacks 08/12/2023   Primary insomnia 08/12/2023   Chronic constipation 08/12/2023   Contusion of nose 04/23/2023   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   Chronic migraine without aura, with intractable migraine, so stated, with status migrainosus 01/25/2023   History of psychiatric treatment 01/13/2023   Viral upper respiratory tract infection 03/28/2021   Episodic tension-type headache, not intractable 11/07/2020   Hypertrophy of inferior nasal turbinate  11/07/2020   Postnasal drip 11/07/2020   Lymphadenopathy, anterior cervical 07/13/2020   Sebaceous cyst 07/13/2020   Iron  deficiency anemia secondary to inadequate dietary iron  intake 01/12/2020   Constipation 12/13/2019   Hyperlipidemia LDL goal <130 09/08/2019   Positive colorectal cancer screening using Cologuard test 03/02/2018   Leukocytosis 01/26/2018   Onychomycosis of great toe 12/22/2017   Family history of DVT 03/20/2017   Unilateral primary osteoarthritis, right knee 05/13/2016   Dyslipidemia 03/04/2016   Right knee pain 03/03/2016   Pre-diabetes 11/01/2015   Allergic rhinitis 05/28/2015   Sleep state misperception 07/05/2014   BMI  33.0-33.9,adult 05/24/2013   Polycystic ovary disease 07/14/2012   Obstructive sleep apnea (adult) (pediatric) 02/04/2012   Perimenopausal 11/10/2011   Hot flashes 11/10/2011   Disturbance in sleep behavior 09/16/2011   Asthma 05/23/2011   Backache 05/23/2011   Bipolar disorder in partial remission 05/23/2011   Dysphagia 05/23/2011   Esophageal reflux 05/23/2011   Rhinitis, allergic 05/23/2011    PCP: Camie Moats, PA-C  REFERRING PROVIDER: Lonni CINDERELLA Poli, MD  REFERRING DIAG:  [phone] (ICD-10-CM) - Status post total right knee replacement  Post-manipulation this morning  THERAPY DIAG:  Difficulty in walking, not elsewhere classified - Plan: PT plan of care cert/re-cert  Muscle weakness (generalized) - Plan: PT plan of care cert/re-cert  Localized edema - Plan: PT plan of care cert/re-cert  Chronic pain of right knee - Plan: PT plan of care cert/re-cert  Stiffness of right knee, not elsewhere classified - Plan: PT plan of care cert/re-cert  Rationale for Evaluation and Treatment: Rehabilitation  ONSET DATE: Manipulation 06/16/2024, TKA 04/05/2024  SUBJECTIVE:   SUBJECTIVE STATEMENT: Varie notes continued HEP participation.  She continues to give great effort in the office and she reports and demonstrates good home exercise program participation.  PERTINENT HISTORY: OA, asthma, Bipolar, DDD L4-S1, headache, HTN, Lt leg neuropathy, obesity, pre-diabetes, plantar fascia surgery, bilateral knee surgeries, spinal fusion, bilateral carpal tunnel  Neri had her right knee replaced 04/05/2024.  She only had home health PT due to difficulty getting to outpatient PT.  Her manipulation was this morning and she is motivated to fix her right knee.  PAIN:  Are you having pain?  Yes: NPRS scale: 5-9/10 this week Pain location: Rt knee Pain description: Stiff, achy Aggravating factors: Bending, walking, in and out of the car (getting better), sleeping Relieving factors: Ice,  meds  PRECAUTIONS: Back  RED FLAGS: None   WEIGHT BEARING RESTRICTIONS: No  FALLS:  Has patient fallen in last 6 months? No  LIVING ENVIRONMENT: Lives with: lives with their family, lives with their spouse, and mom Lives in: House/apartment Stairs: Difficulty with stairs Has following equipment at home: None  OCCUPATION: Disbailty  PLOF: Needs assistance with ADLs  PATIENT GOALS: Bend the knee normally  NEXT MD VISIT: 09/28/2024  OBJECTIVE:  Note: Objective measures were completed at Evaluation unless otherwise noted.  DIAGNOSTIC FINDINGS: Right knee arthroplasty without immediate postoperative complication.   PATIENT SURVEYS:  PSFS: THE PATIENT SPECIFIC FUNCTIONAL SCALE  Place score of 0-10 (0 = unable to perform activity and 10 = able to perform activity at the same level as before injury or problem)  Activity Date: 06/16/2024 06/28/24 07/21/2023 08/10/2024  ADLS including get in/out of a vehicle; clothes out of the dryer 1 x 3  6 7  6.5  2.  Sleep 0 x 2 1 2.5 4  3. Walk including on uneven ground, ramps, curbs  1 5 6   4. stairs  1 3 4   Total Score 0.06 2.25 4.38 5.13    Total Score = Sum of activity scores/number of activities  Minimally Detectable Change: 3 points (for single activity); 2 points (for average score)  [facility] (nd). The Patient Specific Functional Scale . Retrieved from [email or web address].[email or web address]   COGNITION: Overall cognitive status: Within functional limits for tasks assessed    SENSATION: No new tingling or paresthesias  EDEMA:  Noted and not objectively assessed  LOWER EXTREMITY ROM:   AROM Left/Right  06/16/24 Right  06/17/24 Right  06/20/24 Right 06/23/24 Rt:  06/24/24 Rt  06/27/24 Right 06/28/24 Right 07/06/24  Right 07/08/2024 Right 07/14/2024 Right 07/20/2024 Right 07/22/2024 Right 08/04/2024 Right 08/05/2024 Right 08/10/2024  Hip flexion                 Hip extension                  Hip abduction                 Hip adduction                 Hip internal rotation                 Hip external rotation                 Knee flexion 131/70 (Right 95 post exercises) 98 with PT assist Supine  80A 95 P Supine  84 A Supine AROM 84deg Supine AAROM 72  Seated P: 84* Supine 74A Passive 82 Passive 88 Active 84 Passive 95   Active 88 Passive 98 Active 80 Passive 93 Active 89 Passive 96 Active 90  Passive 97  Knee extension 0/-13 (Right -7 post-exercises) -7 active       -5 active -5 active - 5 Active -4 after exercise -4 after exercise Active -4 Active -4  Ankle dorsiflexion                 Ankle plantarflexion                 Ankle inversion                 Ankle eversion                  (Blank rows = not tested)  LOWER EXTREMITY STRENGTH:  MMT Left/Right  06/16/24 Left/Right  08/10/2024  Hip flexion    Hip extension    Hip abduction    Hip adduction    Hip internal rotation    Hip external rotation    Knee flexion    Knee extension 4/2+ 65.7/56.6 pounds  Ankle dorsiflexion    Ankle plantarflexion    Ankle inversion    Ankle eversion     (Blank rows = not tested)  GAIT: Distance walked: 50 feet Assistive device utilized: None Level of assistance: Complete Independence Comments: Ketzia has limited WB endurance and walks with a very stiff gait, unable to fully extend her right knee    TREATMENT DATE: Rt TKA 08/10/2024 -Recumbent bike Seat 7 for 5 minutes active assisted range of motion, without a foot strap on the right -Seated knee flexion AAROM (left pushes right into flexion) 12 x 10 seconds (All sets with PT assist) -Quad sets with right heel prop 20 x 5 seconds   Manual: -PT overpressure with all seated knee flexion activities -Quad sets with PT overpressure 20 x 5 seconds  Functional Activities:  Double Leg Press 125# 25 reps for 5 seconds (with bounce) the bend, full extension for sit to stand, stairs, curbs Single Leg Press 75# 15 reps  for 5 seconds at the bend, full extension for sit to stand, stairs, curbs    08/05/2024 Recumbent bike Seat 7 for 5 minutes active assisted range of motion, without a foot strap on the right Seated knee flexion AAROM (left pushes right into flexion) 2 sets of 12 x 10 seconds (All sets with PT assist) Quad sets with right heel prop 10 x 5 seconds   Manual: PT overpressure with all seated knee flexion activities Quad sets with PT overpressure 10 x 5 seconds  Functional Activities: Double Leg Press 125# 25 reps for 5 seconds (with bounce) the bend, full extension for sit to stand, stairs, curbs Single Leg Press 68# 15 reps for 5 seconds at the bend, full extension for sit to stand, stairs, curbs   08/04/2024 Recumbent bike Seat 7 for 5 minutes active assisted range of motion, without a foot strap on the right Seated knee flexion AAROM (left pushes right into flexion) 4 sets of 5 x 10 seconds (All sets with PT assist) Quad sets with right heel prop & PT overpressure 2 sets of 10 x 5 seconds  Manual: PT overpressure with seated knee flexion activities and light overpressure with quadriceps into extension  Functional Activities: Double Leg Press 125# 25 reps for 5 seconds (with bounce) the bend, full extension for sit to stand, stairs, curbs Single Leg Press 68# 15 reps for 5 seconds at the bend, full extension for sit to stand, stairs, curbs   HOME EXERCISE PROGRAM: Access Code: [email or web address] URL: [email or web address] Date: 06/29/2024 Prepared by: Grayce Spatz  Exercises - Seated Knee Flexion AAROM  - 10 x daily - 7 x weekly - 1 sets - 10 reps - 5 seconds hold - Supine Heel Slide with Strap  - 10 x daily - 7 x weekly - 1 sets - 10 reps - 5 seconds hold - Supine Quadricep Sets  - 10 x daily - 7 x weekly - 2 sets - 10 reps - 5 second hold - Seated Knee Extension AROM  - 10 x daily - 7 x weekly - 1 sets - 10 reps - 2 seconds hold - Standing Knee Flexion Stretch on Step  - 1  x daily - 7 x weekly - 3-5 sets - 3 reps - 10 seconds hold - Gastroc Stretch on Step  - 1-2 x daily - 7 x weekly - 1 sets - 3 reps - 30 seconds hold - Seated Table Hamstring Stretch  - 1-2 x daily - 7 x weekly - 1 sets - 3 reps - 30 seconds hold - Supine Quadriceps Stretch with Strap on Table  - 1-2 x daily - 7 x weekly - 1 sets - 3 reps - 30 seconds hold  ASSESSMENT:  CLINICAL IMPRESSION:  PROM is 0 - 4 - 97 degrees today, with flexion active range of motion at 90 degrees.  90 degrees is her best AROM measurement yet and I anticipate she will continue to improve given that she is less than 2 months post-manipulation.  Long-term active range of motion of lacking 0 to 3 degrees of extension and 95 to 100 degrees of active flexion range of motion is a realistic expectation for Kindle.she has some concerns about her out-of-pocket costs with her insurance and we discussed her options as far as continued supervised physical therapy versus transition  into a gym program where she will continue to focus on active range of motion, particularly flexion.  OBJECTIVE IMPAIRMENTS: Abnormal gait, decreased activity tolerance, decreased endurance, decreased mobility, difficulty walking, decreased ROM, decreased strength, increased edema, impaired perceived functional ability, and pain.   ACTIVITY LIMITATIONS: bending, sitting, standing, squatting, sleeping, stairs, dressing, and locomotion level  PARTICIPATION LIMITATIONS: cleaning, driving, and community activity  PERSONAL FACTORS: OA, asthma, Bipolar, DDD L4-S1, headache, HTN, Lt leg neuropathy, obesity, pre-diabetes, plantar fascia surgery, bilateral knee surgeries, spinal fusion, bilateral carpal tunnel are also affecting patient's functional outcome.   REHAB POTENTIAL: Good  CLINICAL DECISION MAKING: Evolving/moderate complexity  EVALUATION COMPLEXITY: Moderate   GOALS: Goals reviewed with patient? Yes  SHORT TERM GOALS: Target date: 07/14/2024 Amrie  will be independent with her day 1 HEP Baseline: Started 06/16/2024 Goal status: Met 07/08/2024  2.  Improve right knee AROM to 0 - 5 - 95 degrees Baseline: 0 - 13 - 70 degrees Goal status:   Met 07/20/2024  3.  Improve right quadriceps strength as assessed by functional self-report Baseline: 2+/5 Goal status: Met 08/10/2024   LONG TERM GOALS: Target date: 09/08/2024  Improve Patient Specific Functional Score to 5 Baseline: 0.6 Goal status: Met 08/10/2024  2.  Kaisyn will report right knee pain consistently 0-3/10 on the Visual Analog Scale Baseline: 7-10/10 Goal status: Ongoing 08/10/2024  3.  Improve right knee AROM to 0 - 2 - 110 degrees Baseline: 0 - 13 - 70 degrees Goal status: Ongoing 08/10/2024  4.  Improve right quadriceps strength to 4/5 MMT Baseline: 2+/5 MMT Goal status: Met 08/10/2024  5.  Kathrene will be able to ambulate in the community without a noticeable limp due to her range deficit Baseline: Limited by a 13 degree extension deficit Goal status: Ongoing 08/10/2024  6.  Tonyia will be independent with her long-term maintenance HEP at [place] Baseline: Started 06/16/2024 Goal status: Ongoing 08/10/2024   PLAN:  PT FREQUENCY: 1-2 x a week  PT DURATION: Up to 09/08/2024  PLANNED INTERVENTIONS: 97750- Physical Performance Testing, 97110-Therapeutic exercises, 97530- Therapeutic activity, 97112- Neuromuscular re-education, 97535- Self Care, [id number]- Manual therapy, [id number]- Gait training, [phone]- Electrical stimulation (unattended), [id number]- Vasopneumatic device, 20560 (1-2 muscles), 20561 (3+ muscles)- Dry Needling, Patient/Family education, Balance training, Stair training, Joint mobilization, and Cryotherapy  PLAN FOR NEXT SESSION: Continue to focus on active range of motion for flexion and extension.  Prepare for transition into independent rehabilitation by 09/08/2024.   Myer LELON Ivory, PT, MPT 08/10/2024, 6:15 PM  "

## 2024-08-11 ENCOUNTER — Encounter: Payer: Medicare (Managed Care) | Admitting: Rehabilitative and Restorative Service Providers"

## 2024-08-12 ENCOUNTER — Encounter: Payer: Medicare (Managed Care) | Admitting: Rehabilitative and Restorative Service Providers"

## 2024-09-28 ENCOUNTER — Ambulatory Visit: Admitting: Orthopaedic Surgery

## 2024-10-24 ENCOUNTER — Ambulatory Visit: Admitting: Hematology and Oncology

## 2024-10-24 ENCOUNTER — Other Ambulatory Visit

## 2024-12-20 ENCOUNTER — Telehealth: Admitting: Adult Health

## 2025-01-25 ENCOUNTER — Ambulatory Visit: Payer: Medicare (Managed Care) | Admitting: Physician Assistant
